# Patient Record
Sex: Female | Born: 1945
Health system: Southern US, Community
[De-identification: ages and names within clinical notes are randomized; demographics above are authoritative.]

## PROBLEM LIST (undated history)

## (undated) DIAGNOSIS — D649 Anemia, unspecified: Secondary | ICD-10-CM

## (undated) DIAGNOSIS — H409 Unspecified glaucoma: Secondary | ICD-10-CM

## (undated) DIAGNOSIS — R519 Headache, unspecified: Secondary | ICD-10-CM

## (undated) DIAGNOSIS — Z87442 Personal history of urinary calculi: Secondary | ICD-10-CM

## (undated) DIAGNOSIS — K219 Gastro-esophageal reflux disease without esophagitis: Secondary | ICD-10-CM

## (undated) DIAGNOSIS — H547 Unspecified visual loss: Secondary | ICD-10-CM

## (undated) DIAGNOSIS — J189 Pneumonia, unspecified organism: Secondary | ICD-10-CM

## (undated) DIAGNOSIS — G35 Multiple sclerosis: Secondary | ICD-10-CM

## (undated) DIAGNOSIS — I499 Cardiac arrhythmia, unspecified: Secondary | ICD-10-CM

## (undated) DIAGNOSIS — R21 Rash and other nonspecific skin eruption: Secondary | ICD-10-CM

## (undated) HISTORY — PX: CATARACT EXTRACTION: SUR2

## (undated) HISTORY — PX: ABDOMINAL HYSTERECTOMY: SHX81

## (undated) HISTORY — PX: KIDNEY STONE SURGERY: SHX686

---

## 1999-03-11 ENCOUNTER — Encounter: Admission: RE | Admit: 1999-03-11 | Discharge: 1999-04-22 | Payer: Self-pay | Admitting: Neurology

## 1999-11-19 ENCOUNTER — Encounter: Payer: Self-pay | Admitting: Obstetrics and Gynecology

## 1999-11-19 ENCOUNTER — Encounter: Admission: RE | Admit: 1999-11-19 | Discharge: 1999-11-19 | Payer: Self-pay | Admitting: Obstetrics and Gynecology

## 2002-02-27 ENCOUNTER — Encounter: Payer: Self-pay | Admitting: Obstetrics and Gynecology

## 2002-02-27 ENCOUNTER — Encounter: Admission: RE | Admit: 2002-02-27 | Discharge: 2002-02-27 | Payer: Self-pay | Admitting: Obstetrics and Gynecology

## 2003-08-20 ENCOUNTER — Encounter: Admission: RE | Admit: 2003-08-20 | Discharge: 2003-08-20 | Payer: Self-pay | Admitting: Internal Medicine

## 2008-03-12 ENCOUNTER — Encounter: Admission: RE | Admit: 2008-03-12 | Discharge: 2008-06-11 | Payer: Self-pay | Admitting: Internal Medicine

## 2009-05-15 ENCOUNTER — Encounter: Admission: RE | Admit: 2009-05-15 | Discharge: 2009-05-15 | Payer: Self-pay | Admitting: Internal Medicine

## 2009-05-16 ENCOUNTER — Encounter: Admission: RE | Admit: 2009-05-16 | Discharge: 2009-05-16 | Payer: Self-pay | Admitting: Internal Medicine

## 2011-07-03 ENCOUNTER — Emergency Department (HOSPITAL_BASED_OUTPATIENT_CLINIC_OR_DEPARTMENT_OTHER)
Admission: EM | Admit: 2011-07-03 | Discharge: 2011-07-03 | Disposition: A | Discharge status: Home / Self Care | Payer: Medicare Other | Attending: Emergency Medicine | Admitting: Emergency Medicine

## 2011-07-03 ENCOUNTER — Emergency Department (INDEPENDENT_AMBULATORY_CARE_PROVIDER_SITE_OTHER): Payer: Medicare Other

## 2011-07-03 ENCOUNTER — Other Ambulatory Visit: Payer: Self-pay

## 2011-07-03 DIAGNOSIS — G35 Multiple sclerosis: Secondary | ICD-10-CM | POA: Insufficient documentation

## 2011-07-03 DIAGNOSIS — M810 Age-related osteoporosis without current pathological fracture: Secondary | ICD-10-CM | POA: Insufficient documentation

## 2011-07-03 DIAGNOSIS — R079 Chest pain, unspecified: Secondary | ICD-10-CM

## 2011-07-03 DIAGNOSIS — I200 Unstable angina: Secondary | ICD-10-CM | POA: Insufficient documentation

## 2011-07-03 DIAGNOSIS — K219 Gastro-esophageal reflux disease without esophagitis: Secondary | ICD-10-CM | POA: Insufficient documentation

## 2011-07-03 HISTORY — DX: Multiple sclerosis: G35

## 2011-07-03 HISTORY — DX: Gastro-esophageal reflux disease without esophagitis: K21.9

## 2011-07-03 LAB — COMPREHENSIVE METABOLIC PANEL
AST: 20 U/L (ref 0–37)
BUN: 13 mg/dL (ref 6–23)
CO2: 29 mEq/L (ref 19–32)
Calcium: 9.6 mg/dL (ref 8.4–10.5)
Chloride: 105 mEq/L (ref 96–112)
Creatinine, Ser: 0.6 mg/dL (ref 0.50–1.10)
GFR calc Af Amer: >90 mL/min (ref >90)
GFR calc non Af Amer: >90 mL/min (ref >90)
Total Bilirubin: 0.2 mg/dL — ABNORMAL LOW (ref 0.3–1.2)

## 2011-07-03 LAB — CBC
HCT: 38.4 % (ref 36.0–46.0)
MCH: 27.8 pg (ref 26.0–34.0)
MCV: 84.6 fL (ref 78.0–100.0)
Platelets: 182 10*3/uL (ref 150–400)
RBC: 4.54 MIL/uL (ref 3.87–5.11)
WBC: 4.7 10*3/uL (ref 4.0–10.5)

## 2011-07-03 LAB — CARDIAC PANEL(CRET KIN+CKTOT+MB+TROPI)
CK, MB: 6.6 ng/mL (ref 0.3–4.0)
Total CK: 221 U/L — ABNORMAL HIGH (ref 7–177)

## 2011-07-03 MED ORDER — NITROGLYCERIN 2 % TD OINT
1.0000 [in_us] | TOPICAL_OINTMENT | Freq: Four times a day (QID) | TRANSDERMAL | Status: DC
  Administered 2011-07-03: 1 [in_us] via TOPICAL
  Filled 2011-07-03: qty 1

## 2011-07-03 MED ORDER — HEPARIN BOLUS VIA INFUSION
4000.0000 [IU] | Freq: Once | INTRAVENOUS | Status: AC
  Administered 2011-07-03: 4000 [IU] via INTRAVENOUS
  Filled 2011-07-03: qty 4000

## 2011-07-03 MED ORDER — HEPARIN (PORCINE) IN NACL 100-0.45 UNIT/ML-% IJ SOLN
14.0000 [IU]/kg/h | INTRAMUSCULAR | Status: DC
  Administered 2011-07-03: 14 [IU]/kg/h via INTRAVENOUS
  Filled 2011-07-03: qty 250

## 2011-07-03 NOTE — ED Notes (Signed)
[facility] here to transport pt to [facility]

## 2011-07-03 NOTE — ED Provider Notes (Signed)
History     CSN: [id number] Arrival date & time: 07/03/2011  5:41 PM   First MD Initiated Contact with Patient 07/03/11 1756      Chief Complaint  Patient presents with  . Chest Pain    (Consider location/radiation/quality/duration/timing/severity/associated sxs/prior treatment) Patient is a 65 y.o. female presenting with chest pain. The history is provided by the patient.  Chest Pain The chest pain began yesterday. Chest pain occurs intermittently. The chest pain is worsening. The pain is associated with exertion. The severity of the pain is moderate. The quality of the pain is described as dull, tightness and heavy. The pain radiates to the left arm and right jaw. Primary symptoms include shortness of breath and nausea. Pertinent negatives for primary symptoms include no fever, no fatigue, no cough, no abdominal pain, no vomiting and no dizziness.  Pertinent negatives for associated symptoms include no diaphoresis, no lower extremity edema, no numbness and no weakness. She tried nothing for the symptoms.  Pertinent negatives for past medical history include no aneurysm, no CAD and no MI.  Procedure history is positive for stress thallium. Procedure history comments: several years ago.     Past Medical History  Diagnosis Date  . Multiple sclerosis   . Osteoporosis   . GERD (gastroesophageal reflux disease)     Past Surgical History  Procedure Date  . Abdominal hysterectomy     No family history on file.  History  Substance Use Topics  . Smoking status: Never Smoker   . Smokeless tobacco: Not on file  . Alcohol Use:     OB History    Grav Para Term Preterm Abortions TAB SAB Ect Mult Living                  Review of Systems  Constitutional: Negative for fever, chills, diaphoresis and fatigue.  HENT: Negative for congestion, rhinorrhea and sneezing.   Eyes: Negative.   Respiratory: Positive for shortness of breath. Negative for cough and chest tightness.     Cardiovascular: Positive for chest pain. Negative for leg swelling.  Gastrointestinal: Positive for nausea. Negative for vomiting, abdominal pain, diarrhea and blood in stool.  Genitourinary: Negative for frequency, hematuria, flank pain and difficulty urinating.  Musculoskeletal: Negative for back pain and arthralgias.  Skin: Negative for rash.  Neurological: Negative for dizziness, speech difficulty, weakness, numbness and headaches.    Allergies  Other and Epinephrine  Home Medications   Current Outpatient Rx  Name Route Sig Dispense Refill  . VITAMIN D 1000 UNITS PO TABS Oral Take 1,000 Units by mouth daily.      . COD LIVER OIL PO Oral Take 1 tablet by mouth daily.      . OXYBUTYNIN CHLORIDE 5 MG PO TABS Oral Take 5 mg by mouth daily.      Marland Kitchen PREDNISOLONE ACETATE 1 % OP SUSP Right Eye Place 1 drop into the right eye 4 (four) times daily.      Marland Kitchen VITAMIN C 500 MG PO TABS Oral Take 500 mg by mouth daily.      Marland Kitchen VITAMIN E 400 UNITS PO CAPS Oral Take 400 Units by mouth daily.        BP 138/68  Pulse 76  Temp(Src) 98 F (36.7 C) (Oral)  Resp 20  Ht 5\' 3"  (1.6 m)  Wt 157 lb (71.215 kg)  BMI 27.81 kg/m2  SpO2 100%  Physical Exam  Constitutional: She is oriented to person, place, and time. She appears well-developed and  well-nourished.  HENT:  Head: Normocephalic and atraumatic.  Eyes: Pupils are equal, round, and reactive to light.  Neck: Normal range of motion. Neck supple.  Cardiovascular: Normal rate, regular rhythm and normal heart sounds.   Pulmonary/Chest: Effort normal and breath sounds normal. No respiratory distress. She has no wheezes. She has no rales. She exhibits no tenderness.  Abdominal: Soft. Bowel sounds are normal. There is no tenderness. There is no rebound and no guarding.  Musculoskeletal: Normal range of motion. She exhibits edema. She exhibits no tenderness.       Mild bilateral pitting edema, at baseline per pt  Lymphadenopathy:    She has no  cervical adenopathy.  Neurological: She is alert and oriented to person, place, and time.  Skin: Skin is warm and dry. No rash noted.  Psychiatric: She has a normal mood and affect.    ED Course  Procedures (including critical care time)  Results for orders placed during the hospital encounter of 07/03/11  CARDIAC PANEL(CRET KIN+CKTOT+MB+TROPI)      Component Value Range   Total CK 221 (*) 7 - 177 (U/L)   CK, MB 6.6 (*) 0.3 - 4.0 (ng/mL)   Troponin I <0.30  <0.30 (ng/mL)   Relative Index 3.0 (*) 0.0 - 2.5   CBC      Component Value Range   WBC 4.7  4.0 - 10.5 (K/uL)   RBC 4.54  3.87 - 5.11 (MIL/uL)   Hemoglobin 12.6  12.0 - 15.0 (g/dL)   HCT 11.9  14.7 - 82.9 (%)   MCV 84.6  78.0 - 100.0 (fL)   MCH 27.8  26.0 - 34.0 (pg)   MCHC 32.8  30.0 - 36.0 (g/dL)   RDW 56.2  13.0 - 86.5 (%)   Platelets 182  150 - 400 (K/uL)  COMPREHENSIVE METABOLIC PANEL      Component Value Range   Sodium 140  135 - 145 (mEq/L)   Potassium 4.1  3.5 - 5.1 (mEq/L)   Chloride 105  96 - 112 (mEq/L)   CO2 29  19 - 32 (mEq/L)   Glucose, Bld 85  70 - 99 (mg/dL)   BUN 13  6 - 23 (mg/dL)   Creatinine, Ser 7.84  0.50 - 1.10 (mg/dL)   Calcium 9.6  8.4 - 69.6 (mg/dL)   Total Protein 7.8  6.0 - 8.3 (g/dL)   Albumin 3.7  3.5 - 5.2 (g/dL)   AST 20  0 - 37 (U/L)   ALT 13  0 - 35 (U/L)   Alkaline Phosphatase 99  39 - 117 (U/L)   Total Bilirubin 0.2 (*) 0.3 - 1.2 (mg/dL)   GFR calc non Af Amer >90  >90 (mL/min)   GFR calc Af Amer >90  >90 (mL/min)   Dg Chest Portable 1 View  07/03/2011  *RADIOLOGY REPORT*  Clinical Data: Chest pain  PORTABLE CHEST - 1 VIEW  Comparison: None.  Findings: Normal mediastinum and cardiac silhouette.  Hiatal hernia present.  No effusion, infiltrate, or pneumothorax.  IMPRESSION: No acute cardiopulmonary process.  Original Report Authenticated By: Genevive Bi, M.D.      1. Unstable angina      Date: 07/03/2011  Rate: 70  Rhythm: normal sinus rhythm  QRS Axis: normal   Intervals: normal  ST/T Wave abnormalities: ST depressions inferiorly, very mild  Conduction Disutrbances:none  Narrative Interpretation:   Old EKG Reviewed: none available    MDM  Pt with chest pain, minor EKG changes, elevated CK, but normal  Tropinin.  Concerning for ACS.  Nothing to suggest PE, aortic injury.   Pt given nitrates, had asa 325mg  about one hour PTA.  Is pain free now. Spoke with cardiologist on call for [facility], will discuss admission to [facility] with hospitalist there.  Pt prefers to go to [facility].  Spoke with Dr. Altha Harm at [facility] who has accept pt for transfer  Rolan Bucco, MD 07/03/11 2015

## 2011-07-03 NOTE — ED Notes (Signed)
C/o CP intermittent since yesterday

## 2012-11-04 ENCOUNTER — Inpatient Hospital Stay (HOSPITAL_BASED_OUTPATIENT_CLINIC_OR_DEPARTMENT_OTHER)
Admission: EM | Admit: 2012-11-04 | Discharge: 2012-11-09 | DRG: 194 | Disposition: A | Discharge status: Skilled Nursing Facility | Payer: Medicare Other | Attending: Internal Medicine | Admitting: Internal Medicine

## 2012-11-04 ENCOUNTER — Emergency Department (HOSPITAL_BASED_OUTPATIENT_CLINIC_OR_DEPARTMENT_OTHER): Payer: Medicare Other

## 2012-11-04 DIAGNOSIS — G35D Multiple sclerosis, unspecified: Secondary | ICD-10-CM | POA: Diagnosis present

## 2012-11-04 DIAGNOSIS — Z8249 Family history of ischemic heart disease and other diseases of the circulatory system: Secondary | ICD-10-CM

## 2012-11-04 DIAGNOSIS — J09X1 Influenza due to identified novel influenza A virus with pneumonia: Principal | ICD-10-CM | POA: Diagnosis present

## 2012-11-04 DIAGNOSIS — J189 Pneumonia, unspecified organism: Secondary | ICD-10-CM | POA: Diagnosis present

## 2012-11-04 DIAGNOSIS — E860 Dehydration: Secondary | ICD-10-CM | POA: Diagnosis present

## 2012-11-04 DIAGNOSIS — J209 Acute bronchitis, unspecified: Secondary | ICD-10-CM | POA: Diagnosis present

## 2012-11-04 DIAGNOSIS — K449 Diaphragmatic hernia without obstruction or gangrene: Secondary | ICD-10-CM | POA: Diagnosis present

## 2012-11-04 DIAGNOSIS — J101 Influenza due to other identified influenza virus with other respiratory manifestations: Secondary | ICD-10-CM | POA: Diagnosis present

## 2012-11-04 DIAGNOSIS — Z833 Family history of diabetes mellitus: Secondary | ICD-10-CM

## 2012-11-04 DIAGNOSIS — G35 Multiple sclerosis: Secondary | ICD-10-CM | POA: Diagnosis present

## 2012-11-04 DIAGNOSIS — R0602 Shortness of breath: Secondary | ICD-10-CM | POA: Diagnosis present

## 2012-11-04 DIAGNOSIS — Z809 Family history of malignant neoplasm, unspecified: Secondary | ICD-10-CM

## 2012-11-04 DIAGNOSIS — K920 Hematemesis: Secondary | ICD-10-CM | POA: Diagnosis present

## 2012-11-04 DIAGNOSIS — K5900 Constipation, unspecified: Secondary | ICD-10-CM | POA: Diagnosis present

## 2012-11-04 DIAGNOSIS — K219 Gastro-esophageal reflux disease without esophagitis: Secondary | ICD-10-CM | POA: Diagnosis present

## 2012-11-04 DIAGNOSIS — M810 Age-related osteoporosis without current pathological fracture: Secondary | ICD-10-CM | POA: Diagnosis present

## 2012-11-04 DIAGNOSIS — R0902 Hypoxemia: Secondary | ICD-10-CM | POA: Diagnosis present

## 2012-11-04 DIAGNOSIS — K2970 Gastritis, unspecified, without bleeding: Secondary | ICD-10-CM | POA: Diagnosis present

## 2012-11-04 LAB — POCT I-STAT 3, ART BLOOD GAS (G3+)
Acid-Base Excess: 2 mmol/L (ref 0.0–2.0)
Bicarbonate: 25.5 mEq/L — ABNORMAL HIGH (ref 20.0–24.0)
O2 Saturation: 93 %
Patient temperature: 100.4
pO2, Arterial: 65 mmHg — ABNORMAL LOW (ref 80.0–100.0)

## 2012-11-04 LAB — CBC WITH DIFFERENTIAL/PLATELET
Eosinophils Absolute: 0.1 10*3/uL (ref 0.0–0.7)
Hemoglobin: 14 g/dL (ref 12.0–15.0)
Lymphocytes Relative: 9 % — ABNORMAL LOW (ref 12–46)
Lymphs Abs: 0.7 10*3/uL (ref 0.7–4.0)
MCH: 29 pg (ref 26.0–34.0)
Monocytes Relative: 7 % (ref 3–12)
Neutro Abs: 6.3 10*3/uL (ref 1.7–7.7)
Neutrophils Relative %: 83 % — ABNORMAL HIGH (ref 43–77)
Platelets: 151 10*3/uL (ref 150–400)
RBC: 4.83 MIL/uL (ref 3.87–5.11)
WBC: 7.6 10*3/uL (ref 4.0–10.5)

## 2012-11-04 LAB — COMPREHENSIVE METABOLIC PANEL
ALT: 13 U/L (ref 0–35)
Alkaline Phosphatase: 112 U/L (ref 39–117)
BUN: 14 mg/dL (ref 6–23)
Chloride: 105 mEq/L (ref 96–112)
GFR calc Af Amer: >90 mL/min (ref >90)
Glucose, Bld: 118 mg/dL — ABNORMAL HIGH (ref 70–99)
Potassium: 4.3 mEq/L (ref 3.5–5.1)
Sodium: 140 mEq/L (ref 135–145)
Total Bilirubin: 0.4 mg/dL (ref 0.3–1.2)
Total Protein: 8.2 g/dL (ref 6.0–8.3)

## 2012-11-04 LAB — URINALYSIS, ROUTINE W REFLEX MICROSCOPIC
Bilirubin Urine: NEGATIVE
Glucose, UA: NEGATIVE mg/dL
Ketones, ur: NEGATIVE mg/dL
Nitrite: NEGATIVE
Protein, ur: NEGATIVE mg/dL
pH: 6.5 (ref 5.0–8.0)

## 2012-11-04 MED ORDER — SODIUM CHLORIDE 0.9 % IV SOLN
Freq: Once | INTRAVENOUS | Status: AC
  Administered 2012-11-04: 23:00:00 via INTRAVENOUS

## 2012-11-04 MED ORDER — DEXTROSE 5 % IV SOLN
1.0000 g | Freq: Once | INTRAVENOUS | Status: AC
  Administered 2012-11-04: 1 g via INTRAVENOUS
  Filled 2012-11-04: qty 10

## 2012-11-04 MED ORDER — ALBUTEROL SULFATE (5 MG/ML) 0.5% IN NEBU
5.0000 mg | INHALATION_SOLUTION | Freq: Once | RESPIRATORY_TRACT | Status: AC
  Administered 2012-11-04: 5 mg via RESPIRATORY_TRACT
  Filled 2012-11-04: qty 1

## 2012-11-04 MED ORDER — IPRATROPIUM BROMIDE 0.02 % IN SOLN
0.5000 mg | Freq: Once | RESPIRATORY_TRACT | Status: AC
  Administered 2012-11-04: 0.5 mg via RESPIRATORY_TRACT
  Filled 2012-11-04: qty 2.5

## 2012-11-04 MED ORDER — DEXTROSE 5 % IV SOLN
500.0000 mg | Freq: Once | INTRAVENOUS | Status: AC
  Administered 2012-11-05: 500 mg via INTRAVENOUS
  Filled 2012-11-04: qty 500

## 2012-11-04 NOTE — ED Notes (Signed)
MD at bedside. 

## 2012-11-04 NOTE — ED Provider Notes (Addendum)
History     CSN: [id number]  Arrival date & time 11/04/12  1842   First MD Initiated Contact with Patient 11/04/12 1858      Chief Complaint  Patient presents with  . Fever  . Fatigue  . Shortness of Breath    (Consider location/radiation/quality/duration/timing/severity/associated sxs/prior treatment) HPI  [name] with ms and able to get up only with assistance.  Patient with cough and sob for greater 24 hours.  Patient had flu vaccine in January and pneumonia vaccine on Monday.  She follows with Dr. Dennis Bast.  [facility]. Patient states last hospitalization at [facility].  Patient with fever at home to 102 and took ibuprofen prior to ems coming.  Patient given tylenol prehospital by ems.    Past Medical History  Diagnosis Date  . Multiple sclerosis   . Osteoporosis   . GERD (gastroesophageal reflux disease)     Past Surgical History  Procedure Laterality Date  . Abdominal hysterectomy      No family history on file.  History  Substance Use Topics  . Smoking status: Never Smoker   . Smokeless tobacco: Not on file  . Alcohol Use:     OB History   Grav Para Term Preterm Abortions TAB SAB Ect Mult Living                  Review of Systems  Constitutional: Positive for fever, chills and appetite change.  HENT: Positive for congestion.   Eyes: Negative.   Respiratory: Positive for cough and shortness of breath.   Cardiovascular: Positive for leg swelling.  Gastrointestinal: Negative.   Endocrine: Negative.   Genitourinary: Negative.   Neurological: Negative.   Hematological: Negative.   Psychiatric/Behavioral: Negative.     Allergies  Other and Epinephrine  Home Medications   Current Outpatient Rx  Name  Route  Sig  Dispense  Refill  . acetaminophen (TYLENOL) 325 MG tablet   Oral   Take 650 mg by mouth every 6 (six) hours as needed for pain.         . cholecalciferol (VITAMIN D) 1000 UNITS tablet   Oral   Take 1,000 Units by mouth  daily.           . Homeopathic Products (ZICAM COLD REMEDY PO)   Oral   Take by mouth.         Marland Kitchen ibuprofen (ADVIL,MOTRIN) 200 MG tablet   Oral   Take 200 mg by mouth every 6 (six) hours as needed for pain.         . COD LIVER OIL PO   Oral   Take 1 tablet by mouth daily.           Marland Kitchen oxybutynin (DITROPAN) 5 MG tablet   Oral   Take 5 mg by mouth daily.           . prednisoLONE acetate (PRED FORTE) 1 % ophthalmic suspension   Right Eye   Place 1 drop into the right eye 4 (four) times daily.           . vitamin C (ASCORBIC ACID) 500 MG tablet   Oral   Take 500 mg by mouth daily.           . vitamin E 400 UNIT capsule   Oral   Take 400 Units by mouth daily.             BP 166/78  Pulse 106  Temp(Src) 100.4 F (38 C) (  Oral)  Resp 24  Ht 5\' 3"  (1.6 m)  Wt 165 lb (74.844 kg)  BMI 29.24 kg/m2  SpO2 94%  Physical Exam  Nursing note and vitals reviewed. Constitutional: She is oriented to person, place, and time. She appears well-developed and well-nourished.  HENT:  Head: Normocephalic and atraumatic.  Mouth/Throat: Mucous membranes are dry.  Eyes: Conjunctivae are normal. Pupils are equal, round, and reactive to light.  Neck: Normal range of motion. Neck supple.  Cardiovascular: Tachycardia present.   Pulmonary/Chest: She has decreased breath sounds.  Abdominal: Soft. Bowel sounds are normal.  Musculoskeletal: Normal range of motion.  Neurological: She is alert and oriented to person, place, and time.  Skin: Skin is warm and dry.  Psychiatric: She has a normal mood and affect.    ED Course  Procedures (including critical care time)  Labs Reviewed  CBC WITH DIFFERENTIAL - Abnormal; Notable for the following:    Neutrophils Relative 83 (*)    Lymphocytes Relative 9 (*)    All other components within normal limits  COMPREHENSIVE METABOLIC PANEL - Abnormal; Notable for the following:    Glucose, Bld 118 (*)    GFR calc non Af Amer 88 (*)    All  other components within normal limits  POCT I-STAT 3, BLOOD GAS (G3+) - Abnormal; Notable for the following:    pH, Arterial 7.473 (*)    pO2, Arterial 65.0 (*)    Bicarbonate 25.5 (*)    All other components within normal limits  URINALYSIS, ROUTINE W REFLEX MICROSCOPIC   Dg Chest Port 1 View  11/04/2012  *RADIOLOGY REPORT*  Clinical Data: Cough, chills, chest tightness, shortness of breath.  PORTABLE CHEST - 1 VIEW  Comparison: 07/03/2011  Findings: Normal heart size and pulmonary vascularity.  Linear atelectasis or infiltration in both lung bases.  This is new since the previous study.  No focal airspace consolidation.  No blunting of costophrenic angles.  No pneumothorax.  Mediastinal contours are intact.  Tortuous aorta.  IMPRESSION: Developing linear infiltration or atelectasis in both lung bases.   Original Report Authenticated By: Burman Nieves, M.D.      No diagnosis found.    Patient with ms and temp to 102 at home.  Sats 87% on arrival with adequate oxygenation after resting in bed.  Patient without recent pneumonia treatment or hospitalization and rocephin and vancomycin ordered.  Patient with po2 of 65.  Her oxygen saturations are 93% at rest.  Infiltrates are linear and patient appears stable without signs or symptoms of infiltrates.        Hilario Quarry, MD 11/04/12 2319  Discussed with Dr. Lovell Sheehan and plan admission to med surg bed for cap.   Hilario Quarry, MD 11/05/12 3086  Hilario Quarry, MD 12/04/12 [phone]

## 2012-11-04 NOTE — ED Notes (Signed)
Pt c/o fever, chills, SHOB and generalized fatigue since yesterday.  Tylenol 1 gram PO administered by EMS for temp of 100.3

## 2012-11-05 ENCOUNTER — Inpatient Hospital Stay (HOSPITAL_COMMUNITY): Payer: Medicare Other

## 2012-11-05 DIAGNOSIS — R0602 Shortness of breath: Secondary | ICD-10-CM | POA: Diagnosis present

## 2012-11-05 LAB — CBC
HCT: 35.1 % — ABNORMAL LOW (ref 36.0–46.0)
Hemoglobin: 12 g/dL (ref 12.0–15.0)
MCH: 28.7 pg (ref 26.0–34.0)
MCHC: 34.2 g/dL (ref 30.0–36.0)
MCV: 84 fL (ref 78.0–100.0)
RDW: 14.6 % (ref 11.5–15.5)

## 2012-11-05 LAB — OCCULT BLOOD GASTRIC / DUODENUM (SPECIMEN CUP): Occult Blood, Gastric: POSITIVE — AB

## 2012-11-05 LAB — INFLUENZA PANEL BY PCR (TYPE A & B): Influenza A By PCR: POSITIVE — AB

## 2012-11-05 MED ORDER — OXYCODONE HCL 5 MG PO TABS
5.0000 mg | ORAL_TABLET | ORAL | Status: DC | PRN
  Administered 2012-11-07: 5 mg via ORAL
  Filled 2012-11-05: qty 1

## 2012-11-05 MED ORDER — ACETAMINOPHEN 650 MG RE SUPP: 650.0000 mg | Freq: Four times a day (QID) | RECTAL | Status: DC | PRN

## 2012-11-05 MED ORDER — ACETAMINOPHEN 325 MG PO TABS
650.0000 mg | ORAL_TABLET | Freq: Four times a day (QID) | ORAL | Status: DC | PRN
  Administered 2012-11-05 (×2): 650 mg via ORAL
  Filled 2012-11-05: qty 1
  Filled 2012-11-05 (×3): qty 2

## 2012-11-05 MED ORDER — ENOXAPARIN SODIUM 40 MG/0.4ML ~~LOC~~ SOLN
40.0000 mg | SUBCUTANEOUS | Status: DC
  Administered 2012-11-05 – 2012-11-09 (×5): 40 mg via SUBCUTANEOUS
  Filled 2012-11-05 (×5): qty 0.4

## 2012-11-05 MED ORDER — GUAIFENESIN ER 600 MG PO TB12
1200.0000 mg | ORAL_TABLET | Freq: Two times a day (BID) | ORAL | Status: DC
  Administered 2012-11-05 – 2012-11-09 (×8): 1200 mg via ORAL
  Filled 2012-11-05 (×10): qty 2

## 2012-11-05 MED ORDER — ALBUTEROL SULFATE (5 MG/ML) 0.5% IN NEBU
2.5000 mg | INHALATION_SOLUTION | RESPIRATORY_TRACT | Status: DC | PRN
  Administered 2012-11-05: 2.5 mg via RESPIRATORY_TRACT
  Filled 2012-11-05: qty 0.5

## 2012-11-05 MED ORDER — DEXTROSE 5 % IV SOLN
500.0000 mg | INTRAVENOUS | Status: DC
  Administered 2012-11-05: 500 mg via INTRAVENOUS
  Filled 2012-11-05 (×3): qty 500

## 2012-11-05 MED ORDER — PANTOPRAZOLE SODIUM 40 MG IV SOLR
40.0000 mg | Freq: Two times a day (BID) | INTRAVENOUS | Status: DC
  Administered 2012-11-05 – 2012-11-06 (×3): 40 mg via INTRAVENOUS
  Filled 2012-11-05 (×4): qty 40

## 2012-11-05 MED ORDER — ONDANSETRON HCL 4 MG PO TABS: 4.0000 mg | ORAL_TABLET | Freq: Four times a day (QID) | ORAL | Status: DC | PRN

## 2012-11-05 MED ORDER — ZOLPIDEM TARTRATE 5 MG PO TABS
5.0000 mg | ORAL_TABLET | Freq: Every evening | ORAL | Status: DC | PRN
  Administered 2012-11-06: 5 mg via ORAL
  Filled 2012-11-05: qty 1

## 2012-11-05 MED ORDER — HYDROCOD POLST-CHLORPHEN POLST 10-8 MG/5ML PO LQCR: 5.0000 mL | Freq: Two times a day (BID) | ORAL | Status: DC | PRN

## 2012-11-05 MED ORDER — DEXTROSE 5 % IV SOLN
1.0000 g | INTRAVENOUS | Status: DC
  Administered 2012-11-05 – 2012-11-07 (×2): 1 g via INTRAVENOUS
  Filled 2012-11-05 (×5): qty 10

## 2012-11-05 MED ORDER — SODIUM CHLORIDE 0.9 % IV SOLN
INTRAVENOUS | Status: DC
  Administered 2012-11-05 – 2012-11-07 (×3): via INTRAVENOUS

## 2012-11-05 MED ORDER — ONDANSETRON HCL 4 MG/2ML IJ SOLN
4.0000 mg | Freq: Four times a day (QID) | INTRAMUSCULAR | Status: DC | PRN
  Filled 2012-11-05: qty 2

## 2012-11-05 MED ORDER — ACETAMINOPHEN 325 MG PO TABS
650.0000 mg | ORAL_TABLET | Freq: Once | ORAL | Status: AC
  Administered 2012-11-05: 650 mg via ORAL
  Filled 2012-11-05: qty 2

## 2012-11-05 MED ORDER — OSELTAMIVIR PHOSPHATE 75 MG PO CAPS
75.0000 mg | ORAL_CAPSULE | Freq: Two times a day (BID) | ORAL | Status: DC
  Administered 2012-11-05 – 2012-11-08 (×7): 75 mg via ORAL
  Filled 2012-11-05 (×8): qty 1

## 2012-11-05 MED ORDER — SODIUM CHLORIDE 0.9 % IV SOLN: INTRAVENOUS | Status: AC

## 2012-11-05 MED ORDER — HYDROMORPHONE HCL PF 1 MG/ML IJ SOLN
0.5000 mg | INTRAMUSCULAR | Status: DC | PRN
Start: 2012-11-05 — End: 2012-11-09

## 2012-11-05 MED ORDER — ALUM & MAG HYDROXIDE-SIMETH 200-200-20 MG/5ML PO SUSP: 30.0000 mL | Freq: Four times a day (QID) | ORAL | Status: DC | PRN

## 2012-11-05 MED ORDER — ALBUTEROL SULFATE (5 MG/ML) 0.5% IN NEBU
2.5000 mg | INHALATION_SOLUTION | Freq: Four times a day (QID) | RESPIRATORY_TRACT | Status: DC
  Administered 2012-11-05 (×2): 2.5 mg via RESPIRATORY_TRACT
  Filled 2012-11-05 (×2): qty 0.5

## 2012-11-05 NOTE — Progress Notes (Signed)
Report received from Joss, Charity fundraiser. Awaiting patients arrival.

## 2012-11-05 NOTE — Progress Notes (Signed)
[facility] PROGRESS NOTE  Heather Lutz NFA:[id number] DOB: December 29, 1945 DOA: 11/04/2012 PCP: Dennis Bast, MD  HPI/Subjective: Vomited while on interviewing her, husband is at bedside.    Assessment/Plan:  Acute bronchitis -Versus early pneumonia, patient came in with cough, fever and chills and shortness of breath. -Chest x-ray did not show infiltrates but showed bronchitic changes. -Started on IV antibiotics including Rocephin and azithromycin. -Supportive management with inhaled bronchodilators, mucolytics, expectorants and oxygen as needed. -Empirically started on Tamiflu, flu PCR is pending.  Nausea and vomiting -Check vomitus for gastric occult blood, check KUB. -Vomitus contains dark brown spots almost look like coffee grounds.  Hypoxemia  -Secondary to the acute bronchitis, when necessary oxygen and continuous pulse ox.  Multiple sclerosis -Stable, no changes, continue patient routine. Patient is to use walker and assist to ambulate.  Code Status: Full code Family Communication: Plan discussed with the patient and her husband at bedside. Disposition Plan: Inpatient   Consultants:  None  Procedures:  None  Antibiotics:  Levaquin    Objective: Filed Vitals:   11/05/12 0255 11/05/12 0415 11/05/12 0552 11/05/12 0837  BP: 154/72 148/74    Pulse: 123 118    Temp: 101.7 F (38.7 C) 102.8 F (39.3 C) 101.9 F (38.8 C)   TempSrc: Axillary Oral    Resp: 18 20    Height:  5\' 3"  (1.6 m)    Weight:  75 kg (165 lb 5.5 oz)    SpO2: 95% 97%  100%   No intake or output data in the 24 hours ending 11/05/12 1025 Filed Weights   11/04/12 1850 11/05/12 0415  Weight: 74.844 kg (165 lb) 75 kg (165 lb 5.5 oz)    Exam:  General: Alert and awake, oriented x3, not in any acute distress. HEENT: anicteric sclera, pupils reactive to light and accommodation, EOMI CVS: S1-S2 clear, no murmur rubs or gallops Chest: clear to auscultation bilaterally, no  wheezing, rales or rhonchi Abdomen: soft nontender, nondistended, normal bowel sounds, no organomegaly Extremities: no cyanosis, clubbing or edema noted bilaterally Neuro: Cranial nerves II-XII intact, no focal neurological deficits  Data Reviewed: Basic Metabolic Panel:  Recent Labs Lab 11/04/12 2115  NA 140  K 4.3  CL 105  CO2 23  GLUCOSE 118*  BUN 14  CREATININE 0.70  CALCIUM 9.8   Liver Function Tests:  Recent Labs Lab 11/04/12 2115  AST 26  ALT 13  ALKPHOS 112  BILITOT 0.4  PROT 8.2  ALBUMIN 3.8   No results found for this basename: LIPASE, AMYLASE,  in the last 168 hours No results found for this basename: AMMONIA,  in the last 168 hours CBC:  Recent Labs Lab 11/04/12 2115  WBC 7.6  NEUTROABS 6.3  HGB 14.0  HCT 41.3  MCV 85.5  PLT 151   Cardiac Enzymes: No results found for this basename: CKTOTAL, CKMB, CKMBINDEX, TROPONINI,  in the last 168 hours BNP (last 3 results) No results found for this basename: PROBNP,  in the last 8760 hours CBG: No results found for this basename: GLUCAP,  in the last 168 hours  No results found for this or any previous visit (from the past 240 hour(s)).   Studies: Dg Abd 1 View  11/05/2012  *RADIOLOGY REPORT*  Clinical Data: Right upper quadrant pain.  ABDOMEN - 1 VIEW  Comparison: None.  Findings: Supine view of the abdomen was obtained.  There is a nonspecific bowel gas pattern.  Few gas-filled loops of bowel in the abdomen.  No  significant small bowel dilatation. Calcifications in the pelvis are suggestive for phleboliths.  High density focus in the right upper quadrant is probably associated with bowel or stool but cannot exclude a calcification in this area.  IMPRESSION: Nonspecific bowel gas pattern.  Difficult to exclude right upper quadrant calcifications, as described.   Original Report Authenticated By: Richarda Overlie, M.D.    Dg Chest Port 1 View  11/04/2012  *RADIOLOGY REPORT*  Clinical Data: Cough, chills, chest  tightness, shortness of breath.  PORTABLE CHEST - 1 VIEW  Comparison: 07/03/2011  Findings: Normal heart size and pulmonary vascularity.  Linear atelectasis or infiltration in both lung bases.  This is new since the previous study.  No focal airspace consolidation.  No blunting of costophrenic angles.  No pneumothorax.  Mediastinal contours are intact.  Tortuous aorta.  IMPRESSION: Developing linear infiltration or atelectasis in both lung bases.   Original Report Authenticated By: Burman Nieves, M.D.     Scheduled Meds: . sodium chloride   Intravenous STAT  . albuterol  2.5 mg Nebulization Q6H  . enoxaparin (LOVENOX) injection  40 mg Subcutaneous Q24H  . guaiFENesin  1,200 mg Oral BID  . oseltamivir  75 mg Oral BID   Continuous Infusions: . sodium chloride 75 mL/hr at 11/05/12 8469    Principal Problem:   CAP (community acquired pneumonia) Active Problems:   Hypoxemia   SOB (shortness of breath)   Multiple sclerosis    Time spent: 35 minutes    [facility]  [facility] Pager [phone]. If 8PM-8AM, please contact night-coverage at [email or web address], password [facility] 11/05/2012, 10:25 AM  LOS: 1 day

## 2012-11-05 NOTE — Progress Notes (Signed)
Pt admitted to the unit. Pt is alert and oriented. Pt oriented to room, staff, and call bell. Bed in lowest position. Full assessment to Epic. Call bell with in reach. Told to call for assists. Will continue to monitor.  Roshell Brigham E  

## 2012-11-05 NOTE — H&P (Signed)
[facility] History and Physical  Heather Lutz ZOX:[id number] DOB: 1945-12-10 DOA: 11/04/2012  Referring physician: EDP at [facility] PCP: Dennis Bast, MD  Specialists:   Chief Complaint: Fever chills cough and SOB  HPI: Heather Lutz is a 67 y.o. female with a history of Multiple Sclerosis since 1986 who presented to the ED at [facility] with complaints of  Fevers, chills, cough and SOB X 1 day.  She has had difficulty coughing up her sputum.  In the ED she was found to have hypoxemia with O2 saturations of 87%, which did improve with resting.   She has had a poor appetite over the past 24 hours.   Her husband reports that she had a pneumovax and antibiotics for a UTI that was prescribed by their PCP 5 days ago.     Review of Systems: The patient denies  weight loss, vision loss, decreased hearing, hoarseness, chest pain, syncope, balance deficits, hemoptysis, abdominal pain, nausea, vomiting, diarrhea, constipation, melena, hematochezia, severe indigestion/heartburn, hematuria, incontinence,  suspicious skin lesions, transient blindness, depression, unusual weight change, abnormal bleeding, enlarged lymph nodes, angioedema, and breast masses.      Past Medical History  Diagnosis Date  . Multiple sclerosis   . Osteoporosis   . GERD (gastroesophageal reflux disease)      Past Surgical History  Procedure Laterality Date  . Abdominal hysterectomy      Medications:  HOME MEDS: Prior to Admission medications   Medication Sig Start Date End Date Taking? Authorizing Provider  acetaminophen (TYLENOL) 325 MG tablet Take 650 mg by mouth every 6 (six) hours as needed for pain.   Yes Historical Provider, MD  cholecalciferol (VITAMIN D) 1000 UNITS tablet Take 1,000 Units by mouth daily.     Yes Historical Provider, MD  Homeopathic Products [facility] COLD REMEDY PO) Take by mouth.   Yes Historical Provider, MD  ibuprofen (ADVIL,MOTRIN) 200 MG tablet Take 200 mg by mouth every 6 (six)  hours as needed for pain.   Yes Historical Provider, MD  COD LIVER OIL PO Take 1 tablet by mouth daily.      Historical Provider, MD  oxybutynin (DITROPAN) 5 MG tablet Take 5 mg by mouth daily.      Historical Provider, MD  prednisoLONE acetate (PRED FORTE) 1 % ophthalmic suspension Place 1 drop into the right eye 4 (four) times daily.      Historical Provider, MD  vitamin C (ASCORBIC ACID) 500 MG tablet Take 500 mg by mouth daily.      Historical Provider, MD  vitamin E 400 UNIT capsule Take 400 Units by mouth daily.      Historical Provider, MD    Allergies:  Allergies  Allergen Reactions  . Ace Inhibitors   . Other Itching    Walnuts, honeydew, watermelon, catanlope, bananas  . Epinephrine Anxiety    Hallucinations      Social History:  Married  reports that she has never smoked. She does not have any smokeless tobacco history on file.  She does not drink Alcohol, and does not use Illicit Drugs.       Family History:       CAD in Father and Brother X1      HTN in Mother      Diabetes in Mother and Brother X1      Cancer in Father -Prostate Cancer                   In Brother X1 with Colon Cancer  Physical Exam:  GEN:  Pleasant 67 year old Obese African American Female examined  and in no acute distress; cooperative with exam Filed Vitals:   11/04/12 2040 11/05/12 0052 11/05/12 0255 11/05/12 0415  BP:  129/67 154/72 148/74  Pulse:  115 123 118  Temp:  101.6 F (38.7 C) 101.7 F (38.7 C) 102.8 F (39.3 C)  TempSrc:  Oral Axillary Oral  Resp:  16 18 20   Height:    5\' 3"  (1.6 m)  Weight:    75 kg (165 lb 5.5 oz)  SpO2: 94% 97% 95% 97%   Blood pressure 148/74, pulse 118, temperature 102.8 F (39.3 C), temperature source Oral, resp. rate 20, height 5\' 3"  (1.6 m), weight 75 kg (165 lb 5.5 oz), SpO2 97.00%. PSYCH: She is alert and oriented x4; does not appear anxious does not appear depressed; affect is normal HEENT: Normocephalic and Atraumatic, Mucous membranes  pink; PERRLA; EOM intact; Fundi:  Benign;  No scleral icterus, Nares: Patent, Oropharynx: Clear, Edentulous (Dentures Present), Neck:  FROM, no cervical lymphadenopathy nor thyromegaly or carotid bruit; no JVD; Breasts:: Not examined CHEST WALL: No tenderness CHEST: Normal respiration, clear to auscultation bilaterally HEART: Regular rate and rhythm; no murmurs rubs or gallops BACK: No kyphosis or scoliosis; no CVA tenderness ABDOMEN: Positive Bowel Sounds, Scaphoid, Obese, soft non-tender; no masses, no organomegaly, no pannus; no intertriginous candida. Rectal Exam: Not done EXTREMITIES: No bone or joint deformity; age-appropriate arthropathy of the hands and knees; no cyanosis, clubbing or edema; no ulcerations. Genitalia: not examined PULSES: 2+ and symmetric SKIN: Normal hydration no rash or ulceration CNS: Cranial nerves 2-12 grossly intact no focal neurologic deficit     Labs on Admission:  Basic Metabolic Panel:  Recent Labs Lab 11/04/12 2115  NA 140  K 4.3  CL 105  CO2 23  GLUCOSE 118*  BUN 14  CREATININE 0.70  CALCIUM 9.8   Liver Function Tests:  Recent Labs Lab 11/04/12 2115  AST 26  ALT 13  ALKPHOS 112  BILITOT 0.4  PROT 8.2  ALBUMIN 3.8   No results found for this basename: LIPASE, AMYLASE,  in the last 168 hours No results found for this basename: AMMONIA,  in the last 168 hours CBC:  Recent Labs Lab 11/04/12 2115  WBC 7.6  NEUTROABS 6.3  HGB 14.0  HCT 41.3  MCV 85.5  PLT 151   Cardiac Enzymes: No results found for this basename: CKTOTAL, CKMB, CKMBINDEX, TROPONINI,  in the last 168 hours  BNP (last 3 results) No results found for this basename: PROBNP,  in the last 8760 hours CBG: No results found for this basename: GLUCAP,  in the last 168 hours  Radiological Exams on Admission: Dg Chest Port 1 View  11/04/2012  *RADIOLOGY REPORT*  Clinical Data: Cough, chills, chest tightness, shortness of breath.  PORTABLE CHEST - 1 VIEW   Comparison: 07/03/2011  Findings: Normal heart size and pulmonary vascularity.  Linear atelectasis or infiltration in both lung bases.  This is new since the previous study.  No focal airspace consolidation.  No blunting of costophrenic angles.  No pneumothorax.  Mediastinal contours are intact.  Tortuous aorta.  IMPRESSION: Developing linear infiltration or atelectasis in both lung bases.   Original Report Authenticated By: Burman Nieves, M.D.      Assessment/Plan Principal Problem:   CAP (community acquired pneumonia) Active Problems:   Hypoxemia   SOB (shortness of breath)   Multiple sclerosis    1.   CAP-  Admitted to Med/surg BED, placed on Droplet precautions, and Flu PCR sent.  Placed on IV Rocephin and Azithromycin to cover CAP, and Tamiflu empirically to cover FLU.  Albuterol Nebulizer Rxs ordered, and Mucinex BID, and Tussionex q 12hrs PRN.                               Also IVFS for rehydration.   2.   Hypoxemia-   Due to #1, PRN O2 ordered and continuous Pulse Oximetry.    3.   SOB- due to #1, and treated as #2.    4.   Multiple Sclerosis-  Stable.     5.   Other-  Reconcile Home Medications, and DVT prophylaxis of Lovenox ordered.       Code Status:  FULL CODE Family Communication:  Husband at Bedside Disposition Plan:  Return to Home on Discharge  Time spent: 92 Minutes   Ron Parker [facility] Pager [phone]  If 7PM-7AM, please contact night-coverage [email or web address] Password [facility] 11/05/2012, 5:39 AM

## 2012-11-05 NOTE — Consult Note (Signed)
Referring Provider: Dr. Arthor Captain Primary Care Physician:  Dennis Bast, MD Primary Gastroenterologist:  Gentry Fitz  Reason for Consultation:  Hematemesis (coffee grounds emesis)  HPI: Heather Lutz is a 67 y.o. female admitted for workup for cough, fever, chills, and shortness of breath who has since tested positive for H1N1 influenza had the acute onset of vomiting that was black (coffee grounds appearance) when it came up. Reports occasional right-sided abdominal pain. Chronic heartburn that she takes Nexium for. History of EGD with dilation in 2011 per her report but denies that a stricture was seen. Personal history of colon polyps on colonoscopy in 2011 and reports both procedures done in [facility]  Past Medical History  Diagnosis Date  . Multiple sclerosis   . Osteoporosis   . GERD (gastroesophageal reflux disease)     Past Surgical History  Procedure Laterality Date  . Abdominal hysterectomy      Prior to Admission medications   Medication Sig Start Date End Date Taking? Authorizing Provider  acetaminophen (TYLENOL) 325 MG tablet Take 650 mg by mouth every 6 (six) hours as needed for pain.   Yes Historical Provider, MD  Homeopathic Products [facility] COLD REMEDY PO) Take by mouth.   Yes Historical Provider, MD  ibuprofen (ADVIL,MOTRIN) 200 MG tablet Take 200 mg by mouth every 6 (six) hours as needed for pain.   Yes Historical Provider, MD  nitrofurantoin (MACRODANTIN) 50 MG capsule Take 50 mg by mouth 4 (four) times daily.   Yes Historical Provider, MD  Vitamin D, Ergocalciferol, (DRISDOL) 50000 UNITS CAPS Take 50,000 Units by mouth every 7 (seven) days. Patient takes this medication on Tuesday and Thursday.   Yes Historical Provider, MD  COD LIVER OIL PO Take 1 tablet by mouth daily.      Historical Provider, MD  oxybutynin (DITROPAN) 5 MG tablet Take 5 mg by mouth daily.      Historical Provider, MD    Scheduled Meds: . sodium chloride   Intravenous STAT  . albuterol   2.5 mg Nebulization Q6H  . azithromycin  500 mg Intravenous Q24H  . cefTRIAXone (ROCEPHIN)  IV  1 g Intravenous Q24H  . enoxaparin (LOVENOX) injection  40 mg Subcutaneous Q24H  . guaiFENesin  1,200 mg Oral BID  . oseltamivir  75 mg Oral BID  . pantoprazole (PROTONIX) IV  40 mg Intravenous Q12H   Continuous Infusions: . sodium chloride 75 mL/hr at 11/05/12 0621   PRN Meds:.acetaminophen, acetaminophen, albuterol, alum & mag hydroxide-simeth, chlorpheniramine-HYDROcodone, HYDROmorphone (DILAUDID) injection, ondansetron (ZOFRAN) IV, ondansetron, oxyCODONE, zolpidem  Allergies as of 11/04/2012 - Review Complete 07/03/2011  Allergen Reaction Noted  . Other Itching 07/03/2011  . Epinephrine Anxiety 07/03/2011    No family history on file.  History   Social History  . Marital Status: Married    Spouse Name: N/A    Number of Children: N/A  . Years of Education: N/A   Occupational History  . Not on file.   Social History Main Topics  . Smoking status: Never Smoker   . Smokeless tobacco: Not on file  . Alcohol Use:   . Drug Use:   . Sexually Active:    Other Topics Concern  . Not on file   Social History Narrative  . No narrative on file    Review of Systems: All negative except as stated above in HPI.  Physical Exam: Vital signs: Filed Vitals:   11/05/12 1400  BP: 125/75  Pulse: 92  Temp: 98.6 F (37 C)  Resp: 20   Last BM Date: 10/29/12 General:   lethargic,  Well-developed, well-nourished, pleasant and cooperative in NAD Lungs:  Clear throughout to auscultation.   No wheezes, crackles, or rhonchi. No acute distress. Heart:  Regular rate and rhythm; no murmurs, clicks, rubs,  or gallops. Abdomen: soft, nontender, nondistended  Rectal:  Deferred  GI:  Lab Results:  Recent Labs  11/04/12 2115  WBC 7.6  HGB 14.0  HCT 41.3  PLT 151   BMET  Recent Labs  11/04/12 2115  NA 140  K 4.3  CL 105  CO2 23  GLUCOSE 118*  BUN 14  CREATININE 0.70   CALCIUM 9.8   LFT  Recent Labs  11/04/12 2115  PROT 8.2  ALBUMIN 3.8  AST 26  ALT 13  ALKPHOS 112  BILITOT 0.4   PT/INR No results found for this basename: LABPROT, INR,  in the last 72 hours   Studies/Results: Dg Abd 1 View  11/05/2012  *RADIOLOGY REPORT*  Clinical Data: Right upper quadrant pain.  ABDOMEN - 1 VIEW  Comparison: None.  Findings: Supine view of the abdomen was obtained.  There is a nonspecific bowel gas pattern.  Few gas-filled loops of bowel in the abdomen.  No significant small bowel dilatation. Calcifications in the pelvis are suggestive for phleboliths.  High density focus in the right upper quadrant is probably associated with bowel or stool but cannot exclude a calcification in this area.  IMPRESSION: Nonspecific bowel gas pattern.  Difficult to exclude right upper quadrant calcifications, as described.   Original Report Authenticated By: Richarda Overlie, M.D.    Dg Chest Port 1 View  11/04/2012  *RADIOLOGY REPORT*  Clinical Data: Cough, chills, chest tightness, shortness of breath.  PORTABLE CHEST - 1 VIEW  Comparison: 07/03/2011  Findings: Normal heart size and pulmonary vascularity.  Linear atelectasis or infiltration in both lung bases.  This is new since the previous study.  No focal airspace consolidation.  No blunting of costophrenic angles.  No pneumothorax.  Mediastinal contours are intact.  Tortuous aorta.  IMPRESSION: Developing linear infiltration or atelectasis in both lung bases.   Original Report Authenticated By: Burman Nieves, M.D.     Impression/Plan: 67 yo with H1N1 on treatment who had one episode of coffee grounds emesis. Question esophagitis vs. Peptic ulcer disease vs. Oropharyngeal source. EGD planned. Supportive care. Continue PPI.    LOS: 1 day   Riddhi Grether C.  11/05/2012, 4:59 PM

## 2012-11-06 ENCOUNTER — Encounter (HOSPITAL_COMMUNITY): Payer: Self-pay | Admitting: *Deleted

## 2012-11-06 ENCOUNTER — Encounter (HOSPITAL_COMMUNITY)
Admission: EM | Disposition: A | Discharge status: Skilled Nursing Facility | Payer: Self-pay | Source: Home / Self Care | Attending: Internal Medicine

## 2012-11-06 DIAGNOSIS — J101 Influenza due to other identified influenza virus with other respiratory manifestations: Secondary | ICD-10-CM | POA: Diagnosis present

## 2012-11-06 HISTORY — PX: ESOPHAGOGASTRODUODENOSCOPY: SHX5428

## 2012-11-06 LAB — BASIC METABOLIC PANEL
Calcium: 8.7 mg/dL (ref 8.4–10.5)
Creatinine, Ser: 0.56 mg/dL (ref 0.50–1.10)
GFR calc non Af Amer: >90 mL/min (ref >90)
Sodium: 140 mEq/L (ref 135–145)

## 2012-11-06 LAB — CBC
MCH: 28.5 pg (ref 26.0–34.0)
MCHC: 33.6 g/dL (ref 30.0–36.0)
MCV: 84.8 fL (ref 78.0–100.0)
MCV: 84.5 fL (ref 78.0–100.0)
Platelets: 127 10*3/uL — ABNORMAL LOW (ref 150–400)
Platelets: 132 10*3/uL — ABNORMAL LOW (ref 150–400)
RBC: 4.2 MIL/uL (ref 3.87–5.11)
RDW: 14.7 % (ref 11.5–15.5)
RDW: 14.4 % (ref 11.5–15.5)
WBC: 3.4 10*3/uL — ABNORMAL LOW (ref 4.0–10.5)

## 2012-11-06 SURGERY — EGD (ESOPHAGOGASTRODUODENOSCOPY)
Anesthesia: Moderate Sedation

## 2012-11-06 MED ORDER — BUTAMBEN-TETRACAINE-BENZOCAINE 2-2-14 % EX AERO
INHALATION_SPRAY | CUTANEOUS | Status: DC | PRN
  Administered 2012-11-06: 2 via TOPICAL

## 2012-11-06 MED ORDER — FENTANYL CITRATE 0.05 MG/ML IJ SOLN
INTRAMUSCULAR | Status: AC
  Filled 2012-11-06: qty 2

## 2012-11-06 MED ORDER — FENTANYL CITRATE 0.05 MG/ML IJ SOLN
INTRAMUSCULAR | Status: DC | PRN
  Administered 2012-11-06 (×2): 25 ug via INTRAVENOUS

## 2012-11-06 MED ORDER — SODIUM CHLORIDE 0.9 % IV SOLN
INTRAVENOUS | Status: DC
  Administered 2012-11-06: 500 mL via INTRAVENOUS

## 2012-11-06 MED ORDER — DIPHENHYDRAMINE HCL 50 MG/ML IJ SOLN
INTRAMUSCULAR | Status: AC
  Filled 2012-11-06: qty 1

## 2012-11-06 MED ORDER — MIDAZOLAM HCL 10 MG/2ML IJ SOLN
INTRAMUSCULAR | Status: DC | PRN
  Administered 2012-11-06: 1 mg via INTRAVENOUS
  Administered 2012-11-06 (×2): 2 mg via INTRAVENOUS

## 2012-11-06 MED ORDER — MIDAZOLAM HCL 5 MG/ML IJ SOLN
INTRAMUSCULAR | Status: AC
  Filled 2012-11-06: qty 2

## 2012-11-06 MED ORDER — POLYETHYLENE GLYCOL 3350 17 G PO PACK
17.0000 g | PACK | Freq: Every day | ORAL | Status: DC
  Administered 2012-11-06 – 2012-11-09 (×4): 17 g via ORAL
  Filled 2012-11-06 (×4): qty 1

## 2012-11-06 MED ORDER — AZITHROMYCIN 500 MG PO TABS
500.0000 mg | ORAL_TABLET | Freq: Every day | ORAL | Status: DC
  Administered 2012-11-06 – 2012-11-09 (×4): 500 mg via ORAL
  Filled 2012-11-06 (×4): qty 1

## 2012-11-06 MED ORDER — PANTOPRAZOLE SODIUM 40 MG IV SOLR
40.0000 mg | INTRAVENOUS | Status: DC
  Filled 2012-11-06: qty 40

## 2012-11-06 NOTE — Interval H&P Note (Signed)
History and Physical Interval Note:  11/06/2012 12:11 PM  Heather Lutz  has presented today for surgery, with the diagnosis of gi bleed, coffee ground emesis  The various methods of treatment have been discussed with the patient and family. After consideration of risks, benefits and other options for treatment, the patient has consented to  Procedure(s): ESOPHAGOGASTRODUODENOSCOPY (EGD) (N/A) as a surgical intervention .  The patient's history has been reviewed, patient examined, no change in status, stable for surgery.  I have reviewed the patient's chart and labs.  Questions were answered to the patient's satisfaction.     Narada Uzzle C.

## 2012-11-06 NOTE — Progress Notes (Signed)
Patient is incontinent of bowel and bladder.  Will continue to monitor.  Macarthur Critchley, RN

## 2012-11-06 NOTE — Progress Notes (Signed)
[facility] PROGRESS NOTE  Heather Lutz [place] ZOX:[id number] DOB: 24-Jul-1946 DOA: 11/04/2012 PCP: Dennis Bast, MD  HPI/Subjective: Feels better, denies any vomiting since yesterday.    Assessment/Plan:  Influenza H1N1 A respiratory infection -Chest x-ray did not show infiltrates but showed bronchitic changes. -Patient is on Tamiflu, she is afebrile currently. -On IV antibiotics including Rocephin and azithromycin, for secondary infections. -Supportive management with inhaled bronchodilators, mucolytics, expectorants and oxygen as needed.  Nausea and vomiting -Coffee-ground emesis, positive for gastric occult blood. -Appreciate Dr. Marge Duncans help, patient for EGD today. -Currently n.p.o., she is on twice a day PPIs  Constipation -Will be started on MiraLax after her EGD.  Hypoxemia  -Secondary to the influenza infection/ acute bronchitis, when necessary oxygen and continuous pulse ox.  Multiple sclerosis -Stable, no changes, continue patient routine. Patient is to use walker and assist to ambulate.  Code Status: Full code Family Communication: Plan discussed with the patient and her husband at bedside. Disposition Plan: Inpatient   Consultants:  [facility]  Procedures:  None  Antibiotics:  Rocephin and azithromycin.    Objective: Filed Vitals:   11/05/12 2231 11/06/12 0221 11/06/12 0507 11/06/12 0844  BP:  112/71 120/76 132/78  Pulse:  87 87 84  Temp:  98.3 F (36.8 C) 98.2 F (36.8 C) 98.7 F (37.1 C)  TempSrc:  Oral Oral Oral  Resp:  17 17 18   Height:      Weight:      SpO2: 100% 97% 98% 98%   No intake or output data in the 24 hours ending 11/06/12 0921 Filed Weights   11/04/12 1850 11/05/12 0415  Weight: 74.844 kg (165 lb) 75 kg (165 lb 5.5 oz)    Exam:  General: Alert and awake, oriented x3, not in any acute distress. HEENT: anicteric sclera, pupils reactive to light and accommodation, EOMI CVS: S1-S2 clear, no murmur rubs or  gallops Chest: clear to auscultation bilaterally, no wheezing, rales or rhonchi Abdomen: soft nontender, nondistended, normal bowel sounds, no organomegaly Extremities: no cyanosis, clubbing or edema noted bilaterally Neuro: Cranial nerves II-XII intact, no focal neurological deficits  Data Reviewed: Basic Metabolic Panel:  Recent Labs Lab 11/04/12 2115 11/06/12 0740  NA 140 140  K 4.3 3.9  CL 105 107  CO2 23 26  GLUCOSE 118* 88  BUN 14 7  CREATININE 0.70 0.56  CALCIUM 9.8 8.7   Liver Function Tests:  Recent Labs Lab 11/04/12 2115  AST 26  ALT 13  ALKPHOS 112  BILITOT 0.4  PROT 8.2  ALBUMIN 3.8   No results found for this basename: LIPASE, AMYLASE,  in the last 168 hours No results found for this basename: AMMONIA,  in the last 168 hours CBC:  Recent Labs Lab 11/04/12 2115 11/05/12 1818 11/06/12 0740  WBC 7.6 4.2 3.4*  NEUTROABS 6.3  --   --   HGB 14.0 12.0 11.6*  HCT 41.3 35.1* 34.5*  MCV 85.5 84.0 84.8  PLT 151 140* 127*   Cardiac Enzymes: No results found for this basename: CKTOTAL, CKMB, CKMBINDEX, TROPONINI,  in the last 168 hours BNP (last 3 results) No results found for this basename: PROBNP,  in the last 8760 hours CBG: No results found for this basename: GLUCAP,  in the last 168 hours  No results found for this or any previous visit (from the past 240 hour(s)).   Studies: Dg Abd 1 View  11/05/2012  *RADIOLOGY REPORT*  Clinical Data: Right upper quadrant pain.  ABDOMEN - 1 VIEW  Comparison: None.  Findings: Supine view of the abdomen was obtained.  There is a nonspecific bowel gas pattern.  Few gas-filled loops of bowel in the abdomen.  No significant small bowel dilatation. Calcifications in the pelvis are suggestive for phleboliths.  High density focus in the right upper quadrant is probably associated with bowel or stool but cannot exclude a calcification in this area.  IMPRESSION: Nonspecific bowel gas pattern.  Difficult to exclude right upper  quadrant calcifications, as described.   Original Report Authenticated By: Richarda Overlie, M.D.    Dg Chest Port 1 View  11/04/2012  *RADIOLOGY REPORT*  Clinical Data: Cough, chills, chest tightness, shortness of breath.  PORTABLE CHEST - 1 VIEW  Comparison: 07/03/2011  Findings: Normal heart size and pulmonary vascularity.  Linear atelectasis or infiltration in both lung bases.  This is new since the previous study.  No focal airspace consolidation.  No blunting of costophrenic angles.  No pneumothorax.  Mediastinal contours are intact.  Tortuous aorta.  IMPRESSION: Developing linear infiltration or atelectasis in both lung bases.   Original Report Authenticated By: Burman Nieves, M.D.     Scheduled Meds: . azithromycin  500 mg Intravenous Q24H  . cefTRIAXone (ROCEPHIN)  IV  1 g Intravenous Q24H  . enoxaparin (LOVENOX) injection  40 mg Subcutaneous Q24H  . guaiFENesin  1,200 mg Oral BID  . oseltamivir  75 mg Oral BID  . pantoprazole (PROTONIX) IV  40 mg Intravenous Q12H   Continuous Infusions: . sodium chloride 75 mL/hr at 11/06/12 0626  . sodium chloride      Principal Problem:   CAP (community acquired pneumonia) Active Problems:   Hypoxemia   SOB (shortness of breath)   Multiple sclerosis    Time spent: 35 minutes    [facility]  [facility] Pager [phone]. If 8PM-8AM, please contact night-coverage at [email or web address], password [facility] 11/06/2012, 9:21 AM  LOS: 2 days

## 2012-11-06 NOTE — H&P (View-Only) (Signed)
[facility] PROGRESS NOTE  Heather Lutz MRN:[id number] DOB: 06/20/1946 DOA: 11/04/2012 PCP: CABEZA,YURI, MD  HPI/Subjective: Feels better, denies any vomiting since yesterday.    Assessment/Plan:  Influenza H1N1 A respiratory infection -Chest x-ray did not show infiltrates but showed bronchitic changes. -Patient is on Tamiflu, she is afebrile currently. -On IV antibiotics including Rocephin and azithromycin, for secondary infections. -Supportive management with inhaled bronchodilators, mucolytics, expectorants and oxygen as needed.  Nausea and vomiting -Coffee-ground emesis, positive for gastric occult blood. -Appreciate Heather Lutz's help, patient for EGD today. -Currently n.p.o., she is on twice a day PPIs  Constipation -Will be started on MiraLax after her EGD.  Hypoxemia  -Secondary to the influenza infection/ acute bronchitis, when necessary oxygen and continuous pulse ox.  Multiple sclerosis -Stable, no changes, continue patient routine. Patient is to use walker and assist to ambulate.  Code Status: Full code Family Communication: Plan discussed with the patient and her husband at bedside. Disposition Plan: Inpatient   Consultants:  Heather Lutz  Procedures:  None  Antibiotics:  Rocephin and azithromycin.    Objective: Filed Vitals:   11/05/12 2231 11/06/12 0221 11/06/12 0507 11/06/12 0844  BP:  112/71 120/76 132/78  Pulse:  87 87 84  Temp:  98.3 F (36.8 C) 98.2 F (36.8 C) 98.7 F (37.1 C)  TempSrc:  Oral Oral Oral  Resp:  17 17 18  Height:      Weight:      SpO2: 100% 97% 98% 98%   No intake or output data in the 24 hours ending 11/06/12 0921 Filed Weights   11/04/12 1850 11/05/12 0415  Weight: 74.844 kg (165 lb) 75 kg (165 lb 5.5 oz)    Exam:  General: Alert and awake, oriented x3, not in any acute distress. HEENT: anicteric sclera, pupils reactive to light and accommodation, EOMI CVS: S1-S2 clear, no murmur rubs or  gallops Chest: clear to auscultation bilaterally, no wheezing, rales or rhonchi Abdomen: soft nontender, nondistended, normal bowel sounds, no organomegaly Extremities: no cyanosis, clubbing or edema noted bilaterally Neuro: Cranial nerves II-XII intact, no focal neurological deficits  Data Reviewed: Basic Metabolic Panel:  Recent Labs Lab 11/04/12 2115 11/06/12 0740  NA 140 140  K 4.3 3.9  CL 105 107  CO2 23 26  GLUCOSE 118* 88  BUN 14 7  CREATININE 0.70 0.56  CALCIUM 9.8 8.7   Liver Function Tests:  Recent Labs Lab 11/04/12 2115  AST 26  ALT 13  ALKPHOS 112  BILITOT 0.4  PROT 8.2  ALBUMIN 3.8   No results found for this basename: LIPASE, AMYLASE,  in the last 168 hours No results found for this basename: AMMONIA,  in the last 168 hours CBC:  Recent Labs Lab 11/04/12 2115 11/05/12 1818 11/06/12 0740  WBC 7.6 4.2 3.4*  NEUTROABS 6.3  --   --   HGB 14.0 12.0 11.6*  HCT 41.3 35.1* 34.5*  MCV 85.5 84.0 84.8  PLT 151 140* 127*   Cardiac Enzymes: No results found for this basename: CKTOTAL, CKMB, CKMBINDEX, TROPONINI,  in the last 168 hours BNP (last 3 results) No results found for this basename: PROBNP,  in the last 8760 hours CBG: No results found for this basename: GLUCAP,  in the last 168 hours  No results found for this or any previous visit (from the past 240 hour(s)).   Studies: Dg Abd 1 View  11/05/2012  *RADIOLOGY REPORT*  Clinical Data: Right upper quadrant pain.  ABDOMEN - 1 VIEW    Comparison: None.  Findings: Supine view of the abdomen was obtained.  There is a nonspecific bowel gas pattern.  Few gas-filled loops of bowel in the abdomen.  No significant small bowel dilatation. Calcifications in the pelvis are suggestive for phleboliths.  High density focus in the right upper quadrant is probably associated with bowel or stool but cannot exclude a calcification in this area.  IMPRESSION: Nonspecific bowel gas pattern.  Difficult to exclude right upper  quadrant calcifications, as described.   Original Report Authenticated By: Adam Henn, M.D.    Dg Chest Port 1 View  11/04/2012  *RADIOLOGY REPORT*  Clinical Data: Cough, chills, chest tightness, shortness of breath.  PORTABLE CHEST - 1 VIEW  Comparison: 07/03/2011  Findings: Normal heart size and pulmonary vascularity.  Linear atelectasis or infiltration in both lung bases.  This is new since the previous study.  No focal airspace consolidation.  No blunting of costophrenic angles.  No pneumothorax.  Mediastinal contours are intact.  Tortuous aorta.  IMPRESSION: Developing linear infiltration or atelectasis in both lung bases.   Original Report Authenticated By: William Stevens, M.D.     Scheduled Meds: . azithromycin  500 mg Intravenous Q24H  . cefTRIAXone (ROCEPHIN)  IV  1 g Intravenous Q24H  . enoxaparin (LOVENOX) injection  40 mg Subcutaneous Q24H  . guaiFENesin  1,200 mg Oral BID  . oseltamivir  75 mg Oral BID  . pantoprazole (PROTONIX) IV  40 mg Intravenous Q12H   Continuous Infusions: . sodium chloride 75 mL/hr at 11/06/12 0626  . sodium chloride      Principal Problem:   CAP (community acquired pneumonia) Active Problems:   Hypoxemia   SOB (shortness of breath)   Multiple sclerosis    Time spent: 35 minutes    Heather Lutz A  [facility] Pager [phone]. If 8PM-8AM, please contact night-coverage at [email or web address], password [email or web address] 11/06/2012, 9:21 AM  LOS: 2 days              

## 2012-11-06 NOTE — Op Note (Signed)
[facility] [address] [place], [address]   ENDOSCOPY PROCEDURE REPORT  PATIENT: Heather Lutz, Heather Lutz  MR#: [id number] BIRTHDATE: 03/02/1946 , 66  yrs. old GENDER: Female  ENDOSCOPIST: Charlott Rakes, MD REFERRED BY:  PROCEDURE DATE:  11/06/2012 PROCEDURE:   EGD, diagnostic ASA CLASS:   Class III INDICATIONS:Hematemesis. MEDICATIONS: Fentanyl 50 mcg IV, Versed 5 mg IV, and Cetacaine spray x 2  TOPICAL ANESTHETIC:  DESCRIPTION OF PROCEDURE:   After the risks benefits and alternatives of the procedure were thoroughly explained, informed consent was obtained.  The Pentax Gastroscope [id number]  endoscope was introduced through the mouth and advanced to the second portion of the duodenum , limited by Without limitations.   The instrument was slowly withdrawn as the mucosa was fully examined.     FINDINGS: The endoscope was inserted into the oropharynx and esophagus was intubated. The esophagus was normal in appearance. The gastroesophageal junction was noted to be 30 cm from the incisors. Endoscope was advanced into the stomach, which revealed a medium-sized hiatal hernia without any visible bleeding. Scattered erythema seen in antrum and area of edema and erythema seen in pyloric channel but no ulcer noted. Remaining part of the stomach was normal in appearance. The endoscope was advanced to the duodenal bulb and second portion of duodenum which were unremarkable.  The endoscope was withdrawn back into the stomach and retroflexion showed the hiatal hernia and was otherwise normal. Small superficial short linear red spot seen on withdrawal in hiatal hernia that was not seen on insertion and likely is due to scope trauma. Clear fluid seen in the stomach and examined duodenum.  COMPLICATIONS: None  ENDOSCOPIC IMPRESSION:     Medium-sized hiatal hernia Antral and pyloric gastritis No active bleeding Suspect episode was due to hiatal  hernia or esophagus from vomiting but has since resolved  RECOMMENDATIONS: Advance diet as tolerated; Supportive care   REPEAT EXAM: N/A  _______________________________ Charlott Rakes, MD eSigned:  Charlott Rakes, MD 11/06/2012 12:39 PM    CC:  PATIENT NAME:  Heather Lutz, Heather Lutz MR#: [id number]

## 2012-11-06 NOTE — Brief Op Note (Signed)
No active bleeding; No ulcer; See note for details/recs. Advance diet. No further GI workup. Will sign off. Call if questions.

## 2012-11-07 ENCOUNTER — Encounter (HOSPITAL_COMMUNITY): Payer: Self-pay | Admitting: Gastroenterology

## 2012-11-07 LAB — CBC
MCH: 28.3 pg (ref 26.0–34.0)
MCHC: 33.2 g/dL (ref 30.0–36.0)
Platelets: 194 10*3/uL (ref 150–400)
Platelets: 150 10*3/uL (ref 150–400)
RBC: 4.52 MIL/uL (ref 3.87–5.11)
RDW: 13.9 % (ref 11.5–15.5)
WBC: 3.2 10*3/uL — ABNORMAL LOW (ref 4.0–10.5)

## 2012-11-07 LAB — BASIC METABOLIC PANEL
BUN: 7 mg/dL (ref 6–23)
Calcium: 9 mg/dL (ref 8.4–10.5)
GFR calc Af Amer: >90 mL/min (ref >90)
GFR calc non Af Amer: >90 mL/min (ref >90)
Glucose, Bld: 80 mg/dL (ref 70–99)
Potassium: 3.8 mEq/L (ref 3.5–5.1)
Sodium: 138 mEq/L (ref 135–145)

## 2012-11-07 MED ORDER — PANTOPRAZOLE SODIUM 40 MG PO TBEC
40.0000 mg | DELAYED_RELEASE_TABLET | Freq: Every day | ORAL | Status: DC
  Administered 2012-11-07 – 2012-11-09 (×3): 40 mg via ORAL
  Filled 2012-11-07 (×3): qty 1

## 2012-11-07 MED ORDER — BISACODYL 10 MG RE SUPP
10.0000 mg | Freq: Once | RECTAL | Status: AC
  Administered 2012-11-07: 10 mg via RECTAL
  Filled 2012-11-07: qty 1

## 2012-11-07 NOTE — Progress Notes (Signed)
Pt 02 SAT 95% on RA, no c/o sob.

## 2012-11-07 NOTE — Care Management Note (Addendum)
    Page 1 of 1   11/09/2012     10:38:58 AM   CARE MANAGEMENT NOTE 11/09/2012  Patient:  Heather Lutz, Heather Lutz   Account Number:  [id number]  Date Initiated:  11/07/2012  Documentation initiated by:  Letha Cape  Subjective/Objective Assessment:   dx influenza , pna  admit- lives with spouse. Pt uses w/chair and rolling walker.     Action/Plan:   pt/ot eval- cir vs snf vs hh 24 hr  per CIR screen pt is not CIR candidate.   Anticipated DC Date:  11/09/2012   Anticipated DC Plan:  SKILLED NURSING FACILITY  In-house referral  Clinical Social Worker      DC Planning Services  CM consult      Choice offered to / List presented to:             Status of service:  Completed, signed off Medicare Important Message given?   (If response is "NO", the following Medicare IM given date fields will be blank) Date Medicare IM given:   Date Additional Medicare IM given:    Discharge Disposition:  SKILLED NURSING FACILITY  Per UR Regulation:  Reviewed for med. necessity/level of care/duration of stay  If discussed at Long Length of Stay Meetings, dates discussed:    Comments:  11/09/12 10:38 Letha Cape RN, BSN [phone] patient for dc to [facility] today.  CSW following.  11/08/12 11:10 Letha Cape RN, BSN [phone] per physical therapy recs snf vs cir,  per cir patient is not a candidate for cir, recs snf vs hh with 24 hr care, patient and spouse interested in snf.  CSW  referral made, CSW following.  11/07/12 15:14 Letha Cape RN, BSN [phone] patient lives with spouse, patient uses rolling walker and w/chair at home.  Await pt/ot eval.  NCM will continue to follow for dc needs.

## 2012-11-07 NOTE — Evaluation (Signed)
Physical Therapy Evaluation Patient Details Name: Heather Lutz MRN: [id number] DOB: 1946-07-23 Today's Date: 11/07/2012 Time: 9381-0175 PT Time Calculation (min): 57 min  PT Assessment / Plan / Recommendation Clinical Impression  Patient is a 67 yo female admitted with influenza, with history of MS.  Patient requiring +2 assist for mobility/standing, and unable to take steps to ambulate today.  Patient is also incontinent, requiring total assist to clean/change.  At this point, patient will need 24 hour assist for safety, and recommend use of scooter initially until mobility improves.  Will benefit from acute PT to maximize independence and decrease burden of care for caregiver.  If patient has 24 hour assist, recommend HHPT to continue therapy.  If not, may need to consider post-acute inpatient rehab (CIR vs. SNF).    PT Assessment  Patient needs continued PT services    Follow Up Recommendations  Home health PT; Supervision/Assistance - 24 hour; (CIR vs SNF if no 24 hour assist)    Does the patient have the potential to tolerate intense rehabilitation      Barriers to Discharge Decreased caregiver support      Equipment Recommendations  None recommended by PT    Recommendations for Other Services Rehab consult   Frequency Min 4X/week    Precautions / Restrictions Precautions Precautions: Fall Restrictions Weight Bearing Restrictions: No   Pertinent Vitals/Pain       Mobility  Bed Mobility Bed Mobility: Rolling Right;Rolling Left;Right Sidelying to Sit;Sitting - Scoot to [place] of Bed;Sit to Supine;Scooting to [facility] Rolling Right: 1: +1 Total assist;With rail Rolling Left: 1: +1 Total assist;With rail Right Sidelying to Sit: 1: +1 Total assist;HOB elevated Sitting - Scoot to Edge of Bed: 2: Max assist Sit to Supine: 1: +2 Total assist;HOB flat Sit to Supine: Patient Percentage: 10% Scooting to HOB: 1: +2 Total assist Scooting to [facility]: Patient Percentage:  0% Details for Bed Mobility Assistance: Patient found in wet bedding - is incontinent (different from past).  Rolled to left and right to clean, and apply undergarments/pad.  Patient with increased extensor tone in bil. LE's.  Attempted to break tone with external rotation at hips, and flexion at hips/knees. Unable to flex more than 30 degrees in supine.  Verbal cues to move to sitting - required total assist.  Once at edge of bed, LE tone decreased, with hip and knee flexion to allow patient to sit EOB.  Patient required min assist to maintain balance.   Transfers Transfers: Sit to Stand;Stand to Sit Sit to Stand: 1: +2 Total assist;From elevated surface;With upper extremity assist;From bed Sit to Stand: Patient Percentage: 30% Stand to Sit: 1: +2 Total assist;With upper extremity assist;To bed Stand to Sit: Patient Percentage: 30% Details for Transfer Assistance: Verbal cues for hand placement and technique.  Patient unable to push up from bed.  Allowed her to place UE's on RW and elevated bed.  Required +2 to initiate rise to standing.  Once upright, extensor tone in bil. LE's returned.  Patient attempted to move feet to take a step - unable.  Patient stood x2 for 20-30 seconds each time.  Assist to control descent to bed. Ambulation/Gait Ambulation/Gait Assistance: Not tested (comment)    Exercises     PT Diagnosis: Difficulty walking;Generalized weakness  PT Problem List: Decreased strength;Decreased range of motion;Decreased activity tolerance;Decreased balance;Decreased mobility;Decreased coordination;Cardiopulmonary status limiting activity;Impaired tone PT Treatment Interventions: DME instruction;Gait training;Functional mobility training;Balance training;Patient/family education   PT Goals Acute Rehab PT Goals PT Goal Formulation:  With patient Time For Goal Achievement: 11/14/12 Potential to Achieve Goals: Good Pt will Roll Supine to Right Side: with min assist;with rail PT Goal:  Rolling Supine to Right Side - Progress: Goal set today Pt will Roll Supine to Left Side: with min assist;with rail PT Goal: Rolling Supine to Left Side - Progress: Goal set today Pt will go Sit to Stand: with min assist;with upper extremity assist PT Goal: Sit to Stand - Progress: Goal set today Pt will go Stand to Sit: with min assist;with upper extremity assist PT Goal: Stand to Sit - Progress: Goal set today Pt will Transfer Bed to Chair/Chair to Bed: with min assist PT Transfer Goal: Bed to Chair/Chair to Bed - Progress: Goal set today Pt will Ambulate: 16 - 50 feet;with min assist;with rolling walker PT Goal: Ambulate - Progress: Goal set today  Visit Information  Last PT Received On: 11/07/12 Assistance Needed: +2    Subjective Data  Subjective: "When I'm sick, I get stiff.  I have spasms."  Patient reports she is alone during day when husband is at work. Patient Stated Goal: To be able to walk.   Prior Functioning  Home Living Lives With: Spouse Available Help at Discharge: Family;Available PRN/intermittently (Husband works - patient home alone)) Type of Home: House Home Access: Ramped entrance Home Layout: One level Bathroom Shower/Tub: Walk-in shower (Patient takes sponge baths) Bathroom Toilet: Standard Bathroom Accessibility: Yes How Accessible: Accessible via walker Home Adaptive Equipment: Walker - four wheeled;Data processing manager) Prior Function Level of Independence: Independent with assistive device(s);Needs assistance Needs Assistance: Meal Prep;Light Housekeeping Meal Prep: Moderate Light Housekeeping: Maximal Able to Take Stairs?: No Driving: No Comments: Patient sleeps in lift chair. Communication Communication: No difficulties    Cognition  Cognition Overall Cognitive Status: Appears within functional limits for tasks assessed/performed Arousal/Alertness: Awake/alert Orientation Level: Oriented X4 / Intact Behavior During Session: [facility] for  tasks performed    Extremity/Trunk Assessment Right Upper Extremity Assessment RUE ROM/Strength/Tone: Deficits RUE ROM/Strength/Tone Deficits: Strength 3+/5 with poor grip strength RUE Sensation: WFL - Light Touch Left Upper Extremity Assessment LUE ROM/Strength/Tone: Deficits LUE ROM/Strength/Tone Deficits: Strength 3+/5 with poor grip strength LUE Sensation: WFL - Light Touch Right Lower Extremity Assessment RLE ROM/Strength/Tone: Deficits RLE ROM/Strength/Tone Deficits: Increased extensor tone; Strength 2/5 RLE Sensation: WFL - Light Touch (Noted edema - tender to touch) RLE Coordination: Deficits RLE Coordination Deficits: Patient unable to test heel-to-shin in sitting Left Lower Extremity Assessment LLE ROM/Strength/Tone: Deficits LLE ROM/Strength/Tone Deficits: Increased extensor tone; Strength 2/5 LLE Sensation: WFL - Light Touch (Noted edema - tender to touch) LLE Coordination: Deficits LLE Coordination Deficits: Patient unable to test heel-to-shin in sitting   Balance Balance Balance Assessed: Yes Static Sitting Balance Static Sitting - Balance Support: Bilateral upper extremity supported;Feet supported Static Sitting - Level of Assistance: 4: Min assist Static Sitting - Comment/# of Minutes: Patient sat at EOB x 8 minutes working on midline posture and balance.  Patient with posterior lean, requiring occasional min assist to regain balance.  End of Session PT - End of Session Equipment Utilized During Treatment: Gait belt Activity Tolerance: Patient limited by fatigue Patient left: in bed;with call bell/phone within reach Nurse Communication: Mobility status;Need for lift equipment  GP     Vena Austria 11/07/2012, 6:39 PM Durenda Hurt. Renaldo Fiddler, [facility] Acute Rehab Services Pager [phone]

## 2012-11-07 NOTE — Progress Notes (Signed)
[facility] PROGRESS NOTE  Heather Lutz [place] JYN:[id number] DOB: 1946-03-16 DOA: 11/04/2012 PCP: Dennis Bast, MD  HPI/Subjective: Feels better, denies any shortness of breath, no fever chills. Less cough and sputum production    Assessment/Plan:  Influenza H1N1 A respiratory infection -Chest x-ray did not show infiltrates but showed bronchitic changes. -Patient is on Tamiflu, she is afebrile currently. -On IV antibiotics including Rocephin and azithromycin, for secondary infections. -Supportive management with inhaled bronchodilators, mucolytics, expectorants and oxygen as needed.  Nausea and vomiting -Coffee-ground emesis, positive for gastric occult blood. -Appreciate Dr. Marge Duncans help, EGD done on 2/23 showed hiatal hernia and no source of bleeding. -Advance diet, change to oral Protonix.  Constipation -Will be started on MiraLax after her EGD.  Hypoxemia  -Secondary to the influenza infection/ acute bronchitis, when necessary oxygen and continuous pulse ox.  Multiple sclerosis -Stable, no changes, continue patient routine. Patient is to use walker and assist to ambulate.  Code Status: Full code Family Communication: Plan discussed with the patient and her husband at bedside. Disposition Plan: Inpatient   Consultants:  [facility]  Procedures:  None  Antibiotics:  Rocephin and azithromycin.    Objective: Filed Vitals:   11/06/12 2145 11/07/12 0226 11/07/12 0614 11/07/12 1215  BP: 138/81 145/83 133/79 129/58  Pulse: 84 94 89 88  Temp: 98.8 F (37.1 C) 99 F (37.2 C) 98.6 F (37 C) 98.6 F (37 C)  TempSrc: Oral Oral Oral Oral  Resp: 18 18 18 18   Height:      Weight:      SpO2: 100% 98% 96% 93%    Intake/Output Summary (Last 24 hours) at 11/07/12 1248 Last data filed at 11/07/12 0900  Gross per 24 hour  Intake    180 ml  Output      0 ml  Net    180 ml   Filed Weights   11/04/12 1850 11/05/12 0415  Weight: 74.844 kg (165 lb) 75 kg  (165 lb 5.5 oz)    Exam:  General: Alert and awake, oriented x3, not in any acute distress. HEENT: anicteric sclera, pupils reactive to light and accommodation, EOMI CVS: S1-S2 clear, no murmur rubs or gallops Chest: clear to auscultation bilaterally, no wheezing, rales or rhonchi Abdomen: soft nontender, nondistended, normal bowel sounds, no organomegaly Extremities: no cyanosis, clubbing or edema noted bilaterally Neuro: Cranial nerves II-XII intact, no focal neurological deficits  Data Reviewed: Basic Metabolic Panel:  Recent Labs Lab 11/04/12 2115 11/06/12 0740 11/07/12 0700  NA 140 140 138  K 4.3 3.9 3.8  CL 105 107 103  CO2 23 26 27   GLUCOSE 118* 88 80  BUN 14 7 7   CREATININE 0.70 0.56 0.54  CALCIUM 9.8 8.7 9.0   Liver Function Tests:  Recent Labs Lab 11/04/12 2115  AST 26  ALT 13  ALKPHOS 112  BILITOT 0.4  PROT 8.2  ALBUMIN 3.8   No results found for this basename: LIPASE, AMYLASE,  in the last 168 hours No results found for this basename: AMMONIA,  in the last 168 hours CBC:  Recent Labs Lab 11/04/12 2115 11/05/12 1818 11/06/12 0740 11/06/12 1844 11/07/12 0700  WBC 7.6 4.2 3.4* 3.4* 3.8*  NEUTROABS 6.3  --   --   --   --   HGB 14.0 12.0 11.6* 12.0 12.2  HCT 41.3 35.1* 34.5* 35.5* 36.8  MCV 85.5 84.0 84.8 84.5 85.4  PLT 151 140* 127* 132* 194   Cardiac Enzymes: No results found for this basename: CKTOTAL,  CKMB, CKMBINDEX, TROPONINI,  in the last 168 hours BNP (last 3 results) No results found for this basename: PROBNP,  in the last 8760 hours CBG: No results found for this basename: GLUCAP,  in the last 168 hours  No results found for this or any previous visit (from the past 240 hour(s)).   Studies: No results found.  Scheduled Meds: . azithromycin  500 mg Oral Daily  . cefTRIAXone (ROCEPHIN)  IV  1 g Intravenous Q24H  . enoxaparin (LOVENOX) injection  40 mg Subcutaneous Q24H  . guaiFENesin  1,200 mg Oral BID  . oseltamivir  75 mg  Oral BID  . pantoprazole  40 mg Oral Daily  . polyethylene glycol  17 g Oral Daily   Continuous Infusions: . sodium chloride 75 mL/hr at 11/07/12 1015    Principal Problem:   Influenza A (H1N1) Active Problems:   CAP (community acquired pneumonia)   Hypoxemia   SOB (shortness of breath)   Multiple sclerosis   Hematemesis    Time spent: 35 minutes    [facility] A  [facility] Pager [phone]. If 8PM-8AM, please contact night-coverage at [email or web address], password [email or web address] 11/07/2012, 12:48 PM  LOS: 3 days

## 2012-11-08 LAB — CBC
HCT: 37 % (ref 36.0–46.0)
HCT: 38.8 % (ref 36.0–46.0)
Hemoglobin: 12.8 g/dL (ref 12.0–15.0)
Hemoglobin: 13.3 g/dL (ref 12.0–15.0)
MCH: 28.6 pg (ref 26.0–34.0)
MCV: 83.4 fL (ref 78.0–100.0)
Platelets: 171 10*3/uL (ref 150–400)
RBC: 4.45 MIL/uL (ref 3.87–5.11)
RBC: 4.65 MIL/uL (ref 3.87–5.11)
WBC: 4.2 10*3/uL (ref 4.0–10.5)
WBC: 3.7 10*3/uL — ABNORMAL LOW (ref 4.0–10.5)

## 2012-11-08 MED ORDER — OSELTAMIVIR PHOSPHATE 75 MG PO CAPS: 75.0000 mg | ORAL_CAPSULE | Freq: Two times a day (BID) | ORAL | Status: DC

## 2012-11-08 MED ORDER — LEVOFLOXACIN 500 MG PO TABS: 500.0000 mg | ORAL_TABLET | Freq: Every day | ORAL | Status: DC

## 2012-11-08 MED ORDER — POLYETHYLENE GLYCOL 3350 17 G PO PACK: 17.0000 g | PACK | Freq: Every day | ORAL | Status: DC

## 2012-11-08 MED ORDER — OSELTAMIVIR PHOSPHATE 75 MG PO CAPS
75.0000 mg | ORAL_CAPSULE | Freq: Two times a day (BID) | ORAL | Status: AC
  Administered 2012-11-08: 75 mg via ORAL
  Filled 2012-11-08: qty 1

## 2012-11-08 MED ORDER — RANITIDINE HCL 150 MG PO TABS: 150.0000 mg | ORAL_TABLET | Freq: Two times a day (BID) | ORAL | Status: DC

## 2012-11-08 NOTE — Clinical Social Work Psychosocial (Signed)
Clinical Social Work Department BRIEF PSYCHOSOCIAL ASSESSMENT 11/08/2012  Patient:  Heather Lutz, Heather Lutz     Account Number:  [id number]     Admit date:  11/04/2012  Clinical Social Worker:  Johnsie Cancel  Date/Time:  11/08/2012 11:20 AM  Referred by:  Physician  Date Referred:  11/08/2012 Referred for  SNF Placement   Other Referral:   Interview type:  Patient Other interview type:   and patient's husband at bedside.    PSYCHOSOCIAL DATA Living Status:  HUSBAND Admitted from facility:   Level of care:   Primary support name:  Heather Lutz Primary support relationship to patient:  SPOUSE Degree of support available:   Adequate, participated in assessment with CSW.    CURRENT CONCERNS Current Concerns  Post-Acute Placement   Other Concerns:    SOCIAL WORK ASSESSMENT / PLAN CSW consulted re: SNF placement. CSW met patient and spouse at bedside. CSW provided education on the SNF process. CSW provided support surrounding SNF, and current hospitalization. Patient and spouse are agreeable to SNF. CSW will start the [facility] search.   Assessment/plan status:  Information/Referral to [facility] Other assessment/ plan:   Information/referral to community resources:   SNF List    PATIENT'S/FAMILY'S RESPONSE TO PLAN OF CARE: Patient and spouse thanked CSW for assisting in SNF process and providing support.   Lia Foyer, LCSWA [facility] Clinical Social Worker Contact #: [phone]

## 2012-11-08 NOTE — Progress Notes (Signed)
Occupational Therapy Evaluation Patient Details Name: Heather Lutz MRN: [id number] DOB: [birth year] Today's Date: 11/08/2012 Time: 4098-1191 OT Time Calculation (min): 53 min  OT Assessment / Plan / Recommendation Clinical Impression  67 yo female admitted with influzena with baseline MS now with bil LE weakness affecting all adls and basic transfers. Recommend CIR however note in chart for insurance denial. Recommend SNF for d/c planning now due to pt's need for continued services. Ot to follow acutely    OT Assessment  Patient needs continued OT Services    Follow Up Recommendations  CIR;SNF (recommend CIR but selecting SNF due to denial)    Barriers to Discharge      Equipment Recommendations  3 in 1 bedside comode    Recommendations for Other Services Rehab consult  Frequency  Min 3X/week    Precautions / Restrictions Precautions Precautions: Fall Precaution Comments: droplet isolation Restrictions Weight Bearing Restrictions: No   Pertinent Vitals/Pain     ADL  Eating/Feeding: Set up Where Assessed - Eating/Feeding: Chair Grooming: Wash/dry hands;Wash/dry face;Set up Where Assessed - Grooming: Supported sitting Upper Body Bathing: Chest;Right arm;Left arm;Abdomen;Min guard Where Assessed - Upper Body Bathing: Supported sitting Lower Body Bathing: Moderate assistance Where Assessed - Lower Body Bathing: Supported standing Upper Body Dressing: Set up Where Assessed - Upper Body Dressing: Supported sitting Lower Body Dressing: Maximal assistance Where Assessed - Lower Body Dressing: Supported sit to Pharmacist, hospital: Moderate assistance Toilet Transfer Method: Sit to stand Toilet Transfer Equipment: Raised toilet seat with arms (or 3-in-1 over toilet) Toileting - Clothing Manipulation and Hygiene: Maximal assistance Where Assessed - Toileting Clothing Manipulation and Hygiene: Sit to stand from 3-in-1 or toilet Equipment Used: Gait belt;Rolling  walker Transfers/Ambulation Related to ADLs: Pt completed 4 steps foward toward sink with max (A) to weight shift and total (A) to advance Rt LE. Pt with weight shift is able to advance Lt LE ADL Comments: Pt requesting to take bath and agreeable to sink level. pt with void of bowel and unaware. pt with positive large stool this session. pt completed sit<>stand x 4 times. Each time static standing progressing and pt using mirror to help visualize upright posture. Pt required (A) to abduct bil LE for peri hygiene in front sitting. Pt static standing with total (A) for peri care buttock. Pt applied two pads and adult diaper to prevent incontinence. Pt static standing for 1 minute    OT Diagnosis: Generalized weakness  OT Problem List: Decreased strength;Decreased activity tolerance;Impaired balance (sitting and/or standing);Decreased coordination;Decreased safety awareness;Decreased knowledge of use of DME or AE;Decreased knowledge of precautions OT Treatment Interventions: Self-care/ADL training;Therapeutic exercise;Neuromuscular education;DME and/or AE instruction;Therapeutic activities;Patient/family education;Balance training   OT Goals Acute Rehab OT Goals OT Goal Formulation: With patient Time For Goal Achievement: 11/22/12 Potential to Achieve Goals: Good ADL Goals Pt Will Perform Grooming: with supervision;Standing at sink;Unsupported ADL Goal: Grooming - Progress: Goal set today Pt Will Transfer to Toilet: with min assist;Ambulation;3-in-1 ADL Goal: Toilet Transfer - Progress: Goal set today Miscellaneous OT Goals Miscellaneous OT Goal #1: Pt will complete basic transfer to left side Supervision level as precursor to 3n1 transfer OT Goal: Miscellaneous Goal #1 - Progress: Goal set today  Visit Information  Last OT Received On: 11/08/12 Assistance Needed: +2 (for ambulation and safety)    Subjective Data  Subjective: "my husband is a chaplin here Patient Stated Goal: to get back to  walking   Prior Functioning     Home Living Lives With: Spouse (husband  is a chaplin with [facility]) Available Help at Discharge: Family;Available PRN/intermittently Type of Home: House Home Access: Ramped entrance Home Layout: One level Bathroom Shower/Tub: Health visitor: Standard Bathroom Accessibility: Yes How Accessible: Accessible via walker Home Adaptive Equipment: Dan Humphreys - four wheeled;Art gallery manager Additional Comments: Pt with previous flu that caused BIL LE weakness and Rt LE >LT LE in past  Prior Function Level of Independence: Independent with assistive device(s);Needs assistance Needs Assistance: Meal Prep;Light Housekeeping Meal Prep: Moderate Light Housekeeping: Maximal Able to Take Stairs?: No Driving: No Vocation: Retired Comments: sleeps in Transport planner Communication: No difficulties Dominant Hand: Right         Vision/Perception Vision - History Baseline Vision: No visual deficits Patient Visual Report: No change from baseline   Cognition  Cognition Overall Cognitive Status: Appears within functional limits for tasks assessed/performed Arousal/Alertness: Awake/alert Orientation Level: Appears intact for tasks assessed Behavior During Session: [facility] for tasks performed    Extremity/Trunk Assessment Right Upper Extremity Assessment RUE ROM/Strength/Tone: Deficits RUE ROM/Strength/Tone Deficits: 4 out 5 RUE Sensation: Deficits (baseling tingling) Left Upper Extremity Assessment LUE ROM/Strength/Tone: Deficits LUE ROM/Strength/Tone Deficits: 4 out 5 LUE Sensation: Deficits (baseline tingling) Trunk Assessment Trunk Assessment: Normal     Mobility Bed Mobility Bed Mobility: Not assessed Supine to Sit: 3: Mod assist;With rails Details for Bed Mobility Assistance: assisted feet/legs over to edge of bed and patient pulled up on rail (min A given at trunk to sit for safety) Transfers Sit to Stand: 3: Mod assist;With  upper extremity assist;From chair/3-in-1 (rocking motion for momentum ) Sit to Stand: Patient Percentage: 40% Stand to Sit: 3: Mod assist;With upper extremity assist;To chair/3-in-1 ((A) to control descend) Stand to Sit: Patient Percentage: 30% Details for Transfer Assistance: mod v/c for hand placement. pt required momentum and v/c for bil LE placement (widen base of support) Pt needed v/c to reach for RW and for chair with descend and ascend      Exercise Other Exercises Other Exercises: PROM to bilateral LE's for breaking up tone; hip and knee flexion with hip external/internal rotation in hooklying position (one leg at a time.) Other Exercises: seated Long arc quads x 4 each leg; hip flexion with assist for right x 4-5 reps each leg; trunk flexion with forward lean off back of chair arms crossed over chest initially with assist x 5 reps   Balance Static Sitting Balance Static Sitting - Balance Support: No upper extremity supported;Feet supported Static Sitting - Level of Assistance: 5: Stand by assistance Static Sitting - Comment/# of Minutes: sat edge of bed with right hand on bedrail and supervision without loss of balance Static Standing Balance Static Standing - Balance Support: Bilateral upper extremity supported;During functional activity Static Standing - Level of Assistance: 4: Min assist Static Standing - Comment/# of Minutes: Pt at times needed MAx (A) due to LOB. pt is unable to self correct LOB   End of Session OT - End of Session Equipment Utilized During Treatment: Gait belt Activity Tolerance: Patient tolerated treatment well Patient left: in chair;with call bell/phone within reach Nurse Communication: Mobility status;Precautions  GO     Lucile Shutters 11/08/2012, 5:22 PM Pager: [phone]

## 2012-11-08 NOTE — Clinical Social Work Placement (Addendum)
Clinical Social Work Department CLINICAL SOCIAL WORK PLACEMENT NOTE 11/08/2012  Patient:  Heather Lutz, Heather Lutz  Account Number:  [id number] Admit date:  11/04/2012  Clinical Social Worker:  Johnsie Cancel  Date/time:  11/08/2012 11:20 AM  Clinical Social Work is seeking post-discharge placement for this patient at the following level of care:   SKILLED NURSING   (*CSW will update this form in Epic as items are completed)   11/08/2012  Patient/family provided with [facility] Department of Clinical Social Work's list of facilities offering this level of care within the geographic area requested by the patient (or if unable, by the patient's family).  11/08/2012  Patient/family informed of their freedom to choose among providers that offer the needed level of care, that participate in Medicare, Medicaid or managed care program needed by the patient, have an available bed and are willing to accept the patient.  11/08/2012  Patient/family informed of [facility]' ownership interest in [facility], as well as of the fact that they are under no obligation to receive care at this facility.  PASARR submitted to EDS on 11/08/2012 PASARR number received from EDS on 11/08/2012  FL2 transmitted to all facilities in geographic area requested by pt/family on  11/08/2012 FL2 transmitted to all facilities within larger geographic area on   Patient informed that his/her managed care company has contracts with or will negotiate with  certain facilities, including the following:     Patient/family informed of bed offers received:   11/08/2012 Patient chooses bed at [facility] Physician recommends and patient chooses bed at N/A  Patient to be transferred to  [facility] on 11/08/2012   Patient to be transferred to facility by [facility]  The following physician request were entered in Epic:   Additional Comments:  Lia Foyer, LCSWA [facility] Clinical Social Worker Contact #: [phone]

## 2012-11-08 NOTE — Progress Notes (Signed)
Rehab Admissions Coordinator Note:  Patient was screened by Clois Dupes for appropriateness for an Inpatient Acute Rehab Consult. [employer] Medicare will not approve inpt rehab admission for this diagnosis for this admission. They do not admit for history of MS. At this time, we are recommending Skilled Nursing Facility.  Clois Dupes 11/08/2012, 8:41 AM  I can be reached at [phone].

## 2012-11-08 NOTE — Progress Notes (Signed)
Physical Therapy Treatment Patient Details Name: Heather Lutz MRN: [id number] DOB: 12/01/1945 Today's Date: 11/08/2012 Time: 6213-0865 PT Time Calculation (min): 24 min  PT Assessment / Plan / Recommendation Comments on Treatment Session  Patient with improved sitting balance today and able to take steps to get to chair with walker and assist to progress right LE.  Feel needs SNF level rehab (insurance denies CIR stay,) to ensure safest mobility prior to d/c  home.    Follow Up Recommendations  SNF           Equipment Recommendations  None recommended by PT       Frequency Min 4X/week   Plan   discharge plan remains appropriate   Precautions / Restrictions Precautions Precautions: Fall Precaution Comments: droplet isolation   Pertinent Vitals/Pain No current complaints    Mobility  Bed Mobility Bed Mobility: Supine to Sit Supine to Sit: 3: Mod assist;With rails Details for Bed Mobility Assistance: assisted feet/legs over to edge of bed and patient pulled up on rail (min A given at trunk to sit for safety) Transfers Sit to Stand: From bed;1: +2 Total assist Sit to Stand: Patient Percentage: 40% Stand to Sit: To chair/3-in-1;1: +2 Total assist Stand to Sit: Patient Percentage: 30% Details for Transfer Assistance: placed hands on walker (assist to stabilize) and lifting assist to stand, initial balance posterior, then once placed in standing able to balance with mod assist; to sit required lowering assist due to not all they way backed up to chair and sitting with hands still on walker Ambulation/Gait Ambulation/Gait Assistance: 1: +2 Total assist Ambulation/Gait: Patient Percentage: 50% Ambulation Distance (Feet): 2 Feet Assistive device: Rolling walker Ambulation/Gait Assistance Details: took steps to chair with walker and assist to progress right LE    Exercises Other Exercises Other Exercises: PROM to bilateral LE's for breaking up tone; hip and knee flexion  with hip external/internal rotation in hooklying position (one leg at a time.) Other Exercises: seated Long arc quads x 4 each leg; hip flexion with assist for right x 4-5 reps each leg; trunk flexion with forward lean off back of chair arms crossed over chest initially with assist x 5 reps     PT Goals Acute Rehab PT Goals Pt will go Supine/Side to Sit: with min assist PT Goal: Supine/Side to Sit - Progress: Goal set today Pt will go Sit to Stand: with min assist;with upper extremity assist PT Goal: Sit to Stand - Progress: Progressing toward goal Pt will go Stand to Sit: with min assist;with upper extremity assist PT Goal: Stand to Sit - Progress: Progressing toward goal Pt will Transfer Bed to Chair/Chair to Bed: with min assist PT Transfer Goal: Bed to Chair/Chair to Bed - Progress: Progressing toward goal Pt will Ambulate: 16 - 50 feet;with min assist;with rolling walker PT Goal: Ambulate - Progress: Progressing toward goal  Visit Information  Last PT Received On: 11/08/12    Subjective Data  Subjective: Still stiff.  They say I am going home today.   Cognition  Cognition Overall Cognitive Status: Appears within functional limits for tasks assessed/performed Arousal/Alertness: Awake/alert Orientation Level: Appears intact for tasks assessed Behavior During Session: [facility] for tasks performed    Balance  Static Sitting Balance Static Sitting - Balance Support: Right upper extremity supported;Feet unsupported Static Sitting - Level of Assistance: 5: Stand by assistance Static Sitting - Comment/# of Minutes: sat edge of bed with right hand on bedrail and supervision without loss of balance Static Standing Balance  Static Standing - Balance Support: Bilateral upper extremity supported Static Standing - Level of Assistance: 2: Max assist;3: Mod assist Static Standing - Comment/# of Minutes: initially increased assist needed due to posterior loss of balance stood approx 30 seconds   End of Session PT - End of Session Equipment Utilized During Treatment: Gait belt Activity Tolerance: Patient limited by fatigue Patient left: in chair   GP     Keylee Shrestha,CYNDI 11/08/2012, 2:54 PM [place], PT [phone] 11/08/2012

## 2012-11-08 NOTE — Discharge Summary (Addendum)
Physician Discharge Summary  Heather Lutz [place] ZOX:[id number] DOB: 28-Jul-1946 DOA: 11/04/2012  PCP: Dennis Bast, MD  Admit date: 11/04/2012 Discharge date: 11/08/2012  Time spent: 40 minutes  Recommendations for Outpatient Follow-up:  1. Followup with primary care physician in one week  2. Continue Levaquin for 5 more days. 3. She is going to be off of the droplet precautions, she has no fever for the past 3 days, her respiratory symptoms resolved.   Discharge Diagnoses:  Principal Problem:   Influenza A (H1N1) Active Problems:   CAP (community acquired pneumonia)   Hypoxemia   SOB (shortness of breath)   Multiple sclerosis   Hematemesis   Discharge Condition: Stable  Diet recommendation: Regular diet  Filed Weights   11/04/12 1850 11/05/12 0415  Weight: 74.844 kg (165 lb) 75 kg (165 lb 5.5 oz)    History of present illness:  Heather Lutz is a 67 y.o. female with a history of Multiple Sclerosis since 1986 who presented to the ED at [facility] with complaints of Fevers, chills, cough and SOB X 1 day. She has had difficulty coughing up her sputum. In the ED she was found to have hypoxemia with O2 saturations of 87%, which did improve with resting. She has had a poor appetite over the past 24 hours. Her husband reports that she had a pneumovax and antibiotics for a UTI that was prescribed by their PCP 5 days ago.   Hospital Course:   1. Influenza H1N1 A respiratory infection: Patient came in to the hospital with fever, chills and cough. She also have shortness of breath he was found to be hypoxemic in the emergency department with oxygen saturation of 87%. Patient improved with resting and oxygen supplementation. Upon presentation to the emergency department patient was started on Rocephin and azithromycin as well as empiric Tamiflu. Patient flew PCR came back positive for influenza H1N1 A, the Tamiflu continued, the antibiotics was continued down on the day of discharge was  switched to Levaquin to complete 7 days of antibiotics for bronchitis associated with flu infection. Patient is asymptomatic, she is afebrile for the past 3 days.  2. Gastritis: Patient developed nausea and vomiting the very secondary, she does have coffee-ground emesis which is positive for gastric occult blood, Dr. Bosie Clos or fecal GI was kind enough to provide a consultation, he was done EGD on 2/23rd showed hiatal hernia and antral/pyloric gastritis without source of bleeding. I added ranitidine 150 mg twice a day.   3. Chronic constipation: Patient started on MiraLax.  4. Hypoxemia: Secondary to the influenza infection/acute bronchitis, patient treated with when necessary oxygen and inhaled bronchodilators. This is resolved. Patient oxygen saturation is normal on room air.  5. Multiple sclerosis: Stable, no changes, continue patient routine. Patient uses walker and assistance to ambulate at home. She was seen by PT and recommended short-term rehabilitation.  Procedures:  EGD done by Dr. Bosie Clos on 11/06/2012 showed medium-sized hiatal hernia, antral and pyloric gastritis.  Consultations:  [facility]  Discharge Exam: Filed Vitals:   11/07/12 1600 11/07/12 2109 11/08/12 0201 11/08/12 0644  BP: 134/64 132/81 136/79 121/77  Pulse: 84 87 87 95  Temp: 98.7 F (37.1 C) 98.5 F (36.9 C) 99.2 F (37.3 C) 98 F (36.7 C)  TempSrc: Oral Oral Oral Oral  Resp: 18 16 16 18   Height:      Weight:      SpO2: 94% 93% 90% 93%   General: Alert and awake, oriented x3, not in any acute distress.  HEENT: anicteric sclera, pupils reactive to light and accommodation, EOMI CVS: S1-S2 clear, no murmur rubs or gallops Chest: clear to auscultation bilaterally, no wheezing, rales or rhonchi Abdomen: soft nontender, nondistended, normal bowel sounds, no organomegaly Extremities: no cyanosis, clubbing or edema noted bilaterally Neuro: Cranial nerves II-XII intact, no focal neurological  deficits  Discharge Instructions  Discharge Orders   Future Orders Complete By Expires     Diet - low sodium heart healthy  As directed     Increase activity slowly  As directed         Medication List    STOP taking these medications       COD LIVER OIL PO     ZICAM COLD REMEDY PO      TAKE these medications       acetaminophen 325 MG tablet  Commonly known as:  TYLENOL  Take 650 mg by mouth every 6 (six) hours as needed for pain.     ibuprofen 200 MG tablet  Commonly known as:  ADVIL,MOTRIN  Take 200 mg by mouth every 6 (six) hours as needed for pain.     levofloxacin 500 MG tablet  Commonly known as:  LEVAQUIN  Take 1 tablet (500 mg total) by mouth daily.     nitrofurantoin 50 MG capsule  Commonly known as:  MACRODANTIN  Take 50 mg by mouth 4 (four) times daily.     oxybutynin 5 MG tablet  Commonly known as:  DITROPAN  Take 5 mg by mouth daily.     polyethylene glycol packet  Commonly known as:  MIRALAX / GLYCOLAX  Take 17 g by mouth daily.     ranitidine 150 MG tablet  Commonly known as:  ZANTAC  Take 1 tablet (150 mg total) by mouth 2 (two) times daily.     Vitamin D (Ergocalciferol) 50000 UNITS Caps  Commonly known as:  DRISDOL  Take 50,000 Units by mouth every 7 (seven) days. Patient takes this medication on Tuesday and Thursday.           Follow-up Information   Follow up with CABEZA,YURI, MD In 1 week.   Contact information:   [address] [place] [address] [phone]        The results of significant diagnostics from this hospitalization (including imaging, microbiology, ancillary and laboratory) are listed below for reference.    Significant Diagnostic Studies: Dg Abd 1 View  11/05/2012  *RADIOLOGY REPORT*  Clinical Data: Right upper quadrant pain.  ABDOMEN - 1 VIEW  Comparison: None.  Findings: Supine view of the abdomen was obtained.  There is a nonspecific bowel gas pattern.  Few gas-filled loops of bowel in the abdomen.   No significant small bowel dilatation. Calcifications in the pelvis are suggestive for phleboliths.  High density focus in the right upper quadrant is probably associated with bowel or stool but cannot exclude a calcification in this area.  IMPRESSION: Nonspecific bowel gas pattern.  Difficult to exclude right upper quadrant calcifications, as described.   Original Report Authenticated By: Richarda Overlie, M.D.    Dg Chest Port 1 View  11/04/2012  *RADIOLOGY REPORT*  Clinical Data: Cough, chills, chest tightness, shortness of breath.  PORTABLE CHEST - 1 VIEW  Comparison: 07/03/2011  Findings: Normal heart size and pulmonary vascularity.  Linear atelectasis or infiltration in both lung bases.  This is new since the previous study.  No focal airspace consolidation.  No blunting of costophrenic angles.  No pneumothorax.  Mediastinal contours  are intact.  Tortuous aorta.  IMPRESSION: Developing linear infiltration or atelectasis in both lung bases.   Original Report Authenticated By: Burman Nieves, M.D.     Microbiology: No results found for this or any previous visit (from the past 240 hour(s)).   Labs: Basic Metabolic Panel:  Recent Labs Lab 11/04/12 2115 11/06/12 0740 11/07/12 0700  NA 140 140 138  K 4.3 3.9 3.8  CL 105 107 103  CO2 23 26 27   GLUCOSE 118* 88 80  BUN 14 7 7   CREATININE 0.70 0.56 0.54  CALCIUM 9.8 8.7 9.0   Liver Function Tests:  Recent Labs Lab 11/04/12 2115  AST 26  ALT 13  ALKPHOS 112  BILITOT 0.4  PROT 8.2  ALBUMIN 3.8   No results found for this basename: LIPASE, AMYLASE,  in the last 168 hours No results found for this basename: AMMONIA,  in the last 168 hours CBC:  Recent Labs Lab 11/04/12 2115  11/06/12 0740 11/06/12 1844 11/07/12 0700 11/07/12 1801 11/08/12 0828  WBC 7.6  < > 3.4* 3.4* 3.8* 3.2* 4.2  NEUTROABS 6.3  --   --   --   --   --   --   HGB 14.0  < > 11.6* 12.0 12.2 13.0 12.8  HCT 41.3  < > 34.5* 35.5* 36.8 38.0 37.0  MCV 85.5  < > 84.8  84.5 85.4 84.1 83.1  PLT 151  < > 127* 132* 194 150 171  < > = values in this interval not displayed. Cardiac Enzymes: No results found for this basename: CKTOTAL, CKMB, CKMBINDEX, TROPONINI,  in the last 168 hours BNP: BNP (last 3 results) No results found for this basename: PROBNP,  in the last 8760 hours CBG: No results found for this basename: GLUCAP,  in the last 168 hours     Signed:  Dewey Neukam A  [facility] 11/08/2012, 11:59 AM

## 2012-11-09 LAB — CBC
HCT: 36.2 % (ref 36.0–46.0)
Hemoglobin: 12.2 g/dL (ref 12.0–15.0)
MCH: 28.1 pg (ref 26.0–34.0)
MCHC: 33.7 g/dL (ref 30.0–36.0)
MCV: 83.4 fL (ref 78.0–100.0)

## 2012-11-09 NOTE — Clinical Social Work Note (Signed)
CSW was consulted to complete discharge of patient. Pt to transfer to [facility] today via [facility]. Family and facility are aware of d/c. D/C packet complete with chart copy, signed FL2, and signed hard Rx.  CSW signing off as no other CSW needs identified at this time.  Lia Foyer, LCSWA [facility] Clinical Social Worker Contact #: [phone]

## 2012-11-09 NOTE — Progress Notes (Signed)
PATIENT DETAILS Name: Heather Lutz Age: 67 y.o. Sex: female Date of Birth: 11-11-1945 Admit Date: 11/04/2012 Admitting Physician Ron Parker, MD ZOX:[id number],WUJW, MD  Subjective: No major complatins  Assessment/Plan: Influenza H1N1 A respiratory infection -stable -completed Tamiflu therapy -remains afebrile and denies any SOB  Comm Acq PNA -on Rocephin/Zithromax as inpatient -will be transitioned to Levaquin on discharge  Nausea and vomiting -resolved -EGD done on 2/23 showed hiatal hernia and no source of bleeding.  -Tolerating regular diet, change to oral Protonix.  Multiple Sclerosis -stable  Disposition: For transfer to SNF today  DVT Prophylaxis: Prophylactic Lovenox   Code Status: Full code   Procedures:  None  CONSULTS:  None  PHYSICAL EXAM: Vital signs in last 24 hours: Filed Vitals:   11/08/12 0644 11/08/12 1300 11/08/12 2046 11/09/12 0653  BP: 121/77 122/68 106/68 123/77  Pulse: 95 96 95 92  Temp: 98 F (36.7 C) 98.3 F (36.8 C) 98.3 F (36.8 C) 99 F (37.2 C)  TempSrc: Oral  Oral Oral  Resp: 18 18 18 20   Height:      Weight:      SpO2: 93% 95% 94% 92%    Weight change:  Body mass index is 29.3 kg/(m^2).   Gen Exam: Awake and alert with clear speech.   Neck: Supple, No JVD.   Chest: B/L Clear.   CVS: S1 S2 Regular, no murmurs.  Abdomen: soft, BS +, non tender, non distended.  Extremities: no edema, lower extremities warm to touch. Neurologic: Non Focal.   Skin: No Rash.   Wounds: N/A.   Intake/Output from previous day: No intake or output data in the 24 hours ending 11/09/12 1045   LAB RESULTS: CBC  Recent Labs Lab 11/04/12 2115  11/07/12 0700 11/07/12 1801 11/08/12 0828 11/08/12 1811 11/09/12 0724  WBC 7.6  < > 3.8* 3.2* 4.2 3.7* 4.3  HGB 14.0  < > 12.2 13.0 12.8 13.3 12.2  HCT 41.3  < > 36.8 38.0 37.0 38.8 36.2  PLT 151  < > 194 150 171 171 153  MCV 85.5  < > 85.4 84.1 83.1 83.4 83.4  MCH 29.0   < > 28.3 28.8 28.8 28.6 28.1  MCHC 33.9  < > 33.2 34.2 34.6 34.3 33.7  RDW 14.8  < > 14.2 13.9 13.9 14.1 14.1  LYMPHSABS 0.7  --   --   --   --   --   --   MONOABS 0.5  --   --   --   --   --   --   EOSABS 0.1  --   --   --   --   --   --   BASOSABS 0.0  --   --   --   --   --   --   < > = values in this interval not displayed.  Chemistries   Recent Labs Lab 11/04/12 2115 11/06/12 0740 11/07/12 0700  NA 140 140 138  K 4.3 3.9 3.8  CL 105 107 103  CO2 23 26 27   GLUCOSE 118* 88 80  BUN 14 7 7   CREATININE 0.70 0.56 0.54  CALCIUM 9.8 8.7 9.0    CBG: No results found for this basename: GLUCAP,  in the last 168 hours  GFR Estimated Creatinine Clearance: 66.1 ml/min (by C-G formula based on Cr of 0.54).  Coagulation profile No results found for this basename: INR, PROTIME,  in the last 168 hours  Cardiac Enzymes No  results found for this basename: CK, CKMB, TROPONINI, MYOGLOBIN,  in the last 168 hours  No components found with this basename: POCBNP,  No results found for this basename: DDIMER,  in the last 72 hours No results found for this basename: HGBA1C,  in the last 72 hours No results found for this basename: CHOL, HDL, LDLCALC, TRIG, CHOLHDL, LDLDIRECT,  in the last 72 hours No results found for this basename: TSH, T4TOTAL, FREET3, T3FREE, THYROIDAB,  in the last 72 hours No results found for this basename: VITAMINB12, FOLATE, FERRITIN, TIBC, IRON, RETICCTPCT,  in the last 72 hours No results found for this basename: LIPASE, AMYLASE,  in the last 72 hours  Urine Studies No results found for this basename: UACOL, UAPR, USPG, UPH, UTP, UGL, UKET, UBIL, UHGB, UNIT, UROB, ULEU, UEPI, UWBC, URBC, UBAC, CAST, CRYS, UCOM, BILUA,  in the last 72 hours  MICROBIOLOGY: No results found for this or any previous visit (from the past 240 hour(s)).  RADIOLOGY STUDIES/RESULTS: Dg Abd 1 View  11/05/2012  *RADIOLOGY REPORT*  Clinical Data: Right upper quadrant pain.  ABDOMEN - 1  VIEW  Comparison: None.  Findings: Supine view of the abdomen was obtained.  There is a nonspecific bowel gas pattern.  Few gas-filled loops of bowel in the abdomen.  No significant small bowel dilatation. Calcifications in the pelvis are suggestive for phleboliths.  High density focus in the right upper quadrant is probably associated with bowel or stool but cannot exclude a calcification in this area.  IMPRESSION: Nonspecific bowel gas pattern.  Difficult to exclude right upper quadrant calcifications, as described.   Original Report Authenticated By: Richarda Overlie, M.D.    Dg Chest Port 1 View  11/04/2012  *RADIOLOGY REPORT*  Clinical Data: Cough, chills, chest tightness, shortness of breath.  PORTABLE CHEST - 1 VIEW  Comparison: 07/03/2011  Findings: Normal heart size and pulmonary vascularity.  Linear atelectasis or infiltration in both lung bases.  This is new since the previous study.  No focal airspace consolidation.  No blunting of costophrenic angles.  No pneumothorax.  Mediastinal contours are intact.  Tortuous aorta.  IMPRESSION: Developing linear infiltration or atelectasis in both lung bases.   Original Report Authenticated By: Burman Nieves, M.D.     MEDICATIONS: Scheduled Meds: . azithromycin  500 mg Oral Daily  . cefTRIAXone (ROCEPHIN)  IV  1 g Intravenous Q24H  . enoxaparin (LOVENOX) injection  40 mg Subcutaneous Q24H  . guaiFENesin  1,200 mg Oral BID  . pantoprazole  40 mg Oral Daily  . polyethylene glycol  17 g Oral Daily   Continuous Infusions:  PRN Meds:.acetaminophen, acetaminophen, albuterol, alum & mag hydroxide-simeth, chlorpheniramine-HYDROcodone, HYDROmorphone (DILAUDID) injection, ondansetron (ZOFRAN) IV, ondansetron, oxyCODONE, zolpidem  Antibiotics: Anti-infectives   Start     Dose/Rate Route Frequency Ordered Stop   11/08/12 2200  oseltamivir (TAMIFLU) capsule 75 mg     75 mg Oral 2 times daily 11/08/12 1650 11/08/12 2158   11/08/12 0000  levofloxacin (LEVAQUIN)  500 MG tablet     500 mg Oral Daily 11/08/12 1158     11/08/12 0000  oseltamivir (TAMIFLU) 75 MG capsule  Status:  Discontinued     75 mg Oral 2 times daily 11/08/12 1158 11/08/12    11/06/12 1100  azithromycin (ZITHROMAX) tablet 500 mg     500 mg Oral Daily 11/06/12 0926     11/05/12 1200  cefTRIAXone (ROCEPHIN) 1 g in dextrose 5 % 50 mL IVPB     1  g 100 mL/hr over 30 Minutes Intravenous Every 24 hours 11/05/12 1032     11/05/12 1200  azithromycin (ZITHROMAX) 500 mg in dextrose 5 % 250 mL IVPB  Status:  Discontinued     500 mg 250 mL/hr over 60 Minutes Intravenous Every 24 hours 11/05/12 1032 11/06/12 0926   11/05/12 0630  oseltamivir (TAMIFLU) capsule 75 mg  Status:  Discontinued     75 mg Oral 2 times daily 11/05/12 0537 11/08/12 1650   11/04/12 2315  cefTRIAXone (ROCEPHIN) 1 g in dextrose 5 % 50 mL IVPB     1 g 100 mL/hr over 30 Minutes Intravenous  Once 11/04/12 2311 11/05/12 0008   11/04/12 2315  azithromycin (ZITHROMAX) 500 mg in dextrose 5 % 250 mL IVPB     500 mg 250 mL/hr over 60 Minutes Intravenous  Once 11/04/12 2311 11/05/12 0245       Jeoffrey Massed, MD  [facility] Pager:[phone]  If 7PM-7AM, please contact night-coverage [email or web address] Password [email or web address] 11/09/2012, 10:45 AM   LOS: 5 days

## 2012-11-09 NOTE — Progress Notes (Signed)
Pt prepared for d/c to SNF. IV d/c'd. Skin intact except as most recently charted. Vitals are stable. Report called to receiving facility. Pt to be transported by ambulance service. 

## 2013-12-09 ENCOUNTER — Emergency Department (HOSPITAL_BASED_OUTPATIENT_CLINIC_OR_DEPARTMENT_OTHER): Payer: Medicare Other

## 2013-12-09 ENCOUNTER — Encounter (HOSPITAL_BASED_OUTPATIENT_CLINIC_OR_DEPARTMENT_OTHER): Payer: Self-pay | Admitting: Emergency Medicine

## 2013-12-09 ENCOUNTER — Emergency Department (HOSPITAL_BASED_OUTPATIENT_CLINIC_OR_DEPARTMENT_OTHER)
Admission: EM | Admit: 2013-12-09 | Discharge: 2013-12-09 | Disposition: A | Discharge status: Home / Self Care | Payer: Medicare Other | Attending: Emergency Medicine | Admitting: Emergency Medicine

## 2013-12-09 DIAGNOSIS — R600 Localized edema: Secondary | ICD-10-CM

## 2013-12-09 DIAGNOSIS — Z79899 Other long term (current) drug therapy: Secondary | ICD-10-CM | POA: Insufficient documentation

## 2013-12-09 DIAGNOSIS — K219 Gastro-esophageal reflux disease without esophagitis: Secondary | ICD-10-CM | POA: Insufficient documentation

## 2013-12-09 DIAGNOSIS — Z8669 Personal history of other diseases of the nervous system and sense organs: Secondary | ICD-10-CM | POA: Insufficient documentation

## 2013-12-09 DIAGNOSIS — Z8739 Personal history of other diseases of the musculoskeletal system and connective tissue: Secondary | ICD-10-CM | POA: Insufficient documentation

## 2013-12-09 DIAGNOSIS — R609 Edema, unspecified: Secondary | ICD-10-CM | POA: Insufficient documentation

## 2013-12-09 DIAGNOSIS — R6883 Chills (without fever): Secondary | ICD-10-CM | POA: Insufficient documentation

## 2013-12-09 DIAGNOSIS — Z792 Long term (current) use of antibiotics: Secondary | ICD-10-CM | POA: Insufficient documentation

## 2013-12-09 DIAGNOSIS — Z9889 Other specified postprocedural states: Secondary | ICD-10-CM | POA: Insufficient documentation

## 2013-12-09 LAB — COMPREHENSIVE METABOLIC PANEL
ALBUMIN: 3.7 g/dL (ref 3.5–5.2)
ALK PHOS: 115 U/L (ref 39–117)
ALT: 17 U/L (ref 0–35)
AST: 26 U/L (ref 0–37)
BUN: 19 mg/dL (ref 6–23)
CHLORIDE: 104 meq/L (ref 96–112)
CO2: 27 meq/L (ref 19–32)
CREATININE: 0.7 mg/dL (ref 0.50–1.10)
Calcium: 9.8 mg/dL (ref 8.4–10.5)
GFR calc Af Amer: >90 mL/min (ref >90)
GFR, EST NON AFRICAN AMERICAN: 87 mL/min — AB (ref >90)
Glucose, Bld: 98 mg/dL (ref 70–99)
POTASSIUM: 4.1 meq/L (ref 3.7–5.3)
Sodium: 142 mEq/L (ref 137–147)
Total Protein: 8 g/dL (ref 6.0–8.3)

## 2013-12-09 LAB — CBC WITH DIFFERENTIAL/PLATELET
BASOS ABS: 0 10*3/uL (ref 0.0–0.1)
Basophils Relative: 0 % (ref 0–1)
Eosinophils Absolute: 0.1 10*3/uL (ref 0.0–0.7)
Eosinophils Relative: 3 % (ref 0–5)
HEMATOCRIT: 39.5 % (ref 36.0–46.0)
HEMOGLOBIN: 12.9 g/dL (ref 12.0–15.0)
LYMPHS PCT: 34 % (ref 12–46)
Lymphs Abs: 1.7 10*3/uL (ref 0.7–4.0)
MCH: 27.9 pg (ref 26.0–34.0)
MCHC: 32.7 g/dL (ref 30.0–36.0)
MCV: 85.5 fL (ref 78.0–100.0)
MONO ABS: 0.5 10*3/uL (ref 0.1–1.0)
Monocytes Relative: 10 % (ref 3–12)
NEUTROS ABS: 2.6 10*3/uL (ref 1.7–7.7)
NEUTROS PCT: 53 % (ref 43–77)
Platelets: 206 10*3/uL (ref 150–400)
RBC: 4.62 MIL/uL (ref 3.87–5.11)
RDW: 15.1 % (ref 11.5–15.5)
WBC: 4.9 10*3/uL (ref 4.0–10.5)

## 2013-12-09 LAB — PRO B NATRIURETIC PEPTIDE: PRO B NATRI PEPTIDE: 70.6 pg/mL (ref 0–125)

## 2013-12-09 LAB — TROPONIN I: Troponin I: <0.3 ng/mL (ref <0.30)

## 2013-12-09 MED ORDER — POTASSIUM CHLORIDE ER 10 MEQ PO TBCR: 10.0000 meq | EXTENDED_RELEASE_TABLET | Freq: Every day | ORAL | Status: DC

## 2013-12-09 MED ORDER — FUROSEMIDE 20 MG PO TABS: 20.0000 mg | ORAL_TABLET | Freq: Every day | ORAL | Status: DC

## 2013-12-09 MED ORDER — FUROSEMIDE 10 MG/ML IJ SOLN
40.0000 mg | Freq: Once | INTRAMUSCULAR | Status: AC
  Administered 2013-12-09: 40 mg via INTRAVENOUS
  Filled 2013-12-09: qty 4

## 2013-12-09 NOTE — Discharge Instructions (Signed)
Lasix and potassium as prescribed.  Followup with your primary doctor later in the week for a recheck and return to the ER if you develop difficulty breathing or chest pain.   Peripheral Edema You have swelling in your legs (peripheral edema). This swelling is due to excess accumulation of salt and water in your body. Edema may be a sign of heart, kidney or liver disease, or a side effect of a medication. It may also be due to problems in the leg veins. Elevating your legs and using special support stockings may be very helpful, if the cause of the swelling is due to poor venous circulation. Avoid long periods of standing, whatever the cause. Treatment of edema depends on identifying the cause. Chips, pretzels, pickles and other salty foods should be avoided. Restricting salt in your diet is almost always needed. Water pills (diuretics) are often used to remove the excess salt and water from your body via urine. These medicines prevent the kidney from reabsorbing sodium. This increases urine flow. Diuretic treatment may also result in lowering of potassium levels in your body. Potassium supplements may be needed if you have to use diuretics daily. Daily weights can help you keep track of your progress in clearing your edema. You should call your caregiver for follow up care as recommended. SEEK IMMEDIATE MEDICAL CARE IF:   You have increased swelling, pain, redness, or heat in your legs.  You develop shortness of breath, especially when lying down.  You develop chest or abdominal pain, weakness, or fainting.  You have a fever. Document Released: 10/08/2004 Document Revised: 11/23/2011 Document Reviewed: 09/18/2009 [facility] Patient Information 2014 [place].

## 2013-12-09 NOTE — ED Provider Notes (Signed)
CSN: [id number]     Arrival date & time 12/09/13  1905 History  This chart was scribed for No att. providers found by Terressa Koyanagi, ED Scribe. This patient was seen in room MH02/MH02 and the patient's care was started at 8:08 PM.  PCP: Yong Channel, MD  Chief Complaint  Patient presents with  . Leg Swelling   The history is provided by the patient. No language interpreter was used.   HPI Comments: Heather Lutz is a 68 y.o. female, with a history of multiple sclerosis, osteoporosis and GERD, who presents to the Emergency Department complaining of bilateral lower extremities swelling and hardening onset a few months ago and worsening last night. Pt complains of associated chills onset yesterday. Pt reports she took some lasix approximately 6 months ago and it did not alleviate her symptoms. Pt reports she had a heart cath a few years ago and a stress test, both were normal.   Past Medical History  Diagnosis Date  . Multiple sclerosis   . Osteoporosis   . GERD (gastroesophageal reflux disease)    Past Surgical History  Procedure Laterality Date  . Abdominal hysterectomy    . Esophagogastroduodenoscopy N/A 11/06/2012    Procedure: ESOPHAGOGASTRODUODENOSCOPY (EGD);  Surgeon: Lear Ng, MD;  Location: [facility] ENDOSCOPY;  Service: Endoscopy;  Laterality: N/A;   No family history on file. History  Substance Use Topics  . Smoking status: Never Smoker   . Smokeless tobacco: Not on file  . Alcohol Use: No   OB History   Grav Para Term Preterm Abortions TAB SAB Ect Mult Living                 Review of Systems  Constitutional: Positive for chills.  Cardiovascular: Positive for leg swelling (bilateral ).  All other systems reviewed and are negative.   A complete 10 system review of systems was obtained and all systems are negative except as noted in the HPI and PMH.     Allergies  Ace inhibitors; Other; and Epinephrine  Home Medications   Current Outpatient Rx  Name   Route  Sig  Dispense  Refill  . acetaminophen (TYLENOL) 325 MG tablet   Oral   Take 650 mg by mouth every 6 (six) hours as needed for pain.         Marland Kitchen ibuprofen (ADVIL,MOTRIN) 200 MG tablet   Oral   Take 200 mg by mouth every 6 (six) hours as needed for pain.         Marland Kitchen levofloxacin (LEVAQUIN) 500 MG tablet   Oral   Take 1 tablet (500 mg total) by mouth daily.   5 tablet      . nitrofurantoin (MACRODANTIN) 50 MG capsule   Oral   Take 50 mg by mouth 4 (four) times daily.         Marland Kitchen oxybutynin (DITROPAN) 5 MG tablet   Oral   Take 5 mg by mouth daily.           . polyethylene glycol (MIRALAX / GLYCOLAX) packet   Oral   Take 17 g by mouth daily.         . ranitidine (ZANTAC) 150 MG tablet   Oral   Take 1 tablet (150 mg total) by mouth 2 (two) times daily.         . Vitamin D, Ergocalciferol, (DRISDOL) 50000 UNITS CAPS   Oral   Take 50,000 Units by mouth every 7 (seven) days. Patient takes this medication on  Tuesday and Thursday.          Triage Vitals: BP 154/63  Pulse 78  Temp(Src) 97.8 F (36.6 C) (Oral)  Resp 18  Ht 5\' 3"  (1.6 m)  Wt 165 lb (74.844 kg)  BMI 29.24 kg/m2  SpO2 100% Physical Exam  Nursing note and vitals reviewed. Constitutional: She is oriented to person, place, and time. She appears well-developed and well-nourished. No distress.  HENT:  Head: Normocephalic and atraumatic.  Eyes: EOM are normal.  Neck: Neck supple. No tracheal deviation present.  Cardiovascular: Normal rate, regular rhythm and normal heart sounds.   Pulmonary/Chest: Effort normal. No respiratory distress.  Musculoskeletal: Normal range of motion. She exhibits edema.  3+ BLE edema present  Neurological: She is alert and oriented to person, place, and time.  Skin: Skin is warm and dry.  Psychiatric: She has a normal mood and affect. Her behavior is normal.    ED Course  Procedures (including critical care time) DIAGNOSTIC STUDIES: Oxygen Saturation is 100% on  room air, normal by my interpretation.    COORDINATION OF CARE:  8:13 PM-Discussed treatment plan which includes lasix, labs, EKG with pt at bedside and pt agreed to plan.   Labs Review Labs Reviewed - No data to display Imaging Review No results found.   EKG Interpretation   Date/Time:  Saturday December 09 2013 20:42:09 EDT Ventricular Rate:  74 PR Interval:  136 QRS Duration: 76 QT Interval:  370 QTC Calculation: 410 R Axis:   13 Text Interpretation:  Sinus rhythm with Fusion complexes and Premature  atrial complexes with Abberant conduction ST \\T \ T wave abnormality,  consider inferior ischemia Abnormal ECG Confirmed by DELOS  MD, Christerpher Clos  ([id number]) on 12/09/2013 10:29:41 PM      MDM   Final diagnoses:  None    Patient is a 68 year old female who presents with bilateral lower extremity swelling for the past several weeks. His is making her more unstable when she ambulates and is causing her pain. She denies chest pain but does state she felt difficulty breathing several nights ago. On exam, there is 3+ pitting edema present. Workup was initiated including laboratory studies, BNP, troponin, and chest x-ray. These were all essentially unremarkable. Her EKG shows a bundle branch block however no other acute process. She was given IV Lasix with a brisk diuresis. I feel as though she is appropriate for discharge to home with Lasix and potassium. She is to followup with her primary Dr. for a recheck later on this week and return to the ER for symptoms worsen or change.  I personally performed the services described in this documentation, which was scribed in my presence. The recorded information has been reviewed and is accurate.      Veryl Speak, MD 12/09/13 2231

## 2013-12-09 NOTE — ED Notes (Signed)
Reports legs "swollen and hard" for a few weeks- increasing pain this week

## 2014-11-17 ENCOUNTER — Encounter (HOSPITAL_BASED_OUTPATIENT_CLINIC_OR_DEPARTMENT_OTHER): Payer: Self-pay

## 2014-11-17 ENCOUNTER — Emergency Department (HOSPITAL_BASED_OUTPATIENT_CLINIC_OR_DEPARTMENT_OTHER): Payer: Medicare Other

## 2014-11-17 ENCOUNTER — Inpatient Hospital Stay (HOSPITAL_BASED_OUTPATIENT_CLINIC_OR_DEPARTMENT_OTHER)
Admission: EM | Admit: 2014-11-17 | Discharge: 2014-11-20 | DRG: 195 | Disposition: A | Discharge status: Skilled Nursing Facility | Payer: Medicare Other | Attending: Internal Medicine | Admitting: Internal Medicine

## 2014-11-17 DIAGNOSIS — IMO0001 Reserved for inherently not codable concepts without codable children: Secondary | ICD-10-CM

## 2014-11-17 DIAGNOSIS — R600 Localized edema: Secondary | ICD-10-CM | POA: Diagnosis present

## 2014-11-17 DIAGNOSIS — G35 Multiple sclerosis: Secondary | ICD-10-CM | POA: Diagnosis present

## 2014-11-17 DIAGNOSIS — R911 Solitary pulmonary nodule: Secondary | ICD-10-CM | POA: Diagnosis present

## 2014-11-17 DIAGNOSIS — R69 Illness, unspecified: Secondary | ICD-10-CM

## 2014-11-17 DIAGNOSIS — J1100 Influenza due to unidentified influenza virus with unspecified type of pneumonia: Secondary | ICD-10-CM | POA: Diagnosis present

## 2014-11-17 DIAGNOSIS — M810 Age-related osteoporosis without current pathological fracture: Secondary | ICD-10-CM | POA: Diagnosis present

## 2014-11-17 DIAGNOSIS — Z90710 Acquired absence of both cervix and uterus: Secondary | ICD-10-CM

## 2014-11-17 DIAGNOSIS — I878 Other specified disorders of veins: Secondary | ICD-10-CM

## 2014-11-17 DIAGNOSIS — J189 Pneumonia, unspecified organism: Secondary | ICD-10-CM | POA: Diagnosis not present

## 2014-11-17 DIAGNOSIS — K219 Gastro-esophageal reflux disease without esophagitis: Secondary | ICD-10-CM | POA: Diagnosis present

## 2014-11-17 DIAGNOSIS — Z888 Allergy status to other drugs, medicaments and biological substances status: Secondary | ICD-10-CM

## 2014-11-17 DIAGNOSIS — J181 Lobar pneumonia, unspecified organism: Secondary | ICD-10-CM

## 2014-11-17 DIAGNOSIS — J111 Influenza due to unidentified influenza virus with other respiratory manifestations: Secondary | ICD-10-CM | POA: Diagnosis present

## 2014-11-17 DIAGNOSIS — J45909 Unspecified asthma, uncomplicated: Secondary | ICD-10-CM | POA: Diagnosis present

## 2014-11-17 DIAGNOSIS — R079 Chest pain, unspecified: Secondary | ICD-10-CM

## 2014-11-17 DIAGNOSIS — R509 Fever, unspecified: Secondary | ICD-10-CM | POA: Diagnosis present

## 2014-11-17 LAB — CBC WITH DIFFERENTIAL/PLATELET
BASOS PCT: 0 % (ref 0–1)
Basophils Absolute: 0 10*3/uL (ref 0.0–0.1)
EOS PCT: 1 % (ref 0–5)
Eosinophils Absolute: 0.1 10*3/uL (ref 0.0–0.7)
HEMATOCRIT: 40.8 % (ref 36.0–46.0)
HEMOGLOBIN: 13.2 g/dL (ref 12.0–15.0)
LYMPHS ABS: 0.5 10*3/uL — AB (ref 0.7–4.0)
Lymphocytes Relative: 6 % — ABNORMAL LOW (ref 12–46)
MCH: 27.4 pg (ref 26.0–34.0)
MCHC: 32.4 g/dL (ref 30.0–36.0)
MCV: 84.8 fL (ref 78.0–100.0)
Monocytes Absolute: 0.8 10*3/uL (ref 0.1–1.0)
Monocytes Relative: 9 % (ref 3–12)
Neutro Abs: 7.1 10*3/uL (ref 1.7–7.7)
Neutrophils Relative %: 84 % — ABNORMAL HIGH (ref 43–77)
Platelets: 147 10*3/uL — ABNORMAL LOW (ref 150–400)
RBC: 4.81 MIL/uL (ref 3.87–5.11)
RDW: 15.7 % — AB (ref 11.5–15.5)
WBC: 8.5 10*3/uL (ref 4.0–10.5)

## 2014-11-17 LAB — BASIC METABOLIC PANEL
Anion gap: 5 (ref 5–15)
BUN: 13 mg/dL (ref 6–23)
CO2: 26 mmol/L (ref 19–32)
Calcium: 8.6 mg/dL (ref 8.4–10.5)
Chloride: 106 mmol/L (ref 96–112)
Creatinine, Ser: 0.62 mg/dL (ref 0.50–1.10)
GFR calc Af Amer: >90 mL/min (ref >90)
GFR, EST NON AFRICAN AMERICAN: 90 mL/min — AB (ref >90)
Glucose, Bld: 101 mg/dL — ABNORMAL HIGH (ref 70–99)
Potassium: 3.9 mmol/L (ref 3.5–5.1)
Sodium: 137 mmol/L (ref 135–145)

## 2014-11-17 LAB — URINALYSIS, ROUTINE W REFLEX MICROSCOPIC
BILIRUBIN URINE: NEGATIVE
Glucose, UA: NEGATIVE mg/dL
KETONES UR: 15 mg/dL — AB
Leukocytes, UA: NEGATIVE
NITRITE: NEGATIVE
Protein, ur: NEGATIVE mg/dL
Specific Gravity, Urine: 1.012 (ref 1.005–1.030)
UROBILINOGEN UA: 0.2 mg/dL (ref 0.0–1.0)
pH: 7.5 (ref 5.0–8.0)

## 2014-11-17 LAB — URINE MICROSCOPIC-ADD ON

## 2014-11-17 LAB — D-DIMER, QUANTITATIVE: D-Dimer, Quant: 1.16 ug/mL-FEU — ABNORMAL HIGH (ref 0.00–0.48)

## 2014-11-17 LAB — I-STAT CG4 LACTIC ACID, ED: LACTIC ACID, VENOUS: 0.73 mmol/L (ref 0.5–2.0)

## 2014-11-17 MED ORDER — SODIUM CHLORIDE 0.9 % IV BOLUS (SEPSIS)
500.0000 mL | Freq: Once | INTRAVENOUS | Status: AC
  Administered 2014-11-17: 500 mL via INTRAVENOUS

## 2014-11-17 MED ORDER — SODIUM CHLORIDE 0.9 % IV SOLN
INTRAVENOUS | Status: DC
  Administered 2014-11-18: 01:00:00 via INTRAVENOUS

## 2014-11-17 MED ORDER — SODIUM CHLORIDE 0.9 % IV BOLUS (SEPSIS): 500.0000 mL | Freq: Once | INTRAVENOUS | Status: DC

## 2014-11-17 MED ORDER — DEXTROSE 5 % IV SOLN
1.0000 g | Freq: Once | INTRAVENOUS | Status: AC
  Administered 2014-11-17: 1 g via INTRAVENOUS

## 2014-11-17 MED ORDER — IBUPROFEN 400 MG PO TABS
400.0000 mg | ORAL_TABLET | Freq: Once | ORAL | Status: AC
  Administered 2014-11-17: 400 mg via ORAL

## 2014-11-17 MED ORDER — CEFTRIAXONE SODIUM 1 G IJ SOLR
INTRAMUSCULAR | Status: AC
  Filled 2014-11-17: qty 10

## 2014-11-17 MED ORDER — ALBUTEROL SULFATE (2.5 MG/3ML) 0.083% IN NEBU
5.0000 mg | INHALATION_SOLUTION | Freq: Once | RESPIRATORY_TRACT | Status: AC
  Administered 2014-11-17: 5 mg via RESPIRATORY_TRACT
  Filled 2014-11-17: qty 6

## 2014-11-17 MED ORDER — IBUPROFEN 200 MG PO TABS
ORAL_TABLET | ORAL | Status: AC
  Filled 2014-11-17: qty 2

## 2014-11-17 MED ORDER — ACETAMINOPHEN 325 MG PO TABS
ORAL_TABLET | ORAL | Status: AC
  Administered 2014-11-17: 650 mg
  Filled 2014-11-17: qty 2

## 2014-11-17 MED ORDER — IOHEXOL 350 MG/ML SOLN
100.0000 mL | Freq: Once | INTRAVENOUS | Status: AC | PRN
  Administered 2014-11-17: 100 mL via INTRAVENOUS

## 2014-11-17 MED ORDER — DEXTROSE 5 % IV SOLN
500.0000 mg | Freq: Once | INTRAVENOUS | Status: AC
  Administered 2014-11-18: 500 mg via INTRAVENOUS
  Filled 2014-11-17: qty 500

## 2014-11-17 NOTE — ED Provider Notes (Addendum)
CSN: [id number]     Arrival date & time 11/17/14  1454 History  This chart was scribed for Malvin Johns, MD by Dellis Filbert, ED Scribe. The patient was seen in [place] and the patient's care was started at 4:36 PM.  Chief Complaint  Patient presents with  . Influenza   Patient is a 69 y.o. female presenting with flu symptoms. The history is provided by the patient and the spouse. No language interpreter was used.  Influenza Presenting symptoms: cough, fever, headache, shortness of breath and sore throat   Presenting symptoms: no diarrhea, no fatigue, no nausea, no rhinorrhea and no vomiting   Associated symptoms: no chills and no congestion    HPI Comments: Heather Lutz is a 69 y.o. female with a history of asthma brought in by ambulance, who presents to the Emergency Department complaining of fever, SOB, HA, facial pain, bilateral eye pain, chest tightness, non productive, sore throat cough, acute onset yesterday. Pt states she has difficulty controlling her urine. She has taken two aspirin, and alka seltzer plus for relief. She has not traveled outside the country recently but her husband returned from [facility] on Saturday. He was exhibiting similar symptoms but they have subsided. Her husband states in 2014 he went to [facility] and when he returned she developed similar symptoms and was admitted at [facility] for an extended period diagnosed with pneumonia and the flu. Pt does not have an oxygen machine at home. She denies dysuria or urinary frequency.     Past Medical History  Diagnosis Date  . Multiple sclerosis   . Osteoporosis   . GERD (gastroesophageal reflux disease)    Past Surgical History  Procedure Laterality Date  . Abdominal hysterectomy    . Esophagogastroduodenoscopy N/A 11/06/2012    Procedure: ESOPHAGOGASTRODUODENOSCOPY (EGD);  Surgeon: Lear Ng, MD;  Location: [facility] ENDOSCOPY;  Service: Endoscopy;  Laterality: N/A;   No family history on  file. History  Substance Use Topics  . Smoking status: Never Smoker   . Smokeless tobacco: Not on file  . Alcohol Use: No   OB History    No data available     Review of Systems  Constitutional: Positive for fever. Negative for chills, diaphoresis and fatigue.  HENT: Positive for sore throat. Negative for congestion, rhinorrhea and sneezing.   Eyes: Positive for pain.  Respiratory: Positive for cough, chest tightness and shortness of breath.   Cardiovascular: Negative for chest pain and leg swelling.  Gastrointestinal: Negative for nausea, vomiting, abdominal pain, diarrhea and blood in stool.  Genitourinary: Negative for dysuria, frequency, hematuria, flank pain and difficulty urinating.  Musculoskeletal: Negative for back pain and arthralgias.  Skin: Negative for rash.  Neurological: Positive for headaches. Negative for dizziness, speech difficulty, weakness and numbness.    Allergies  Ace inhibitors; Other; and Epinephrine  Home Medications   Prior to Admission medications   Medication Sig Start Date End Date Taking? Authorizing Provider  acetaminophen (TYLENOL) 325 MG tablet Take 650 mg by mouth every 6 (six) hours as needed for pain.    Historical Provider, MD  furosemide (LASIX) 20 MG tablet Take 1 tablet (20 mg total) by mouth daily. 12/09/13   Veryl Speak, MD  ibuprofen (ADVIL,MOTRIN) 200 MG tablet Take 200 mg by mouth every 6 (six) hours as needed for pain.    Historical Provider, MD  levofloxacin (LEVAQUIN) 500 MG tablet Take 1 tablet (500 mg total) by mouth daily. 11/08/12   Verlee Monte, MD  nitrofurantoin (MACRODANTIN) 50 MG capsule Take 50 mg by mouth 4 (four) times daily.    Historical Provider, MD  oxybutynin (DITROPAN) 5 MG tablet Take 5 mg by mouth daily.      Historical Provider, MD  polyethylene glycol (MIRALAX / GLYCOLAX) packet Take 17 g by mouth daily. 11/08/12   Verlee Monte, MD  potassium chloride (K-DUR) 10 MEQ tablet Take 1 tablet (10 mEq total) by mouth  daily. 12/09/13   Veryl Speak, MD  ranitidine (ZANTAC) 150 MG tablet Take 1 tablet (150 mg total) by mouth 2 (two) times daily. 11/08/12   Verlee Monte, MD  Vitamin D, Ergocalciferol, (DRISDOL) 50000 UNITS CAPS Take 50,000 Units by mouth every 7 (seven) days. Patient takes this medication on Tuesday and Thursday.    Historical Provider, MD   BP 125/57 mmHg  Pulse 115  Temp(Src) 101.8 F (38.8 C) (Oral)  Resp 20  Ht 5\' 3"  (1.6 m)  Wt 160 lb (72.576 kg)  BMI 28.35 kg/m2  SpO2 93% Physical Exam  Constitutional: She is oriented to person, place, and time. She appears well-developed and well-nourished.  HENT:  Head: Normocephalic and atraumatic.  Mouth/Throat: Oropharynx is clear and moist.  Eyes: Pupils are equal, round, and reactive to light.  Neck: Normal range of motion. Neck supple.  No meningismus  Cardiovascular: Normal rate, regular rhythm and normal heart sounds.   Pulmonary/Chest: Effort normal. No respiratory distress. She has wheezes (mild expiratory wheezes). She has no rales. She exhibits no tenderness.  Abdominal: Soft. Bowel sounds are normal. There is no tenderness. There is no rebound and no guarding.  Musculoskeletal: Normal range of motion. She exhibits edema (bilateral pitting edema).  Lymphadenopathy:    She has no cervical adenopathy.  Neurological: She is alert and oriented to person, place, and time.  Skin: Skin is warm and dry. No rash noted.  Psychiatric: She has a normal mood and affect.    ED Course  Procedures  DIAGNOSTIC STUDIES: Oxygen Saturation is 97% on room air, normal by my interpretation.    COORDINATION OF CARE: 4:47 PM Discussed treatment plan with pt at bedside and pt agreed to plan.  Results for orders placed or performed during the hospital encounter of 11/17/14  CBC with Differential  Result Value Ref Range   WBC 8.5 4.0 - 10.5 K/uL   RBC 4.81 3.87 - 5.11 MIL/uL   Hemoglobin 13.2 12.0 - 15.0 g/dL   HCT 40.8 36.0 - 46.0 %   MCV 84.8  78.0 - 100.0 fL   MCH 27.4 26.0 - 34.0 pg   MCHC 32.4 30.0 - 36.0 g/dL   RDW 15.7 (H) 11.5 - 15.5 %   Platelets 147 (L) 150 - 400 K/uL   Neutrophils Relative % 84 (H) 43 - 77 %   Neutro Abs 7.1 1.7 - 7.7 K/uL   Lymphocytes Relative 6 (L) 12 - 46 %   Lymphs Abs 0.5 (L) 0.7 - 4.0 K/uL   Monocytes Relative 9 3 - 12 %   Monocytes Absolute 0.8 0.1 - 1.0 K/uL   Eosinophils Relative 1 0 - 5 %   Eosinophils Absolute 0.1 0.0 - 0.7 K/uL   Basophils Relative 0 0 - 1 %   Basophils Absolute 0.0 0.0 - 0.1 K/uL  Urinalysis, Routine w reflex microscopic  Result Value Ref Range   Color, Urine YELLOW YELLOW   APPearance CLEAR CLEAR   Specific Gravity, Urine 1.012 1.005 - 1.030   pH 7.5 5.0 - 8.0  Glucose, UA NEGATIVE NEGATIVE mg/dL   Hgb urine dipstick SMALL (A) NEGATIVE   Bilirubin Urine NEGATIVE NEGATIVE   Ketones, ur 15 (A) NEGATIVE mg/dL   Protein, ur NEGATIVE NEGATIVE mg/dL   Urobilinogen, UA 0.2 0.0 - 1.0 mg/dL   Nitrite NEGATIVE NEGATIVE   Leukocytes, UA NEGATIVE NEGATIVE  Urine microscopic-add on  Result Value Ref Range   Squamous Epithelial / LPF RARE RARE   WBC, UA 0-2 <3 WBC/hpf   RBC / HPF 7-10 <3 RBC/hpf   Bacteria, UA FEW (A) RARE  Basic metabolic panel  Result Value Ref Range   Sodium 137 135 - 145 mmol/L   Potassium 3.9 3.5 - 5.1 mmol/L   Chloride 106 96 - 112 mmol/L   CO2 26 19 - 32 mmol/L   Glucose, Bld 101 (H) 70 - 99 mg/dL   BUN 13 6 - 23 mg/dL   Creatinine, Ser 0.62 0.50 - 1.10 mg/dL   Calcium 8.6 8.4 - 10.5 mg/dL   GFR calc non Af Amer 90 (L) >90 mL/min   GFR calc Af Amer >90 >90 mL/min   Anion gap 5 5 - 15  D-dimer, quantitative  Result Value Ref Range   D-Dimer, Quant 1.16 (H) 0.00 - 0.48 ug/mL-FEU  Troponin I  Result Value Ref Range   Troponin I 0.04 (H) <0.031 ng/mL  I-Stat CG4 Lactic Acid, ED  Result Value Ref Range   Lactic Acid, Venous 0.73 0.5 - 2.0 mmol/L   Dg Chest 2 View  11/17/2014   CLINICAL DATA:  Shortness of breath.  Cough.  EXAM: CHEST   2 VIEW  COMPARISON:  12/09/2013  FINDINGS: The heart size and mediastinal contours are within normal limits. Both lungs are clear. No evidence of pleural effusion. Small hiatal hernia noted.  IMPRESSION: No active cardiopulmonary disease.  Small hiatal hernia.   Electronically Signed   By: Earle Gell M.D.   On: 11/17/2014 17:12   Ct Angio Chest Pe W/cm &/or Wo Cm  11/17/2014   CLINICAL DATA:  Acute onset of fever, shortness of breath, headache, facial pain and bilateral eye pain. Chest tightness. Sore throat and cough. Initial encounter.  EXAM: CT ANGIOGRAPHY CHEST WITH CONTRAST  TECHNIQUE: Multidetector CT imaging of the chest was performed using the standard protocol during bolus administration of intravenous contrast. Multiplanar CT image reconstructions and MIPs were obtained to evaluate the vascular anatomy.  CONTRAST:  162mL OMNIPAQUE IOHEXOL 350 MG/ML SOLN  COMPARISON:  Chest radiograph performed earlier today at 3:59 p.m.  FINDINGS: There is no evidence of pulmonary embolus.  Left basilar airspace consolidation is compatible with pneumonia. Mild right basilar atelectasis is noted. Mild bronchiectasis is noted at the right lung base. A vague 5 mm nodule is noted within the right upper lobe (image 41 of 80). No pleural effusion or pneumothorax is seen.  A moderate hiatal hernia is noted. The mediastinum is otherwise unremarkable. No mediastinal lymphadenopathy is seen. No pericardial effusion is identified. The great vessels are grossly unremarkable in appearance. No axillary lymphadenopathy is seen. The visualized portions of the thyroid gland are unremarkable in appearance.  The visualized portions of the liver and spleen are unremarkable.  No acute osseous abnormalities are seen.  Review of the MIP images confirms the above findings.  IMPRESSION: 1. No evidence of pulmonary embolus. 2. Left lower lobe pneumonia noted, likely explaining the patient's symptoms. 3. Mild right basilar atelectasis seen. Mild  bronchiectasis at the right lung base. 4. 5 mm nodule within the  right upper lobe. If the patient is at high risk for bronchogenic carcinoma, follow-up chest CT at 6-12 months is recommended. If the patient is at low risk for bronchogenic carcinoma, follow-up chest CT at 12 months is recommended. This recommendation follows the consensus statement: Guidelines for Management of Small Pulmonary Nodules Detected on CT Scans: A Statement from the [employer] as published in Radiology 2005;237:395-400. 5. Moderate hiatal hernia noted.   Electronically Signed   By: Garald Balding M.D.   On: 11/17/2014 22:48    MDM   Final diagnoses:  Chest pain  Community acquired pneumonia   Patient presents with flulike symptoms. Her chest x-ray does not show pneumonia but she's been persistently tachycardic despite treatment of her fever and despite IV fluids. Her oxygen saturations have been borderline in the low 90s. She is now on a nasal cannula at 2 L and she is satting in the mid 90s on a nasal cannula 2 L. She has no increased work of breathing. Given her persistent tachycardia I checked a d-dimer which was elevated and proceeded with a CT angiogram of her chest given her high risk of BTE due to her relative immobility. There is no evidence of pulmonary embolus however there was left lower lobe pneumonia. She has no recent hospitalizations. She was treated with Rocephin and Zithromax for community-acquired pneumonia. She's been given nebulizer treatments in the ED and still feels intermittently tight across her chest which she feels is consistent with her asthma. I feel that she needs to be admitted for IV fluids and antibiotics given her underlying morbidity.  Her lactate and BP have been normal.  Her troponin is minimally elevated, but her EKG does not show ischemia.  I spoke with Dr. Maudie Mercury who has accepted the pt for transfer to [facility].  I personally performed the services described in this documentation, which  was scribed in my presence.  The recorded information has been reviewed and considered.      Malvin Johns, MD 11/18/14 8416  Malvin Johns, MD 11/18/14 [phone]

## 2014-11-17 NOTE — ED Notes (Signed)
Arrived by EMS with flu-like symptoms from home x 1 day. Complains of headache and fever for same. Alert and oriented

## 2014-11-17 NOTE — ED Notes (Signed)
Code sepsis discussed with Dr Tamera Punt no order at this time Lactic Acid being drawn at this time.

## 2014-11-17 NOTE — ED Notes (Signed)
Husband recently with similar symptoms.  Pt with 2 days of cough/congestion/fever.  Denies n/v/d.

## 2014-11-18 ENCOUNTER — Encounter (HOSPITAL_COMMUNITY): Payer: Self-pay

## 2014-11-18 DIAGNOSIS — R69 Illness, unspecified: Secondary | ICD-10-CM

## 2014-11-18 DIAGNOSIS — J111 Influenza due to unidentified influenza virus with other respiratory manifestations: Secondary | ICD-10-CM

## 2014-11-18 DIAGNOSIS — J189 Pneumonia, unspecified organism: Principal | ICD-10-CM

## 2014-11-18 LAB — INFLUENZA PANEL BY PCR (TYPE A & B)
H1N1FLUPCR: NOT DETECTED
Influenza A By PCR: NEGATIVE
Influenza B By PCR: NEGATIVE

## 2014-11-18 LAB — STREP PNEUMONIAE URINARY ANTIGEN: STREP PNEUMO URINARY ANTIGEN: NEGATIVE

## 2014-11-18 LAB — TROPONIN I: Troponin I: 0.04 ng/mL — ABNORMAL HIGH (ref <0.031)

## 2014-11-18 MED ORDER — HEPARIN SODIUM (PORCINE) 5000 UNIT/ML IJ SOLN
5000.0000 [IU] | Freq: Three times a day (TID) | INTRAMUSCULAR | Status: DC
  Administered 2014-11-18 – 2014-11-20 (×6): 5000 [IU] via SUBCUTANEOUS
  Filled 2014-11-18 (×6): qty 1

## 2014-11-18 MED ORDER — ASPIRIN 81 MG PO CHEW
324.0000 mg | CHEWABLE_TABLET | Freq: Once | ORAL | Status: AC
  Administered 2014-11-18: 324 mg via ORAL
  Filled 2014-11-18: qty 4

## 2014-11-18 MED ORDER — MENTHOL 3 MG MT LOZG
1.0000 | LOZENGE | OROMUCOSAL | Status: DC | PRN
  Administered 2014-11-19: 3 mg via ORAL
  Filled 2014-11-18: qty 9

## 2014-11-18 MED ORDER — BRIMONIDINE TARTRATE-TIMOLOL 0.2-0.5 % OP SOLN
1.0000 [drp] | Freq: Two times a day (BID) | OPHTHALMIC | Status: DC
  Administered 2014-11-18 – 2014-11-20 (×4): 1 [drp] via OPHTHALMIC

## 2014-11-18 MED ORDER — CEFTRIAXONE SODIUM IN DEXTROSE 20 MG/ML IV SOLN
1.0000 g | INTRAVENOUS | Status: DC
  Administered 2014-11-18 – 2014-11-19 (×2): 1 g via INTRAVENOUS
  Filled 2014-11-18 (×3): qty 50

## 2014-11-18 MED ORDER — DEXTROSE 5 % IV SOLN
500.0000 mg | INTRAVENOUS | Status: DC
  Administered 2014-11-18: 500 mg via INTRAVENOUS
  Filled 2014-11-18 (×2): qty 500

## 2014-11-18 MED ORDER — OSELTAMIVIR PHOSPHATE 75 MG PO CAPS
75.0000 mg | ORAL_CAPSULE | Freq: Two times a day (BID) | ORAL | Status: DC
  Administered 2014-11-18: 75 mg via ORAL
  Filled 2014-11-18 (×2): qty 1

## 2014-11-18 MED ORDER — IBUPROFEN 200 MG PO TABS
400.0000 mg | ORAL_TABLET | ORAL | Status: DC | PRN
  Administered 2014-11-18 (×2): 400 mg via ORAL
  Filled 2014-11-18 (×2): qty 2

## 2014-11-18 MED ORDER — LATANOPROST 0.005 % OP SOLN
1.0000 [drp] | Freq: Every day | OPHTHALMIC | Status: DC
  Administered 2014-11-18 – 2014-11-19 (×2): 1 [drp] via OPHTHALMIC

## 2014-11-18 MED ORDER — ACETAMINOPHEN 325 MG PO TABS
650.0000 mg | ORAL_TABLET | Freq: Four times a day (QID) | ORAL | Status: DC | PRN
Start: 2014-11-18 — End: 2014-11-20
  Administered 2014-11-19 – 2014-11-20 (×2): 650 mg via ORAL
  Filled 2014-11-18 (×2): qty 2

## 2014-11-18 NOTE — Progress Notes (Signed)
NURSING PROGRESS NOTE  DORINE DUFFEY [id number] Admission Data: 11/18/2014 5:11 AM Attending Provider: Jani Gravel, MD VCB:SWHQPR,FFMB, MD Code Status: Full  Heather Lutz is a 69 y.o. female patient admitted from ED:  -No acute distress noted.  -No complaints of shortness of breath.  -No complaints of chest pain.  .  Blood pressure 138/69, pulse 85, temperature 98.5 F (36.9 C), temperature source Oral, resp. rate 15, height 5\' 3"  (1.6 m), weight 72.576 kg (160 lb), SpO2 100 %.   IV Fluids:  IV in place, occlusive dsg intact without redness, IV cath hand right, condition patent and no redness none.   Allergies:  Ace inhibitors; Other; and Epinephrine  Past Medical History:   has a past medical history of Multiple sclerosis; Osteoporosis; and GERD (gastroesophageal reflux disease).  Past Surgical History:   has past surgical history that includes Abdominal hysterectomy and Esophagogastroduodenoscopy (N/A, 11/06/2012).  Social History:   reports that she has never smoked. She does not have any smokeless tobacco history on file. She reports that she does not drink alcohol or use illicit drugs.  Skin: WDL  Patient oriented to room. Information packet given to patient. Admission inpatient armband information verified with patient  to include name and date of birth and placed on patient arm. Side rails up x 2, fall assessment and education completed with patient. Patient able to verbalize understanding of risk associated with falls and verbalized understanding to call for assistance before getting out of bed. Call light within reach. Patient able to voice and demonstrate understanding of unit orientation instructions.

## 2014-11-18 NOTE — Progress Notes (Signed)
Spoke with RN Mortimer Fries at [facility] regarding blood cultures. Report was given that blood cultures had been drawn. Confirmed that cultures were drawn prior to administration of antibiotics and available for testing. New order discontinued, samples that had already been obtained will be tested. Will continue to monitor.

## 2014-11-18 NOTE — Plan of Care (Signed)
Problem: Phase III Progression Outcomes Goal: O2 sats > or equal to 93% on room air Outcome: Completed/Met Date Met:  11/18/14 Tolerating 2L O2. O2 Sats above 93% with activity and rest. Parsons, Junris L 4:29 PM      

## 2014-11-18 NOTE — Progress Notes (Signed)
[facility] PROGRESS NOTE  ROBBYE DEDE XKG:[id number] DOB: 12/06/1945 DOA: 11/17/2014 PCP: Yong Channel, MD  Assessment/Plan: 69 y/o female with PMH of Multiple Sclerosis, h/o pneumonia presented with fever,c hills, shortness of breath, associate with mild productive cough. Her husband had similar symptoms, who returned form recent trip.   1. CAP-L lung pneumonia; started on IV atx, tamiflu pend influenza test, blood cultures; cont bronchodilators as needed  2. MS, with difficulty in ambulation. No new symptoms;  3. Chronic leg edema, likely dependant edema with inability to ambulate; patient is not on diuretics at this time; -will cont supportive care, leg elevation  4. 5 mm nodule within the right upper lobe. If the patient is at igh risk for bronchogenic carcinoma, follow-up chest CT at 6-12 months is recommended    Code Status: full Family Communication: d/w patient (indicate person spoken with, relationship, and if by phone, the number) Disposition Plan: home pend clinical improvement    Consultants:  none  Procedures:  none  Antibiotics:  Ceftriaxone3/6>>>  Azithromycin 3/6<<<<   (indicate start date, and stop date if known)  HPI/Subjective: alert  Objective: Filed Vitals:   11/18/14 0451  BP: 138/69  Pulse: 85  Temp: 98.5 F (36.9 C)  Resp: 15   No intake or output data in the 24 hours ending 11/18/14 0912 Filed Weights   11/17/14 1508 11/18/14 0451  Weight: 72.576 kg (160 lb) 77.883 kg (171 lb 11.2 oz)    Exam:   General:  alert  Cardiovascular: s1,s2 rrr  Respiratory: few rales L lung   Abdomen: soft, nt,nd   Musculoskeletal: + pitting edema BL legs    Data Reviewed: Basic Metabolic Panel:  Recent Labs Lab 11/17/14 1850  NA 137  K 3.9  CL 106  CO2 26  GLUCOSE 101*  BUN 13  CREATININE 0.62  CALCIUM 8.6   Liver Function Tests: No results for input(s): AST, ALT, ALKPHOS, BILITOT, PROT, ALBUMIN in the last 168  hours. No results for input(s): LIPASE, AMYLASE in the last 168 hours. No results for input(s): AMMONIA in the last 168 hours. CBC:  Recent Labs Lab 11/17/14 1735  WBC 8.5  NEUTROABS 7.1  HGB 13.2  HCT 40.8  MCV 84.8  PLT 147*   Cardiac Enzymes:  Recent Labs Lab 11/17/14 2335  TROPONINI 0.04*   BNP (last 3 results) No results for input(s): BNP in the last 8760 hours.  ProBNP (last 3 results)  Recent Labs  12/09/13 2055  PROBNP 70.6    CBG: No results for input(s): GLUCAP in the last 168 hours.  No results found for this or any previous visit (from the past 240 hour(s)).   Studies: Dg Chest 2 View  11/17/2014   CLINICAL DATA:  Shortness of breath.  Cough.  EXAM: CHEST  2 VIEW  COMPARISON:  12/09/2013  FINDINGS: The heart size and mediastinal contours are within normal limits. Both lungs are clear. No evidence of pleural effusion. Small hiatal hernia noted.  IMPRESSION: No active cardiopulmonary disease.  Small hiatal hernia.   Electronically Signed   By: Earle Gell M.D.   On: 11/17/2014 17:12   Ct Angio Chest Pe W/cm &/or Wo Cm  11/17/2014   CLINICAL DATA:  Acute onset of fever, shortness of breath, headache, facial pain and bilateral eye pain. Chest tightness. Sore throat and cough. Initial encounter.  EXAM: CT ANGIOGRAPHY CHEST WITH CONTRAST  TECHNIQUE: Multidetector CT imaging of the chest was performed using the standard protocol during bolus administration  of intravenous contrast. Multiplanar CT image reconstructions and MIPs were obtained to evaluate the vascular anatomy.  CONTRAST:  161m OMNIPAQUE IOHEXOL 350 MG/ML SOLN  COMPARISON:  Chest radiograph performed earlier today at 3:59 p.m.  FINDINGS: There is no evidence of pulmonary embolus.  Left basilar airspace consolidation is compatible with pneumonia. Mild right basilar atelectasis is noted. Mild bronchiectasis is noted at the right lung base. A vague 5 mm nodule is noted within the right upper lobe (image 41 of  80). No pleural effusion or pneumothorax is seen.  A moderate hiatal hernia is noted. The mediastinum is otherwise unremarkable. No mediastinal lymphadenopathy is seen. No pericardial effusion is identified. The great vessels are grossly unremarkable in appearance. No axillary lymphadenopathy is seen. The visualized portions of the thyroid gland are unremarkable in appearance.  The visualized portions of the liver and spleen are unremarkable.  No acute osseous abnormalities are seen.  Review of the MIP images confirms the above findings.  IMPRESSION: 1. No evidence of pulmonary embolus. 2. Left lower lobe pneumonia noted, likely explaining the patient's symptoms. 3. Mild right basilar atelectasis seen. Mild bronchiectasis at the right lung base. 4. 5 mm nodule within the right upper lobe. If the patient is at high risk for bronchogenic carcinoma, follow-up chest CT at 6-12 months is recommended. If the patient is at low risk for bronchogenic carcinoma, follow-up chest CT at 12 months is recommended. This recommendation follows the consensus statement: Guidelines for Management of Small Pulmonary Nodules Detected on CT Scans: A Statement from the [employer] published in Radiology 2005;237:395-400. 5. Moderate hiatal hernia noted.   Electronically Signed   By: JGarald BaldingM.D.   On: 11/17/2014 22:48    Scheduled Meds: . azithromycin  500 mg Intravenous Q24H  . cefTRIAXone (ROCEPHIN)  IV  1 g Intravenous Q24H  . heparin  5,000 Units Subcutaneous 3 times per day  . oseltamivir  75 mg Oral BID  . sodium chloride  500 mL Intravenous Once   Continuous Infusions: . sodium chloride 50 mL/hr at 11/18/14 0033    Principal Problem:   Left lower lobe pneumonia Active Problems:   CAP (community acquired pneumonia)   Influenza-like illness    Time spent: >35 minutes     BKinnie Feil [facility] Pager [phone] If 7PM-7AM, please contact night-coverage at [email or web address], password  [email or web address]3/02/2015, 9:12 AM  LOS: 1 day

## 2014-11-18 NOTE — H&P (Signed)
[facility] History and Physical  Heather Lutz [id number]:[id number] DOB: 1946-07-27 DOA: 11/17/2014  Referring physician: EDP PCP: Yong Channel, MD   Chief Complaint: ILI   HPI: Heather Lutz is a 69 y.o. female who presents to ED with c/o fever, SOB, headache, diffuse mucle aches, chest tightness, cough that is non-productive, and sore throat.  2 ASA and alkaseltzer plus have provided no relief.  No recent travel but her husband did return from [facility] on Saturday.  He had similar symptoms but they have subsided.  Ironically the same trip by her husband in 2014 after he returned she wound up getting admitted with influenza A and PNA at that time.  Review of Systems: Systems reviewed.  As above, otherwise negative  Past Medical History  Diagnosis Date  . Multiple sclerosis   . Osteoporosis   . GERD (gastroesophageal reflux disease)    Past Surgical History  Procedure Laterality Date  . Abdominal hysterectomy    . Esophagogastroduodenoscopy N/A 11/06/2012    Procedure: ESOPHAGOGASTRODUODENOSCOPY (EGD);  Surgeon: Lear Ng, MD;  Location: [facility] ENDOSCOPY;  Service: Endoscopy;  Laterality: N/A;   Social History:  reports that she has never smoked. She does not have any smokeless tobacco history on file. She reports that she does not drink alcohol or use illicit drugs.  Allergies  Allergen Reactions  . Ace Inhibitors   . Other Itching    Walnuts, honeydew, watermelon, catanlope, bananas  . Epinephrine Anxiety    Hallucinations     History reviewed. No pertinent family history.   Prior to Admission medications   Medication Sig Start Date End Date Taking? Authorizing Provider  acetaminophen (TYLENOL) 325 MG tablet Take 650 mg by mouth every 6 (six) hours as needed for pain.    Historical Provider, MD  furosemide (LASIX) 20 MG tablet Take 1 tablet (20 mg total) by mouth daily. 12/09/13   Veryl Speak, MD  ibuprofen (ADVIL,MOTRIN) 200 MG tablet Take 200 mg by mouth  every 6 (six) hours as needed for pain.    Historical Provider, MD  oxybutynin (DITROPAN) 5 MG tablet Take 5 mg by mouth daily.      Historical Provider, MD  polyethylene glycol (MIRALAX / GLYCOLAX) packet Take 17 g by mouth daily. 11/08/12   Verlee Monte, MD  potassium chloride (K-DUR) 10 MEQ tablet Take 1 tablet (10 mEq total) by mouth daily. 12/09/13   Veryl Speak, MD  ranitidine (ZANTAC) 150 MG tablet Take 1 tablet (150 mg total) by mouth 2 (two) times daily. 11/08/12   Verlee Monte, MD  Vitamin D, Ergocalciferol, (DRISDOL) 50000 UNITS CAPS Take 50,000 Units by mouth every 7 (seven) days. Patient takes this medication on Tuesday and Thursday.    Historical Provider, MD   Physical Exam: Filed Vitals:   11/18/14 0451  BP: 138/69  Pulse: 85  Temp: 98.5 F (36.9 C)  Resp: 15    BP 138/69 mmHg  Pulse 85  Temp(Src) 98.5 F (36.9 C) (Oral)  Resp 15  Ht 5' 2.4" (1.585 m)  Wt 77.883 kg (171 lb 11.2 oz)  BMI 31.00 kg/m2  SpO2 100%  General Appearance:    Alert, oriented, no distress, appears stated age  Head:    Normocephalic, atraumatic  Eyes:    PERRL, EOMI, sclera non-icteric        Nose:   Nares without drainage or epistaxis. Mucosa, turbinates normal  Throat:   Moist mucous membranes. Oropharynx without erythema or exudate.  Neck:   Supple.  No carotid bruits.  No thyromegaly.  No lymphadenopathy.   Back:     No CVA tenderness, no spinal tenderness  Lungs:     Clear to auscultation bilaterally, without wheezes, rhonchi or rales  Chest wall:    No tenderness to palpitation  Heart:    Regular rate and rhythm without murmurs, gallops, rubs  Abdomen:     Soft, non-tender, nondistended, normal bowel sounds, no organomegaly  Genitalia:    deferred  Rectal:    deferred  Extremities:   No clubbing, cyanosis or edema.  Pulses:   2+ and symmetric all extremities  Skin:   Skin color, texture, turgor normal, no rashes or lesions  Lymph nodes:   Cervical, supraclavicular, and axillary  nodes normal  Neurologic:   CNII-XII intact. Normal strength, sensation and reflexes      throughout    Labs on Admission:  Basic Metabolic Panel:  Recent Labs Lab 11/17/14 1850  NA 137  K 3.9  CL 106  CO2 26  GLUCOSE 101*  BUN 13  CREATININE 0.62  CALCIUM 8.6   Liver Function Tests: No results for input(s): AST, ALT, ALKPHOS, BILITOT, PROT, ALBUMIN in the last 168 hours. No results for input(s): LIPASE, AMYLASE in the last 168 hours. No results for input(s): AMMONIA in the last 168 hours. CBC:  Recent Labs Lab 11/17/14 1735  WBC 8.5  NEUTROABS 7.1  HGB 13.2  HCT 40.8  MCV 84.8  PLT 147*   Cardiac Enzymes:  Recent Labs Lab 11/17/14 2335  TROPONINI 0.04*    BNP (last 3 results)  Recent Labs  12/09/13 2055  PROBNP 70.6   CBG: No results for input(s): GLUCAP in the last 168 hours.  Radiological Exams on Admission: Dg Chest 2 View  11/17/2014   CLINICAL DATA:  Shortness of breath.  Cough.  EXAM: CHEST  2 VIEW  COMPARISON:  12/09/2013  FINDINGS: The heart size and mediastinal contours are within normal limits. Both lungs are clear. No evidence of pleural effusion. Small hiatal hernia noted.  IMPRESSION: No active cardiopulmonary disease.  Small hiatal hernia.   Electronically Signed   By: Earle Gell M.D.   On: 11/17/2014 17:12   Ct Angio Chest Pe W/cm &/or Wo Cm  11/17/2014   CLINICAL DATA:  Acute onset of fever, shortness of breath, headache, facial pain and bilateral eye pain. Chest tightness. Sore throat and cough. Initial encounter.  EXAM: CT ANGIOGRAPHY CHEST WITH CONTRAST  TECHNIQUE: Multidetector CT imaging of the chest was performed using the standard protocol during bolus administration of intravenous contrast. Multiplanar CT image reconstructions and MIPs were obtained to evaluate the vascular anatomy.  CONTRAST:  141mL OMNIPAQUE IOHEXOL 350 MG/ML SOLN  COMPARISON:  Chest radiograph performed earlier today at 3:59 p.m.  FINDINGS: There is no evidence of  pulmonary embolus.  Left basilar airspace consolidation is compatible with pneumonia. Mild right basilar atelectasis is noted. Mild bronchiectasis is noted at the right lung base. A vague 5 mm nodule is noted within the right upper lobe (image 41 of 80). No pleural effusion or pneumothorax is seen.  A moderate hiatal hernia is noted. The mediastinum is otherwise unremarkable. No mediastinal lymphadenopathy is seen. No pericardial effusion is identified. The great vessels are grossly unremarkable in appearance. No axillary lymphadenopathy is seen. The visualized portions of the thyroid gland are unremarkable in appearance.  The visualized portions of the liver and spleen are unremarkable.  No acute osseous abnormalities are seen.  Review of the  MIP images confirms the above findings.  IMPRESSION: 1. No evidence of pulmonary embolus. 2. Left lower lobe pneumonia noted, likely explaining the patient's symptoms. 3. Mild right basilar atelectasis seen. Mild bronchiectasis at the right lung base. 4. 5 mm nodule within the right upper lobe. If the patient is at high risk for bronchogenic carcinoma, follow-up chest CT at 6-12 months is recommended. If the patient is at low risk for bronchogenic carcinoma, follow-up chest CT at 12 months is recommended. This recommendation follows the consensus statement: Guidelines for Management of Small Pulmonary Nodules Detected on CT Scans: A Statement from the [employer] as published in Radiology 2005;237:395-400. 5. Moderate hiatal hernia noted.   Electronically Signed   By: Garald Balding M.D.   On: 11/17/2014 22:48    EKG: Independently reviewed.  Assessment/Plan Principal Problem:   Left lower lobe pneumonia Active Problems:   CAP (community acquired pneumonia)   Influenza-like illness   1. LLL CAP - 1. PNA pathway 2. Cultures pending 3. Rocephin and azithromycin 4. Tylenol for fever 5. Ibuprofen for pain 2. ILI - 1. Influenza PCR pending 2. Empiric  tamiflu started for now    Code Status: Full Code  Family Communication: No family in room Disposition Plan: Admit to inpatient   Time spent: 15 min  GARDNER, JARED M. [facility] Pager [phone]  If 7AM-7PM, please contact the day team taking care of the patient [email or web address] Password [email or web address] 11/18/2014, 5:30 AM

## 2014-11-18 NOTE — ED Notes (Addendum)
Husband: (Ray) leaving at this time. Phone [phone]. Husband emphasizes "she cannot walk".

## 2014-11-19 DIAGNOSIS — G35 Multiple sclerosis: Secondary | ICD-10-CM

## 2014-11-19 LAB — LEGIONELLA ANTIGEN, URINE

## 2014-11-19 LAB — GLUCOSE, CAPILLARY: Glucose-Capillary: 95 mg/dL (ref 70–99)

## 2014-11-19 LAB — HIV ANTIBODY (ROUTINE TESTING W REFLEX): HIV Screen 4th Generation wRfx: NONREACTIVE

## 2014-11-19 MED ORDER — AZITHROMYCIN 500 MG PO TABS
500.0000 mg | ORAL_TABLET | Freq: Every day | ORAL | Status: DC
  Administered 2014-11-20: 500 mg via ORAL
  Filled 2014-11-19 (×3): qty 1

## 2014-11-19 MED ORDER — AZITHROMYCIN 600 MG PO TABS
600.0000 mg | ORAL_TABLET | Freq: Every day | ORAL | Status: DC
  Filled 2014-11-19: qty 1

## 2014-11-19 NOTE — Evaluation (Signed)
Physical Therapy Evaluation Patient Details Name: Heather Lutz MRN: [id number] DOB: 1945/11/21 Today's Date: 11/19/2014   History of Present Illness  Heather Lutz is a 69 y.o. female who presents to ED with c/o fever, SOB, headache, diffuse mucle aches, chest tightness, cough that is non-productive, and sore throat. 2 ASA and alkaseltzer plus have provided no relief. No recent travel but her husband did return from [facility] on Saturday. He had similar symptoms but they have subsided. Ironically the same trip by her husband in 2014 after he returned she wound up getting admitted with influenza A and PNA at that time..  Significant PMHx  MS (for many years.)  Clinical Impression  Pt admitted with/for the above s/s above which have in turn cause some weakness due to unmasking symptom of MS.  Pt currently limited functionally due to the problems listed. ( See problems list.)   Pt will benefit from PT to maximize function and safety in order to get ready for next venue listed below.     Follow Up Recommendations SNF    Equipment Recommendations  None recommended by PT    Recommendations for Other Services       Precautions / Restrictions Precautions Precautions: Fall      Mobility  Bed Mobility Overal bed mobility: Needs Assistance Bed Mobility: Rolling;Supine to Sit Rolling: Max assist (both directions during pericare before transfer to chair)   Supine to sit: Max assist     General bed mobility comments: total assist to bridge to EOB; significant truncal assist to come up via L UE.  Transfers Overall transfer level: Needs assistance   Transfers: Squat Pivot Transfers     Squat pivot transfers: Total assist     General transfer comment: total assist face to face transfer with +2 available for safety.  Ambulation/Gait                Stairs            Wheelchair Mobility    Modified Rankin (Stroke Patients Only)       Balance  Overall balance assessment: Needs assistance Sitting-balance support: Bilateral upper extremity supported Sitting balance-Leahy Scale: Poor Sitting balance - Comments: able to maintain sitting EOB with both UE assist, but not without UE's                                     Pertinent Vitals/Pain Pain Assessment: Faces Faces Pain Scale: Hurts little more Pain Location: some discomfort with LE ROM Pain Descriptors / Indicators: Grimacing Pain Intervention(s): Monitored during session    Home Living Family/patient expects to be discharged to:: Private residence Living Arrangements: Spouse/significant other (son lives with parents to help out) Available Help at Discharge: Family;Personal care attendant (2 days /week) Type of Home: House Home Access: Ramped entrance     Home Layout: One level Home Equipment: Transport planner;Wheelchair - power;Wheelchair - manual;Bedside commode Additional Comments: Sleeps in a lift recliner, assisted to toilet, sponge bathe lower body and general transfers.  Stays in power chair days when family working and until lunch when son comes home for lunch to assist Mom.    Prior Function Level of Independence: Needs assistance   Gait / Transfers Assistance Needed: needs transfer assist  ADL's / Homemaking Assistance Needed: needs some assist from family or PCA 2 days/wk        Hand Dominance  Extremity/Trunk Assessment   Upper Extremity Assessment: Defer to OT evaluation (general weakness bil, but able to prop in sitting)           Lower Extremity Assessment: Generalized weakness;RLE deficits/detail;LLE deficits/detail RLE Deficits / Details: stiff and weak at 2 for df/pf, hams and hip flexors to 3-/5 for gross extension LLE Deficits / Details: see strength R LE  L notably stronger than R LE, but not significantly more functional.     Communication   Communication: No difficulties  Cognition   Behavior During Therapy:  WFL for tasks assessed/performed Overall Cognitive Status: Within Functional Limits for tasks assessed                      General Comments General comments (skin integrity, edema, etc.): Bilateral LE ROM with gently stretch.    Exercises        Assessment/Plan    PT Assessment Patient needs continued PT services  PT Diagnosis Generalized weakness   PT Problem List Decreased strength;Decreased range of motion;Decreased activity tolerance;Decreased balance;Decreased mobility;Decreased coordination;Decreased knowledge of precautions  PT Treatment Interventions DME instruction;Functional mobility training;Therapeutic activities;Balance training;Patient/family education   PT Goals (Current goals can be found in the Care Plan section) Acute Rehab PT Goals Patient Stated Goal: back home able to be able to stay by myself when needed. PT Goal Formulation: With patient Time For Goal Achievement: 12/03/14 Potential to Achieve Goals: Good    Frequency Min 3X/week   Barriers to discharge        Co-evaluation               End of Session   Activity Tolerance: Patient tolerated treatment well Patient left: in chair;with call bell/phone within reach;with chair alarm set;Other (comment) (lift pad in place.) Nurse Communication: Mobility status;Need for lift equipment         Time: 3235-5732 PT Time Calculation (min) (ACUTE ONLY): 51 min   Charges:   PT Evaluation $Initial PT Evaluation Tier I: 1 Procedure PT Treatments $Therapeutic Activity: 23-37 mins   PT G Codes:        Joachim Carton, Tessie Fass 11/19/2014, 1:12 PM 11/19/2014  Donnella Sham, PT [phone]  (pager)

## 2014-11-19 NOTE — Progress Notes (Addendum)
[facility] PROGRESS NOTE  PENNELOPE BASQUE VQQ:[id number] DOB: August 22, 1946 DOA: 11/17/2014 PCP: Yong Channel, MD   HPI/Subjective: Continues to have cough with sputum. Very weak.   Assessment/Plan: 69 y/o female with PMH of Multiple Sclerosis, h/o pneumonia presented with fever,c hills, shortness of breath, associate with mild productive cough. Her husband had similar symptoms, who returned form recent trip.   1. CAP-L lung pneumonia; started on IV Rocephin and Zithromax- influenza negative- Tamiflu stopped 3/6, blood cultures neg; cont bronchodilators as needed - weaned off of O2- 92% on RA 2. MS, with difficulty in ambulation. PT eval today. Per RN, patient was too weak to get out of bed with her.  3. Chronic leg edema, likely dependant edema with inability to ambulate; patient is not on diuretics at this time; -will cont supportive care, leg elevation  4. 5 mm nodule within the right upper lobe. If the patient is at igh risk for bronchogenic carcinoma, follow-up chest CT at 6-12 months is recommended 5. Mildly elevated TNI- likely due to pneumonia- no chest pain- no further work up    Code Status: full Family Communication: d/w patient  Disposition Plan: based upon PT eval will go home vs SNF   Consultants:  none  Procedures:  none  Antibiotics:  Ceftriaxone3/6>>>  Azithromycin 3/6<<<<   Objective: Filed Vitals:   11/19/14 0400  BP: 128/62  Pulse:   Temp: 99.2 F (37.3 C)  Resp: 22    Intake/Output Summary (Last 24 hours) at 11/19/14 1307 Last data filed at 11/19/14 0947  Gross per 24 hour  Intake    240 ml  Output      0 ml  Net    240 ml   Filed Weights   11/17/14 1508 11/18/14 0451  Weight: 72.576 kg (160 lb) 77.883 kg (171 lb 11.2 oz)    Exam:   General:  AAO x 3  Cardiovascular: s1,s2 rrr  Respiratory: CTA b/l   Abdomen: soft, nt,nd   Musculoskeletal: no pitting edema BL legs    Data Reviewed: Basic Metabolic Panel:  Recent  Labs Lab 11/17/14 1850  NA 137  K 3.9  CL 106  CO2 26  GLUCOSE 101*  BUN 13  CREATININE 0.62  CALCIUM 8.6   Liver Function Tests: No results for input(s): AST, ALT, ALKPHOS, BILITOT, PROT, ALBUMIN in the last 168 hours. No results for input(s): LIPASE, AMYLASE in the last 168 hours. No results for input(s): AMMONIA in the last 168 hours. CBC:  Recent Labs Lab 11/17/14 1735  WBC 8.5  NEUTROABS 7.1  HGB 13.2  HCT 40.8  MCV 84.8  PLT 147*   Cardiac Enzymes:  Recent Labs Lab 11/17/14 2335  TROPONINI 0.04*   BNP (last 3 results) No results for input(s): BNP in the last 8760 hours.  ProBNP (last 3 results)  Recent Labs  12/09/13 2055  PROBNP 70.6    CBG: No results for input(s): GLUCAP in the last 168 hours.  Recent Results (from the past 240 hour(s))  Culture, blood (single)     Status: None (Preliminary result)   Collection Time: 11/17/14 11:35 PM  Result Value Ref Range Status   Specimen Description BLOOD RIGHT ANTECUBITAL  Final   Special Requests   Final    BOTTLES DRAWN AEROBIC AND ANAEROBIC AER 10cc ANA 10cc   Culture   Final           BLOOD CULTURE RECEIVED NO GROWTH TO DATE CULTURE WILL BE HELD FOR 5 DAYS BEFORE  ISSUING A FINAL NEGATIVE REPORT Performed at [facility]    Report Status PENDING  Incomplete     Studies: Dg Chest 2 View  11/17/2014   CLINICAL DATA:  Shortness of breath.  Cough.  EXAM: CHEST  2 VIEW  COMPARISON:  12/09/2013  FINDINGS: The heart size and mediastinal contours are within normal limits. Both lungs are clear. No evidence of pleural effusion. Small hiatal hernia noted.  IMPRESSION: No active cardiopulmonary disease.  Small hiatal hernia.   Electronically Signed   By: Earle Gell M.D.   On: 11/17/2014 17:12   Ct Angio Chest Pe W/cm &/or Wo Cm  11/17/2014   CLINICAL DATA:  Acute onset of fever, shortness of breath, headache, facial pain and bilateral eye pain. Chest tightness. Sore throat and cough. Initial encounter.   EXAM: CT ANGIOGRAPHY CHEST WITH CONTRAST  TECHNIQUE: Multidetector CT imaging of the chest was performed using the standard protocol during bolus administration of intravenous contrast. Multiplanar CT image reconstructions and MIPs were obtained to evaluate the vascular anatomy.  CONTRAST:  111m OMNIPAQUE IOHEXOL 350 MG/ML SOLN  COMPARISON:  Chest radiograph performed earlier today at 3:59 p.m.  FINDINGS: There is no evidence of pulmonary embolus.  Left basilar airspace consolidation is compatible with pneumonia. Mild right basilar atelectasis is noted. Mild bronchiectasis is noted at the right lung base. A vague 5 mm nodule is noted within the right upper lobe (image 41 of 80). No pleural effusion or pneumothorax is seen.  A moderate hiatal hernia is noted. The mediastinum is otherwise unremarkable. No mediastinal lymphadenopathy is seen. No pericardial effusion is identified. The great vessels are grossly unremarkable in appearance. No axillary lymphadenopathy is seen. The visualized portions of the thyroid gland are unremarkable in appearance.  The visualized portions of the liver and spleen are unremarkable.  No acute osseous abnormalities are seen.  Review of the MIP images confirms the above findings.  IMPRESSION: 1. No evidence of pulmonary embolus. 2. Left lower lobe pneumonia noted, likely explaining the patient's symptoms. 3. Mild right basilar atelectasis seen. Mild bronchiectasis at the right lung base. 4. 5 mm nodule within the right upper lobe. If the patient is at high risk for bronchogenic carcinoma, follow-up chest CT at 6-12 months is recommended. If the patient is at low risk for bronchogenic carcinoma, follow-up chest CT at 12 months is recommended. This recommendation follows the consensus statement: Guidelines for Management of Small Pulmonary Nodules Detected on CT Scans: A Statement from the [employer] published in Radiology 2005;237:395-400. 5. Moderate hiatal hernia noted.    Electronically Signed   By: JGarald BaldingM.D.   On: 11/17/2014 22:48    Scheduled Meds: . azithromycin  500 mg Oral QHS  . brimonidine-timolol  1 drop Both Eyes Q12H  . cefTRIAXone (ROCEPHIN)  IV  1 g Intravenous Q24H  . heparin  5,000 Units Subcutaneous 3 times per day  . latanoprost  1 drop Both Eyes QHS  . sodium chloride  500 mL Intravenous Once   Continuous Infusions:     Time spent: 35 minutes     Aashvi Rezabek, MD  [facility] Pager: [email or web address], password [email or web address]3/03/2015, 1:07 PM  LOS: 2 days

## 2014-11-19 NOTE — Clinical Social Work Psychosocial (Signed)
Clinical Social Work Department BRIEF PSYCHOSOCIAL ASSESSMENT 11/19/2014  Patient:  Heather Lutz, Heather Lutz     Account Number:  [id number]     Admit date:  11/17/2014  Clinical Social Worker:  Lovey Newcomer  Date/Time:  11/19/2014 02:58 PM  Referred by:  Physician  Date Referred:  11/19/2014 Referred for  SNF Placement   Other Referral:   NA   Interview type:  Patient Other interview type:   Patient alert and oriented at time of assessment.    PSYCHOSOCIAL DATA Living Status:  FAMILY Admitted from facility:   Level of care:   Primary support name:  Ray and Alger Primary support relationship to patient:  FAMILY Degree of support available:   Support is strong.    CURRENT CONCERNS Current Concerns  Post-Acute Placement   Other Concerns:   NA    SOCIAL WORK ASSESSMENT / PLAN CSW met with patient at bedside to complete assessment. Patient appears to be in a good mood and present with normal affect and willingness to engage with CSW. Patient reports that she lives at home with her husband Heather Lutz and son Heather Lutz. Per patient, she feels to weak to return home at discharge and states that she is in agreement with SNF placement once medically stable.    Patient reports that she has been to [facility] in the past and had a positive experience there. She was also told about [facility] and thinks this may be an option, as her brother is currently there. Patient shared a lot about her brother and his health issues over the years. She appears saddened by the decline in his health. Patient seems to have good insight into the reason for her admission/diagnosis as she is able to adequately articulate why she is here. CSW explained SNF search/placement process and answered patient's questions. CSW will follow up with bed offers once available.   Assessment/plan status:  Psychosocial Support/Ongoing Assessment of Needs Other assessment/ plan:   Complete Fl2, Fax, PASRR    Information/referral to community resources:   CSW contact information and SNF list given.    PATIENT'S/FAMILY'S RESPONSE TO PLAN OF CARE: Patient is agreeable to SNF placement at discharge. Patient appears to be optimistic about rehab and her potential to return home after short term rehab stay. CSW will continue to follow.       Liz Beach MSW, [place], [place], [phone]

## 2014-11-19 NOTE — Clinical Social Work Placement (Signed)
Clinical Social Work Department CLINICAL SOCIAL WORK PLACEMENT NOTE 11/19/2014  Patient:  Heather Lutz, Heather Lutz  Account Number:  [id number] Admit date:  11/17/2014  Clinical Social Worker:  Kemper Durie, [place]  Date/time:  11/19/2014 05:04 PM  Clinical Social Work is seeking post-discharge placement for this patient at the following level of care:   SKILLED NURSING   (*CSW will update this form in Epic as items are completed)   11/19/2014  Patient/family provided with [place] Department of Clinical Social Work's list of facilities offering this level of care within the geographic area requested by the patient (or if unable, by the patient's family).  11/19/2014  Patient/family informed of their freedom to choose among providers that offer the needed level of care, that participate in Medicare, Medicaid or managed care program needed by the patient, have an available bed and are willing to accept the patient.  11/19/2014  Patient/family informed of [facility]' ownership interest in [facility], as well as of the fact that they are under no obligation to receive care at this facility.  PASARR submitted to EDS on 11/19/2014 PASARR number received on 11/19/2014  FL2 transmitted to all facilities in geographic area requested by pt/family on  11/19/2014 FL2 transmitted to all facilities within larger geographic area on   Patient informed that his/her managed care company has contracts with or will negotiate with  certain facilities, including the following:     Patient/family informed of bed offers received:   Patient chooses bed at  Physician recommends and patient chooses bed at    Patient to be transferred to  on   Patient to be transferred to facility by  Patient and family notified of transfer on  Name of family member notified:    The following physician request were entered in Epic:   Additional Comments:   Liz Beach MSW, [place], [place],  [phone]

## 2014-11-20 DIAGNOSIS — R911 Solitary pulmonary nodule: Secondary | ICD-10-CM

## 2014-11-20 DIAGNOSIS — I878 Other specified disorders of veins: Secondary | ICD-10-CM

## 2014-11-20 DIAGNOSIS — IMO0001 Reserved for inherently not codable concepts without codable children: Secondary | ICD-10-CM

## 2014-11-20 MED ORDER — LEVOFLOXACIN 500 MG PO TABS
500.0000 mg | ORAL_TABLET | Freq: Every day | ORAL | Status: DC
  Filled 2014-11-20: qty 1

## 2014-11-20 MED ORDER — LEVOFLOXACIN 750 MG PO TABS
750.0000 mg | ORAL_TABLET | Freq: Every day | ORAL | Status: DC
  Administered 2014-11-20: 750 mg via ORAL
  Filled 2014-11-20: qty 1

## 2014-11-20 MED ORDER — BENZONATATE 100 MG PO CAPS: 100.0000 mg | ORAL_CAPSULE | Freq: Three times a day (TID) | ORAL | Status: DC | PRN

## 2014-11-20 MED ORDER — LEVOFLOXACIN 750 MG PO TABS: 750.0000 mg | ORAL_TABLET | Freq: Every day | ORAL | Status: DC

## 2014-11-20 MED ORDER — BENZONATATE 100 MG PO CAPS
100.0000 mg | ORAL_CAPSULE | Freq: Three times a day (TID) | ORAL | Status: DC | PRN
  Filled 2014-11-20: qty 1

## 2014-11-20 MED ORDER — MENTHOL 3 MG MT LOZG: 1.0000 | LOZENGE | OROMUCOSAL | Status: DC | PRN

## 2014-11-20 MED ORDER — BRIMONIDINE TARTRATE-TIMOLOL 0.2-0.5 % OP SOLN: 1.0000 [drp] | Freq: Two times a day (BID) | OPHTHALMIC | Status: DC

## 2014-11-20 NOTE — Discharge Summary (Signed)
Physician Discharge Summary  Heather Lutz XHB:[id number] DOB: 1946/01/17 DOA: 11/17/2014  PCP: Yong Channel, MD  Admit date: 11/17/2014 Discharge date: 11/20/2014  Time spent: 45 minutes  Recommendations for Outpatient Follow-up:  1. 5 mm lung nodule- needs f/u   2. Needs to see PCP in 1 wk- please make an appt  Discharge Condition: stable Diet recommendation: heart healthy  Discharge Diagnoses:  Principal Problem:   Left lower lobe pneumonia Active Problems:   CAP (community acquired pneumonia)   Multiple sclerosis Chronic pedal edema due to venous stasis  Lung Nodule  History of present illness:  Heather Lutz is a 69 y.o. female who presents to ED with c/o fever, SOB, headache, diffuse mucle aches, chest tightness, cough that is non-productive, and sore throat. 2 ASA and alkaseltzer plus have provided no relief. No recent travel but her husband did return from [facility] on Saturday. He had similar symptoms but they have subsided. Ironically the same trip by her husband in 2014 after he returned she wound up getting admitted with influenza A and PNA at that time.  Hospital Course:  1. CAP-L lung pneumonia; started on IV Rocephin and Zithromax- transition to Levaquin on d/c today to complete a 7 day course- influenza negative- Tamiflu stopped 3/6, blood cultures neg;- weaned off of O2- 96% on RA 2. MS, with difficulty in ambulation. PT eval - recommending SNF- patient and husband in agreement  3. Chronic leg edema, likely dependant edema with inability to ambulate; patient is not on diuretics at this time; -will cont supportive care, leg elevation - edema has resolved completely with this- patient advised to do this at home 4. 5 mm nodule within the right upper lobe. If the patient is at igh risk for bronchogenic carcinoma, follow-up chest CT at 6-12 months is recommended 5. Mildly elevated TNI- likely due to pneumonia- no chest pain- no further work  up  Procedures:  none  Consultations:  none  Discharge Exam: Filed Weights   11/17/14 1508 11/18/14 0451  Weight: 72.576 kg (160 lb) 77.883 kg (171 lb 11.2 oz)   Filed Vitals:   11/20/14 0516  BP: 140/71  Pulse:   Temp: 98.6 F (37 C)  Resp: 16    General: AAO x 3, no distress Cardiovascular: RRR, no murmurs  Respiratory: clear to auscultation bilaterally GI: soft, non-tender, non-distended, bowel sound positive  Discharge Instructions You were cared for by a hospitalist during your hospital stay. If you have any questions about your discharge medications or the care you received while you were in the hospital after you are discharged, you can call the unit and asked to speak with the hospitalist on call if the hospitalist that took care of you is not available. Once you are discharged, your primary care physician will handle any further medical issues. Please note that NO REFILLS for any discharge medications will be authorized once you are discharged, as it is imperative that you return to your primary care physician (or establish a relationship with a primary care physician if you do not have one) for your aftercare needs so that they can reassess your need for medications and monitor your lab values.     Medication List    STOP taking these medications - she has not been taking these meds       ibuprofen 200 MG tablet  Commonly known as:  ADVIL,MOTRIN     oxybutynin 5 MG tablet  Commonly known as:  DITROPAN  polyethylene glycol packet  Commonly known as:  MIRALAX / GLYCOLAX     Vitamin D (Ergocalciferol) 50000 UNITS Caps capsule  Commonly known as:  DRISDOL      TAKE these medications        acetaminophen 325 MG tablet  Commonly known as:  TYLENOL  Take 650 mg by mouth every 6 (six) hours as needed for pain.     benzonatate 100 MG capsule  Commonly known as:  TESSALON PERLES  Take 1 capsule (100 mg total) by mouth 3 (three) times daily as needed for  cough.     brimonidine-timolol 0.2-0.5 % ophthalmic solution  Commonly known as:  COMBIGAN  Place 1 drop into both eyes every 12 (twelve) hours.     COD LIVER OIL PO  Take 1 capsule by mouth daily.     latanoprost 0.005 % ophthalmic solution  Commonly known as:  XALATAN  Place 1 drop into both eyes at bedtime.     levofloxacin 750 MG tablet  Commonly known as:  LEVAQUIN  Take 1 tablet (750 mg total) by mouth daily.     menthol-cetylpyridinium 3 MG lozenge  Commonly known as:  CEPACOL  Take 1 lozenge (3 mg total) by mouth as needed for sore throat.     multivitamin with minerals Tabs tablet  Take 1 tablet by mouth daily.     naproxen sodium 220 MG tablet  Commonly known as:  ANAPROX  Take 220-440 mg by mouth 2 (two) times daily as needed (pain).     potassium chloride 10 MEQ tablet  Commonly known as:  K-DUR  Take 1 tablet (10 mEq total) by mouth daily.     vitamin C 1000 MG tablet  Take 1,500 mg by mouth daily.     Vitamin D3 5000 UNITS Caps  Take 5,000 Units by mouth daily.       Allergies  Allergen Reactions  . Ace Inhibitors Other (See Comments)    unknown  . Other Itching    Walnuts, honeydew, watermelon, catanlope, bananas  . Epinephrine Anxiety    Hallucinations        Follow-up Information    Follow up with CABEZA,YURI, MD.   Specialty:  Internal Medicine   Contact information:   [address] [place] [address] [phone]        The results of significant diagnostics from this hospitalization (including imaging, microbiology, ancillary and laboratory) are listed below for reference.    Significant Diagnostic Studies: Dg Chest 2 View  11/17/2014   CLINICAL DATA:  Shortness of breath.  Cough.  EXAM: CHEST  2 VIEW  COMPARISON:  12/09/2013  FINDINGS: The heart size and mediastinal contours are within normal limits. Both lungs are clear. No evidence of pleural effusion. Small hiatal hernia noted.  IMPRESSION: No active  cardiopulmonary disease.  Small hiatal hernia.   Electronically Signed   By: Earle Gell M.D.   On: 11/17/2014 17:12   Ct Angio Chest Pe W/cm &/or Wo Cm  11/17/2014   CLINICAL DATA:  Acute onset of fever, shortness of breath, headache, facial pain and bilateral eye pain. Chest tightness. Sore throat and cough. Initial encounter.  EXAM: CT ANGIOGRAPHY CHEST WITH CONTRAST  TECHNIQUE: Multidetector CT imaging of the chest was performed using the standard protocol during bolus administration of intravenous contrast. Multiplanar CT image reconstructions and MIPs were obtained to evaluate the vascular anatomy.  CONTRAST:  140m OMNIPAQUE IOHEXOL 350 MG/ML SOLN  COMPARISON:  Chest  radiograph performed earlier today at 3:59 p.m.  FINDINGS: There is no evidence of pulmonary embolus.  Left basilar airspace consolidation is compatible with pneumonia. Mild right basilar atelectasis is noted. Mild bronchiectasis is noted at the right lung base. A vague 5 mm nodule is noted within the right upper lobe (image 41 of 80). No pleural effusion or pneumothorax is seen.  A moderate hiatal hernia is noted. The mediastinum is otherwise unremarkable. No mediastinal lymphadenopathy is seen. No pericardial effusion is identified. The great vessels are grossly unremarkable in appearance. No axillary lymphadenopathy is seen. The visualized portions of the thyroid gland are unremarkable in appearance.  The visualized portions of the liver and spleen are unremarkable.  No acute osseous abnormalities are seen.  Review of the MIP images confirms the above findings.  IMPRESSION: 1. No evidence of pulmonary embolus. 2. Left lower lobe pneumonia noted, likely explaining the patient's symptoms. 3. Mild right basilar atelectasis seen. Mild bronchiectasis at the right lung base. 4. 5 mm nodule within the right upper lobe. If the patient is at high risk for bronchogenic carcinoma, follow-up chest CT at 6-12 months is recommended. If the patient is at  low risk for bronchogenic carcinoma, follow-up chest CT at 12 months is recommended. This recommendation follows the consensus statement: Guidelines for Management of Small Pulmonary Nodules Detected on CT Scans: A Statement from the [employer] as published in Radiology 2005;237:395-400. 5. Moderate hiatal hernia noted.   Electronically Signed   By: Garald Balding M.D.   On: 11/17/2014 22:48    Microbiology: Recent Results (from the past 240 hour(s))  Culture, blood (single)     Status: None (Preliminary result)   Collection Time: 11/17/14 11:35 PM  Result Value Ref Range Status   Specimen Description BLOOD RIGHT ANTECUBITAL  Final   Special Requests   Final    BOTTLES DRAWN AEROBIC AND ANAEROBIC AER 10cc ANA 10cc   Culture   Final           BLOOD CULTURE RECEIVED NO GROWTH TO DATE CULTURE WILL BE HELD FOR 5 DAYS BEFORE ISSUING A FINAL NEGATIVE REPORT Performed at [facility]    Report Status PENDING  Incomplete     Labs: Basic Metabolic Panel:  Recent Labs Lab 11/17/14 1850  NA 137  K 3.9  CL 106  CO2 26  GLUCOSE 101*  BUN 13  CREATININE 0.62  CALCIUM 8.6   Liver Function Tests: No results for input(s): AST, ALT, ALKPHOS, BILITOT, PROT, ALBUMIN in the last 168 hours. No results for input(s): LIPASE, AMYLASE in the last 168 hours. No results for input(s): AMMONIA in the last 168 hours. CBC:  Recent Labs Lab 11/17/14 1735  WBC 8.5  NEUTROABS 7.1  HGB 13.2  HCT 40.8  MCV 84.8  PLT 147*   Cardiac Enzymes:  Recent Labs Lab 11/17/14 2335  TROPONINI 0.04*   BNP: BNP (last 3 results) No results for input(s): BNP in the last 8760 hours.  ProBNP (last 3 results)  Recent Labs  12/09/13 2055  PROBNP 70.6    CBG:  Recent Labs Lab 11/19/14 2207  GLUCAP 95       Signed:  Debbe Odea, MD [facility] 11/20/2014, 9:33 AM

## 2014-11-20 NOTE — Clinical Social Work Placement (Addendum)
Clinical Social Work Department CLINICAL SOCIAL WORK PLACEMENT NOTE 11/19/2014  Patient: Heather Lutz, Heather Lutz Account Number: [id number] Admit date: 11/17/2014  Clinical Social Worker: Kemper Durie, [place] Date/time: 11/19/2014 05:04 PM  Clinical Social Work is seeking post-discharge placement for this patient at the following level of care: SKILLED NURSING (*CSW will update this form in Epic as items are completed)   11/19/2014 Patient/family provided with [facility] Department of Clinical Social Work's list of facilities offering this level of care within the geographic area requested by the patient (or if unable, by the patient's family).  11/19/2014 Patient/family informed of their freedom to choose among providers that offer the needed level of care, that participate in Medicare, Medicaid or managed care program needed by the patient, have an available bed and are willing to accept the patient.  11/19/2014 Patient/family informed of [facility]' ownership interest in [facility], as well as of the fact that they are under no obligation to receive care at this facility.  PASARR submitted to EDS on 11/19/2014 PASARR number received on 11/19/2014  FL2 transmitted to all facilities in geographic area requested by pt/family on 11/19/2014 FL2 transmitted to all facilities within larger geographic area on   Patient informed that his/her managed care company has contracts with or will negotiate with certain facilities, including the following:    Patient/family informed of bed offers received: 11/20/14 Patient chooses bed at [place] recommends and patient chooses bed at   Patient to be transferred to [facility] on 11/20/14 Patient to be transferred to facility by [facility] Patient and family notified of transfer on 11/20/14 Name of family member notified: Ray  The following physician request were entered in Epic:   Additional  Comments: Per MD patient ready for DC to [facility]. RN, patient, patient's family, and facility notified of DC. RN given number for report. DC packet on chart. [facility] transport requested for patient. CSW signing off.    UPDATE: CSW attempted to reach patient's husband to inform of time of pickup, patient states that she has his personal number and will contact him personally.   Liz Beach MSW, [place], [place], [phone]

## 2014-11-20 NOTE — Progress Notes (Signed)
ANTIBIOTIC CONSULT NOTE   Pharmacy Consult for Levaquin Indication: pneumonia  Allergies  Allergen Reactions  . Ace Inhibitors Other (See Comments)    unknown  . Other Itching    Walnuts, honeydew, watermelon, catanlope, bananas  . Epinephrine Anxiety    Hallucinations     Patient Measurements: Height: 5' 2.4" (158.5 cm) Weight: 171 lb 11.2 oz (77.883 kg) IBW/kg (Calculated) : 51.02  Vital Signs: Temp: 98.6 F (37 C) (03/08 0516) Temp Source: Oral (03/08 0516) BP: 140/71 mmHg (03/08 0516)  Labs:  Recent Labs  11/17/14 1735 11/17/14 1850  WBC 8.5  --   HGB 13.2  --   PLT 147*  --   CREATININE  --  0.62   Estimated Creatinine Clearance: 64.8 mL/min (by C-G formula based on Cr of 0.62).  Assessment:   Day # 2 Ceftriaxone and Azithromycin for CAP/LLL pneumonia. To transition to Levaquin PO.  Discussed briefly with Dr. Wynelle Cleveland. For HCAP dosing with MS and RUL nodule.  Goal of Therapy:  appropriate Levaquin dose for renal function and indication  Plan:   Levaquin 750 mg PO daily.    First dose prior to discharge to SNF today.  Arty Baumgartner, [place] Pager: [phone] 11/20/2014,10:04 AM

## 2014-11-20 NOTE — Care Management Note (Signed)
    Page 1 of 1   11/20/2014     4:22:35 PM CARE MANAGEMENT NOTE 11/20/2014  Patient:  Heather Lutz, Heather Lutz   Account Number:  [id number]  Date Initiated:  11/20/2014  Documentation initiated by:  Tomi Bamberger  Subjective/Objective Assessment:   dx flu  admit- lives with spouse,     Action/Plan:   pt eval- snf   Anticipated DC Date:  11/20/2014   Anticipated DC Plan:  SKILLED NURSING FACILITY  In-house referral  Clinical Social Worker      DC Planning Services  CM consult      Choice offered to / List presented to:             Status of service:  Completed, signed off Medicare Important Message given?  YES (If response is "NO", the following Medicare IM given date fields will be blank) Date Medicare IM given:  11/19/2014 Medicare IM given by:  Tomi Bamberger Date Additional Medicare IM given:   Additional Medicare IM given by:    Discharge Disposition:  [place]  Per UR Regulation:  Reviewed for med. necessity/level of care/duration of stay  If discussed at [place] of Stay Meetings, dates discussed:    Comments:  11/20/14 [place], BSN [phone] patient is for dc to snf, CSW following.

## 2014-11-20 NOTE — Progress Notes (Signed)
Pt prepared for d/c to SNF. IV d/c'd. Skin intact except as most recently charted. Vitals are stable. Report called to receiving facility. Pt to be transported by ambulance service. 

## 2014-11-22 ENCOUNTER — Non-Acute Institutional Stay (SKILLED_NURSING_FACILITY): Payer: Medicare Other | Admitting: Internal Medicine

## 2014-11-22 DIAGNOSIS — I878 Other specified disorders of veins: Secondary | ICD-10-CM | POA: Diagnosis not present

## 2014-11-22 DIAGNOSIS — J181 Lobar pneumonia, unspecified organism: Principal | ICD-10-CM

## 2014-11-22 DIAGNOSIS — R911 Solitary pulmonary nodule: Secondary | ICD-10-CM | POA: Diagnosis not present

## 2014-11-22 DIAGNOSIS — J189 Pneumonia, unspecified organism: Secondary | ICD-10-CM

## 2014-11-22 DIAGNOSIS — G35 Multiple sclerosis: Secondary | ICD-10-CM

## 2014-11-22 DIAGNOSIS — IMO0001 Reserved for inherently not codable concepts without codable children: Secondary | ICD-10-CM

## 2014-11-22 NOTE — Progress Notes (Signed)
MRN: [id number] Name: Heather Lutz  Sex: female Age: 69 y.o. DOB: May 29, 1946  [facility] #: [facility] Facility/Room:109 Level Of Care: SNF Provider: Inocencio Homes D Emergency Contacts: Extended Emergency Contact Information Primary Emergency Contact: Tamirra, Sienkiewicz Address: [address]          [place] [address] [place] Phone: [phone] Work Phone: [phone] Mobile Phone: [phone] Relation: None Secondary Emergency Contact: [place] Work Phone: [phone] Mobile Phone: [phone] Relation: Son  Code Status:FULL   Allergies: Ace inhibitors; Other; and Epinephrine  Chief Complaint  Patient presents with  . New Admit To SNF    HPI: Patient is 69 y.o. female who was hospitalized for PNA and is admitted to SNF for generALIZED WEAKNESS FOR ot/pt.  Past Medical History  Diagnosis Date  . Multiple sclerosis   . Osteoporosis   . GERD (gastroesophageal reflux disease)     Past Surgical History  Procedure Laterality Date  . Abdominal hysterectomy    . Esophagogastroduodenoscopy N/A 11/06/2012    Procedure: ESOPHAGOGASTRODUODENOSCOPY (EGD);  Surgeon: Lear Ng, MD;  Location: [facility] ENDOSCOPY;  Service: Endoscopy;  Laterality: N/A;      Medication List       This list is accurate as of: 11/22/14 11:59 PM.  Always use your most recent med list.               acetaminophen 325 MG tablet  Commonly known as:  TYLENOL  Take 650 mg by mouth every 6 (six) hours as needed for pain.     benzonatate 100 MG capsule  Commonly known as:  TESSALON PERLES  Take 1 capsule (100 mg total) by mouth 3 (three) times daily as needed for cough.     brimonidine-timolol 0.2-0.5 % ophthalmic solution  Commonly known as:  COMBIGAN  Place 1 drop into both eyes every 12 (twelve) hours.     COD LIVER OIL PO  Take 1 capsule by mouth daily.     latanoprost 0.005 % ophthalmic solution  Commonly known as:   XALATAN  Place 1 drop into both eyes at bedtime.     levofloxacin 750 MG tablet  Commonly known as:  LEVAQUIN  Take 1 tablet (750 mg total) by mouth daily.     menthol-cetylpyridinium 3 MG lozenge  Commonly known as:  CEPACOL  Take 1 lozenge (3 mg total) by mouth as needed for sore throat.     multivitamin with minerals Tabs tablet  Take 1 tablet by mouth daily.     naproxen sodium 220 MG tablet  Commonly known as:  ANAPROX  Take 220-440 mg by mouth 2 (two) times daily as needed (pain).     potassium chloride 10 MEQ tablet  Commonly known as:  K-DUR  Take 1 tablet (10 mEq total) by mouth daily.     vitamin C 1000 MG tablet  Take 1,500 mg by mouth daily.     Vitamin D3 5000 UNITS Caps  Take 5,000 Units by mouth daily.        No orders of the defined types were placed in this encounter.     There is no immunization history on file for this patient.  History  Substance Use Topics  . Smoking status: Never Smoker   . Smokeless tobacco: Not on file  . Alcohol Use: No    Family history is noncontributory    Review of Systems  DATA OBTAINED: from patient, nurse GENERAL:  no fevers,  fatigue, appetite changes SKIN: No itching, rash or wounds EYES: No eye pain, redness, discharge EARS: No earache, tinnitus, change in hearing NOSE: No congestion, drainage or bleeding  MOUTH/THROAT: No mouth or tooth pain, No sore throat RESPIRATORY: No cough, wheezing, SOB CARDIAC: No chest pain, palpitations, lower extremity edema  GI: No abdominal pain, No N/V/D or constipation, No heartburn or reflux  GU: No dysuria, frequency or urgency, or incontinence  MUSCULOSKELETAL: No unrelieved bone/joint pain NEUROLOGIC: pt c/o her spine was touching the mattress last night and she woke up paralized, she couldn't move, but was finally able to PSYCHIATRIC: No overt anxiety or sadness, No behavior issue.   Filed Vitals:   11/22/14 2033  BP: 130/73  Pulse: 74  Temp: 96.7 F (35.9 C)   Resp: 18    Physical Exam  GENERAL APPEARANCE: Alert, conversant,  No acute distress.  SKIN: No diaphoresis rash HEAD: Normocephalic, atraumatic  EYES: Conjunctiva/lids clear. Pupils round, reactive. EOMs intact.  EARS: External exam WNL, canals clear. Hearing grossly normal.  NOSE: No deformity or discharge.  MOUTH/THROAT: Lips w/o lesions  RESPIRATORY: Breathing is even, unlabored. Lung sounds are clear   CARDIOVASCULAR: Heart RRR no murmurs, rubs or gallops. No peripheral edema.   GASTROINTESTINAL: Abdomen is soft, non-tender, not distended w/ normal bowel sounds. GENITOURINARY: Bladder non tender, not distended  MUSCULOSKELETAL: MS NEUROLOGIC:  Cranial nerves 2-12 grossly intact. PSYCHIATRIC; odd, no behavioral issues  Patient Active Problem List   Diagnosis Date Noted  . Venous stasis 11/20/2014  . Lung nodule < 6cm on CT 11/20/2014  . Left lower lobe pneumonia 11/17/2014  . Influenza A (H1N1) 11/06/2012  . Hematemesis 11/06/2012  . CAP (community acquired pneumonia) 11/05/2012  . Hypoxemia 11/05/2012  . SOB (shortness of breath) 11/05/2012  . Multiple sclerosis 11/05/2012    CBC    Component Value Date/Time   WBC 8.5 11/17/2014 1735   RBC 4.81 11/17/2014 1735   HGB 13.2 11/17/2014 1735   HCT 40.8 11/17/2014 1735   PLT 147* 11/17/2014 1735   MCV 84.8 11/17/2014 1735   LYMPHSABS 0.5* 11/17/2014 1735   MONOABS 0.8 11/17/2014 1735   EOSABS 0.1 11/17/2014 1735   BASOSABS 0.0 11/17/2014 1735    CMP     Component Value Date/Time   NA 137 11/17/2014 1850   K 3.9 11/17/2014 1850   CL 106 11/17/2014 1850   CO2 26 11/17/2014 1850   GLUCOSE 101* 11/17/2014 1850   BUN 13 11/17/2014 1850   CREATININE 0.62 11/17/2014 1850   CALCIUM 8.6 11/17/2014 1850   PROT 8.0 12/09/2013 2055   ALBUMIN 3.7 12/09/2013 2055   AST 26 12/09/2013 2055   ALT 17 12/09/2013 2055   ALKPHOS 115 12/09/2013 2055   BILITOT <0.2* 12/09/2013 2055   GFRNONAA 90* 11/17/2014 1850   GFRAA  >90 11/17/2014 1850    Assessment and Plan  Left lower lobe pneumonia L lung pneumonia; started on IV Rocephin and Zithromax- transition to Levaquin on d/c today to complete a 7 day course- influenza negative- Tamiflu stopped 3/6, blood cultures neg;- weaned off of O2- 96% on RA   Multiple sclerosis with difficulty in ambulation. PT eval - recommending SNF- patient and husband in agreement    Venous stasis Chronic leg edema-likely dependant edema with inability to ambulate; patient is not on diuretics at this time; -will cont supportive care, leg elevation - edema has resolved completely with this- patient advised to do this at homehronic leg edema,    Lung  nodule < 6cm on CT If the patient is at igh risk for bronchogenic carcinoma, follow-up chest CT at 6-12 months is recommended; pt is a non smoker so rec f/u 12 months     Hennie Duos, MD

## 2014-11-24 ENCOUNTER — Encounter: Payer: Self-pay | Admitting: Internal Medicine

## 2014-11-24 LAB — CULTURE, BLOOD (SINGLE): CULTURE: NO GROWTH

## 2014-11-24 NOTE — Assessment & Plan Note (Signed)
L lung pneumonia; started on IV Rocephin and Zithromax- transition to Levaquin on d/c today to complete a 7 day course- influenza negative- Tamiflu stopped 3/6, blood cultures neg;- weaned off of O2- 96% on RA

## 2014-11-24 NOTE — Assessment & Plan Note (Signed)
Chronic leg edema-likely dependant edema with inability to ambulate; patient is not on diuretics at this time; -will cont supportive care, leg elevation - edema has resolved completely with this- patient advised to do this at homehronic leg edema,

## 2014-11-24 NOTE — Assessment & Plan Note (Addendum)
If the patient is at igh risk for bronchogenic carcinoma, follow-up chest CT at 6-12 months is recommended; pt is a non smoker so rec f/u 12 months

## 2014-11-24 NOTE — Assessment & Plan Note (Signed)
with difficulty in ambulation. PT eval - recommending SNF- patient and husband in agreement

## 2014-12-04 ENCOUNTER — Non-Acute Institutional Stay (SKILLED_NURSING_FACILITY): Payer: Medicare Other | Admitting: Internal Medicine

## 2014-12-04 ENCOUNTER — Encounter: Payer: Self-pay | Admitting: Internal Medicine

## 2014-12-04 DIAGNOSIS — I878 Other specified disorders of veins: Secondary | ICD-10-CM

## 2014-12-04 DIAGNOSIS — R600 Localized edema: Secondary | ICD-10-CM

## 2014-12-04 NOTE — Progress Notes (Signed)
MRN: [id number] Name: Heather Lutz  Sex: female Age: 69 y.o. DOB: 07-08-1946  [place] #: [facility] Facility/Room:109 Level Of Care: SNF Provider: Inocencio Homes D Emergency Contacts: Extended Emergency Contact Information Primary Emergency Contact: Marquia, Costello Address: [address]          [place] [address] [place] Phone: [phone] Work Phone: [phone] Mobile Phone: [phone] Relation: None Secondary Emergency Contact: [place] Work Phone: [phone] Mobile Phone: [phone] Relation: Son  Code Status: FULL  Allergies: Ace inhibitors; Other; and Epinephrine  Chief Complaint  Patient presents with  . Acute Visit    HPI: Patient is 69 y.o. female who nursing asked me to see for onset pedal edema today.  Past Medical History  Diagnosis Date  . Multiple sclerosis   . Osteoporosis   . GERD (gastroesophageal reflux disease)     Past Surgical History  Procedure Laterality Date  . Abdominal hysterectomy    . Esophagogastroduodenoscopy N/A 11/06/2012    Procedure: ESOPHAGOGASTRODUODENOSCOPY (EGD);  Surgeon: Lear Ng, MD;  Location[facility] ENDOSCOPY;  Service: Endoscopy;  Laterality: N/A;      Medication List       This list is accurate as of: 12/04/14 11:59 PM.  Always use your most recent med list.               acetaminophen 325 MG tablet  Commonly known as:  TYLENOL  Take 650 mg by mouth every 6 (six) hours as needed for pain.     benzonatate 100 MG capsule  Commonly known as:  TESSALON PERLES  Take 1 capsule (100 mg total) by mouth 3 (three) times daily as needed for cough.     brimonidine-timolol 0.2-0.5 % ophthalmic solution  Commonly known as:  COMBIGAN  Place 1 drop into both eyes every 12 (twelve) hours.     COD LIVER OIL PO  Take 1 capsule by mouth daily.     latanoprost 0.005 % ophthalmic solution  Commonly known as:  XALATAN  Place 1 drop into both eyes at  bedtime.     levofloxacin 750 MG tablet  Commonly known as:  LEVAQUIN  Take 1 tablet (750 mg total) by mouth daily.     menthol-cetylpyridinium 3 MG lozenge  Commonly known as:  CEPACOL  Take 1 lozenge (3 mg total) by mouth as needed for sore throat.     multivitamin with minerals Tabs tablet  Take 1 tablet by mouth daily.     naproxen sodium 220 MG tablet  Commonly known as:  ANAPROX  Take 220-440 mg by mouth 2 (two) times daily as needed (pain).     potassium chloride 10 MEQ tablet  Commonly known as:  K-DUR  Take 1 tablet (10 mEq total) by mouth daily.     vitamin C 1000 MG tablet  Take 1,500 mg by mouth daily.     Vitamin D3 5000 UNITS Caps  Take 5,000 Units by mouth daily.        No orders of the defined types were placed in this encounter.     There is no immunization history on file for this patient.  History  Substance Use Topics  . Smoking status: Never Smoker   . Smokeless tobacco: Not on file  . Alcohol Use: No    Review of Systems  DATA OBTAINED: from patient, nurse GENERAL:  no fevers, fatigue, appetite changes SKIN: No itching, rash HEENT: No complaint RESPIRATORY: No cough,  wheezing, SOB CARDIAC: No chest pain, palpitations,+ lower extremity edema ;pt does not like to put her legs up.When she does they get weak and she can't walk; I told her to put them up every day immediately after tx. GI: No abdominal pain, No N/V/D or constipation, No heartburn or reflux  GU: No dysuria, frequency or urgency, or incontinence  MUSCULOSKELETAL: No unrelieved bone/joint pain NEUROLOGIC: No headache, dizziness  PSYCHIATRIC: No overt anxiety or sadness  Filed Vitals:   12/04/14 1425  BP: 112/65  Pulse: 70  Temp: 97.1 F (36.2 C)  Resp: 18    Physical Exam  GENERAL APPEARANCE: Alert, conversant, No acute distress  SKIN: No diaphoresis rash, or wounds HEENT: Unremarkable RESPIRATORY: Breathing is even, unlabored. Lung sounds are clear    CARDIOVASCULAR: Heart RRR no murmurs, rubs or gallops. 2+ peripheral edema to mid shin GASTROINTESTINAL: Abdomen is soft, non-tender, not distended w/ normal bowel sounds.  GENITOURINARY: Bladder non tender, not distended  MUSCULOSKELETAL: No abnormal joints or musculature NEUROLOGIC: Cranial nerves 2-12 grossly intact PSYCHIATRIC: Mood and affect appropriate to situation, no behavioral issues  Patient Active Problem List   Diagnosis Date Noted  . Venous stasis 11/20/2014  . Lung nodule < 6cm on CT 11/20/2014  . Left lower lobe pneumonia 11/17/2014  . Influenza A (H1N1) 11/06/2012  . Hematemesis 11/06/2012  . CAP (community acquired pneumonia) 11/05/2012  . Hypoxemia 11/05/2012  . SOB (shortness of breath) 11/05/2012  . Multiple sclerosis 11/05/2012    CBC    Component Value Date/Time   WBC 8.5 11/17/2014 1735   RBC 4.81 11/17/2014 1735   HGB 13.2 11/17/2014 1735   HCT 40.8 11/17/2014 1735   PLT 147* 11/17/2014 1735   MCV 84.8 11/17/2014 1735   LYMPHSABS 0.5* 11/17/2014 1735   MONOABS 0.8 11/17/2014 1735   EOSABS 0.1 11/17/2014 1735   BASOSABS 0.0 11/17/2014 1735    CMP     Component Value Date/Time   NA 137 11/17/2014 1850   K 3.9 11/17/2014 1850   CL 106 11/17/2014 1850   CO2 26 11/17/2014 1850   GLUCOSE 101* 11/17/2014 1850   BUN 13 11/17/2014 1850   CREATININE 0.62 11/17/2014 1850   CALCIUM 8.6 11/17/2014 1850   PROT 8.0 12/09/2013 2055   ALBUMIN 3.7 12/09/2013 2055   AST 26 12/09/2013 2055   ALT 17 12/09/2013 2055   ALKPHOS 115 12/09/2013 2055   BILITOT <0.2* 12/09/2013 2055   GFRNONAA 90* 11/17/2014 1850   GFRAA >90 11/17/2014 1850    Assessment and Plan PEDAL EDEMA/ Venous stasis Pt has now developed 2+ pitting edema;she is not going to keep her legs up, when they have been up she is convinced the strength goes out of them and she is too weak to walk. I asked her to try elevation immediately after tx for several hrs. Dietary says she has not gained  weight. Start TED hose and lasix 20 mg daily on Monday, Wed, Friday     Hennie Duos, MD

## 2014-12-04 NOTE — Assessment & Plan Note (Addendum)
Pt has now developed 2+ pitting edema;she is not going to keep her legs up, when they have been up she is convinced the strength goes out of them and she is too weak to walk. I asked her to try elevation immediately after tx for several hrs. Dietary says she has not gained weight. Lungs are clear. Start TED hose and lasix 20 mg daily on Monday, Wed, Friday

## 2014-12-11 ENCOUNTER — Non-Acute Institutional Stay (SKILLED_NURSING_FACILITY): Payer: Medicare Other | Admitting: Internal Medicine

## 2014-12-11 DIAGNOSIS — IMO0001 Reserved for inherently not codable concepts without codable children: Secondary | ICD-10-CM

## 2014-12-11 DIAGNOSIS — G35D Multiple sclerosis, unspecified: Secondary | ICD-10-CM

## 2014-12-11 DIAGNOSIS — G35 Multiple sclerosis: Secondary | ICD-10-CM | POA: Diagnosis not present

## 2014-12-11 DIAGNOSIS — R911 Solitary pulmonary nodule: Secondary | ICD-10-CM

## 2014-12-11 DIAGNOSIS — J189 Pneumonia, unspecified organism: Secondary | ICD-10-CM | POA: Diagnosis not present

## 2014-12-11 DIAGNOSIS — I878 Other specified disorders of veins: Secondary | ICD-10-CM

## 2014-12-11 NOTE — Progress Notes (Signed)
MRN: [id number] Name: Heather Lutz  Sex: female Age: 69 y.o. DOB: 12/17/1945  [place] #: [facility] Facility/Room:109 Level Of Care: SNF Provider: Inocencio Homes D Emergency Contacts: Extended Emergency Contact Information Primary Emergency Contact: Ritu, Gagliardo Address: [address]          [place] [address] [place] Phone: [phone] Work Phone: [phone] Mobile Phone: [phone] Relation: None Secondary Emergency Contact: [place] Work Phone: [phone] Mobile Phone: [phone] Relation: Son  Code Status:FULL   Allergies: Ace inhibitors; Other; and Epinephrine  Chief Complaint  Patient presents with  . Discharge Note    HPI: Patient is 69 y.o. female who was admitted to SNF for generalized weakness after  PNA who is now ready to go home.  Past Medical History  Diagnosis Date  . Multiple sclerosis   . Osteoporosis   . GERD (gastroesophageal reflux disease)     Past Surgical History  Procedure Laterality Date  . Abdominal hysterectomy    . Esophagogastroduodenoscopy N/A 11/06/2012    Procedure: ESOPHAGOGASTRODUODENOSCOPY (EGD);  Surgeon: Lear Ng, MD;  Location: [facility] ENDOSCOPY;  Service: Endoscopy;  Laterality: N/A;      Medication List       This list is accurate as of: 12/11/14 11:59 PM.  Always use your most recent med list.               acetaminophen 325 MG tablet  Commonly known as:  TYLENOL  Take 650 mg by mouth every 6 (six) hours as needed for pain.     benzonatate 100 MG capsule  Commonly known as:  TESSALON PERLES  Take 1 capsule (100 mg total) by mouth 3 (three) times daily as needed for cough.     brimonidine-timolol 0.2-0.5 % ophthalmic solution  Commonly known as:  COMBIGAN  Place 1 drop into both eyes every 12 (twelve) hours.     COD LIVER OIL PO  Take 1 capsule by mouth daily.     furosemide 20 MG tablet  Commonly known as:  LASIX  Take 20 mg by  mouth 3 (three) times a week. Mon, Wed, Fri     latanoprost 0.005 % ophthalmic solution  Commonly known as:  XALATAN  Place 1 drop into both eyes at bedtime.     menthol-cetylpyridinium 3 MG lozenge  Commonly known as:  CEPACOL  Take 1 lozenge (3 mg total) by mouth as needed for sore throat.     multivitamin with minerals Tabs tablet  Take 1 tablet by mouth daily.     naproxen sodium 220 MG tablet  Commonly known as:  ANAPROX  Take 220-440 mg by mouth 2 (two) times daily as needed (pain).     potassium chloride 10 MEQ tablet  Commonly known as:  K-DUR  Take 1 tablet (10 mEq total) by mouth daily.     vitamin C 1000 MG tablet  Take 1,500 mg by mouth daily.     Vitamin D3 5000 UNITS Caps  Take 5,000 Units by mouth daily.        Meds ordered this encounter  Medications  . furosemide (LASIX) 20 MG tablet    Sig: Take 20 mg by mouth 3 (three) times a week. Mon, Wed, Fri     There is no immunization history on file for this patient.  History  Substance Use Topics  . Smoking status: Never Smoker   . Smokeless tobacco: Not on file  . Alcohol Use:  No    Filed Vitals:   12/11/14 2035  BP: 120/73  Pulse: 85  Temp: 97.5 F (36.4 C)  Resp: 18    Physical Exam  GENERAL APPEARANCE: Alert, conversant. No acute distress.  HEENT: Unremarkable. RESPIRATORY: Breathing is even, unlabored. Lung sounds are clear   CARDIOVASCULAR: Heart RRR no murmurs, rubs or gallops. No peripheral edema.  GASTROINTESTINAL: Abdomen is soft, non-tender, not distended w/ normal bowel sounds.  NEUROLOGIC: Cranial nerves 2-12 grossly intact  Patient Active Problem List   Diagnosis Date Noted  . Venous stasis 11/20/2014  . Lung nodule < 6cm on CT 11/20/2014  . Left lower lobe pneumonia 11/17/2014  . Influenza A (H1N1) 11/06/2012  . Hematemesis 11/06/2012  . CAP (community acquired pneumonia) 11/05/2012  . Hypoxemia 11/05/2012  . SOB (shortness of breath) 11/05/2012  . Multiple sclerosis  11/05/2012    CBC    Component Value Date/Time   WBC 8.5 11/17/2014 1735   RBC 4.81 11/17/2014 1735   HGB 13.2 11/17/2014 1735   HCT 40.8 11/17/2014 1735   PLT 147* 11/17/2014 1735   MCV 84.8 11/17/2014 1735   LYMPHSABS 0.5* 11/17/2014 1735   MONOABS 0.8 11/17/2014 1735   EOSABS 0.1 11/17/2014 1735   BASOSABS 0.0 11/17/2014 1735    CMP     Component Value Date/Time   NA 137 11/17/2014 1850   K 3.9 11/17/2014 1850   CL 106 11/17/2014 1850   CO2 26 11/17/2014 1850   GLUCOSE 101* 11/17/2014 1850   BUN 13 11/17/2014 1850   CREATININE 0.62 11/17/2014 1850   CALCIUM 8.6 11/17/2014 1850   PROT 8.0 12/09/2013 2055   ALBUMIN 3.7 12/09/2013 2055   AST 26 12/09/2013 2055   ALT 17 12/09/2013 2055   ALKPHOS 115 12/09/2013 2055   BILITOT <0.2* 12/09/2013 2055   GFRNONAA 90* 11/17/2014 1850   GFRAA >90 11/17/2014 1850    Assessment and Plan  Pt is stable to d/c to home with HH/OT/PT/CNA.  Hennie Duos, MD

## 2014-12-12 ENCOUNTER — Encounter: Payer: Self-pay | Admitting: Internal Medicine

## 2015-11-27 ENCOUNTER — Encounter: Payer: Self-pay | Admitting: Podiatry

## 2015-11-27 ENCOUNTER — Ambulatory Visit (HOSPITAL_BASED_OUTPATIENT_CLINIC_OR_DEPARTMENT_OTHER)
Admission: RE | Admit: 2015-11-27 | Discharge: 2015-11-27 | Disposition: A | Discharge status: Home / Self Care | Payer: Medicare Other | Source: Ambulatory Visit | Attending: Podiatry | Admitting: Podiatry

## 2015-11-27 ENCOUNTER — Ambulatory Visit (INDEPENDENT_AMBULATORY_CARE_PROVIDER_SITE_OTHER): Payer: Medicare Other | Admitting: Podiatry

## 2015-11-27 VITALS — BP 139/83 | HR 78 | Resp 18

## 2015-11-27 DIAGNOSIS — M19079 Primary osteoarthritis, unspecified ankle and foot: Secondary | ICD-10-CM

## 2015-11-27 DIAGNOSIS — M7989 Other specified soft tissue disorders: Secondary | ICD-10-CM | POA: Diagnosis not present

## 2015-11-27 DIAGNOSIS — M79672 Pain in left foot: Secondary | ICD-10-CM | POA: Diagnosis not present

## 2015-11-27 DIAGNOSIS — R609 Edema, unspecified: Secondary | ICD-10-CM

## 2015-11-27 DIAGNOSIS — M8580 Other specified disorders of bone density and structure, unspecified site: Secondary | ICD-10-CM | POA: Diagnosis not present

## 2015-11-27 DIAGNOSIS — B351 Tinea unguium: Secondary | ICD-10-CM

## 2015-11-27 DIAGNOSIS — R52 Pain, unspecified: Secondary | ICD-10-CM

## 2015-11-27 DIAGNOSIS — M79676 Pain in unspecified toe(s): Secondary | ICD-10-CM | POA: Diagnosis not present

## 2015-11-27 NOTE — Progress Notes (Signed)
   Subjective:    Patient ID: Heather Lutz, female    DOB: 1945/12/28, 70 y.o.   MRN: [id number]  HPI  70 year old female presents the also concerns of swelling to both of her legs and feet which is been ongoing for about 2 years. She states that she presented had a ultrasound performed when she did not have blood clot this is done several years ago. She is tried fluid pills the any relief. Compression socks are very difficult for her to get on. She also states that her left ankle looks "deformed". No recent injury or trauma. No redness or warmth female. Also states that both of her big toenails and fifth digit toenails are thick and discolored and they crack at times. There also tender to palpation was shoes mostly. No other complaints. Review of Systems  All other systems reviewed and are negative.      Objective:   Physical Exam General: AAO x3, NAD  Dermatological: Bilateral hallux and fifth digit toenails appear to be hypertrophic, dystrophic, brittle, discolored, elongated and there is tenderness along the nails 4. There is no swelling erythema or drainage. There is no open lesions identified at this time.  Vascular: DP/PT pulses 1+4, CRT less than 3 seconds. There is moderate edema to bilateral feet, ankles, legs. There is no varicose veins present. With calf compression.  Neruologic: Grossly intact via light touch bilateral. Vibratory intact via tuning fork bilateral. Protective threshold with Semmes Wienstein monofilament intact to all pedal sites bilateral. Patellar and Achilles deep tendon reflexes 2+ bilateral. No Babinski or clonus noted bilateral.   Musculoskeletathere is mild diffuse tenderness along the dorsal aspect of the feet bilaterally. There is no pain of the left ankle although she subjectively states that looks different. This is likely due to swelling. There is no pain or restriction ankle, subtalar range of motion.Gait: Unassisted, Nonantalgic.      Assessment  & Plan:  70 year old female with bilateral leg swelling, symptomatic onychomycosis, foot pain  -Treatment options discussed including all alternatives, risks, and complications -Etiology of symptoms were discussed -Ordered x-rays of bilateral feet left ankle  -Will order arterial and venous studies included venous reflux study.  -Discussed compression wraps instead of stockings however we will await the results of the studies.  -Nails debrided 4 without complications or bleeding.  -Follow-up after vascular studies.   Celesta Gentile, DPM

## 2015-11-29 ENCOUNTER — Telehealth: Payer: Self-pay | Admitting: *Deleted

## 2015-11-29 DIAGNOSIS — R0989 Other specified symptoms and signs involving the circulatory and respiratory systems: Secondary | ICD-10-CM

## 2015-11-29 NOTE — Telephone Encounter (Signed)
-----   Message from Trula Slade, DPM sent at 11/28/2015  7:20 AM EDT ----- Can you please order arterial studies for decreased pulses and venous reflux study for chronic swelling bilateral legs. Thanks.

## 2015-12-05 ENCOUNTER — Other Ambulatory Visit: Payer: Self-pay | Admitting: Podiatry

## 2015-12-05 DIAGNOSIS — R609 Edema, unspecified: Secondary | ICD-10-CM

## 2015-12-09 ENCOUNTER — Ambulatory Visit (HOSPITAL_COMMUNITY)
Admission: RE | Admit: 2015-12-09 | Discharge: 2015-12-09 | Disposition: A | Discharge status: Home / Self Care | Payer: Medicare Other | Source: Ambulatory Visit | Attending: Podiatry | Admitting: Podiatry

## 2015-12-09 DIAGNOSIS — I8393 Asymptomatic varicose veins of bilateral lower extremities: Secondary | ICD-10-CM | POA: Diagnosis not present

## 2015-12-09 DIAGNOSIS — R609 Edema, unspecified: Secondary | ICD-10-CM | POA: Diagnosis not present

## 2015-12-09 DIAGNOSIS — K219 Gastro-esophageal reflux disease without esophagitis: Secondary | ICD-10-CM | POA: Diagnosis not present

## 2015-12-10 NOTE — Telephone Encounter (Addendum)
-----   Message from Trula Slade, DPM sent at 12/09/2015  5:08 PM EDT ----- Please let her know that her x-rays were normal. There was some softening of the bone but normal for her age and activity level. Can see swelling. Follow-up after vascular studies. Thanks. 12/10/2015-Informed pt of Dr. Leigh Aurora review of her x-rays.

## 2015-12-11 ENCOUNTER — Other Ambulatory Visit: Payer: Self-pay | Admitting: Podiatry

## 2015-12-11 ENCOUNTER — Ambulatory Visit (HOSPITAL_COMMUNITY)
Admission: RE | Admit: 2015-12-11 | Discharge: 2015-12-11 | Disposition: A | Discharge status: Home / Self Care | Payer: Medicare Other | Source: Ambulatory Visit | Attending: Urology | Admitting: Urology

## 2015-12-11 DIAGNOSIS — G35 Multiple sclerosis: Secondary | ICD-10-CM | POA: Insufficient documentation

## 2015-12-11 DIAGNOSIS — I739 Peripheral vascular disease, unspecified: Secondary | ICD-10-CM

## 2015-12-11 DIAGNOSIS — R0989 Other specified symptoms and signs involving the circulatory and respiratory systems: Secondary | ICD-10-CM

## 2015-12-11 DIAGNOSIS — K219 Gastro-esophageal reflux disease without esophagitis: Secondary | ICD-10-CM | POA: Insufficient documentation

## 2015-12-12 NOTE — Telephone Encounter (Addendum)
-----   Message from Trula Slade, DPM sent at 12/11/2015  4:40 PM EDT ----- Can you put in a referral for vein specialist. Her venous reflux study shows- There is significant superficial venous incompetence in the GSV below the knee.  Matt 12/12/2015-Faxed referral, pt demographics and clinicals to[facility].  Informed pt of the referral, gave [facility] location and appt line.

## 2015-12-13 ENCOUNTER — Telehealth: Payer: Self-pay | Admitting: *Deleted

## 2015-12-13 DIAGNOSIS — R0989 Other specified symptoms and signs involving the circulatory and respiratory systems: Secondary | ICD-10-CM

## 2015-12-13 NOTE — Telephone Encounter (Addendum)
-----   Message from Trula Slade, DPM sent at 12/12/2015  5:57 PM EDT ----- I think I sent you a message this morning about vascular consult for venous insuffiencey. I got her arterial studies back and there is possible blockage of the right Anterior tibial artery. For both of these issues she should be referred to vascular. Thanks. 12/13/2015-referral for arterial consultations faxed to [facility].

## 2016-01-01 ENCOUNTER — Encounter: Payer: Self-pay | Admitting: Vascular Surgery

## 2016-01-07 ENCOUNTER — Encounter: Payer: Medicare Other | Admitting: Vascular Surgery

## 2016-01-16 ENCOUNTER — Encounter: Payer: Self-pay | Admitting: Vascular Surgery

## 2016-01-21 ENCOUNTER — Encounter: Payer: Medicare Other | Admitting: Vascular Surgery

## 2016-01-28 ENCOUNTER — Encounter: Payer: Self-pay | Admitting: Surgery

## 2016-02-03 ENCOUNTER — Ambulatory Visit (INDEPENDENT_AMBULATORY_CARE_PROVIDER_SITE_OTHER): Payer: Medicare Other | Admitting: Surgery

## 2016-02-03 ENCOUNTER — Encounter: Payer: Self-pay | Admitting: Surgery

## 2016-02-03 VITALS — BP 139/78 | HR 71 | Ht 62.4 in | Wt 173.0 lb

## 2016-02-03 DIAGNOSIS — I872 Venous insufficiency (chronic) (peripheral): Secondary | ICD-10-CM

## 2016-02-03 DIAGNOSIS — I890 Lymphedema, not elsewhere classified: Secondary | ICD-10-CM

## 2016-02-03 NOTE — Progress Notes (Signed)
Reason for referral: Chronic lower extremitiy edema  History of Present Illness  Heather Lutz is a 70 y.o. female who presents with chronic lower extremity edema.  She reports no history of injury, no ulcers and no history of DVT.  She was diagnosed with MS 3-4 years ago and she lives a sedentary life.  Sh does not ambulate and sits in a recliner as well as sleepsi in the recliner.  She has had lasix therapy, compression knee high stockings and leg compression therapy while in the hospital.  Her legs do appear to be less swollen in the morning than at night.   Past medical history includes: MS.  She denise hypertension, DM and hyperlipidemia.    Past Medical History  Diagnosis Date  . Multiple sclerosis ([place])   . Osteoporosis   . GERD (gastroesophageal reflux disease)     Past Surgical History  Procedure Laterality Date  . Abdominal hysterectomy    . Esophagogastroduodenoscopy N/A 11/06/2012    Procedure: ESOPHAGOGASTRODUODENOSCOPY (EGD);  Surgeon: Lear Ng, MD;  Location: [facility] ENDOSCOPY;  Service: Endoscopy;  Laterality: N/A;    Social History   Social History  . Marital Status: Married    Spouse Name: N/A  . Number of Children: N/A  . Years of Education: N/A   Occupational History  . Not on file.   Social History Main Topics  . Smoking status: Never Smoker   . Smokeless tobacco: Not on file  . Alcohol Use: No  . Drug Use: No  . Sexual Activity: Not on file   Other Topics Concern  . Not on file   Social History Narrative    History reviewed. No pertinent family history.  Current Outpatient Prescriptions on File Prior to Visit  Medication Sig Dispense Refill  . Ascorbic Acid (VITAMIN C) 1000 MG tablet Take 1,500 mg by mouth daily.    . brimonidine-timolol (COMBIGAN) 0.2-0.5 % ophthalmic solution Place 1 drop into both eyes every 12 (twelve) hours.    . Cholecalciferol (VITAMIN D3) 5000 UNITS CAPS Take 5,000 Units by mouth daily.    . COD  LIVER OIL PO Take 1 capsule by mouth daily.    . Multiple Vitamin (MULTIVITAMIN WITH MINERALS) TABS tablet Take 1 tablet by mouth daily.    . naproxen sodium (ANAPROX) 220 MG tablet Take 220-440 mg by mouth 2 (two) times daily as needed (pain).    . potassium chloride (K-DUR) 10 MEQ tablet Take 1 tablet (10 mEq total) by mouth daily. 30 tablet 0  . acetaminophen (TYLENOL) 325 MG tablet Take 650 mg by mouth every 6 (six) hours as needed for pain. Reported on 02/03/2016    . benzonatate (TESSALON PERLES) 100 MG capsule Take 1 capsule (100 mg total) by mouth 3 (three) times daily as needed for cough. (Patient not taking: Reported on 02/03/2016) 20 capsule 0  . furosemide (LASIX) 20 MG tablet Take 20 mg by mouth 3 (three) times a week. Reported on 02/03/2016    . latanoprost (XALATAN) 0.005 % ophthalmic solution Place 1 drop into both eyes at bedtime. Reported on 02/03/2016    . menthol-cetylpyridinium (CEPACOL) 3 MG lozenge Take 1 lozenge (3 mg total) by mouth as needed for sore throat. (Patient not taking: Reported on 02/03/2016) 100 tablet 12   No current facility-administered medications on file prior to visit.    Allergies as of 02/03/2016 - Review Complete 02/03/2016  Allergen Reaction Noted  . Ace inhibitors Other (See Comments)  11/05/2012  . Other Itching 07/03/2011  . Epinephrine Anxiety 07/03/2011     ROS:   General:  No weight loss, Fever, chills  HEENT: No recent headaches, no nasal bleeding, no visual changes, no sore throat  Neurologic: No dizziness, blackouts, seizures. No recent symptoms of stroke or mini- stroke. No recent episodes of slurred speech, or temporary blindness.  Cardiac: No recent episodes of chest pain/pressure, no shortness of breath at rest.  No shortness of breath with exertion.  Denies history of atrial fibrillation or irregular heartbeat  Vascular: No history of rest pain in feet.  No history of claudication.  No history of non-healing ulcer, No history of  DVT   Pulmonary: No home oxygen, no productive cough, no hemoptysis,  No asthma or wheezing  Musculoskeletal:  [ ]  Arthritis, [ ]  Low back pain,  [ ]  Joint pain  Hematologic:No history of hypercoagulable state.  No history of easy bleeding.  No history of anemia  Gastrointestinal: No hematochezia or melena,  No gastroesophageal reflux, no trouble swallowing  Urinary: [ ]  chronic Kidney disease, [ ]  on HD - [ ]  MWF or [ ]  TTHS, [ ]  Burning with urination, [ ]  Frequent urination, [ ]  Difficulty urinating;   Skin: No rashes, chronic edema LE  Psychological: No history of anxiety,  No history of depression  Physical Examination  Filed Vitals:   02/03/16 1337  BP: 139/78  Pulse: 71  Height: 5' 2.4" (1.585 m)  Weight: 173 lb (78.472 kg)  SpO2: 100%    Body mass index is 31.24 kg/(m^2).  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 3.) Referral to the Lymphedema clinic 4.) follow up in 3 months with our vein clinic to discuss further therapy and possible laser ablation.   Theda Sers EMMA [facility] PA-C [facility] Office: [phone]  The patient was seen today in clinic with Dr. Trula Slade  I agree with the  above.  I have seen and evaluated the patient.  She has a 2-3 year history of bilateral leg swelling.  She suffers from [place], and has not been very ambulatory.  She feels that the swelling keeps her from trying to walk.  It is very uncomfortable for her.  She has tried compression but has difficulty putting the stockings on and cannot take them off nobody is with her.  She had a venous reflux evaluation at [facility].  This shows reflux in bilateral great saphenous veins.  Diameter measurements on the right or in the 0.61 range and 0.68 on the left.  I have discussed several treatment strategies with the patient and her husband.  Having traversed her to keep her legs elevated when possible.  I have recommended 20-30 thigh-high compression stockings to help with the edema.  I'm  also sending her to the lymphedema clinic to see if she may be a candidate for a lymphedema pump.  I will also have her follow-up in 3 months to discuss the possibility of endovenous laser ablation of her saphenous vein.  I think her reflux study needs to be repeated.  I have shown it to Dr. Kellie Simmering.  Annamarie Major

## 2016-02-07 NOTE — Addendum Note (Signed)
Addended by: Thresa Ross C on: 02/07/2016 12:50 PM   Modules accepted: Orders

## 2016-02-27 ENCOUNTER — Telehealth: Payer: Self-pay | Admitting: Vascular Surgery

## 2016-02-27 NOTE — Telephone Encounter (Signed)
Patient has been scheduled for Lymphedema therapy at [facility] on 02/24/16 at 12:30 pm

## 2016-05-08 ENCOUNTER — Encounter: Payer: Self-pay | Admitting: Vascular Surgery

## 2016-05-12 ENCOUNTER — Ambulatory Visit: Payer: Medicare Other | Admitting: Vascular Surgery

## 2016-05-12 ENCOUNTER — Ambulatory Visit (HOSPITAL_COMMUNITY): Payer: Medicare Other

## 2016-08-21 DIAGNOSIS — R261 Paralytic gait: Secondary | ICD-10-CM | POA: Insufficient documentation

## 2016-08-21 DIAGNOSIS — M810 Age-related osteoporosis without current pathological fracture: Secondary | ICD-10-CM | POA: Insufficient documentation

## 2016-08-21 DIAGNOSIS — E559 Vitamin D deficiency, unspecified: Secondary | ICD-10-CM | POA: Insufficient documentation

## 2016-08-21 DIAGNOSIS — K219 Gastro-esophageal reflux disease without esophagitis: Secondary | ICD-10-CM | POA: Insufficient documentation

## 2016-09-14 DIAGNOSIS — H548 Legal blindness, as defined in USA: Secondary | ICD-10-CM

## 2016-09-14 HISTORY — DX: Legal blindness, as defined in USA: H54.8

## 2017-06-21 DIAGNOSIS — H2512 Age-related nuclear cataract, left eye: Secondary | ICD-10-CM | POA: Insufficient documentation

## 2017-06-21 DIAGNOSIS — H5703 Miosis: Secondary | ICD-10-CM | POA: Insufficient documentation

## 2017-06-21 DIAGNOSIS — H401123 Primary open-angle glaucoma, left eye, severe stage: Secondary | ICD-10-CM | POA: Insufficient documentation

## 2017-06-21 DIAGNOSIS — H25012 Cortical age-related cataract, left eye: Secondary | ICD-10-CM | POA: Insufficient documentation

## 2017-09-21 ENCOUNTER — Other Ambulatory Visit: Payer: Self-pay

## 2017-09-21 ENCOUNTER — Emergency Department (HOSPITAL_BASED_OUTPATIENT_CLINIC_OR_DEPARTMENT_OTHER): Payer: Medicare Other

## 2017-09-21 ENCOUNTER — Encounter (HOSPITAL_BASED_OUTPATIENT_CLINIC_OR_DEPARTMENT_OTHER): Payer: Self-pay | Admitting: *Deleted

## 2017-09-21 ENCOUNTER — Emergency Department (HOSPITAL_BASED_OUTPATIENT_CLINIC_OR_DEPARTMENT_OTHER)
Admission: EM | Admit: 2017-09-21 | Discharge: 2017-09-22 | Disposition: A | Discharge status: Home / Self Care | Payer: Medicare Other | Attending: Emergency Medicine | Admitting: Emergency Medicine

## 2017-09-21 DIAGNOSIS — R1031 Right lower quadrant pain: Secondary | ICD-10-CM | POA: Diagnosis present

## 2017-09-21 DIAGNOSIS — Z8679 Personal history of other diseases of the circulatory system: Secondary | ICD-10-CM | POA: Insufficient documentation

## 2017-09-21 DIAGNOSIS — K5900 Constipation, unspecified: Secondary | ICD-10-CM | POA: Diagnosis not present

## 2017-09-21 DIAGNOSIS — M79651 Pain in right thigh: Secondary | ICD-10-CM | POA: Insufficient documentation

## 2017-09-21 DIAGNOSIS — N390 Urinary tract infection, site not specified: Secondary | ICD-10-CM

## 2017-09-21 DIAGNOSIS — R109 Unspecified abdominal pain: Secondary | ICD-10-CM

## 2017-09-21 LAB — URINALYSIS, ROUTINE W REFLEX MICROSCOPIC
BILIRUBIN URINE: NEGATIVE
GLUCOSE, UA: NEGATIVE mg/dL
KETONES UR: NEGATIVE mg/dL
Nitrite: POSITIVE — AB
PH: 6.5 (ref 5.0–8.0)
Protein, ur: NEGATIVE mg/dL
SPECIFIC GRAVITY, URINE: 1.02 (ref 1.005–1.030)

## 2017-09-21 LAB — CBC WITH DIFFERENTIAL/PLATELET
BASOS ABS: 0 10*3/uL (ref 0.0–0.1)
BASOS PCT: 0 %
EOS ABS: 0.1 10*3/uL (ref 0.0–0.7)
Eosinophils Relative: 2 %
HEMATOCRIT: 33.2 % — AB (ref 36.0–46.0)
HEMOGLOBIN: 10.6 g/dL — AB (ref 12.0–15.0)
Lymphocytes Relative: 27 %
Lymphs Abs: 1.4 10*3/uL (ref 0.7–4.0)
MCH: 23.1 pg — ABNORMAL LOW (ref 26.0–34.0)
MCHC: 31.9 g/dL (ref 30.0–36.0)
MCV: 72.3 fL — ABNORMAL LOW (ref 78.0–100.0)
MONO ABS: 0.6 10*3/uL (ref 0.1–1.0)
MONOS PCT: 11 %
NEUTROS ABS: 3.1 10*3/uL (ref 1.7–7.7)
NEUTROS PCT: 60 %
Platelets: 260 10*3/uL (ref 150–400)
RBC: 4.59 MIL/uL (ref 3.87–5.11)
RDW: 19.5 % — AB (ref 11.5–15.5)
WBC: 5.2 10*3/uL (ref 4.0–10.5)

## 2017-09-21 LAB — LIPASE, BLOOD: LIPASE: 28 U/L (ref 11–51)

## 2017-09-21 LAB — COMPREHENSIVE METABOLIC PANEL
ALK PHOS: 122 U/L (ref 38–126)
ALT: 15 U/L (ref 14–54)
ANION GAP: 5 (ref 5–15)
AST: 24 U/L (ref 15–41)
Albumin: 3.5 g/dL (ref 3.5–5.0)
BILIRUBIN TOTAL: 0.3 mg/dL (ref 0.3–1.2)
BUN: 18 mg/dL (ref 6–20)
CALCIUM: 9 mg/dL (ref 8.9–10.3)
CO2: 27 mmol/L (ref 22–32)
Chloride: 107 mmol/L (ref 101–111)
Creatinine, Ser: 0.6 mg/dL (ref 0.44–1.00)
GFR calc Af Amer: >60 mL/min (ref >60)
Glucose, Bld: 90 mg/dL (ref 65–99)
POTASSIUM: 3.9 mmol/L (ref 3.5–5.1)
Sodium: 139 mmol/L (ref 135–145)
TOTAL PROTEIN: 7.6 g/dL (ref 6.5–8.1)

## 2017-09-21 LAB — URINALYSIS, MICROSCOPIC (REFLEX)

## 2017-09-21 MED ORDER — KETOROLAC TROMETHAMINE 15 MG/ML IJ SOLN
15.0000 mg | Freq: Once | INTRAMUSCULAR | Status: AC
  Administered 2017-09-21: 15 mg via INTRAVENOUS
  Filled 2017-09-21: qty 1

## 2017-09-21 MED ORDER — IOPAMIDOL (ISOVUE-300) INJECTION 61%
100.0000 mL | Freq: Once | INTRAVENOUS | Status: AC | PRN
  Administered 2017-09-21: 100 mL via INTRAVENOUS

## 2017-09-21 MED ORDER — CEPHALEXIN 500 MG PO CAPS: 500.0000 mg | ORAL_CAPSULE | Freq: Three times a day (TID) | ORAL | 0 refills | Status: DC

## 2017-09-21 MED ORDER — HYDROMORPHONE HCL 1 MG/ML IJ SOLN
0.5000 mg | Freq: Once | INTRAMUSCULAR | Status: AC
  Administered 2017-09-21: 0.5 mg via INTRAVENOUS
  Filled 2017-09-21: qty 1

## 2017-09-21 MED ORDER — SODIUM CHLORIDE 0.9 % IV BOLUS (SEPSIS)
500.0000 mL | Freq: Once | INTRAVENOUS | Status: AC
  Administered 2017-09-21: 500 mL via INTRAVENOUS

## 2017-09-21 MED ORDER — DEXTROSE 5 % IV SOLN
1.0000 g | Freq: Once | INTRAVENOUS | Status: AC
  Administered 2017-09-21: 1 g via INTRAVENOUS
  Filled 2017-09-21: qty 10

## 2017-09-21 NOTE — ED Notes (Signed)
Pt returned momentarily and is now going for CT.

## 2017-09-21 NOTE — ED Notes (Signed)
Patient transported to CT 

## 2017-09-21 NOTE — ED Notes (Signed)
Contacted Dr. Gloriann Loan @ [phone] @ Dr. Wilma Flavin request

## 2017-09-21 NOTE — ED Triage Notes (Signed)
Pt to room 10 in w/c, states she needs to urinate urgently prior to triage. bsc brought to bedside, pt assisted to bsc by her husband with 2 staff assist. Peri care performed, pt assisted to bed by 2 staff assist. Pt reports 2 months of pain to her right upper thigh/groin area, over last few days has traveled to her rlq/pelvic area. Denies n/v/fevers, states she is usually constipated and her last bm was Friday which is normal for her. Pt states she presented to primary care who sent her here to rule out a bowel obstruction.

## 2017-09-21 NOTE — ED Notes (Signed)
Pt having bedside US at this time.

## 2017-09-21 NOTE — Consult Note (Addendum)
H&P Physician requesting consult:  Addison Lank, MD  Chief Complaint: Right lower quadrant abdominal pain  History of Present Illness: 72 year old female who presented to an outside emergency department with acute right lower quadrant pain.  She had a CT scan performed of the abdomen and pelvis which revealed a 1 cm L EFT ureteropelvic junction calculus with associated inflammatory changes.  She denies having any left flank pain.  She has no left abdominal pain.  She has mild increased frequency of urination but otherwise no dysuria or changing and voiding patterns.  Her urinalysis was consistent with possible urinary tract infection.  She denies having a history of kidney stones.  She does have a punctate right renal calculus.  She denies any fevers, chills, nausea, vomiting.  She continues to have right lower quadrant pain.  Past Medical History:  Diagnosis Date  . GERD (gastroesophageal reflux disease)   . Multiple sclerosis ([place])   . Osteoporosis    Past Surgical History:  Procedure Laterality Date  . ABDOMINAL HYSTERECTOMY    . ESOPHAGOGASTRODUODENOSCOPY N/A 11/06/2012   Procedure: ESOPHAGOGASTRODUODENOSCOPY (EGD);  Surgeon: Lear Ng, MD;  Location: [facility] ENDOSCOPY;  Service: Endoscopy;  Laterality: N/A;    Home Medications:   (Not in a hospital admission) Allergies:  Allergies  Allergen Reactions  . Other Itching    Walnuts, honeydew, watermelon, catanlope, bananas  . Epinephrine Anxiety    Hallucinations     History reviewed. No pertinent family history. Social History:  reports that  has never smoked. she has never used smokeless tobacco. She reports that she does not drink alcohol or use drugs.  ROS: A complete review of systems was performed.  All systems are negative except for pertinent findings as noted. ROS   Physical Exam:  Vital signs in last 24 hours: Temp:  [97.6 F (36.4 C)] 97.6 F (36.4 C) (01/08 2125) Pulse Rate:  [69-78] 78 (01/08  2125) Resp:  [14-20] 14 (01/08 2125) BP: (126-165)/(70-98) 126/72 (01/08 2125) SpO2:  [91 %-100 %] 91 % (01/08 2125) Weight:  [77.1 kg (170 lb)] 77.1 kg (170 lb) (01/08 2125) General:  Alert and oriented, No acute distress HEENT: Normocephalic, atraumatic Neck: No JVD or lymphadenopathy Cardiovascular: Regular rate and rhythm Lungs: Regular rate and effort Abdomen: Soft, mild RLQ tenderness difficult to localize, nondistended, no abdominal masses Back: No CVA tenderness Extremities: No edema Neurologic: Grossly intact  Laboratory Data:  Results for orders placed or performed during the hospital encounter of 09/21/17 (from the past 24 hour(s))  Urinalysis, Routine w reflex microscopic     Status: Abnormal   Collection Time: 09/21/17  1:15 PM  Result Value Ref Range   Color, Urine YELLOW YELLOW   APPearance CLEAR CLEAR   Specific Gravity, Urine 1.020 1.005 - 1.030   pH 6.5 5.0 - 8.0   Glucose, UA NEGATIVE NEGATIVE mg/dL   Hgb urine dipstick LARGE (A) NEGATIVE   Bilirubin Urine NEGATIVE NEGATIVE   Ketones, ur NEGATIVE NEGATIVE mg/dL   Protein, ur NEGATIVE NEGATIVE mg/dL   Nitrite POSITIVE (A) NEGATIVE   Leukocytes, UA SMALL (A) NEGATIVE  Urinalysis, Microscopic (reflex)     Status: Abnormal   Collection Time: 09/21/17  1:15 PM  Result Value Ref Range   RBC / HPF 6-30 0 - 5 RBC/hpf   WBC, UA 6-30 0 - 5 WBC/hpf   Bacteria, UA MANY (A) NONE SEEN   Squamous Epithelial / LPF 0-5 (A) NONE SEEN   Budding Yeast PRESENT   Comprehensive metabolic  panel     Status: None   Collection Time: 09/21/17  1:55 PM  Result Value Ref Range   Sodium 139 135 - 145 mmol/L   Potassium 3.9 3.5 - 5.1 mmol/L   Chloride 107 101 - 111 mmol/L   CO2 27 22 - 32 mmol/L   Glucose, Bld 90 65 - 99 mg/dL   BUN 18 6 - 20 mg/dL   Creatinine, Ser 0.60 0.44 - 1.00 mg/dL   Calcium 9.0 8.9 - 10.3 mg/dL   Total Protein 7.6 6.5 - 8.1 g/dL   Albumin 3.5 3.5 - 5.0 g/dL   AST 24 15 - 41 U/L   ALT 15 14 - 54 U/L    Alkaline Phosphatase 122 38 - 126 U/L   Total Bilirubin 0.3 0.3 - 1.2 mg/dL   GFR calc non Af Amer >60 >60 mL/min   GFR calc Af Amer >60 >60 mL/min   Anion gap 5 5 - 15  Lipase, blood     Status: None   Collection Time: 09/21/17  1:55 PM  Result Value Ref Range   Lipase 28 11 - 51 U/L  CBC with Differential     Status: Abnormal   Collection Time: 09/21/17  1:55 PM  Result Value Ref Range   WBC 5.2 4.0 - 10.5 K/uL   RBC 4.59 3.87 - 5.11 MIL/uL   Hemoglobin 10.6 (L) 12.0 - 15.0 g/dL   HCT 33.2 (L) 36.0 - 46.0 %   MCV 72.3 (L) 78.0 - 100.0 fL   MCH 23.1 (L) 26.0 - 34.0 pg   MCHC 31.9 30.0 - 36.0 g/dL   RDW 19.5 (H) 11.5 - 15.5 %   Platelets 260 150 - 400 K/uL   Neutrophils Relative % 60 %   Neutro Abs 3.1 1.7 - 7.7 K/uL   Lymphocytes Relative 27 %   Lymphs Abs 1.4 0.7 - 4.0 K/uL   Monocytes Relative 11 %   Monocytes Absolute 0.6 0.1 - 1.0 K/uL   Eosinophils Relative 2 %   Eosinophils Absolute 0.1 0.0 - 0.7 K/uL   Basophils Relative 0 %   Basophils Absolute 0.0 0.0 - 0.1 K/uL   No results found for this or any previous visit (from the past 240 hour(s)). Creatinine: Recent Labs    09/21/17 1355  CREATININE 0.60    Impression/Assessment:  Left ureteropelvic junction calculus Right lower quadrant abdominal pain  Plan:  No urgent intervention needed for the left ureteropelvic junction calculus.  Recommend urine culture and give her antibiotics if the urine culture is positive.  I talked to her about elective left ureteroscopy with laser lithotripsy and ureteral stent placement and she is interested in this.  No urological cause for her right lower quadrant abdominal pain.  Marton Redwood, III 09/21/2017, 11:04 PM

## 2017-09-21 NOTE — ED Provider Notes (Addendum)
  Physical Exam  BP (!) 118/57 (BP Location: Right Arm)   Pulse 78   Temp 97.6 F (36.4 C) (Oral)   Resp 15   Ht 5\' 3"  (1.6 m)   Wt 77.1 kg (170 lb)   SpO2 100%   BMI 30.11 kg/m   Physical Exam  ED Course/Procedures     Procedures  MDM  Transfer from med center [facility] for infected obstructed ureteral stone.  Normal white count and no fever here but does have urine that looks like infection.  Pain now feels much better.  To be seen by Dr. Gloriann Loan in the ER.       Davonna Belling, MD 09/21/17 2133  Patient has been seen by Dr. Gloriann Loan.  The ureteral findings are on the left side and pain is exclusively on the right side.  He thinks this is not because of the pain.  There is no right upper quadrant tenderness.  Does have a gallstone but no wall thickening or other findings on the CT scan.  Not tender.  Biliary colic possible.  Also has had some constipation will treat for this.  Will also give muscle relaxers due to the patient's MS and muscle spasm type pain.  Will discharge home to follow-up with urology and PCP.  Given antibiotics for possible UTI.    Davonna Belling, MD 09/22/17 [phone]

## 2017-09-21 NOTE — ED Provider Notes (Signed)
Emergency Department Provider Note   I have reviewed the triage vital signs and the nursing notes.   HISTORY  Chief Complaint Abdominal Pain   HPI Heather Lutz is a 72 y.o. female with PMH of GERD, MD, and osteoporosis presents to the emergency department for evaluation of right-sided abdominal pain.  Pain is intermittent and worse with movement.  Patient has baseline constipation with last large bowel movement over the weekend with only small, hard stool since that time.  No fevers or chills.  No nausea or vomiting.  Pain occasionally travels to the lower extremities and is worse in the right thigh.  Patient describes a cramping sensation that area.  No lower back pain or numbness/weakness.   Past Medical History:  Diagnosis Date  . GERD (gastroesophageal reflux disease)   . Multiple sclerosis ([place])   . Osteoporosis     Patient Active Problem List   Diagnosis Date Noted  . Venous stasis 11/20/2014  . Lung nodule < 6cm on CT 11/20/2014  . Left lower lobe pneumonia ([place]) 11/17/2014  . Influenza A (H1N1) 11/06/2012  . Hematemesis 11/06/2012  . CAP (community acquired pneumonia) 11/05/2012  . Hypoxemia 11/05/2012  . SOB (shortness of breath) 11/05/2012  . Multiple sclerosis ([place]) 11/05/2012    Past Surgical History:  Procedure Laterality Date  . ABDOMINAL HYSTERECTOMY    . ESOPHAGOGASTRODUODENOSCOPY N/A 11/06/2012   Procedure: ESOPHAGOGASTRODUODENOSCOPY (EGD);  Surgeon: Lear Ng, MD;  Location: [facility] ENDOSCOPY;  Service: Endoscopy;  Laterality: N/A;    Current Outpatient Rx  . Order #: [id number] Class: Historical Med  . Order #: [id number] Class: Historical Med  . Order #: [id number] Class: Normal  . Order #: [id number] Class: No Print  . Order #: [id number] Class: Historical Med  . Order #: [id number] Class: Historical Med  . Order #: [id number] Class: Historical Med  . Order #: [id number] Class: Historical Med  . Order #: [id number] Class: Normal  . Order #:  [id number] Class: Historical Med  . Order #: [id number] Class: Historical Med  . Order #: [id number] Class: Print    Allergies Ace inhibitors; Other; and Epinephrine  History reviewed. No pertinent family history.  Social History Social History   Tobacco Use  . Smoking status: Never Smoker  . Smokeless tobacco: Never Used  Substance Use Topics  . Alcohol use: No    Alcohol/week: 0.0 oz  . Drug use: No    Review of Systems  Constitutional: No fever/chills Eyes: No visual changes. ENT: No sore throat. Cardiovascular: Denies chest pain. Respiratory: Denies shortness of breath. Gastrointestinal: Positive abdominal pain. Positive nausea, no vomiting.  No diarrhea. Positive constipation. Genitourinary: Negative for dysuria. Musculoskeletal: Negative for back pain. Positive cramping leg pain.  Skin: Negative for rash. Neurological: Negative for headaches, focal weakness or numbness.  10-point ROS otherwise negative.  ____________________________________________   PHYSICAL EXAM:  VITAL SIGNS: Vitals:   09/21/17 1415 09/21/17 1430  BP:  (!) 158/81  Pulse: 77 72  Resp:  20  SpO2: 100% 98%    Constitutional: Alert and oriented. Well appearing and in no acute distress. Eyes: Conjunctivae are normal.  Head: Atraumatic. Nose: No congestion/rhinnorhea. Mouth/Throat: Mucous membranes are moist.  Neck: No stridor.  Cardiovascular: Normal rate, regular rhythm. Good peripheral circulation. Grossly normal heart sounds.   Respiratory: Normal respiratory effort.  No retractions. Lungs CTAB. Gastrointestinal: Soft with focal RLQ tenderness. No rebound or guarding. No distention.  Musculoskeletal: No lower extremity tenderness nor edema. No gross deformities of extremities. No thigh/leg swelling. No erythema or warmth.  Neurologic:  Normal speech and language. Baseline LE and truncal weakness (baseline).  Skin:  Skin is warm, dry and intact. No rash  noted.  ____________________________________________   LABS (all labs ordered are listed, but only abnormal results are displayed)  Labs Reviewed  URINALYSIS, ROUTINE W REFLEX MICROSCOPIC - Abnormal; Notable for the following components:      Result Value   Hgb urine dipstick LARGE (*)    Nitrite POSITIVE (*)    Leukocytes, UA SMALL (*)    All other components within normal limits  CBC WITH DIFFERENTIAL/PLATELET - Abnormal; Notable for the following components:   Hemoglobin 10.6 (*)    HCT 33.2 (*)    MCV 72.3 (*)    MCH 23.1 (*)    RDW 19.5 (*)    All other components within normal limits  URINALYSIS, MICROSCOPIC (REFLEX) - Abnormal; Notable for the following components:   Bacteria, UA MANY (*)    Squamous Epithelial / LPF 0-5 (*)    All other components within normal limits  COMPREHENSIVE METABOLIC PANEL  LIPASE, BLOOD   ____________________________________________  RADIOLOGY  US Venous Img Lower Right (dvt Study)  Result Date: 09/21/2017 CLINICAL DATA:  Right lower extremity pain and edema. History of venous stasis. Evaluate for DVT. EXAM: RIGHT LOWER EXTREMITY VENOUS DOPPLER ULTRASOUND TECHNIQUE: Gray-scale sonography with graded compression, as well as color Doppler and duplex ultrasound were performed to evaluate the lower extremity deep venous systems from the level of the common femoral vein and including the common femoral, femoral, profunda femoral, popliteal and calf veins including the posterior tibial, peroneal and gastrocnemius veins when visible. The superficial great saphenous vein was also interrogated. Spectral Doppler was utilized to evaluate flow at rest and with distal augmentation maneuvers in the common femoral, femoral and popliteal veins. COMPARISON:  None. FINDINGS: Contralateral Common Femoral Vein: Respiratory phasicity is normal and symmetric with the symptomatic side. No evidence of thrombus. Normal compressibility. Common Femoral Vein: No evidence of  thrombus. Normal compressibility, respiratory phasicity and response to augmentation. Saphenofemoral Junction: No evidence of thrombus. Normal compressibility and flow on color Doppler imaging. Profunda Femoral Vein: No evidence of thrombus. Normal compressibility and flow on color Doppler imaging. Femoral Vein: No evidence of thrombus. Normal compressibility, respiratory phasicity and response to augmentation. Popliteal Vein: No evidence of thrombus. Normal compressibility, respiratory phasicity and response to augmentation. Calf Veins: No evidence of thrombus. Normal compressibility and flow on color Doppler imaging. Superficial Great Saphenous Vein: No evidence of thrombus. Normal compressibility. Venous Reflux:  None. Other Findings: There is a minimal amount of subcutaneous edema at the level of the right ankle. IMPRESSION: No evidence of DVT within the right lower extremity. Electronically Signed   By: Sandi Mariscal M.D.   On: 09/21/2017 14:27    ____________________________________________   PROCEDURES  Procedure(s) performed:   Procedures  None  ____________________________________________   INITIAL IMPRESSION / ASSESSMENT AND PLAN / ED COURSE  Pertinent labs & imaging results that were available during my care of the patient were reviewed by me and considered in my medical decision making (see chart for details).  Worse with movement.  She has mild tenderness in this area without rebound or guarding.  Low suspicion for DVT in the legs.  Symptoms are more consistent with spasm.  Plan for ultrasound for further evaluation.  Also considering CT scan of the abdomen and pelvis given the patient's age and complicated past medical history.   DVT US negative. Performed rectal exam with no fecal impaction. CT  pending. Care transferred to Dr. Leonette Monarch who will follow CT results and reassess.  ____________________________________________  FINAL CLINICAL IMPRESSION(S) / ED DIAGNOSES  Final  diagnoses:  RLQ abdominal pain    Note:  This document was prepared using Dragon voice recognition software and may include unintentional dictation errors.  Nanda Quinton, MD Emergency Medicine    Long, Wonda [facility], MD 09/21/17 [phone]

## 2017-09-21 NOTE — ED Notes (Signed)
Patient's husband: [phone]

## 2017-09-21 NOTE — ED Provider Notes (Signed)
I assumed care of this patient from Dr. Laverta Baltimore at 1600.  Please see their note for further details of Hx, PE.  Briefly patient is a 72 y.o. female who presented with with a history of multiple sclerosis who presents with right-sided flank and quadrant abdominal pain.  Labs were grossly reassuring.  Urinalysis revealed urinary tract infection.  Given the right flank and abdominal symptoms, is a concern for pyelonephritis.  CT scan is currently pending due to malfunction..   Current plan is to follow-up on CT scan treat accordingly..  CT revealed a 10 mm stone in the right ureter with mild hydronephrosis.  UA still with urinary tract infection.  She was given IV Rocephin.  Given Toradol and Dilaudid for pain which resulted in mild improvement.  Discussed case with Dr. Gloriann Loan from urology who requested ED to ED transfer for admission.    Fatima Blank, MD 09/23/17 [phone]

## 2017-09-21 NOTE — ED Notes (Signed)
Pt returned from CT °

## 2017-09-21 NOTE — ED Notes (Signed)
Patient transported to X-ray 

## 2017-09-22 MED ORDER — BACLOFEN 10 MG PO TABS: 10.0000 mg | ORAL_TABLET | Freq: Three times a day (TID) | ORAL | 0 refills | Status: AC | PRN

## 2017-09-22 MED ORDER — POLYETHYLENE GLYCOL 3350 17 G PO PACK: 17.0000 g | PACK | Freq: Every day | ORAL | 0 refills | Status: DC

## 2017-09-22 MED ORDER — CEPHALEXIN 500 MG PO CAPS: 500.0000 mg | ORAL_CAPSULE | Freq: Three times a day (TID) | ORAL | 0 refills | Status: AC

## 2017-09-22 NOTE — Discharge Instructions (Signed)
Watch out for signs of left flank pain and fevers.  He will need more expeditious follow-up with urology if this happens.  We will give muscle relaxers and some laxatives and hopefully this will help with the pain.

## 2018-06-25 ENCOUNTER — Encounter (HOSPITAL_BASED_OUTPATIENT_CLINIC_OR_DEPARTMENT_OTHER): Payer: Self-pay

## 2018-06-25 ENCOUNTER — Other Ambulatory Visit: Payer: Self-pay

## 2018-06-25 ENCOUNTER — Emergency Department (HOSPITAL_BASED_OUTPATIENT_CLINIC_OR_DEPARTMENT_OTHER): Payer: Medicare Other

## 2018-06-25 ENCOUNTER — Inpatient Hospital Stay (HOSPITAL_BASED_OUTPATIENT_CLINIC_OR_DEPARTMENT_OTHER)
Admission: EM | Admit: 2018-06-25 | Discharge: 2018-06-30 | DRG: 872 | Disposition: A | Discharge status: Skilled Nursing Facility | Payer: Medicare Other | Attending: Internal Medicine | Admitting: Internal Medicine

## 2018-06-25 DIAGNOSIS — Z90710 Acquired absence of both cervix and uterus: Secondary | ICD-10-CM

## 2018-06-25 DIAGNOSIS — E46 Unspecified protein-calorie malnutrition: Secondary | ICD-10-CM | POA: Diagnosis present

## 2018-06-25 DIAGNOSIS — R52 Pain, unspecified: Secondary | ICD-10-CM

## 2018-06-25 DIAGNOSIS — K219 Gastro-esophageal reflux disease without esophagitis: Secondary | ICD-10-CM | POA: Diagnosis not present

## 2018-06-25 DIAGNOSIS — Z91018 Allergy to other foods: Secondary | ICD-10-CM | POA: Diagnosis not present

## 2018-06-25 DIAGNOSIS — M810 Age-related osteoporosis without current pathological fracture: Secondary | ICD-10-CM | POA: Diagnosis present

## 2018-06-25 DIAGNOSIS — N3001 Acute cystitis with hematuria: Secondary | ICD-10-CM

## 2018-06-25 DIAGNOSIS — Z6830 Body mass index (BMI) 30.0-30.9, adult: Secondary | ICD-10-CM

## 2018-06-25 DIAGNOSIS — I447 Left bundle-branch block, unspecified: Secondary | ICD-10-CM | POA: Diagnosis not present

## 2018-06-25 DIAGNOSIS — N136 Pyonephrosis: Secondary | ICD-10-CM | POA: Diagnosis not present

## 2018-06-25 DIAGNOSIS — R109 Unspecified abdominal pain: Secondary | ICD-10-CM

## 2018-06-25 DIAGNOSIS — Z23 Encounter for immunization: Secondary | ICD-10-CM

## 2018-06-25 DIAGNOSIS — R911 Solitary pulmonary nodule: Secondary | ICD-10-CM | POA: Diagnosis not present

## 2018-06-25 DIAGNOSIS — A4181 Sepsis due to Enterococcus: Principal | ICD-10-CM | POA: Diagnosis present

## 2018-06-25 DIAGNOSIS — Z8249 Family history of ischemic heart disease and other diseases of the circulatory system: Secondary | ICD-10-CM | POA: Diagnosis not present

## 2018-06-25 DIAGNOSIS — G35 Multiple sclerosis: Secondary | ICD-10-CM | POA: Diagnosis present

## 2018-06-25 DIAGNOSIS — Z7401 Bed confinement status: Secondary | ICD-10-CM | POA: Diagnosis not present

## 2018-06-25 DIAGNOSIS — E876 Hypokalemia: Secondary | ICD-10-CM | POA: Diagnosis not present

## 2018-06-25 DIAGNOSIS — R0602 Shortness of breath: Secondary | ICD-10-CM

## 2018-06-25 DIAGNOSIS — Z888 Allergy status to other drugs, medicaments and biological substances status: Secondary | ICD-10-CM

## 2018-06-25 DIAGNOSIS — D509 Iron deficiency anemia, unspecified: Secondary | ICD-10-CM | POA: Diagnosis present

## 2018-06-25 DIAGNOSIS — K449 Diaphragmatic hernia without obstruction or gangrene: Secondary | ICD-10-CM | POA: Diagnosis not present

## 2018-06-25 DIAGNOSIS — N390 Urinary tract infection, site not specified: Secondary | ICD-10-CM | POA: Diagnosis present

## 2018-06-25 DIAGNOSIS — H548 Legal blindness, as defined in USA: Secondary | ICD-10-CM | POA: Diagnosis not present

## 2018-06-25 DIAGNOSIS — R531 Weakness: Secondary | ICD-10-CM

## 2018-06-25 DIAGNOSIS — Z993 Dependence on wheelchair: Secondary | ICD-10-CM | POA: Diagnosis not present

## 2018-06-25 DIAGNOSIS — R609 Edema, unspecified: Secondary | ICD-10-CM

## 2018-06-25 DIAGNOSIS — A419 Sepsis, unspecified organism: Secondary | ICD-10-CM | POA: Diagnosis not present

## 2018-06-25 HISTORY — DX: Unspecified visual loss: H54.7

## 2018-06-25 LAB — TROPONIN I
Troponin I: <0.03 ng/mL (ref <0.03)
Troponin I: 0.06 ng/mL (ref <0.03)

## 2018-06-25 LAB — URINALYSIS, ROUTINE W REFLEX MICROSCOPIC
BILIRUBIN URINE: NEGATIVE
Glucose, UA: NEGATIVE mg/dL
KETONES UR: 15 mg/dL — AB
NITRITE: POSITIVE — AB
PH: 8 (ref 5.0–8.0)
Protein, ur: NEGATIVE mg/dL
Specific Gravity, Urine: 1.02 (ref 1.005–1.030)

## 2018-06-25 LAB — PROTIME-INR
INR: 1.03
PROTHROMBIN TIME: 13.4 s (ref 11.4–15.2)

## 2018-06-25 LAB — COMPREHENSIVE METABOLIC PANEL
ALBUMIN: 3.7 g/dL (ref 3.5–5.0)
ALT: 14 U/L (ref 0–44)
ANION GAP: 13 (ref 5–15)
AST: 25 U/L (ref 15–41)
Alkaline Phosphatase: 111 U/L (ref 38–126)
BILIRUBIN TOTAL: 0.5 mg/dL (ref 0.3–1.2)
BUN: 21 mg/dL (ref 8–23)
CO2: 23 mmol/L (ref 22–32)
Calcium: 9.3 mg/dL (ref 8.9–10.3)
Chloride: 103 mmol/L (ref 98–111)
Creatinine, Ser: 0.95 mg/dL (ref 0.44–1.00)
GFR calc Af Amer: >60 mL/min (ref >60)
GFR calc non Af Amer: 58 mL/min — ABNORMAL LOW (ref >60)
GLUCOSE: 101 mg/dL — AB (ref 70–99)
POTASSIUM: 4 mmol/L (ref 3.5–5.1)
Sodium: 139 mmol/L (ref 135–145)
TOTAL PROTEIN: 7.5 g/dL (ref 6.5–8.1)

## 2018-06-25 LAB — I-STAT CG4 LACTIC ACID, ED: LACTIC ACID, VENOUS: 0.96 mmol/L (ref 0.5–1.9)

## 2018-06-25 LAB — CBC WITH DIFFERENTIAL/PLATELET
ABS IMMATURE GRANULOCYTES: 0.06 10*3/uL (ref 0.00–0.07)
BASOS ABS: 0 10*3/uL (ref 0.0–0.1)
Basophils Relative: 0 %
EOS ABS: 0 10*3/uL (ref 0.0–0.5)
Eosinophils Relative: 0 %
HCT: 31.8 % — ABNORMAL LOW (ref 36.0–46.0)
HEMOGLOBIN: 9.7 g/dL — AB (ref 12.0–15.0)
IMMATURE GRANULOCYTES: 0 %
LYMPHS ABS: 0.8 10*3/uL (ref 0.7–4.0)
Lymphocytes Relative: 6 %
MCH: 21.5 pg — ABNORMAL LOW (ref 26.0–34.0)
MCHC: 30.5 g/dL (ref 30.0–36.0)
MCV: 70.5 fL — ABNORMAL LOW (ref 80.0–100.0)
Monocytes Absolute: 1.1 10*3/uL — ABNORMAL HIGH (ref 0.1–1.0)
Monocytes Relative: 8 %
NEUTROS PCT: 86 %
NRBC: 0 % (ref 0.0–0.2)
Neutro Abs: 11.7 10*3/uL — ABNORMAL HIGH (ref 1.7–7.7)
Platelets: 204 10*3/uL (ref 150–400)
RBC: 4.51 MIL/uL (ref 3.87–5.11)
RDW: 20.8 % — ABNORMAL HIGH (ref 11.5–15.5)
WBC: 13.7 10*3/uL — ABNORMAL HIGH (ref 4.0–10.5)

## 2018-06-25 LAB — D-DIMER, QUANTITATIVE: D-Dimer, Quant: 0.63 ug/mL-FEU — ABNORMAL HIGH (ref 0.00–0.50)

## 2018-06-25 LAB — LIPASE, BLOOD: LIPASE: 23 U/L (ref 11–51)

## 2018-06-25 LAB — URINALYSIS, MICROSCOPIC (REFLEX)

## 2018-06-25 LAB — TSH: TSH: 1.286 u[IU]/mL (ref 0.350–4.500)

## 2018-06-25 MED ORDER — DORZOLAMIDE HCL 2 % OP SOLN
1.0000 [drp] | Freq: Three times a day (TID) | OPHTHALMIC | Status: DC
  Filled 2018-06-25: qty 10

## 2018-06-25 MED ORDER — LATANOPROST 0.005 % OP SOLN
1.0000 [drp] | Freq: Every day | OPHTHALMIC | Status: DC
  Filled 2018-06-25: qty 2.5

## 2018-06-25 MED ORDER — TIMOLOL MALEATE 0.5 % OP SOLN
1.0000 [drp] | Freq: Every day | OPHTHALMIC | Status: DC
  Filled 2018-06-25: qty 5

## 2018-06-25 MED ORDER — SODIUM CHLORIDE 0.9 % IV SOLN
1000.0000 mL | INTRAVENOUS | Status: DC
  Administered 2018-06-25: 1000 mL via INTRAVENOUS

## 2018-06-25 MED ORDER — SODIUM CHLORIDE 0.9 % IV SOLN
Freq: Once | INTRAVENOUS | Status: AC
  Administered 2018-06-25: 16:00:00 via INTRAVENOUS

## 2018-06-25 MED ORDER — ACETAMINOPHEN 325 MG PO TABS
650.0000 mg | ORAL_TABLET | Freq: Four times a day (QID) | ORAL | Status: DC | PRN
  Administered 2018-06-26 – 2018-06-28 (×4): 650 mg via ORAL
  Filled 2018-06-25 (×4): qty 2

## 2018-06-25 MED ORDER — VANCOMYCIN HCL IN DEXTROSE 750-5 MG/150ML-% IV SOLN
750.0000 mg | Freq: Two times a day (BID) | INTRAVENOUS | Status: DC
  Administered 2018-06-26: 750 mg via INTRAVENOUS
  Filled 2018-06-25 (×2): qty 150

## 2018-06-25 MED ORDER — ACETAMINOPHEN 325 MG PO TABS
ORAL_TABLET | ORAL | Status: AC
  Filled 2018-06-25: qty 1

## 2018-06-25 MED ORDER — VITAMIN D3 1.25 MG (50000 UT) PO CAPS: 1.0000 | ORAL_CAPSULE | ORAL | Status: DC

## 2018-06-25 MED ORDER — VANCOMYCIN HCL 10 G IV SOLR
1500.0000 mg | Freq: Once | INTRAVENOUS | Status: AC
  Administered 2018-06-25: 1500 mg via INTRAVENOUS
  Filled 2018-06-25: qty 1500

## 2018-06-25 MED ORDER — SODIUM CHLORIDE 0.9 % IV BOLUS (SEPSIS)
1000.0000 mL | Freq: Once | INTRAVENOUS | Status: AC
  Administered 2018-06-25: 1000 mL via INTRAVENOUS

## 2018-06-25 MED ORDER — NAPROXEN SODIUM 220 MG PO TABS: 220.0000 mg | ORAL_TABLET | Freq: Two times a day (BID) | ORAL | Status: DC | PRN

## 2018-06-25 MED ORDER — ENOXAPARIN SODIUM 40 MG/0.4ML ~~LOC~~ SOLN
40.0000 mg | SUBCUTANEOUS | Status: DC
  Administered 2018-06-25 – 2018-06-29 (×5): 40 mg via SUBCUTANEOUS
  Filled 2018-06-25 (×5): qty 0.4

## 2018-06-25 MED ORDER — SODIUM CHLORIDE 0.9 % IV SOLN
INTRAVENOUS | Status: DC
  Administered 2018-06-25: 23:00:00 via INTRAVENOUS

## 2018-06-25 MED ORDER — TIMOLOL HEMIHYDRATE 0.5 % OP SOLN: 1.0000 [drp] | Freq: Every day | OPHTHALMIC | Status: DC

## 2018-06-25 MED ORDER — SODIUM CHLORIDE 0.9 % IV SOLN
2.0000 g | Freq: Two times a day (BID) | INTRAVENOUS | Status: DC
  Administered 2018-06-25 – 2018-06-26 (×2): 2 g via INTRAVENOUS
  Filled 2018-06-25 (×3): qty 2

## 2018-06-25 MED ORDER — VITAMIN D (ERGOCALCIFEROL) 1.25 MG (50000 UNIT) PO CAPS
50000.0000 [IU] | ORAL_CAPSULE | ORAL | Status: DC
  Administered 2018-06-27: 50000 [IU] via ORAL
  Filled 2018-06-25: qty 1

## 2018-06-25 MED ORDER — POLYETHYLENE GLYCOL 3350 17 G PO PACK
17.0000 g | PACK | Freq: Every day | ORAL | Status: DC
  Administered 2018-06-26: 17 g via ORAL
  Filled 2018-06-25 (×3): qty 1

## 2018-06-25 MED ORDER — BACLOFEN 10 MG PO TABS: 10.0000 mg | ORAL_TABLET | Freq: Three times a day (TID) | ORAL | Status: DC | PRN

## 2018-06-25 MED ORDER — ACETAMINOPHEN 650 MG RE SUPP: 650.0000 mg | Freq: Four times a day (QID) | RECTAL | Status: DC | PRN

## 2018-06-25 MED ORDER — PIPERACILLIN-TAZOBACTAM 3.375 G IVPB 30 MIN
3.3750 g | Freq: Once | INTRAVENOUS | Status: AC
  Administered 2018-06-25: 3.375 g via INTRAVENOUS
  Filled 2018-06-25 (×2): qty 50

## 2018-06-25 MED ORDER — ACETAMINOPHEN 325 MG PO TABS
650.0000 mg | ORAL_TABLET | Freq: Once | ORAL | Status: AC
  Administered 2018-06-25: 650 mg via ORAL
  Filled 2018-06-25: qty 2

## 2018-06-25 MED ORDER — NAPROXEN SODIUM 275 MG PO TABS
275.0000 mg | ORAL_TABLET | Freq: Two times a day (BID) | ORAL | Status: DC | PRN
  Administered 2018-06-26: 275 mg via ORAL
  Filled 2018-06-25 (×2): qty 1

## 2018-06-25 MED ORDER — MORPHINE SULFATE (PF) 4 MG/ML IV SOLN
4.0000 mg | Freq: Once | INTRAVENOUS | Status: AC
  Administered 2018-06-25: 4 mg via INTRAVENOUS
  Filled 2018-06-25: qty 1

## 2018-06-25 MED ORDER — BRIMONIDINE TARTRATE 0.2 % OP SOLN
1.0000 [drp] | Freq: Three times a day (TID) | OPHTHALMIC | Status: DC
  Filled 2018-06-25: qty 5

## 2018-06-25 MED ORDER — MORPHINE SULFATE (PF) 2 MG/ML IV SOLN
2.0000 mg | Freq: Once | INTRAVENOUS | Status: AC
  Administered 2018-06-25: 2 mg via INTRAVENOUS
  Filled 2018-06-25: qty 1

## 2018-06-25 NOTE — ED Triage Notes (Signed)
Pt arrived via [facility]. EMS report pt has a hx of MS. Worsening muscle spasms x 3 months with an acute worsening of spasms on 10/10. Pt states increased pain to R leg today.

## 2018-06-25 NOTE — ED Provider Notes (Signed)
[place] EMERGENCY DEPARTMENT Provider Note   CSN: [id number] Arrival date & time: 06/25/18  1352     History   Chief Complaint No chief complaint on file.   HPI Heather Lutz is a 72 y.o. female.  HPI Has multiple sclerosis and is wheelchair-bound at baseline.  Over the past several weeks she has been having increasing difficulty with strength and use of her lower extremities.  She did not have good function but she reports that for a while she was able to assist her caregivers in transferring to the bathroom and use handrails.  For this reason she did not start on baclofen or other medications because she felt that she wanted to maintain as much function as possible.  Over the past several days however she has been getting episodes of severe spasmodic pain in her hips and back and legs.  This seems to be more to the right than the left but both are involved.  She reports then she is getting pain in her back, shoulders and neck.  Some of this is exacerbated by lying flat.  She reports she cannot lie down.  No nausea or vomiting.  She did have an episode of right-sided facial numbness.  This is not typical for her.  He does not describe the other extremity is being specifically weaker or number than usual.  At patient's baseline,  she typically has no significant pain. Past Medical History:  Diagnosis Date  . Blindness   . GERD (gastroesophageal reflux disease)   . Multiple sclerosis ([place])   . Osteoporosis     Patient Active Problem List   Diagnosis Date Noted  . Venous stasis 11/20/2014  . Lung nodule < 6cm on CT 11/20/2014  . Left lower lobe pneumonia ([place]) 11/17/2014  . Influenza A (H1N1) 11/06/2012  . Hematemesis 11/06/2012  . CAP (community acquired pneumonia) 11/05/2012  . Hypoxemia 11/05/2012  . SOB (shortness of breath) 11/05/2012  . Multiple sclerosis ([place]) 11/05/2012    Past Surgical History:  Procedure Laterality Date  . ABDOMINAL HYSTERECTOMY     . ESOPHAGOGASTRODUODENOSCOPY N/A 11/06/2012   Procedure: ESOPHAGOGASTRODUODENOSCOPY (EGD);  Surgeon: Lear Ng, MD;  Location[facility] END[facility]COPY;  Service: Endoscopy;  Laterality: N/A;     OB History   None      Home Medications    Prior to Admission medications   Medication Sig Start Date End Date Taking? Authorizing Provider  baclofen (LIORESAL) 10 MG tablet Take 1 tablet (10 mg total) by mouth 3 (three) times daily as needed for muscle spasms. 09/22/17   Davonna Belling, MD  brimonidine (ALPHAGAN) 0.2 % ophthalmic solution Place 1 drop into the right eye 3 (three) times daily.    [provider]  Cholecalciferol (VITAMIN D3) 50000 units CAPS Take 1 capsule by mouth once a week. On Tuesday    [provider]  dorzolamide (TRUSOPT) 2 % ophthalmic solution Place 1 drop into the right eye 3 (three) times daily.    [provider]  latanoprost (XALATAN) 0.005 % ophthalmic solution Place 1 drop into both eyes at bedtime. Reported on 02/03/2016    [provider]  Multiple Vitamin (MULTIVITAMIN WITH MINERALS) TABS tablet Take 1 tablet by mouth daily.    [provider]  naproxen sodium (ANAPROX) 220 MG tablet Take 220-440 mg by mouth 2 (two) times daily as needed (pain).    [provider]  polyethylene glycol (MIRALAX / GLYCOLAX) packet Take 17 g by mouth daily.  09/21/17   Davonna Belling, MD  timolol (BETIMOL) 0.5 % ophthalmic solution Place 1 drop into both eyes daily.    [provider]    Family History No family history on file.  Social History Social History   Tobacco Use  . Smoking status: Never Smoker  . Smokeless tobacco: Never Used  Substance Use Topics  . Alcohol use: No    Alcohol/week: 0.0 standard drinks  . Drug use: No     Allergies   Other and Epinephrine   Review of Systems Review of Systems 10 Systems reviewed and are negative for acute change except as noted in the HPI.   Physical  Exam Updated Vital Signs BP 140/68 (BP Location: Left Arm)   Pulse (!) 119   Temp 99.5 F (37.5 C) (Oral)   Resp (!) 21   Ht 5\' 3"  (1.6 m)   Wt 77.1 kg   SpO2 96%   BMI 30.11 kg/m   Physical Exam  Constitutional:  Patient is alert and interactive.  No respiratory distress at rest.  She appears generally uncomfortable.  Well nourished and well-developed.  HENT:  Head: Normocephalic and atraumatic.  Mouth/Throat: Oropharynx is clear and moist.  Eyes: Pupils are equal, round, and reactive to light. EOM are normal.  Cardiovascular:  Borderline tachycardia.  No gross rub murmur gallop.  Pulmonary/Chest: Effort normal and breath sounds normal.  Abdominal: Soft. She exhibits no distension. There is no tenderness. There is no guarding.  Musculoskeletal:  She has limited range of motion due to underlying multiple sclerosis.  Both lower extremities are fairly stiff.  Both have approximately 1+/2+ edema.  No significant focal erythema.  No wounds.  Neurological: She is alert.  Patient has neurologic deficits secondary to MS.  She generally is weak and has difficulty with assisting in movement or position change.  She can move her upper extremities to reposition blankets and assist but has limited use of lower extremities which are very stiff.  Speech and cognitive function are normal.  Skin: Skin is warm and dry.  Psychiatric: She has a normal mood and affect.     ED Treatments / Results  Labs (all labs ordered are listed, but only abnormal results are displayed) Labs Reviewed  COMPREHENSIVE METABOLIC PANEL - Abnormal; Notable for the following components:      Result Value   Glucose, Bld 101 (*)    GFR calc non Af Amer 58 (*)    All other components within normal limits  CBC WITH DIFFERENTIAL/PLATELET - Abnormal; Notable for the following components:   WBC 13.7 (*)    Hemoglobin 9.7 (*)    HCT 31.8 (*)    MCV 70.5 (*)    MCH 21.5 (*)    RDW 20.8 (*)    Neutro Abs 11.7 (*)     Monocytes Absolute 1.1 (*)    All other components within normal limits  URINALYSIS, ROUTINE W REFLEX MICROSCOPIC - Abnormal; Notable for the following components:   APPearance CLOUDY (*)    Hgb urine dipstick MODERATE (*)    Ketones, ur 15 (*)    Nitrite POSITIVE (*)    Leukocytes, UA TRACE (*)    All other components within normal limits  URINALYSIS, MICROSCOPIC (REFLEX) - Abnormal; Notable for the following components:   Bacteria, UA MANY (*)    All other components within normal limits  URINE CULTURE  CULTURE, BLOOD (ROUTINE X 2)  CULTURE, BLOOD (ROUTINE X 2)  TROPONIN I  PROTIME-INR  LIPASE, BLOOD  I-STAT CG4 LACTIC ACID, ED    EKG EKG Interpretation  Date/Time:  Saturday June 25 2018 15:50:40 EDT Ventricular Rate:  117 PR Interval:    QRS Duration: 132 QT Interval:  352 QTC Calculation: 492 R Axis:   -30 Text Interpretation:  Sinus tachycardia Ventricular premature complex Left bundle branch block Baseline wander in lead(s) II III aVF V1 V6 old LBBB. no sig change from previous Confirmed by Charlesetta Shanks [phone]) on 06/25/2018 4:02:08 PM   Radiology Ct Head Wo Contrast  Result Date: 06/25/2018 CLINICAL DATA:  Neurologic deficit, subacute. Worsening muscle spasms. EXAM: CT HEAD WITHOUT CONTRAST TECHNIQUE: Contiguous axial images were obtained from the base of the skull through the vertex without intravenous contrast. COMPARISON:  None. FINDINGS: Brain: No evidence of acute infarction, hemorrhage, hydrocephalus, extra-axial collection or mass lesion/mass effect. There is mild diffuse low-attenuation within the subcortical and periventricular white matter compatible with chronic microvascular disease. Vascular: No hyperdense vessel or unexpected calcification. Skull: Normal. Negative for fracture or focal lesion. Sinuses/Orbits: The paranasal sinuses and mastoid air cells are clear. Within the anterior left globe there is a 2 mm calcific or metallic foreign body of  uncertain etiology. A normal appearing left lens is not visualized and this may be postsurgical. Other: None IMPRESSION: 1. No acute intracranial abnormalities. 2. Chronic small vessel ischemic change. 3. Small radiopaque foreign body within the anterior left globe is identified of uncertain clinical significance. This may be postsurgical or posttraumatic in etiology. Clinical correlation is advised. Electronically Signed   By: Kerby Moors M.D.   On: 06/25/2018 17:08   US Venous Img Lower Bilateral  Result Date: 06/25/2018 CLINICAL DATA:  Bilateral lower extremity swelling with right thigh pain EXAM: BILATERAL LOWER EXTREMITY VENOUS DOPPLER ULTRASOUND TECHNIQUE: Gray-scale sonography with graded compression, as well as color Doppler and duplex ultrasound were performed to evaluate the lower extremity deep venous systems from the level of the common femoral vein and including the common femoral, femoral, profunda femoral, popliteal and calf veins including the posterior tibial, peroneal and gastrocnemius veins when visible. The superficial great saphenous vein was also interrogated. Spectral Doppler was utilized to evaluate flow at rest and with distal augmentation maneuvers in the common femoral, femoral and popliteal veins. COMPARISON:  None. FINDINGS: RIGHT LOWER EXTREMITY Common Femoral Vein: No evidence of thrombus. Normal compressibility, respiratory phasicity and response to augmentation. Saphenofemoral Junction: No evidence of thrombus. Normal compressibility and flow on color Doppler imaging. Profunda Femoral Vein: No evidence of thrombus. Normal compressibility and flow on color Doppler imaging. Femoral Vein: No evidence of thrombus. Normal compressibility, respiratory phasicity and response to augmentation. Popliteal Vein: No evidence of thrombus. Normal compressibility, respiratory phasicity and response to augmentation. Calf Veins: No evidence of thrombus. Normal compressibility and flow on color  Doppler imaging. Superficial Great Saphenous Vein: No evidence of thrombus. Normal compressibility. Venous Reflux:  None. Other Findings:  None. LEFT LOWER EXTREMITY Common Femoral Vein: No evidence of thrombus. Normal compressibility, respiratory phasicity and response to augmentation. Saphenofemoral Junction: No evidence of thrombus. Normal compressibility and flow on color Doppler imaging. Profunda Femoral Vein: No evidence of thrombus. Normal compressibility and flow on color Doppler imaging. Femoral Vein: No evidence of thrombus. Normal compressibility, respiratory phasicity and response to augmentation. Popliteal Vein: No evidence of thrombus. Normal compressibility, respiratory phasicity and response to augmentation. Calf Veins: No evidence of thrombus. Normal compressibility and flow on color Doppler imaging. Superficial Great Saphenous Vein: No evidence of thrombus. Normal compressibility. Venous  Reflux:  None. Other Findings:  None. IMPRESSION: No evidence of deep venous thrombosis in either lower extremity. Electronically Signed   By: Ilona Sorrel M.D.   On: 06/25/2018 18:07   Dg Chest Port 1 View  Result Date: 06/25/2018 CLINICAL DATA:  Generalized pain EXAM: PORTABLE CHEST 1 VIEW COMPARISON:  09/21/2017 chest radiograph. FINDINGS: Stable cardiomediastinal silhouette with normal heart size and moderate hiatal hernia. No pneumothorax. No pleural effusion. Stable minimal left basilar scarring versus atelectasis. No pulmonary edema. No acute consolidative airspace disease. IMPRESSION: 1. Minimal scarring versus atelectasis at the left lung base. Otherwise no active cardiopulmonary disease. 2. Moderate hiatal hernia. Electronically Signed   By: Ilona Sorrel M.D.   On: 06/25/2018 17:03    Procedures Procedures (including critical care time) CRITICAL CARE Performed by: Charlesetta Shanks   Total critical care time: 30 minutes  Critical care time was exclusive of separately billable procedures and  treating other patients.  Critical care was necessary to treat or prevent imminent or life-threatening deterioration.  Critical care was time spent personally by me on the following activities: development of treatment plan with patient and/or surrogate as well as nursing, discussions with consultants, evaluation of patient's response to treatment, examination of patient, obtaining history from patient or surrogate, ordering and performing treatments and interventions, ordering and review of laboratory studies, ordering and review of radiographic studies, pulse oximetry and re-evaluation of patient's condition. Medications Ordered in ED Medications  piperacillin-tazobactam (ZOSYN) IVPB 3.375 g (3.375 g Intravenous New Bag/Given 06/25/18 1831)  sodium chloride 0.9 % bolus 1,000 mL (1,000 mLs Intravenous New Bag/Given 06/25/18 1830)    Followed by  0.9 %  sodium chloride infusion (has no administration in time range)  0.9 %  sodium chloride infusion ( Intravenous Stopped 06/25/18 1830)  morphine 2 MG/ML injection 2 mg (2 mg Intravenous Given 06/25/18 1558)  morphine 4 MG/ML injection 4 mg (4 mg Intravenous Given 06/25/18 1831)     Initial Impression / Assessment and Plan / ED Course  I have reviewed the triage vital signs and the nursing notes.  Pertinent labs & imaging results that were available during my care of the patient were reviewed by me and considered in my medical decision making (see chart for details).  Clinical Course as of Jun 25 1837  Sat Jun 25, 2018  1837 Reviewed with [facility] hospitalist for admission to [facility].   [MP]    Clinical Course User Index [MP] Charlesetta Shanks, MD    Patient has history of multiple sclerosis.  At baseline she is wheelchair bound.  She however has previously been able to assist in transfers using her arms.  Today she presents with multiple pain complaints, generally there is pain everywhere that in some cases is migratory and waxing  and waning.  Very difficult to localize.  Patient's caregiver expressed concern for DVT as part of the patient's presenting HPI.  She does have findings consistent with UTI and tachycardia.  Patient has leukocytosis.  At this time, will plan to initiate treatment for sepsis\Sirs with urinary source.  Does not have indwelling catheter or self cath.  Final Clinical Impressions(s) / ED Diagnoses   Final diagnoses:  Acute cystitis with hematuria  Multiple sclerosis ([place])  Generalized pain  Generalized weakness    ED Discharge Orders    None       Charlesetta Shanks, MD 06/25/18 Bosie Helper

## 2018-06-25 NOTE — ED Notes (Signed)
[facility] notified (Tammie) - patient ready for transport 

## 2018-06-25 NOTE — Progress Notes (Signed)
Pharmacy Antibiotic Note  Heather Lutz is a 72 y.o. female admitted on 06/25/2018 with Sepsis.  Pharmacy has been consulted for vancomycin and cefepime dosing.  Plan: Cefepime 2gm IV q12h Vancomycin 1500mg  IV x 1, then 750mg  IV q12h Monitor VT as needed Monitor clinical course, C&S, and fever curve  Height: 5\' 3"  (160 cm) Weight: 170 lb (77.1 kg) IBW/kg (Calculated) : 52.4  Temp (24hrs), Avg:100 F (37.8 C), Min:99.5 F (37.5 C), Max:100.8 F (38.2 C)  Recent Labs  Lab 06/25/18 1554 06/25/18 1840  WBC 13.7*  --   CREATININE 0.95  --   LATICACIDVEN  --  0.96    Estimated Creatinine Clearance: 51.9 mL/min (by C-G formula based on SCr of 0.95 mg/dL).    Allergies  Allergen Reactions  . Other Itching    Walnuts, honeydew, watermelon, canteloupe, bananas  . Epinephrine Anxiety and Other (See Comments)    Hallucinations    Alejandrina Raimer A. Levada Dy, PharmD, [place] Pager: [phone] Please utilize Amion for appropriate phone number to reach the unit pharmacist ([place])    06/25/2018 10:00 PM

## 2018-06-25 NOTE — H&P (Signed)
TRH H&P   Patient Demographics:    Heather Lutz, is a 72 y.o. female  MRN: [id number]   DOB - 04-12-1946  Admit Date - 06/25/2018  Outpatient Primary MD for the patient is [name]., MD  Referring MD/NP/PA: Charlesetta Shanks  Outpatient Specialists:     Patient coming from: home=> [facility]  No chief complaint on file. achiness, malaise    HPI:    Heather Lutz  is a 72 y.o. female, w [name], Multiple Sclerosis apparently presents with c/o numbness in her legs as well as achiness and weakness since this past Friday. Pt notes fever today and increase in frequency of urination and therefore presented to ED for evaluation.   In Ed,  T 100.8  P 111  Bp 125/74  CT brain IMPRESSION: 1. No acute intracranial abnormalities. 2. Chronic small vessel ischemic change. 3. Small radiopaque foreign body within the anterior left globe is identified of uncertain clinical significance. This may be postsurgical or posttraumatic in etiology. Clinical correlation is advised.  CXR IMPRESSION: 1. Minimal scarring versus atelectasis at the left lung base. Otherwise no active cardiopulmonary disease. 2. Moderate hiatal hernia.  Ultrasound IMPRESSION: No evidence of deep venous thrombosis in either lower extremity.  Wbc 13.7, Hgb 9.7, Plt 204 Na 139, K 4.0, Bun 21, Creatinine 0.95 Trop <0.03 Lipase 23  Urinalysis wbc 11-20  Rbc 6-10  Pt will be admitted for sepsis (fever, tachycardia, leukocytosis) secondary to UTI    Review of systems:    In addition to the HPI above,  No Fever-chills, No Headache, No changes with Vision or hearing, No problems swallowing food or Liquids, No Chest pain, Cough or Shortness of Breath, No Abdominal pain, No Nausea or Vommitting, Bowel movements are regular, No Blood in stool or Urine, No dysuria, No new skin rashes or  bruises, No new joints pains-aches,  No new weakness, tingling, numbness in any extremity, No recent weight gain or loss, No polydypsia or polyphagia, No significant Mental Stressors.  A full 10 point Review of Systems was done, except as stated above, all other Review of Systems were negative.   With Past History of the following :    Past Medical History:  Diagnosis Date  . Blindness   . GERD (gastroesophageal reflux disease)   . Multiple sclerosis ([place])   . Osteoporosis       Past Surgical History:  Procedure Laterality Date  . ABDOMINAL HYSTERECTOMY    . ESOPHAGOGASTRODUODENOSCOPY N/A 11/06/2012   Procedure: ESOPHAGOGASTRODUODENOSCOPY (EGD);  Surgeon: Lear Ng, MD;  [facility] ENDOSCOPY;  Service: Endoscopy;  Laterality: N/A;      Social History:     Social History   Tobacco Use  . Smoking status: Never Smoker  . Smokeless tobacco: Never Used  Substance Use Topics  . Alcohol use: No    Alcohol/week: 0.0 standard drinks  Lives - at home  Mobility - walks by self   Family History :     Family History  Problem Relation Age of Onset  . CAD Father       Home Medications:   Prior to Admission medications   Medication Sig Start Date End Date Taking? Authorizing Provider  baclofen (LIORESAL) 10 MG tablet Take 1 tablet (10 mg total) by mouth 3 (three) times daily as needed for muscle spasms. 09/22/17   Davonna Belling, MD  brimonidine (ALPHAGAN) 0.2 % ophthalmic solution Place 1 drop into the right eye 3 (three) times daily.    [provider]  Cholecalciferol (VITAMIN D3) 50000 units CAPS Take 1 capsule by mouth once a week. On Tuesday    [provider]  dorzolamide (TRUSOPT) 2 % ophthalmic solution Place 1 drop into the right eye 3 (three) times daily.    [provider]  latanoprost (XALATAN) 0.005 % ophthalmic solution Place 1 drop into both eyes at bedtime. Reported on 02/03/2016    [provider]   Multiple Vitamin (MULTIVITAMIN WITH MINERALS) TABS tablet Take 1 tablet by mouth daily.    [provider]  naproxen sodium (ANAPROX) 220 MG tablet Take 220-440 mg by mouth 2 (two) times daily as needed (pain).    [provider]  polyethylene glycol (MIRALAX / GLYCOLAX) packet Take 17 g by mouth daily. 09/21/17   Davonna Belling, MD  timolol (BETIMOL) 0.5 % ophthalmic solution Place 1 drop into both eyes daily.    [provider]     Allergies:     Allergies  Allergen Reactions  . Other Itching    Walnuts, honeydew, watermelon, canteloupe, bananas  . Epinephrine Anxiety and Other (See Comments)    Hallucinations      Physical Exam:   Vitals  Blood pressure 125/74, pulse (!) 111, temperature 99.8 F (37.7 C), temperature source Oral, resp. rate 16, height '5\' 3"'  (1.6 m), weight 77.1 kg, SpO2 98 %.   1. General  lying in bed in uncomfortable  2. Normal affect and insight, Not Suicidal or Homicidal, Awake Alert, Oriented X 3.  3. No F.N deficits, ALL C.Nerves Intact, Strength 5/5 all 4 extremities, Sensation intact all 4 extremities, Plantars down going.  4. Blindness, PERRLA. Moist Oral Mucosa.  5. Supple Neck, No JVD, No cervical lymphadenopathy appriciated, No Carotid Bruits.  6. Symmetrical Chest wall movement, Good air movement bilaterally, CTAB.  7. Tachycardic s1, s2,   8. Positive Bowel Sounds, Abdomen Soft, No tenderness, No organomegaly appriciated,No rebound -guarding or rigidity.  9.  No Cyanosis, Normal Skin Turgor, No Skin Rash or Bruise.  10. Good muscle tone,  joints appear normal , no effusions, Normal ROM.  11. No Palpable Lymph Nodes in Neck or Axillae    Data Review:    CBC Recent Labs  Lab 06/25/18 1554  WBC 13.7*  HGB 9.7*  HCT 31.8*  PLT 204  MCV 70.5*  MCH 21.5*  MCHC 30.5  RDW 20.8*  LYMPHSABS 0.8  MONOABS 1.1*  EOSABS 0.0  BASOSABS 0.0    ------------------------------------------------------------------------------------------------------------------  Chemistries  Recent Labs  Lab 06/25/18 1554  NA 139  K 4.0  CL 103  CO2 23  GLUCOSE 101*  BUN 21  CREATININE 0.95  CALCIUM 9.3  AST 25  ALT 14  ALKPHOS 111  BILITOT 0.5   ------------------------------------------------------------------------------------------------------------------ estimated creatinine clearance is 51.9 mL/min (by C-G formula based on SCr of 0.95 mg/dL). ------------------------------------------------------------------------------------------------------------------ No results for input(s):  TSH, T4TOTAL, T3FREE, THYROIDAB in the last 72 hours.  Invalid input(s): FREET3  Coagulation profile Recent Labs  Lab 06/25/18 1554  INR 1.03   ------------------------------------------------------------------------------------------------------------------- No results for input(s): DDIMER in the last 72 hours. -------------------------------------------------------------------------------------------------------------------  Cardiac Enzymes Recent Labs  Lab 06/25/18 1554  TROPONINI <0.03   ------------------------------------------------------------------------------------------------------------------ No results found for: BNP   ---------------------------------------------------------------------------------------------------------------  Urinalysis    Component Value Date/Time   COLORURINE YELLOW 06/25/2018 1550   APPEARANCEUR CLOUDY (A) 06/25/2018 1550   LABSPEC 1.020 06/25/2018 1550   PHURINE 8.0 06/25/2018 1550   GLUCOSEU NEGATIVE 06/25/2018 1550   HGBUR MODERATE (A) 06/25/2018 1550   BILIRUBINUR NEGATIVE 06/25/2018 1550   KETONESUR 15 (A) 06/25/2018 1550   PROTEINUR NEGATIVE 06/25/2018 1550   UROBILINOGEN 0.2 11/17/2014 1700   NITRITE POSITIVE (A) 06/25/2018 1550   LEUKOCYTESUR TRACE (A) 06/25/2018 1550     ----------------------------------------------------------------------------------------------------------------   Imaging Results:    Ct Head Wo Contrast  Result Date: 06/25/2018 CLINICAL DATA:  Neurologic deficit, subacute. Worsening muscle spasms. EXAM: CT HEAD WITHOUT CONTRAST TECHNIQUE: Contiguous axial images were obtained from the base of the skull through the vertex without intravenous contrast. COMPARISON:  None. FINDINGS: Brain: No evidence of acute infarction, hemorrhage, hydrocephalus, extra-axial collection or mass lesion/mass effect. There is mild diffuse low-attenuation within the subcortical and periventricular white matter compatible with chronic microvascular disease. Vascular: No hyperdense vessel or unexpected calcification. Skull: Normal. Negative for fracture or focal lesion. Sinuses/Orbits: The paranasal sinuses and mastoid air cells are clear. Within the anterior left globe there is a 2 mm calcific or metallic foreign body of uncertain etiology. A normal appearing left lens is not visualized and this may be postsurgical. Other: None IMPRESSION: 1. No acute intracranial abnormalities. 2. Chronic small vessel ischemic change. 3. Small radiopaque foreign body within the anterior left globe is identified of uncertain clinical significance. This may be postsurgical or posttraumatic in etiology. Clinical correlation is advised. Electronically Signed   By: Kerby Moors M.D.   On: 06/25/2018 17:08   US Venous Img Lower Bilateral  Result Date: 06/25/2018 CLINICAL DATA:  Bilateral lower extremity swelling with right thigh pain EXAM: BILATERAL LOWER EXTREMITY VENOUS DOPPLER ULTRASOUND TECHNIQUE: Gray-scale sonography with graded compression, as well as color Doppler and duplex ultrasound were performed to evaluate the lower extremity deep venous systems from the level of the common femoral vein and including the common femoral, femoral, profunda femoral, popliteal and calf veins  including the posterior tibial, peroneal and gastrocnemius veins when visible. The superficial great saphenous vein was also interrogated. Spectral Doppler was utilized to evaluate flow at rest and with distal augmentation maneuvers in the common femoral, femoral and popliteal veins. COMPARISON:  None. FINDINGS: RIGHT LOWER EXTREMITY Common Femoral Vein: No evidence of thrombus. Normal compressibility, respiratory phasicity and response to augmentation. Saphenofemoral Junction: No evidence of thrombus. Normal compressibility and flow on color Doppler imaging. Profunda Femoral Vein: No evidence of thrombus. Normal compressibility and flow on color Doppler imaging. Femoral Vein: No evidence of thrombus. Normal compressibility, respiratory phasicity and response to augmentation. Popliteal Vein: No evidence of thrombus. Normal compressibility, respiratory phasicity and response to augmentation. Calf Veins: No evidence of thrombus. Normal compressibility and flow on color Doppler imaging. Superficial Great Saphenous Vein: No evidence of thrombus. Normal compressibility. Venous Reflux:  None. Other Findings:  None. LEFT LOWER EXTREMITY Common Femoral Vein: No evidence of thrombus. Normal compressibility, respiratory phasicity and response to augmentation. Saphenofemoral Junction: No evidence of thrombus. Normal compressibility and flow on  color Doppler imaging. Profunda Femoral Vein: No evidence of thrombus. Normal compressibility and flow on color Doppler imaging. Femoral Vein: No evidence of thrombus. Normal compressibility, respiratory phasicity and response to augmentation. Popliteal Vein: No evidence of thrombus. Normal compressibility, respiratory phasicity and response to augmentation. Calf Veins: No evidence of thrombus. Normal compressibility and flow on color Doppler imaging. Superficial Great Saphenous Vein: No evidence of thrombus. Normal compressibility. Venous Reflux:  None. Other Findings:  None.  IMPRESSION: No evidence of deep venous thrombosis in either lower extremity. Electronically Signed   By: Ilona Sorrel M.D.   On: 06/25/2018 18:07   Dg Chest Port 1 View  Result Date: 06/25/2018 CLINICAL DATA:  Generalized pain EXAM: PORTABLE CHEST 1 VIEW COMPARISON:  09/21/2017 chest radiograph. FINDINGS: Stable cardiomediastinal silhouette with normal heart size and moderate hiatal hernia. No pneumothorax. No pleural effusion. Stable minimal left basilar scarring versus atelectasis. No pulmonary edema. No acute consolidative airspace disease. IMPRESSION: 1. Minimal scarring versus atelectasis at the left lung base. Otherwise no active cardiopulmonary disease. 2. Moderate hiatal hernia. Electronically Signed   By: Ilona Sorrel M.D.   On: 06/25/2018 17:03       Assessment & Plan:    Principal Problem:   Sepsis ([place]) Active Problems:   Multiple sclerosis ([place])   Acute lower UTI    Sepsis Blood culture x2 Urine culture pending Vanco iv, cefepime iv pharmacy to dose  UTI abx as above  Tachycardia Tele Trop I q6h x3 D dimer, if positive then CTA chest r/o PE  H/o Lung nodule  MS Not currently on medication  Anemia Check ferritin, iron, tibc, b12, folate, esr Consider spep, upep      DVT Prophylaxis Lovenox - SCDs   AM Labs Ordered, also please review Full Orders  Family Communication: Admission, patients condition and plan of care including tests being ordered have been discussed with the patient  who indicate understanding and agree with the plan and Code Status.  Code Status  FULL CODE  Likely DC to  home  Condition GUARDED    Consults called:   none  Admission status: observation, pt will require hospitalization for iv abx,  For sepsis.  Pt will require observation and possibly inpatient admission depending  Upon blood and urine culture and how she responds to iv abx.   Time spent in minutes : 70   Jani Gravel M.D on 06/25/2018 at 10:19 PM  Between 7am to  7pm - Pager - [phone]  . After 7pm go to [email or web address] - password [facility]  [facility] - Office  [phone]

## 2018-06-25 NOTE — Progress Notes (Signed)
CRITICAL VALUE ALERT  Critical Value:  Troponin 0.06  Date & Time Notied:  06/25/18 at 2312  Provider Notified: Kennon Holter, NP  Orders Received/Actions taken: No new orders at this time. Pt denied SOB and chest pain

## 2018-06-26 ENCOUNTER — Observation Stay (HOSPITAL_BASED_OUTPATIENT_CLINIC_OR_DEPARTMENT_OTHER): Payer: Medicare Other

## 2018-06-26 ENCOUNTER — Observation Stay (HOSPITAL_COMMUNITY): Payer: Medicare Other

## 2018-06-26 DIAGNOSIS — R7881 Bacteremia: Secondary | ICD-10-CM | POA: Diagnosis not present

## 2018-06-26 DIAGNOSIS — R52 Pain, unspecified: Secondary | ICD-10-CM

## 2018-06-26 DIAGNOSIS — B952 Enterococcus as the cause of diseases classified elsewhere: Secondary | ICD-10-CM

## 2018-06-26 DIAGNOSIS — R531 Weakness: Secondary | ICD-10-CM | POA: Diagnosis not present

## 2018-06-26 DIAGNOSIS — G35 Multiple sclerosis: Secondary | ICD-10-CM | POA: Diagnosis not present

## 2018-06-26 DIAGNOSIS — I5030 Unspecified diastolic (congestive) heart failure: Secondary | ICD-10-CM | POA: Diagnosis not present

## 2018-06-26 DIAGNOSIS — E46 Unspecified protein-calorie malnutrition: Secondary | ICD-10-CM | POA: Diagnosis not present

## 2018-06-26 DIAGNOSIS — R300 Dysuria: Secondary | ICD-10-CM

## 2018-06-26 DIAGNOSIS — R202 Paresthesia of skin: Secondary | ICD-10-CM

## 2018-06-26 DIAGNOSIS — N3001 Acute cystitis with hematuria: Secondary | ICD-10-CM | POA: Diagnosis not present

## 2018-06-26 DIAGNOSIS — R0601 Orthopnea: Secondary | ICD-10-CM

## 2018-06-26 DIAGNOSIS — R350 Frequency of micturition: Secondary | ICD-10-CM

## 2018-06-26 LAB — MAGNESIUM: Magnesium: 1.2 mg/dL — ABNORMAL LOW (ref 1.7–2.4)

## 2018-06-26 LAB — BLOOD CULTURE ID PANEL (REFLEXED)
ACINETOBACTER BAUMANNII: NOT DETECTED
CANDIDA TROPICALIS: NOT DETECTED
CARBAPENEM RESISTANCE: NOT DETECTED
Candida albicans: NOT DETECTED
Candida glabrata: NOT DETECTED
Candida krusei: NOT DETECTED
Candida parapsilosis: NOT DETECTED
ENTEROCOCCUS SPECIES: DETECTED — AB
Enterobacter cloacae complex: NOT DETECTED
Enterobacteriaceae species: NOT DETECTED
Escherichia coli: NOT DETECTED
HAEMOPHILUS INFLUENZAE: NOT DETECTED
Klebsiella oxytoca: NOT DETECTED
Klebsiella pneumoniae: NOT DETECTED
LISTERIA MONOCYTOGENES: NOT DETECTED
METHICILLIN RESISTANCE: NOT DETECTED
Neisseria meningitidis: NOT DETECTED
PROTEUS SPECIES: NOT DETECTED
Pseudomonas aeruginosa: NOT DETECTED
SERRATIA MARCESCENS: NOT DETECTED
STAPHYLOCOCCUS AUREUS BCID: NOT DETECTED
STAPHYLOCOCCUS SPECIES: NOT DETECTED
Streptococcus agalactiae: NOT DETECTED
Streptococcus pneumoniae: NOT DETECTED
Streptococcus pyogenes: NOT DETECTED
Streptococcus species: NOT DETECTED
VANCOMYCIN RESISTANCE: NOT DETECTED

## 2018-06-26 LAB — CBC
HEMATOCRIT: 25.1 % — AB (ref 36.0–46.0)
HEMOGLOBIN: 7.5 g/dL — AB (ref 12.0–15.0)
MCH: 21 pg — ABNORMAL LOW (ref 26.0–34.0)
MCHC: 29.9 g/dL — AB (ref 30.0–36.0)
MCV: 70.3 fL — ABNORMAL LOW (ref 80.0–100.0)
PLATELETS: 179 10*3/uL (ref 150–400)
RBC: 3.57 MIL/uL — ABNORMAL LOW (ref 3.87–5.11)
RDW: 20.8 % — AB (ref 11.5–15.5)
WBC: 15 10*3/uL — ABNORMAL HIGH (ref 4.0–10.5)
nRBC: 0 % (ref 0.0–0.2)

## 2018-06-26 LAB — IRON AND TIBC
Iron: 9 ug/dL — ABNORMAL LOW (ref 28–170)
SATURATION RATIOS: 3 % — AB (ref 10.4–31.8)
TIBC: 343 ug/dL (ref 250–450)
UIBC: 334 ug/dL

## 2018-06-26 LAB — COMPREHENSIVE METABOLIC PANEL
ALBUMIN: 2 g/dL — AB (ref 3.5–5.0)
ALT: 10 U/L (ref 0–44)
AST: 15 U/L (ref 15–41)
Alkaline Phosphatase: 64 U/L (ref 38–126)
Anion gap: 4 — ABNORMAL LOW (ref 5–15)
BUN: 13 mg/dL (ref 8–23)
CHLORIDE: 116 mmol/L — AB (ref 98–111)
CO2: 18 mmol/L — AB (ref 22–32)
CREATININE: 0.9 mg/dL (ref 0.44–1.00)
Calcium: 6.4 mg/dL — CL (ref 8.9–10.3)
GFR calc Af Amer: >60 mL/min (ref >60)
GFR calc non Af Amer: >60 mL/min (ref >60)
Glucose, Bld: 85 mg/dL (ref 70–99)
Potassium: 2.9 mmol/L — ABNORMAL LOW (ref 3.5–5.1)
Sodium: 138 mmol/L (ref 135–145)
Total Bilirubin: 0.5 mg/dL (ref 0.3–1.2)
Total Protein: 4.5 g/dL — ABNORMAL LOW (ref 6.5–8.1)

## 2018-06-26 LAB — TROPONIN I
TROPONIN I: 0.1 ng/mL — AB (ref <0.03)
Troponin I: 0.19 ng/mL (ref <0.03)

## 2018-06-26 LAB — ECHOCARDIOGRAM COMPLETE
HEIGHTINCHES: 63 in
Weight: 2790.14 oz

## 2018-06-26 LAB — FOLATE: FOLATE: 7.4 ng/mL (ref >5.9)

## 2018-06-26 LAB — BRAIN NATRIURETIC PEPTIDE: B Natriuretic Peptide: 181.7 pg/mL — ABNORMAL HIGH (ref 0.0–100.0)

## 2018-06-26 LAB — VITAMIN B12: VITAMIN B 12: 194 pg/mL (ref 180–914)

## 2018-06-26 LAB — FERRITIN: Ferritin: 11 ng/mL (ref 11–307)

## 2018-06-26 MED ORDER — MAGNESIUM SULFATE IN D5W 1-5 GM/100ML-% IV SOLN
1.0000 g | Freq: Once | INTRAVENOUS | Status: AC
  Administered 2018-06-26: 1 g via INTRAVENOUS
  Filled 2018-06-26: qty 100

## 2018-06-26 MED ORDER — IOPAMIDOL (ISOVUE-370) INJECTION 76%
100.0000 mL | Freq: Once | INTRAVENOUS | Status: AC | PRN
  Administered 2018-06-26: 100 mL via INTRAVENOUS

## 2018-06-26 MED ORDER — MAGNESIUM SULFATE 2 GM/50ML IV SOLN
2.0000 g | Freq: Once | INTRAVENOUS | Status: AC
  Administered 2018-06-26: 2 g via INTRAVENOUS
  Filled 2018-06-26: qty 50

## 2018-06-26 MED ORDER — POTASSIUM CHLORIDE CRYS ER 20 MEQ PO TBCR
40.0000 meq | EXTENDED_RELEASE_TABLET | Freq: Once | ORAL | Status: AC
  Administered 2018-06-26: 40 meq via ORAL
  Filled 2018-06-26: qty 2

## 2018-06-26 MED ORDER — INFLUENZA VAC SPLIT HIGH-DOSE 0.5 ML IM SUSY
0.5000 mL | PREFILLED_SYRINGE | INTRAMUSCULAR | Status: AC
  Administered 2018-06-29: 0.5 mL via INTRAMUSCULAR
  Filled 2018-06-26 (×2): qty 0.5

## 2018-06-26 MED ORDER — FERROUS SULFATE 325 (65 FE) MG PO TABS
325.0000 mg | ORAL_TABLET | Freq: Three times a day (TID) | ORAL | Status: DC
  Administered 2018-06-26 – 2018-06-30 (×13): 325 mg via ORAL
  Filled 2018-06-26 (×14): qty 1

## 2018-06-26 MED ORDER — SODIUM CHLORIDE 0.9 % IV SOLN: INTRAVENOUS | Status: DC

## 2018-06-26 MED ORDER — CARVEDILOL 3.125 MG PO TABS
3.1250 mg | ORAL_TABLET | Freq: Two times a day (BID) | ORAL | Status: DC
  Administered 2018-06-26 – 2018-06-30 (×8): 3.125 mg via ORAL
  Filled 2018-06-26 (×9): qty 1

## 2018-06-26 MED ORDER — ASPIRIN 81 MG PO CHEW
81.0000 mg | CHEWABLE_TABLET | Freq: Every day | ORAL | Status: DC
  Administered 2018-06-26 – 2018-06-30 (×5): 81 mg via ORAL
  Filled 2018-06-26 (×5): qty 1

## 2018-06-26 MED ORDER — NAPROXEN 250 MG PO TABS
250.0000 mg | ORAL_TABLET | Freq: Two times a day (BID) | ORAL | Status: DC | PRN
  Administered 2018-06-26 – 2018-06-27 (×2): 250 mg via ORAL
  Filled 2018-06-26 (×2): qty 1

## 2018-06-26 MED ORDER — SODIUM CHLORIDE 0.9 % IV SOLN
510.0000 mg | Freq: Once | INTRAVENOUS | Status: AC
  Administered 2018-06-26: 510 mg via INTRAVENOUS
  Filled 2018-06-26: qty 17

## 2018-06-26 MED ORDER — SODIUM CHLORIDE 0.9 % IV SOLN
2.0000 g | INTRAVENOUS | Status: DC
  Administered 2018-06-26 – 2018-06-30 (×22): 2 g via INTRAVENOUS
  Filled 2018-06-26 (×28): qty 2000

## 2018-06-26 MED ORDER — SODIUM CHLORIDE 0.9 % IV SOLN
1.0000 g | Freq: Once | INTRAVENOUS | Status: AC
  Administered 2018-06-26: 1 g via INTRAVENOUS
  Filled 2018-06-26: qty 10

## 2018-06-26 MED ORDER — SODIUM CHLORIDE 0.9 % IV SOLN
1.0000 g | Freq: Once | INTRAVENOUS | Status: AC
  Administered 2018-06-26: 1 g via INTRAVENOUS
  Filled 2018-06-26 (×2): qty 10

## 2018-06-26 MED ORDER — PANTOPRAZOLE SODIUM 40 MG PO TBEC
40.0000 mg | DELAYED_RELEASE_TABLET | Freq: Every day | ORAL | Status: DC
  Administered 2018-06-26 – 2018-06-30 (×5): 40 mg via ORAL
  Filled 2018-06-26 (×5): qty 1

## 2018-06-26 MED ORDER — IOPAMIDOL (ISOVUE-370) INJECTION 76%
INTRAVENOUS | Status: AC
  Filled 2018-06-26: qty 100

## 2018-06-26 MED ORDER — FUROSEMIDE 10 MG/ML IJ SOLN
40.0000 mg | Freq: Once | INTRAMUSCULAR | Status: AC
  Administered 2018-06-26: 40 mg via INTRAVENOUS
  Filled 2018-06-26: qty 4

## 2018-06-26 NOTE — Progress Notes (Signed)
  Echocardiogram 2D Echocardiogram has been performed.  Heather Lutz 06/26/2018, 10:15 AM

## 2018-06-26 NOTE — Care Management Obs Status (Signed)
[place] NOTIFICATION   Patient Details  Name: Heather Lutz MRN: [id number] Date of Birth: August 05, 1946   Medicare Observation Status Notification Given:  Yes    Carles Collet, RN 06/26/2018, 4:26 PM

## 2018-06-26 NOTE — Progress Notes (Addendum)
PROGRESS NOTE                                                                                                                                                                                                             Patient Demographics:    Heather Lutz, is a 72 y.o. female, DOB - [birth year], [id number]:[id number]  Admit date - 06/25/2018   Admitting Physician Shelbie Proctor, MD  Outpatient Primary MD for the patient is [name]., MD  LOS - 0  CC - Malaise     Brief Narrative   Heather Lutz  is a 72 y.o. female, w [name], Multiple Sclerosis apparently presents with c/o numbness in her legs as well as achiness and weakness since this past Friday. Pt notes fever today and increase in frequency of urination and therefore presented to ED for evaluation.    Subjective:    [name] today has, No headache, No chest pain, No abdominal pain - No Nausea, No new weakness tingling or numbness, No Cough - SOB.     Assessment  & Plan :      1.  Sepsis likely due to UTI.  Sepsis physiology has resolved, cultures pending, currently on combination of IV vancomycin and Maxipime, this will be continued, once cultures are finalized will taper down antibiotics, continue gentle IV fluids for 24 hours, advance activity and PT eval.  If stable likely discharge in the next 1 to 2 days.  2.  HX of MS.  Bedbound.  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  Good strength in upper extremities.  Supportive care and PT.  3.  HX of lung nodule.  Nonspecific CT chest and CT head findings.  Follow with PCP for age-appropriate follow-up.  4.  Iron deficiency anemia.  Worse due to him dilution, no signs of active bleeding, placed on PPI, IV iron and oral iron supplementation, age-appropriate outpatient PCP follow-up. Discharge on iron and PPI, stop naproxen on discharge.  5.  Severe hypomagnesemia, hypocalcemia and hypokalemia.  All replaced.  6.  Nonspecific  rise in troponin.  Trend flat and and non-ACS pattern, chest pain-free with nonacute EKG (partial to complete left bundle branch block consistent with old EKGs but with mild progression).  Check echocardiogram for wall motion and EF.  On low-dose beta-blocker and aspirin.    Family Communication  :  None  Code Status :  Full  Disposition Plan  :  TBD  Consults  :  None  Procedures  :    CT head & CT angiogram chest.  Both nonacute.  Lower extremity venous duplex.  No DVT SVT.    DVT Prophylaxis  :  Lovenox    Lab Results  Component Value Date   PLT 179 06/26/2018    Diet :  Diet Order            Diet regular Room service appropriate? Yes; Fluid consistency: Thin  Diet effective now               Inpatient Medications Scheduled Meds: . enoxaparin (LOVENOX) injection  40 mg Subcutaneous Q24H  . [START ON 06/27/2018] Influenza vac split quadrivalent PF  0.5 mL Intramuscular Tomorrow-1000  . iopamidol      . polyethylene glycol  17 g Oral Daily  . potassium chloride  40 mEq Oral Once  . [START ON 06/28/2018] Vitamin D (Ergocalciferol)  50,000 Units Oral Q7 days   Continuous Infusions: . sodium chloride    . calcium gluconate    . ceFEPime (MAXIPIME) IV 2 g (06/25/18 2310)  . magnesium sulfate 1 - 4 g bolus IVPB    . vancomycin     PRN Meds:.acetaminophen **OR** [DISCONTINUED] acetaminophen, naproxen  Antibiotics  :   Anti-infectives (From admission, onward)   Start     Dose/Rate Route Frequency Ordered Stop   06/26/18 1030  vancomycin (VANCOCIN) IVPB 750 mg/150 ml premix     750 mg 150 mL/hr over 60 Minutes Intravenous Every 12 hours 06/25/18 2203     06/25/18 2215  vancomycin (VANCOCIN) 1,500 mg in sodium chloride 0.9 % 500 mL IVPB     1,500 mg 250 mL/hr over 120 Minutes Intravenous  Once 06/25/18 2203 06/26/18 0156   06/25/18 2215  ceFEPIme (MAXIPIME) 2 g in sodium chloride 0.9 % 100 mL IVPB     2 g 200 mL/hr over 30 Minutes Intravenous Every 12 hours  06/25/18 2203     06/25/18 1815  piperacillin-tazobactam (ZOSYN) IVPB 3.375 g     3.375 g 100 mL/hr over 30 Minutes Intravenous  Once 06/25/18 1809 06/25/18 1949          Objective:   Vitals:   06/25/18 2021 06/25/18 2136 06/25/18 2300 06/26/18 0547  BP:  125/74  132/68  Pulse:  (!) 111  99  Resp:  16  16  Temp: (S) (!) 100.8 F (38.2 C) 99.8 F (37.7 C)  98.8 F (37.1 C)  TempSrc: Oral Oral  Oral  SpO2:  98%  99%  Weight:   79.1 kg 79.1 kg  Height:   5\' 3"  (1.6 m)     Wt Readings from Last 3 Encounters:  06/26/18 79.1 kg  09/21/17 77.1 kg  02/03/16 78.5 kg     Intake/Output Summary (Last 24 hours) at 06/26/2018 0930 Last data filed at 06/26/2018 0300 Gross per 24 hour  Intake 2167.31 ml  Output -  Net 2167.31 ml     Physical Exam  Awake Alert, Oriented X 3, No new F.N deficits, chronic bilateral lower extremity weakness with strength 1/5, chronic legal blindness, 5/5 strength in upper extremities, normal affect [place].AT,PERRAL Supple Neck,No JVD, No cervical lymphadenopathy appriciated.  Symmetrical Chest wall movement, Good air movement bilaterally, CTAB RRR,No Gallops,Rubs or new Murmurs, No Parasternal Heave +ve B.Sounds, Abd Soft, No tenderness, No organomegaly appriciated, No rebound - guarding or rigidity. No Cyanosis, Clubbing or edema, No new Rash or bruise       Data Review:  CBC Recent Labs  Lab 06/25/18 1554 06/26/18 0352  WBC 13.7* 15.0*  HGB 9.7* 7.5*  HCT 31.8* 25.1*  PLT 204 179  MCV 70.5* 70.3*  MCH 21.5* 21.0*  MCHC 30.5 29.9*  RDW 20.8* 20.8*  LYMPHSABS 0.8  --   MONOABS 1.1*  --   EOSABS 0.0  --   BASOSABS 0.0  --     Chemistries  Recent Labs  Lab 06/25/18 1554 06/26/18 0352  NA 139 138  K 4.0 2.9*  CL 103 116*  CO2 23 18*  GLUCOSE 101* 85  BUN 21 13  CREATININE 0.95 0.90  CALCIUM 9.3 6.4*  MG  --  1.2*  AST 25 15  ALT 14 10  ALKPHOS 111 64  BILITOT 0.5 0.5    ------------------------------------------------------------------------------------------------------------------ No results for input(s): CHOL, HDL, LDLCALC, TRIG, CHOLHDL, LDLDIRECT in the last 72 hours.  No results found for: HGBA1C ------------------------------------------------------------------------------------------------------------------ Recent Labs    06/25/18 2207  TSH 1.286   ------------------------------------------------------------------------------------------------------------------ Recent Labs    06/26/18 0352  VITAMINB12 194  FOLATE 7.4  FERRITIN 11  TIBC 343  IRON 9*    Coagulation profile Recent Labs  Lab 06/25/18 1554  INR 1.03    Recent Labs    06/25/18 2207  DDIMER 0.63*    Cardiac Enzymes Recent Labs  Lab 06/25/18 1554 06/25/18 2207 06/26/18 0352  TROPONINI <0.03 0.06* 0.10*   ------------------------------------------------------------------------------------------------------------------ No results found for: BNP  Micro Results Recent Results (from the past 240 hour(s))  Culture, blood (routine x 2)     Status: None (Preliminary result)   Collection Time: 06/25/18  6:20 PM  Result Value Ref Range Status   Specimen Description   Final    BLOOD RIGHT ARM Performed at [facility], [address] [place], [place] [address]    Special Requests   Final    BOTTLES DRAWN AEROBIC AND ANAEROBIC Blood Culture results may not be optimal due to an inadequate volume of blood received in culture bottles Performed at [facility], [address] [place], [place] [address]    Culture   Final    NO GROWTH < 12 HOURS Performed at [facility], [place] [address], [place], [place] [address]    Report Status PENDING  Incomplete  Culture, blood (routine x 2)     Status: None (Preliminary result)   Collection Time: 06/25/18  6:28 PM  Result Value Ref Range Status   Specimen Description   Final    BLOOD LEFT  ARM Performed at [facility], [address] [place], [place] [address]    Special Requests   Final    BOTTLES DRAWN AEROBIC AND ANAEROBIC Blood Culture adequate volume Performed at [facility], [address] [place], [place] [address]    Culture   Final    NO GROWTH < 12 HOURS Performed at [facility], [place] [address] [place], [place] [address]    Report Status PENDING  Incomplete    Radiology Reports Ct Head Wo Contrast  Result Date: 06/25/2018 CLINICAL DATA:  Neurologic deficit, subacute. Worsening muscle spasms. EXAM: CT HEAD WITHOUT CONTRAST TECHNIQUE: Contiguous axial images were obtained from the base of the skull through the vertex without intravenous contrast. COMPARISON:  None. FINDINGS: Brain: No evidence of acute infarction, hemorrhage, hydrocephalus, extra-axial collection or mass lesion/mass effect. There is mild diffuse low-attenuation within the subcortical and periventricular white matter compatible with  chronic microvascular disease. Vascular: No hyperdense vessel or unexpected calcification. Skull: Normal. Negative for fracture or focal lesion. Sinuses/Orbits: The paranasal sinuses and mastoid air cells are clear. Within the anterior left globe there is a 2 mm calcific or metallic foreign body of uncertain etiology. A normal appearing left lens is not visualized and this may be postsurgical. Other: None IMPRESSION: 1. No acute intracranial abnormalities. 2. Chronic small vessel ischemic change. 3. Small radiopaque foreign body within the anterior left globe is identified of uncertain clinical significance. This may be postsurgical or posttraumatic in etiology. Clinical correlation is advised. Electronically Signed   By: Kerby Moors M.D.   On: 06/25/2018 17:08   Ct Angio Chest Pe W Or Wo Contrast  Result Date: 06/26/2018 CLINICAL DATA:  72 year old female with history of weakness in her extremities. Positive D-dimer. EXAM: CT ANGIOGRAPHY  CHEST WITH CONTRAST TECHNIQUE: Multidetector CT imaging of the chest was performed using the standard protocol during bolus administration of intravenous contrast. Multiplanar CT image reconstructions and MIPs were obtained to evaluate the vascular anatomy. CONTRAST:  160mL ISOVUE-370 IOPAMIDOL (ISOVUE-370) INJECTION 76% COMPARISON:  Chest CT 11/17/2014. FINDINGS: Cardiovascular: No filling defects in the pulmonary arterial tree to suggest underlying pulmonary embolism. Heart size is borderline enlarged. There is no significant pericardial fluid, thickening or pericardial calcification. There is aortic atherosclerosis, as well as atherosclerosis of the great vessels of the mediastinum and the coronary arteries, including calcified atherosclerotic plaque in the left anterior descending and right coronary arteries. Mediastinum/Nodes: No pathologically enlarged mediastinal or hilar lymph nodes. Large hiatal hernia. No axillary lymphadenopathy. Lungs/Pleura: 5 mm right upper lobe nodule (axial image 54 of series 7), unchanged compared to 2016, considered definitively benign. No other suspicious pulmonary nodules or masses are noted. Chronic scarring in the lower lobes of the lungs bilaterally, similar to the prior examination from 2016. No acute consolidative airspace disease. No pleural effusions. Upper Abdomen: Unremarkable. Musculoskeletal: There are no aggressive appearing lytic or blastic lesions noted in the visualized portions of the skeleton. Review of the MIP images confirms the above findings. IMPRESSION: 1. No evidence of pulmonary embolism. 2. No acute findings in the thorax to account for the patient's symptoms. 3. Large hiatal hernia. 4. Aortic atherosclerosis, in addition to 2 vessel coronary artery disease. Assessment for potential risk factor modification, dietary therapy or pharmacologic therapy may be warranted, if clinically indicated. Aortic Atherosclerosis (ICD10-I70.0). Electronically Signed   By:  Vinnie Langton M.D.   On: 06/26/2018 02:42   US Venous Img Lower Bilateral  Result Date: 06/25/2018 CLINICAL DATA:  Bilateral lower extremity swelling with right thigh pain EXAM: BILATERAL LOWER EXTREMITY VENOUS DOPPLER ULTRASOUND TECHNIQUE: Gray-scale sonography with graded compression, as well as color Doppler and duplex ultrasound were performed to evaluate the lower extremity deep venous systems from the level of the common femoral vein and including the common femoral, femoral, profunda femoral, popliteal and calf veins including the posterior tibial, peroneal and gastrocnemius veins when visible. The superficial great saphenous vein was also interrogated. Spectral Doppler was utilized to evaluate flow at rest and with distal augmentation maneuvers in the common femoral, femoral and popliteal veins. COMPARISON:  None. FINDINGS: RIGHT LOWER EXTREMITY Common Femoral Vein: No evidence of thrombus. Normal compressibility, respiratory phasicity and response to augmentation. Saphenofemoral Junction: No evidence of thrombus. Normal compressibility and flow on color Doppler imaging. Profunda Femoral Vein: No evidence of thrombus. Normal compressibility and flow on color Doppler imaging. Femoral Vein: No evidence of thrombus. Normal compressibility, respiratory phasicity  and response to augmentation. Popliteal Vein: No evidence of thrombus. Normal compressibility, respiratory phasicity and response to augmentation. Calf Veins: No evidence of thrombus. Normal compressibility and flow on color Doppler imaging. Superficial Great Saphenous Vein: No evidence of thrombus. Normal compressibility. Venous Reflux:  None. Other Findings:  None. LEFT LOWER EXTREMITY Common Femoral Vein: No evidence of thrombus. Normal compressibility, respiratory phasicity and response to augmentation. Saphenofemoral Junction: No evidence of thrombus. Normal compressibility and flow on color Doppler imaging. Profunda Femoral Vein: No  evidence of thrombus. Normal compressibility and flow on color Doppler imaging. Femoral Vein: No evidence of thrombus. Normal compressibility, respiratory phasicity and response to augmentation. Popliteal Vein: No evidence of thrombus. Normal compressibility, respiratory phasicity and response to augmentation. Calf Veins: No evidence of thrombus. Normal compressibility and flow on color Doppler imaging. Superficial Great Saphenous Vein: No evidence of thrombus. Normal compressibility. Venous Reflux:  None. Other Findings:  None. IMPRESSION: No evidence of deep venous thrombosis in either lower extremity. Electronically Signed   By: Ilona Sorrel M.D.   On: 06/25/2018 18:07   Dg Chest Port 1 View  Result Date: 06/25/2018 CLINICAL DATA:  Generalized pain EXAM: PORTABLE CHEST 1 VIEW COMPARISON:  09/21/2017 chest radiograph. FINDINGS: Stable cardiomediastinal silhouette with normal heart size and moderate hiatal hernia. No pneumothorax. No pleural effusion. Stable minimal left basilar scarring versus atelectasis. No pulmonary edema. No acute consolidative airspace disease. IMPRESSION: 1. Minimal scarring versus atelectasis at the left lung base. Otherwise no active cardiopulmonary disease. 2. Moderate hiatal hernia. Electronically Signed   By: Ilona Sorrel M.D.   On: 06/25/2018 17:03    Time Spent in minutes  30   Lala Lund M.D on 06/26/2018 at 9:30 AM  To page go to [email or web address] - password [facility]

## 2018-06-26 NOTE — Progress Notes (Signed)
Critical Calcium level 6.4, Troponin 0.10 and Potassium 2.9. [place], NP notified.

## 2018-06-26 NOTE — Progress Notes (Signed)
Pt has new IV for CT angio. CT department notified that pt is ready for test. Will continue to monitor and treat pt per MD orders.

## 2018-06-26 NOTE — Progress Notes (Signed)
PHARMACY - PHYSICIAN COMMUNICATION CRITICAL VALUE ALERT - BLOOD CULTURE IDENTIFICATION (BCID)  Heather Lutz is an 72 y.o. female who presented to [facility] on 06/25/2018 with a chief complaint of sepsis, likely urinary source.  Assessment:  BCID with Enterococcus, no resistance detected  Name of physician (or Provider) Contacted:  Dr. Candiss Norse via text  Current antibiotics:  Day # 2 Vancomycin and Cefepime  Changes to prescribed antibiotics recommended and made:    Ampicillin 2 gm IV q4hrs  Results for orders placed or performed during the hospital encounter of 06/25/18  Blood Culture ID Panel (Reflexed) (Collected: 06/25/2018  6:28 PM)  Result Value Ref Range   Enterococcus species DETECTED (A) NOT DETECTED   Vancomycin resistance NOT DETECTED NOT DETECTED   Listeria monocytogenes NOT DETECTED NOT DETECTED   Staphylococcus species NOT DETECTED NOT DETECTED   Staphylococcus aureus (BCID) NOT DETECTED NOT DETECTED   Methicillin resistance NOT DETECTED NOT DETECTED   Streptococcus species NOT DETECTED NOT DETECTED   Streptococcus agalactiae NOT DETECTED NOT DETECTED   Streptococcus pneumoniae NOT DETECTED NOT DETECTED   Streptococcus pyogenes NOT DETECTED NOT DETECTED   Acinetobacter baumannii NOT DETECTED NOT DETECTED   Enterobacteriaceae species NOT DETECTED NOT DETECTED   Enterobacter cloacae complex NOT DETECTED NOT DETECTED   Escherichia coli NOT DETECTED NOT DETECTED   Klebsiella oxytoca NOT DETECTED NOT DETECTED   Klebsiella pneumoniae NOT DETECTED NOT DETECTED   Proteus species NOT DETECTED NOT DETECTED   Serratia marcescens NOT DETECTED NOT DETECTED   Carbapenem resistance NOT DETECTED NOT DETECTED   Haemophilus influenzae NOT DETECTED NOT DETECTED   Neisseria meningitidis NOT DETECTED NOT DETECTED   Pseudomonas aeruginosa NOT DETECTED NOT DETECTED   Candida albicans NOT DETECTED NOT DETECTED   Candida glabrata NOT DETECTED NOT DETECTED   Candida krusei NOT  DETECTED NOT DETECTED   Candida parapsilosis NOT DETECTED NOT DETECTED   Candida tropicalis NOT DETECTED NOT DETECTED    Arty Baumgartner, RPh Pager: [phone]  06/26/2018  5:52 PM

## 2018-06-26 NOTE — Progress Notes (Signed)
Pharmacy consulted to dose Feraheme for iron deficiency. Ferritin 11, Tsat 3% on iron panel.   Will dose 510mg  IV feraheme x 1. Follow up need for further doses in 1 week.   Thank you,   Lanier Felty A. Levada Dy, PharmD, [place] Pager: [phone] Please utilize Amion for appropriate phone number to reach the unit pharmacist ([place])

## 2018-06-26 NOTE — Care Management Note (Signed)
Case Management Note  Patient Details  Name: Heather Lutz MRN: [id number] Date of Birth: 1946-04-04  Subjective/Objective:                    Action/Plan:  Patient will need transport to home. Patient has home CNA private pay. No other home health prior to admission. Has lift chair, WC, 3/1, RW, scooter, hover-round. Patient's spouse would like Pur wick, explained these are not for home use. Spouse may be interested in [place] steady. Requested CM to ask nurse if they can try it out before she is discharged  Expected Discharge Date:                  Expected Discharge Plan:     In-House Referral:     Discharge planning Services  CM Consult  Post Acute Care Choice:    Choice offered to:     DME Arranged:    DME Agency:     HH Arranged:    [place] Agency:     Status of Service:  In process, will continue to follow  If discussed at Long Length of Stay Meetings, dates discussed:    Additional Comments:  Carles Collet, RN 06/26/2018, 4:26 PM

## 2018-06-26 NOTE — Evaluation (Signed)
Physical Therapy Evaluation Patient Details Name: Heather Lutz MRN: [id number] DOB: [birth year] Today's Date: 06/26/2018   History of Present Illness  Pt is a 72 y.o. F with significant PMH of GERD, multiple sclerosis, who presents with increase in frequency of urination and fever.   Clinical Impression  Pt admitted with above diagnosis. Pt currently with functional limitations due to the deficits listed below (see PT Problem List). Prior to admission, patient reports she lives with her husband and has an aide 4 days per week. She largely spends her time during the day and night in a lift chair; unfortunately, she is unable to drive her power wheelchair anymore due to blindness. Currently, patient requiring two person maximal assistance for rolling to both sides to perform peri care. Seems to be close to patient's functional baseline. Pt will benefit from skilled PT to increase their independence and safety with mobility to allow discharge to the venue listed below.       Follow Up Recommendations SNF v HHPT with Supervision/Assistance - 24 hour (dependening on husband's ability to provide adequate assistance)    Equipment Recommendations  Other (comment)(Hoyer Lift)    Recommendations for Other Services       Precautions / Restrictions Precautions Precautions: Fall Restrictions Weight Bearing Restrictions: No      Mobility  Bed Mobility Overal bed mobility: Needs Assistance Bed Mobility: Rolling Rolling: Max assist;+2 for physical assistance         General bed mobility comments: Max assist + 2 for rolling towards both sides to perform peri care. Somewhat easier for patient to roll to left versus right  Transfers                 General transfer comment: deferred  Ambulation/Gait                Stairs            Wheelchair Mobility    Modified Rankin (Stroke Patients Only)       Balance                                              Pertinent Vitals/Pain Pain Assessment: No/denies pain    Home Living Family/patient expects to be discharged to:: Private residence Living Arrangements: Spouse/significant other Available Help at Discharge: Family;Personal care attendant Type of Home: House Home Access: Ramped entrance     Home Layout: One level Home Equipment: Transport planner;Wheelchair - Education officer, community - power;Bedside commode      Prior Function Level of Independence: Needs assistance   Gait / Transfers Assistance Needed: Pt spends most of her time in a lift chair. Pt aide/husband will "lift" her from lift chair to "rollator" for transfers. Cannot drive power w/c anymore secondary to legal blindness  ADL's / Homemaking Assistance Needed: requires assist for all ADL's. able to self feed when provided set up in familiar environment        Hand Dominance        Extremity/Trunk Assessment   Upper Extremity Assessment Upper Extremity Assessment: RUE deficits/detail;LUE deficits/detail RUE Deficits / Details: Grossly 4/5 LUE Deficits / Details: History of shoulder fractures. Elbow extension/flexion 4/5, shoulder flexion limited    Lower Extremity Assessment Lower Extremity Assessment: RLE deficits/detail;LLE deficits/detail RLE Deficits / Details: No active movement LLE Deficits / Details: No active movement       Communication  Communication: No difficulties  Cognition Arousal/Alertness: Awake/alert Behavior During Therapy: WFL for tasks assessed/performed Overall Cognitive Status: Within Functional Limits for tasks assessed                                        General Comments      Exercises     Assessment/Plan    PT Assessment Patient needs continued PT services  PT Problem List Decreased strength;Decreased activity tolerance;Decreased mobility       PT Treatment Interventions Functional mobility training;Therapeutic activities;Therapeutic  exercise;Balance training;Patient/family education    PT Goals (Current goals can be found in the Care Plan section)  Acute Rehab PT Goals Patient Stated Goal: patient asking to be repositioned in  bed PT Goal Formulation: With patient Time For Goal Achievement: 07/10/18 Potential to Achieve Goals: Fair    Frequency Min 2X/week   Barriers to discharge        Co-evaluation               AM-PAC PT "6 Clicks" Daily Activity  Outcome Measure Difficulty turning over in bed (including adjusting bedclothes, sheets and blankets)?: Unable Difficulty moving from lying on back to sitting on the side of the bed? : Unable Difficulty sitting down on and standing up from a chair with arms (e.g., wheelchair, bedside commode, etc,.)?: Unable Help needed moving to and from a bed to chair (including a wheelchair)?: Total Help needed walking in hospital room?: Total Help needed climbing 3-5 steps with a railing? : Total 6 Click Score: 6    End of Session   Activity Tolerance: Patient tolerated treatment well Patient left: in bed;with call bell/phone within reach;with nursing/sitter in room Nurse Communication: Mobility status PT Visit Diagnosis: Muscle weakness (generalized) (M62.81);Other abnormalities of gait and mobility (R26.89)    Time: 8563-1497 PT Time Calculation (min) (ACUTE ONLY): 19 min   Charges:   PT Evaluation $PT Eval Moderate Complexity: 1 Mod         Ellamae Sia, [place], DPT Acute Rehabilitation Services Pager [phone] Office [phone]   Willy Eddy 06/26/2018, 1:42 PM

## 2018-06-26 NOTE — Consult Note (Signed)
[facility]    Date of Admission:  06/25/2018   Total days of antibiotics: 1 vanco/zosyn --> ampicillin               Reason for Consult: enterococcal bacteremia    Referring Provider: Tivis Ringer   Assessment: Enterococcal bacteremia (non-vre) Multiple sclerosis Protein Calorie Malnutrition Hypocalcemia   Plan: 1. Continue ampicillin 2. Aim for 7 days then 7 more days of po amoxil  3. Repeat BCx 4. Not pursue TEE  Comment- Pt appears to have urine source and is being excellently managed by [facility].  Suspect her change in sensation in her LE was due to infection exacerbating her MS.  Please call if any questions.   Thank you so much for this interesting consult,  Principal Problem:   Sepsis ([place]) Active Problems:   Multiple sclerosis ([place])   Acute lower UTI   Acute cystitis with hematuria   . aspirin  81 mg Oral Daily  . carvedilol  3.125 mg Oral BID WC  . enoxaparin (LOVENOX) injection  40 mg Subcutaneous Q24H  . ferrous sulfate  325 mg Oral TID WC  . furosemide  40 mg Intravenous Once  . [START ON 06/27/2018] Influenza vac split quadrivalent PF  0.5 mL Intramuscular Tomorrow-1000  . pantoprazole  40 mg Oral Daily  . polyethylene glycol  17 g Oral Daily  . [START ON 06/28/2018] Vitamin D (Ergocalciferol)  50,000 Units Oral Q7 days    HPI: Heather Lutz is a 72 y.o. female with hx of MS, adm on 10-12 with worsening numbness, achiness, weakness in LE for 24h. She also noted fever and polyuria. In ED her temp was 100.8 and she was normotensive. Her WBC was 13.7. Her UA showed 11-20 WBC.  Her BCx is now postive for enterococcus    Review of Systems: Review of Systems  Constitutional: Positive for fever. Negative for chills.  Respiratory: Negative for cough and shortness of breath.   Cardiovascular: Positive for orthopnea.  Gastrointestinal: Negative for diarrhea.  Genitourinary: Positive for dysuria and frequency.    Neurological: Positive for tingling and sensory change.  Please see HPI. All other systems reviewed and negative.   Past Medical History:  Diagnosis Date  . Blindness   . GERD (gastroesophageal reflux disease)   . Multiple sclerosis ([place])   . Osteoporosis     Social History   Tobacco Use  . Smoking status: Never Smoker  . Smokeless tobacco: Never Used  Substance Use Topics  . Alcohol use: No    Alcohol/week: 0.0 standard drinks  . Drug use: No    Family History  Problem Relation Age of Onset  . CAD Father      Medications:  Scheduled: . aspirin  81 mg Oral Daily  . carvedilol  3.125 mg Oral BID WC  . enoxaparin (LOVENOX) injection  40 mg Subcutaneous Q24H  . ferrous sulfate  325 mg Oral TID WC  . furosemide  40 mg Intravenous Once  . [START ON 06/27/2018] Influenza vac split quadrivalent PF  0.5 mL Intramuscular Tomorrow-1000  . pantoprazole  40 mg Oral Daily  . polyethylene glycol  17 g Oral Daily  . [START ON 06/28/2018] Vitamin D (Ergocalciferol)  50,000 Units Oral Q7 days    Abtx:  Anti-infectives (From admission, onward)   Start     Dose/Rate Route Frequency Ordered Stop   06/26/18 1800  ampicillin (OMNIPEN) 2 g in sodium chloride 0.9 %  100 mL IVPB     2 g 300 mL/hr over 20 Minutes Intravenous Every 4 hours 06/26/18 1713     06/26/18 1030  vancomycin (VANCOCIN) IVPB 750 mg/150 ml premix  Status:  Discontinued     750 mg 150 mL/hr over 60 Minutes Intravenous Every 12 hours 06/25/18 2203 06/26/18 1713   06/25/18 2215  vancomycin (VANCOCIN) 1,500 mg in sodium chloride 0.9 % 500 mL IVPB     1,500 mg 250 mL/hr over 120 Minutes Intravenous  Once 06/25/18 2203 06/26/18 0156   06/25/18 2215  ceFEPIme (MAXIPIME) 2 g in sodium chloride 0.9 % 100 mL IVPB  Status:  Discontinued     2 g 200 mL/hr over 30 Minutes Intravenous Every 12 hours 06/25/18 2203 06/26/18 1712   06/25/18 1815  piperacillin-tazobactam (ZOSYN) IVPB 3.375 g     3.375 g 100 mL/hr over 30 Minutes  Intravenous  Once 06/25/18 1809 06/25/18 1949        OBJECTIVE: Blood pressure (!) 144/67, pulse 93, temperature 98.5 F (36.9 C), temperature source Oral, resp. rate 18, height '5\' 3"'  (1.6 m), weight 79.1 kg, SpO2 99 %.  Physical Exam  Constitutional: She is oriented to person, place, and time. She appears well-developed and well-nourished.  HENT:  Mouth/Throat: No oropharyngeal exudate.  Eyes: Pupils are equal, round, and reactive to light. EOM are normal.  Neck: Normal range of motion. Neck supple.  Cardiovascular: Normal rate, regular rhythm and normal heart sounds.  Pulmonary/Chest: Effort normal and breath sounds normal.  Abdominal: Soft. Bowel sounds are normal. She exhibits no distension. There is no tenderness.  Musculoskeletal: She exhibits edema.  Lymphadenopathy:    She has no cervical adenopathy.  Neurological: She is alert and oriented to person, place, and time. She exhibits abnormal muscle tone.  Psychiatric: She has a normal mood and affect.    Lab Results Results for orders placed or performed during the hospital encounter of 06/25/18 (from the past 48 hour(s))  Urinalysis, Routine w reflex microscopic     Status: Abnormal   Collection Time: 06/25/18  3:50 PM  Result Value Ref Range   Color, Urine YELLOW YELLOW   APPearance CLOUDY (A) CLEAR   Specific Gravity, Urine 1.020 1.005 - 1.030   pH 8.0 5.0 - 8.0   Glucose, UA NEGATIVE NEGATIVE mg/dL   Hgb urine dipstick MODERATE (A) NEGATIVE   Bilirubin Urine NEGATIVE NEGATIVE   Ketones, ur 15 (A) NEGATIVE mg/dL   Protein, ur NEGATIVE NEGATIVE mg/dL   Nitrite POSITIVE (A) NEGATIVE   Leukocytes, UA TRACE (A) NEGATIVE    Comment: Performed at [facility], [address] [place], [place] [address]  Urinalysis, Microscopic (reflex)     Status: Abnormal   Collection Time: 06/25/18  3:50 PM  Result Value Ref Range   RBC / HPF 6-10 0 - 5 RBC/hpf   WBC, UA 11-20 0 - 5 WBC/hpf   Bacteria, UA MANY (A) NONE  SEEN   Squamous Epithelial / LPF 0-5 0 - 5   WBC Clumps PRESENT     Comment: Performed at [facility], [address] [place], [place] [address]  Comprehensive metabolic panel     Status: Abnormal   Collection Time: 06/25/18  3:54 PM  Result Value Ref Range   Sodium 139 135 - 145 mmol/L   Potassium 4.0 3.5 - 5.1 mmol/L   Chloride 103 98 - 111 mmol/L   CO2 23 22 - 32 mmol/L  Glucose, Bld 101 (H) 70 - 99 mg/dL   BUN 21 8 - 23 mg/dL   Creatinine, Ser 0.95 0.44 - 1.00 mg/dL   Calcium 9.3 8.9 - 10.3 mg/dL   Total Protein 7.5 6.5 - 8.1 g/dL   Albumin 3.7 3.5 - 5.0 g/dL   AST 25 15 - 41 U/L   ALT 14 0 - 44 U/L   Alkaline Phosphatase 111 38 - 126 U/L   Total Bilirubin 0.5 0.3 - 1.2 mg/dL   GFR calc non Af Amer 58 (L) >60 mL/min   GFR calc Af Amer >60 >60 mL/min    Comment: (NOTE) The eGFR has been calculated using the CKD EPI equation. This calculation has not been validated in all clinical situations. eGFR's persistently <60 mL/min signify possible Chronic Kidney Disease.    Anion gap 13 5 - 15    Comment: Performed at [facility], [address] [place], [place] [address]  Troponin I     Status: None   Collection Time: 06/25/18  3:54 PM  Result Value Ref Range   Troponin I <0.03 <0.03 ng/mL    Comment: Performed at [facility], [address] [place], [place] [address]  CBC with Differential     Status: Abnormal   Collection Time: 06/25/18  3:54 PM  Result Value Ref Range   WBC 13.7 (H) 4.0 - 10.5 K/uL   RBC 4.51 3.87 - 5.11 MIL/uL   Hemoglobin 9.7 (L) 12.0 - 15.0 g/dL   HCT 31.8 (L) 36.0 - 46.0 %   MCV 70.5 (L) 80.0 - 100.0 fL   MCH 21.5 (L) 26.0 - 34.0 pg   MCHC 30.5 30.0 - 36.0 g/dL   RDW 20.8 (H) 11.5 - 15.5 %   Platelets 204 150 - 400 K/uL   nRBC 0.0 0.0 - 0.2 %   Neutrophils Relative % 86 %   Neutro Abs 11.7 (H) 1.7 - 7.7 K/uL   Lymphocytes Relative 6 %   Lymphs Abs 0.8 0.7 - 4.0 K/uL   Monocytes Relative 8 %   Monocytes  Absolute 1.1 (H) 0.1 - 1.0 K/uL   Eosinophils Relative 0 %   Eosinophils Absolute 0.0 0.0 - 0.5 K/uL   Basophils Relative 0 %   Basophils Absolute 0.0 0.0 - 0.1 K/uL   Immature Granulocytes 0 %   Abs Immature Granulocytes 0.06 0.00 - 0.07 K/uL    Comment: Performed at [facility], [address] [place], [place] [address]  Protime-INR     Status: None   Collection Time: 06/25/18  3:54 PM  Result Value Ref Range   Prothrombin Time 13.4 11.4 - 15.2 seconds   INR 1.03     Comment: Performed at [facility], [address] [place], [place] [address]  Lipase, blood     Status: None   Collection Time: 06/25/18  3:54 PM  Result Value Ref Range   Lipase 23 11 - 51 U/L    Comment: Performed at [facility], [address] [place], [place] [address]  Culture, blood (routine x 2)     Status: None (Preliminary result)   Collection Time: 06/25/18  6:20 PM  Result Value Ref Range   Specimen Description      BLOOD RIGHT ARM Performed at [facility], [address] [place],  [address]    Special Requests      BOTTLES DRAWN  AEROBIC AND ANAEROBIC Blood Culture results may not be optimal due to an inadequate volume of blood received in culture bottles Performed at [facility], [address] [place], [place] [address]    Culture  Setup Time      GRAM POSITIVE COCCI IN CHAINS IN BOTH AEROBIC AND ANAEROBIC BOTTLES CRITICAL VALUE NOTED.  VALUE IS CONSISTENT WITH PREVIOUSLY REPORTED AND CALLED VALUE. Performed at [facility], [place] [address] [place], [place] [address]    Culture GRAM POSITIVE COCCI IN CHAINS    Report Status PENDING   Culture, blood (routine x 2)     Status: None (Preliminary result)   Collection Time: 06/25/18  6:28 PM  Result Value Ref Range   Specimen Description      BLOOD LEFT ARM Performed at [facility], [address] [place], [place] [address]    Special Requests      BOTTLES  DRAWN AEROBIC AND ANAEROBIC Blood Culture adequate volume Performed at [facility], [address] [place], [place] [address]    Culture  Setup Time      GRAM POSITIVE COCCI IN CHAINS IN BOTH AEROBIC AND ANAEROBIC BOTTLES CRITICAL RESULT CALLED TO, READ BACK BY AND VERIFIED WITH: Alesia Morin [phone] MLM Performed at [facility], [place] [address] [place], [place] [address]    Culture GRAM POSITIVE COCCI IN CHAINS    Report Status PENDING   Blood Culture ID Panel (Reflexed)     Status: Abnormal   Collection Time: 06/25/18  6:28 PM  Result Value Ref Range   Enterococcus species DETECTED (A) NOT DETECTED    Comment: CRITICAL RESULT CALLED TO, READ BACK BY AND VERIFIED WITH: PHARMD T EGAN [phone] MLM    Vancomycin resistance NOT DETECTED NOT DETECTED   Listeria monocytogenes NOT DETECTED NOT DETECTED   Staphylococcus species NOT DETECTED NOT DETECTED   Staphylococcus aureus (BCID) NOT DETECTED NOT DETECTED   Methicillin resistance NOT DETECTED NOT DETECTED   Streptococcus species NOT DETECTED NOT DETECTED   Streptococcus agalactiae NOT DETECTED NOT DETECTED   Streptococcus pneumoniae NOT DETECTED NOT DETECTED   Streptococcus pyogenes NOT DETECTED NOT DETECTED   Acinetobacter baumannii NOT DETECTED NOT DETECTED   Enterobacteriaceae species NOT DETECTED NOT DETECTED   Enterobacter cloacae complex NOT DETECTED NOT DETECTED   Escherichia coli NOT DETECTED NOT DETECTED   Klebsiella oxytoca NOT DETECTED NOT DETECTED   Klebsiella pneumoniae NOT DETECTED NOT DETECTED   Proteus species NOT DETECTED NOT DETECTED   Serratia marcescens NOT DETECTED NOT DETECTED   Carbapenem resistance NOT DETECTED NOT DETECTED   Haemophilus influenzae NOT DETECTED NOT DETECTED   Neisseria meningitidis NOT DETECTED NOT DETECTED   Pseudomonas aeruginosa NOT DETECTED NOT DETECTED   Candida albicans NOT DETECTED NOT DETECTED   Candida glabrata NOT DETECTED NOT DETECTED   Candida  krusei NOT DETECTED NOT DETECTED   Candida parapsilosis NOT DETECTED NOT DETECTED   Candida tropicalis NOT DETECTED NOT DETECTED    Comment: Performed at [facility], [address], [place], [place] [address]  I-Stat CG4 Lactic Acid, ED     Status: None   Collection Time: 06/25/18  6:40 PM  Result Value Ref Range   Lactic Acid, Venous 0.96 0.5 - 1.9 mmol/L  Troponin I     Status: Abnormal   Collection Time: 06/25/18 10:07 PM  Result Value Ref Range   Troponin I 0.06 (HH) <0.03  ng/mL    Comment: CRITICAL RESULT CALLED TO, READ BACK BY AND VERIFIED WITH: MASLENJAK I,RN 06/25/18 2311 WAYK Performed at [facility] [address], [place], [place] [address]   TSH     Status: None   Collection Time: 06/25/18 10:07 PM  Result Value Ref Range   TSH 1.286 0.350 - 4.500 uIU/mL    Comment: Performed by a 3rd Generation assay with a functional sensitivity of <=0.01 uIU/mL. Performed at [facility], [place] [address], [place], [place] [address]   D-dimer, quantitative (not at [facility])     Status: Abnormal   Collection Time: 06/25/18 10:07 PM  Result Value Ref Range   D-Dimer, Quant 0.63 (H) 0.00 - 0.50 ug/mL-FEU    Comment: (NOTE) At the manufacturer cut-off of 0.50 ug/mL FEU, this assay has been documented to exclude PE with a sensitivity and negative predictive value of 97 to 99%.  At this time, this assay has not been approved by the FDA to exclude DVT/VTE. Results should be correlated with clinical presentation. Performed at [facility], [place] [address], [place], [place] [address]   Comprehensive metabolic panel     Status: Abnormal   Collection Time: 06/26/18  3:52 AM  Result Value Ref Range   Sodium 138 135 - 145 mmol/L   Potassium 2.9 (L) 3.5 - 5.1 mmol/L   Chloride 116 (H) 98 - 111 mmol/L   CO2 18 (L) 22 - 32 mmol/L   Glucose, Bld 85 70 - 99 mg/dL   BUN 13 8 - 23 mg/dL   Creatinine, Ser 0.90 0.44 - 1.00 mg/dL   Calcium 6.4 (LL) 8.9 - 10.3 mg/dL     Comment: CRITICAL RESULT CALLED TO, READ BACK BY AND VERIFIED WITH: MASLENJAK I,RN 06/26/18 0513 WAYK    Total Protein 4.5 (L) 6.5 - 8.1 g/dL   Albumin 2.0 (L) 3.5 - 5.0 g/dL   AST 15 15 - 41 U/L   ALT 10 0 - 44 U/L   Alkaline Phosphatase 64 38 - 126 U/L   Total Bilirubin 0.5 0.3 - 1.2 mg/dL   GFR calc non Af Amer >60 >60 mL/min   GFR calc Af Amer >60 >60 mL/min    Comment: (NOTE) The eGFR has been calculated using the CKD EPI equation. This calculation has not been validated in all clinical situations. eGFR's persistently <60 mL/min signify possible Chronic Kidney Disease.    Anion gap 4 (L) 5 - 15    Comment: Performed at [facility] [address], [place], [place] [address]  CBC     Status: Abnormal   Collection Time: 06/26/18  3:52 AM  Result Value Ref Range   WBC 15.0 (H) 4.0 - 10.5 K/uL   RBC 3.57 (L) 3.87 - 5.11 MIL/uL   Hemoglobin 7.5 (L) 12.0 - 15.0 g/dL    Comment: Reticulocyte Hemoglobin testing may be clinically indicated, consider ordering this additional test [id number]    HCT 25.1 (L) 36.0 - 46.0 %   MCV 70.3 (L) 80.0 - 100.0 fL   MCH 21.0 (L) 26.0 - 34.0 pg   MCHC 29.9 (L) 30.0 - 36.0 g/dL   RDW 20.8 (H) 11.5 - 15.5 %   Platelets 179 150 - 400 K/uL   nRBC 0.0 0.0 - 0.2 %    Comment: Performed at [facility], [place] [address], [place], [place] [address]  Troponin I     Status: Abnormal   Collection Time:  06/26/18  3:52 AM  Result Value Ref Range   Troponin I 0.10 (HH) <0.03 ng/mL    Comment: CRITICAL VALUE NOTED.  VALUE IS CONSISTENT WITH PREVIOUSLY REPORTED AND CALLED VALUE. Performed at [facility], [place] [address] [place], [place] [address]   Ferritin     Status: None   Collection Time: 06/26/18  3:52 AM  Result Value Ref Range   Ferritin 11 11 - 307 ng/mL    Comment: Performed at [facility], [place] [address], [place], [place] [address]  Iron and TIBC     Status: Abnormal   Collection Time: 06/26/18  3:52 AM  Result Value  Ref Range   Iron 9 (L) 28 - 170 ug/dL   TIBC 343 250 - 450 ug/dL   Saturation Ratios 3 (L) 10.4 - 31.8 %   UIBC 334 ug/dL    Comment: Performed at [facility], [place] [address], [place], [place] [address]  Vitamin B12     Status: None   Collection Time: 06/26/18  3:52 AM  Result Value Ref Range   Vitamin B-12 194 180 - 914 pg/mL    Comment: (NOTE) This assay is not validated for testing neonatal or myeloproliferative syndrome specimens for Vitamin B12 levels. Performed at [facility], [place] [address], [place], [place] [address]   Folate     Status: None   Collection Time: 06/26/18  3:52 AM  Result Value Ref Range   Folate 7.4 >5.9 ng/mL    Comment: Performed at [facility], [place] [address], [place], [place] [address]  Magnesium     Status: Abnormal   Collection Time: 06/26/18  3:52 AM  Result Value Ref Range   Magnesium 1.2 (L) 1.7 - 2.4 mg/dL    Comment: Performed at [facility] [address] [place], [place] [address]  Brain natriuretic peptide     Status: Abnormal   Collection Time: 06/26/18  3:52 AM  Result Value Ref Range   B Natriuretic Peptide 181.7 (H) 0.0 - 100.0 pg/mL    Comment: Performed at [facility] [address], [place], [place] [address]  Troponin I     Status: Abnormal   Collection Time: 06/26/18 11:19 AM  Result Value Ref Range   Troponin I 0.19 (HH) <0.03 ng/mL    Comment: CRITICAL VALUE NOTED.  VALUE IS CONSISTENT WITH PREVIOUSLY REPORTED AND CALLED VALUE. Performed at [facility], [place] [address], [place], [place] [address]       Component Value Date/Time   SDES  06/25/2018 1828    BLOOD LEFT ARM Performed at [facility], [address] [place], [place] [address]    [facility]  06/25/2018 1828    BOTTLES DRAWN AEROBIC AND ANAEROBIC Blood Culture adequate volume Performed at [facility], [address] [place], [place] [address]    CULT GRAM POSITIVE COCCI IN CHAINS 06/25/2018  1828   REPTSTATUS PENDING 06/25/2018 1828   Ct Head Wo Contrast  Result Date: 06/25/2018 CLINICAL DATA:  Neurologic deficit, subacute. Worsening muscle spasms. EXAM: CT HEAD WITHOUT CONTRAST TECHNIQUE: Contiguous axial images were obtained from the base of the skull through the vertex without intravenous contrast. COMPARISON:  None. FINDINGS: Brain: No evidence of acute infarction, hemorrhage, hydrocephalus, extra-axial collection or mass lesion/mass effect. There is mild diffuse low-attenuation within the subcortical and periventricular white matter compatible with chronic microvascular disease. Vascular: No hyperdense  vessel or unexpected calcification. Skull: Normal. Negative for fracture or focal lesion. Sinuses/Orbits: The paranasal sinuses and mastoid air cells are clear. Within the anterior left globe there is a 2 mm calcific or metallic foreign body of uncertain etiology. A normal appearing left lens is not visualized and this may be postsurgical. Other: None IMPRESSION: 1. No acute intracranial abnormalities. 2. Chronic small vessel ischemic change. 3. Small radiopaque foreign body within the anterior left globe is identified of uncertain clinical significance. This may be postsurgical or posttraumatic in etiology. Clinical correlation is advised. Electronically Signed   By: Kerby Moors M.D.   On: 06/25/2018 17:08   Ct Angio Chest Pe W Or Wo Contrast  Result Date: 06/26/2018 CLINICAL DATA:  72 year old female with history of weakness in her extremities. Positive D-dimer. EXAM: CT ANGIOGRAPHY CHEST WITH CONTRAST TECHNIQUE: Multidetector CT imaging of the chest was performed using the standard protocol during bolus administration of intravenous contrast. Multiplanar CT image reconstructions and MIPs were obtained to evaluate the vascular anatomy. CONTRAST:  173m ISOVUE-370 IOPAMIDOL (ISOVUE-370) INJECTION 76% COMPARISON:  Chest CT 11/17/2014. FINDINGS: Cardiovascular: No filling defects in the  pulmonary arterial tree to suggest underlying pulmonary embolism. Heart size is borderline enlarged. There is no significant pericardial fluid, thickening or pericardial calcification. There is aortic atherosclerosis, as well as atherosclerosis of the great vessels of the mediastinum and the coronary arteries, including calcified atherosclerotic plaque in the left anterior descending and right coronary arteries. Mediastinum/Nodes: No pathologically enlarged mediastinal or hilar lymph nodes. Large hiatal hernia. No axillary lymphadenopathy. Lungs/Pleura: 5 mm right upper lobe nodule (axial image 54 of series 7), unchanged compared to 2016, considered definitively benign. No other suspicious pulmonary nodules or masses are noted. Chronic scarring in the lower lobes of the lungs bilaterally, similar to the prior examination from 2016. No acute consolidative airspace disease. No pleural effusions. Upper Abdomen: Unremarkable. Musculoskeletal: There are no aggressive appearing lytic or blastic lesions noted in the visualized portions of the skeleton. Review of the MIP images confirms the above findings. IMPRESSION: 1. No evidence of pulmonary embolism. 2. No acute findings in the thorax to account for the patient's symptoms. 3. Large hiatal hernia. 4. Aortic atherosclerosis, in addition to 2 vessel coronary artery disease. Assessment for potential risk factor modification, dietary therapy or pharmacologic therapy may be warranted, if clinically indicated. Aortic Atherosclerosis (ICD10-I70.0). Electronically Signed   By: DVinnie LangtonM.D.   On: 06/26/2018 02:42   UKoreaVenous Img Lower Bilateral  Result Date: 06/25/2018 CLINICAL DATA:  Bilateral lower extremity swelling with right thigh pain EXAM: BILATERAL LOWER EXTREMITY VENOUS DOPPLER ULTRASOUND TECHNIQUE: Gray-scale sonography with graded compression, as well as color Doppler and duplex ultrasound were performed to evaluate the lower extremity deep venous  systems from the level of the common femoral vein and including the common femoral, femoral, profunda femoral, popliteal and calf veins including the posterior tibial, peroneal and gastrocnemius veins when visible. The superficial great saphenous vein was also interrogated. Spectral Doppler was utilized to evaluate flow at rest and with distal augmentation maneuvers in the common femoral, femoral and popliteal veins. COMPARISON:  None. FINDINGS: RIGHT LOWER EXTREMITY Common Femoral Vein: No evidence of thrombus. Normal compressibility, respiratory phasicity and response to augmentation. Saphenofemoral Junction: No evidence of thrombus. Normal compressibility and flow on color Doppler imaging. Profunda Femoral Vein: No evidence of thrombus. Normal compressibility and flow on color Doppler imaging. Femoral Vein: No evidence of thrombus. Normal compressibility, respiratory phasicity and response to augmentation. Popliteal Vein:  No evidence of thrombus. Normal compressibility, respiratory phasicity and response to augmentation. Calf Veins: No evidence of thrombus. Normal compressibility and flow on color Doppler imaging. Superficial Great Saphenous Vein: No evidence of thrombus. Normal compressibility. Venous Reflux:  None. Other Findings:  None. LEFT LOWER EXTREMITY Common Femoral Vein: No evidence of thrombus. Normal compressibility, respiratory phasicity and response to augmentation. Saphenofemoral Junction: No evidence of thrombus. Normal compressibility and flow on color Doppler imaging. Profunda Femoral Vein: No evidence of thrombus. Normal compressibility and flow on color Doppler imaging. Femoral Vein: No evidence of thrombus. Normal compressibility, respiratory phasicity and response to augmentation. Popliteal Vein: No evidence of thrombus. Normal compressibility, respiratory phasicity and response to augmentation. Calf Veins: No evidence of thrombus. Normal compressibility and flow on color Doppler imaging.  Superficial Great Saphenous Vein: No evidence of thrombus. Normal compressibility. Venous Reflux:  None. Other Findings:  None. IMPRESSION: No evidence of deep venous thrombosis in either lower extremity. Electronically Signed   By: Ilona Sorrel M.D.   On: 06/25/2018 18:07   Dg Chest Port 1 View  Result Date: 06/26/2018 CLINICAL DATA:  Shortness of breath. EXAM: PORTABLE CHEST 1 VIEW COMPARISON:  CT chest from same day.  Chest x-ray from yesterday. FINDINGS: The heart size and mediastinal contours are within normal limits. Normal pulmonary vascularity. Unchanged linear scarring at the left lung base. No focal consolidation, pleural effusion, or pneumothorax. No acute osseous abnormality. Unchanged moderate hiatal hernia. IMPRESSION: No active disease. Electronically Signed   By: Titus Dubin M.D.   On: 06/26/2018 16:54   Dg Chest Port 1 View  Result Date: 06/25/2018 CLINICAL DATA:  Generalized pain EXAM: PORTABLE CHEST 1 VIEW COMPARISON:  09/21/2017 chest radiograph. FINDINGS: Stable cardiomediastinal silhouette with normal heart size and moderate hiatal hernia. No pneumothorax. No pleural effusion. Stable minimal left basilar scarring versus atelectasis. No pulmonary edema. No acute consolidative airspace disease. IMPRESSION: 1. Minimal scarring versus atelectasis at the left lung base. Otherwise no active cardiopulmonary disease. 2. Moderate hiatal hernia. Electronically Signed   By: Ilona Sorrel M.D.   On: 06/25/2018 17:03   Recent Results (from the past 240 hour(s))  Culture, blood (routine x 2)     Status: None (Preliminary result)   Collection Time: 06/25/18  6:20 PM  Result Value Ref Range Status   Specimen Description   Final    BLOOD RIGHT ARM Performed at [facility], [address] [place], [place] [address]    Special Requests   Final    BOTTLES DRAWN AEROBIC AND ANAEROBIC Blood Culture results may not be optimal due to an inadequate volume of blood received in  culture bottles Performed at [facility], [address] [place], [place] [address]    Culture  Setup Time   Final    GRAM POSITIVE COCCI IN CHAINS IN BOTH AEROBIC AND ANAEROBIC BOTTLES CRITICAL VALUE NOTED.  VALUE IS CONSISTENT WITH PREVIOUSLY REPORTED AND CALLED VALUE. Performed at [facility], [place] [address] [place], [place] [address]    Culture GRAM POSITIVE COCCI IN CHAINS  Final   Report Status PENDING  Incomplete  Culture, blood (routine x 2)     Status: None (Preliminary result)   Collection Time: 06/25/18  6:28 PM  Result Value Ref Range Status   Specimen Description   Final    BLOOD LEFT ARM Performed at [facility], [address], [place], [place] [address]    Special Requests  Final    BOTTLES DRAWN AEROBIC AND ANAEROBIC Blood Culture adequate volume Performed at [facility], [address] [place], [place] [address]    Culture  Setup Time   Final    GRAM POSITIVE COCCI IN CHAINS IN BOTH AEROBIC AND ANAEROBIC BOTTLES CRITICAL RESULT CALLED TO, READ BACK BY AND VERIFIED WITH: Alesia Morin [phone] MLM Performed at [facility], [place] [address], [place], [place] [address]    Culture GRAM POSITIVE COCCI IN CHAINS  Final   Report Status PENDING  Incomplete  Blood Culture ID Panel (Reflexed)     Status: Abnormal   Collection Time: 06/25/18  6:28 PM  Result Value Ref Range Status   Enterococcus species DETECTED (A) NOT DETECTED Final    Comment: CRITICAL RESULT CALLED TO, READ BACK BY AND VERIFIED WITH: PHARMD T EGAN [id number] MLM    Vancomycin resistance NOT DETECTED NOT DETECTED Final   Listeria monocytogenes NOT DETECTED NOT DETECTED Final   Staphylococcus species NOT DETECTED NOT DETECTED Final   Staphylococcus aureus (BCID) NOT DETECTED NOT DETECTED Final   Methicillin resistance NOT DETECTED NOT DETECTED Final   Streptococcus species NOT DETECTED NOT DETECTED Final   Streptococcus agalactiae NOT DETECTED  NOT DETECTED Final   Streptococcus pneumoniae NOT DETECTED NOT DETECTED Final   Streptococcus pyogenes NOT DETECTED NOT DETECTED Final   Acinetobacter baumannii NOT DETECTED NOT DETECTED Final   Enterobacteriaceae species NOT DETECTED NOT DETECTED Final   Enterobacter cloacae complex NOT DETECTED NOT DETECTED Final   Escherichia coli NOT DETECTED NOT DETECTED Final   Klebsiella oxytoca NOT DETECTED NOT DETECTED Final   Klebsiella pneumoniae NOT DETECTED NOT DETECTED Final   Proteus species NOT DETECTED NOT DETECTED Final   Serratia marcescens NOT DETECTED NOT DETECTED Final   Carbapenem resistance NOT DETECTED NOT DETECTED Final   Haemophilus influenzae NOT DETECTED NOT DETECTED Final   Neisseria meningitidis NOT DETECTED NOT DETECTED Final   Pseudomonas aeruginosa NOT DETECTED NOT DETECTED Final   Candida albicans NOT DETECTED NOT DETECTED Final   Candida glabrata NOT DETECTED NOT DETECTED Final   Candida krusei NOT DETECTED NOT DETECTED Final   Candida parapsilosis NOT DETECTED NOT DETECTED Final   Candida tropicalis NOT DETECTED NOT DETECTED Final    Comment: Performed at [facility], [address], [place], [place] [address]    Microbiology: Recent Results (from the past 240 hour(s))  Culture, blood (routine x 2)     Status: None (Preliminary result)   Collection Time: 06/25/18  6:20 PM  Result Value Ref Range Status   Specimen Description   Final    BLOOD RIGHT ARM Performed at [facility], [address] [place], [place] [address]    Special Requests   Final    BOTTLES DRAWN AEROBIC AND ANAEROBIC Blood Culture results may not be optimal due to an inadequate volume of blood received in culture bottles Performed at [facility], [address] [place], [place] [address]    Culture  Setup Time   Final    GRAM POSITIVE COCCI IN CHAINS IN BOTH AEROBIC AND ANAEROBIC BOTTLES CRITICAL VALUE NOTED.  VALUE IS CONSISTENT WITH PREVIOUSLY REPORTED AND  CALLED VALUE. Performed at [facility], [place] [address], [place], [place] [address]    Culture GRAM POSITIVE COCCI IN CHAINS  Final   Report Status PENDING  Incomplete  Culture, blood (routine x 2)  Status: None (Preliminary result)   Collection Time: 06/25/18  6:28 PM  Result Value Ref Range Status   Specimen Description   Final    BLOOD LEFT ARM Performed at [facility], [address] [place], [place] [address]    Special Requests   Final    BOTTLES DRAWN AEROBIC AND ANAEROBIC Blood Culture adequate volume Performed at [facility], [address] [place], [place] [address]    Culture  Setup Time   Final    GRAM POSITIVE COCCI IN CHAINS IN BOTH AEROBIC AND ANAEROBIC BOTTLES CRITICAL RESULT CALLED TO, READ BACK BY AND VERIFIED WITH: Alesia Morin [phone] MLM Performed at [facility], [place] [address] [place], [place] [address]    Culture GRAM POSITIVE COCCI IN CHAINS  Final   Report Status PENDING  Incomplete  Blood Culture ID Panel (Reflexed)     Status: Abnormal   Collection Time: 06/25/18  6:28 PM  Result Value Ref Range Status   Enterococcus species DETECTED (A) NOT DETECTED Final    Comment: CRITICAL RESULT CALLED TO, READ BACK BY AND VERIFIED WITH: PHARMD T EGAN [phone] MLM    Vancomycin resistance NOT DETECTED NOT DETECTED Final   Listeria monocytogenes NOT DETECTED NOT DETECTED Final   Staphylococcus species NOT DETECTED NOT DETECTED Final   Staphylococcus aureus (BCID) NOT DETECTED NOT DETECTED Final   Methicillin resistance NOT DETECTED NOT DETECTED Final   Streptococcus species NOT DETECTED NOT DETECTED Final   Streptococcus agalactiae NOT DETECTED NOT DETECTED Final   Streptococcus pneumoniae NOT DETECTED NOT DETECTED Final   Streptococcus pyogenes NOT DETECTED NOT DETECTED Final   Acinetobacter baumannii NOT DETECTED NOT DETECTED Final   Enterobacteriaceae species NOT DETECTED NOT DETECTED Final   Enterobacter  cloacae complex NOT DETECTED NOT DETECTED Final   Escherichia coli NOT DETECTED NOT DETECTED Final   Klebsiella oxytoca NOT DETECTED NOT DETECTED Final   Klebsiella pneumoniae NOT DETECTED NOT DETECTED Final   Proteus species NOT DETECTED NOT DETECTED Final   Serratia marcescens NOT DETECTED NOT DETECTED Final   Carbapenem resistance NOT DETECTED NOT DETECTED Final   Haemophilus influenzae NOT DETECTED NOT DETECTED Final   Neisseria meningitidis NOT DETECTED NOT DETECTED Final   Pseudomonas aeruginosa NOT DETECTED NOT DETECTED Final   Candida albicans NOT DETECTED NOT DETECTED Final   Candida glabrata NOT DETECTED NOT DETECTED Final   Candida krusei NOT DETECTED NOT DETECTED Final   Candida parapsilosis NOT DETECTED NOT DETECTED Final   Candida tropicalis NOT DETECTED NOT DETECTED Final    Comment: Performed at [facility], [address] [place], [place] [address]    Radiographs and labs were personally reviewed by me.   Bobby Rumpf, MD [facility] [place] [facility] [phone] 06/26/2018, 5:47 PM

## 2018-06-27 DIAGNOSIS — M810 Age-related osteoporosis without current pathological fracture: Secondary | ICD-10-CM | POA: Diagnosis present

## 2018-06-27 DIAGNOSIS — E46 Unspecified protein-calorie malnutrition: Secondary | ICD-10-CM | POA: Diagnosis present

## 2018-06-27 DIAGNOSIS — K449 Diaphragmatic hernia without obstruction or gangrene: Secondary | ICD-10-CM | POA: Diagnosis present

## 2018-06-27 DIAGNOSIS — R1010 Upper abdominal pain, unspecified: Secondary | ICD-10-CM | POA: Diagnosis not present

## 2018-06-27 DIAGNOSIS — G35 Multiple sclerosis: Secondary | ICD-10-CM | POA: Diagnosis present

## 2018-06-27 DIAGNOSIS — Z23 Encounter for immunization: Secondary | ICD-10-CM | POA: Diagnosis not present

## 2018-06-27 DIAGNOSIS — R531 Weakness: Secondary | ICD-10-CM | POA: Diagnosis not present

## 2018-06-27 DIAGNOSIS — Z90710 Acquired absence of both cervix and uterus: Secondary | ICD-10-CM | POA: Diagnosis not present

## 2018-06-27 DIAGNOSIS — Z6830 Body mass index (BMI) 30.0-30.9, adult: Secondary | ICD-10-CM | POA: Diagnosis not present

## 2018-06-27 DIAGNOSIS — N3001 Acute cystitis with hematuria: Secondary | ICD-10-CM | POA: Diagnosis not present

## 2018-06-27 DIAGNOSIS — H548 Legal blindness, as defined in USA: Secondary | ICD-10-CM | POA: Diagnosis present

## 2018-06-27 DIAGNOSIS — N136 Pyonephrosis: Secondary | ICD-10-CM | POA: Diagnosis present

## 2018-06-27 DIAGNOSIS — K219 Gastro-esophageal reflux disease without esophagitis: Secondary | ICD-10-CM | POA: Diagnosis present

## 2018-06-27 DIAGNOSIS — D509 Iron deficiency anemia, unspecified: Secondary | ICD-10-CM | POA: Diagnosis present

## 2018-06-27 DIAGNOSIS — Z993 Dependence on wheelchair: Secondary | ICD-10-CM | POA: Diagnosis not present

## 2018-06-27 DIAGNOSIS — R52 Pain, unspecified: Secondary | ICD-10-CM | POA: Diagnosis not present

## 2018-06-27 DIAGNOSIS — A4181 Sepsis due to Enterococcus: Secondary | ICD-10-CM | POA: Diagnosis present

## 2018-06-27 DIAGNOSIS — E876 Hypokalemia: Secondary | ICD-10-CM | POA: Diagnosis present

## 2018-06-27 DIAGNOSIS — Z8249 Family history of ischemic heart disease and other diseases of the circulatory system: Secondary | ICD-10-CM | POA: Diagnosis not present

## 2018-06-27 DIAGNOSIS — Z7401 Bed confinement status: Secondary | ICD-10-CM | POA: Diagnosis not present

## 2018-06-27 DIAGNOSIS — Z91018 Allergy to other foods: Secondary | ICD-10-CM | POA: Diagnosis not present

## 2018-06-27 DIAGNOSIS — R911 Solitary pulmonary nodule: Secondary | ICD-10-CM | POA: Diagnosis present

## 2018-06-27 DIAGNOSIS — I447 Left bundle-branch block, unspecified: Secondary | ICD-10-CM | POA: Diagnosis present

## 2018-06-27 DIAGNOSIS — Z888 Allergy status to other drugs, medicaments and biological substances status: Secondary | ICD-10-CM | POA: Diagnosis not present

## 2018-06-27 LAB — CBC
HCT: 27.5 % — ABNORMAL LOW (ref 36.0–46.0)
Hemoglobin: 8.1 g/dL — ABNORMAL LOW (ref 12.0–15.0)
MCH: 20.5 pg — ABNORMAL LOW (ref 26.0–34.0)
MCHC: 29.5 g/dL — ABNORMAL LOW (ref 30.0–36.0)
MCV: 69.6 fL — ABNORMAL LOW (ref 80.0–100.0)
NRBC: 0 % (ref 0.0–0.2)
PLATELETS: 180 10*3/uL (ref 150–400)
RBC: 3.95 MIL/uL (ref 3.87–5.11)
RDW: 21.1 % — ABNORMAL HIGH (ref 11.5–15.5)
WBC: 10.9 10*3/uL — ABNORMAL HIGH (ref 4.0–10.5)

## 2018-06-27 LAB — LIPID PANEL
CHOLESTEROL: 115 mg/dL (ref 0–200)
HDL: 45 mg/dL (ref >40)
LDL Cholesterol: 52 mg/dL (ref 0–99)
Total CHOL/HDL Ratio: 2.6 RATIO
Triglycerides: 89 mg/dL (ref <150)
VLDL: 18 mg/dL (ref 0–40)

## 2018-06-27 LAB — COMPREHENSIVE METABOLIC PANEL
ALK PHOS: 84 U/L (ref 38–126)
ALT: 12 U/L (ref 0–44)
ANION GAP: 7 (ref 5–15)
AST: 19 U/L (ref 15–41)
Albumin: 2.6 g/dL — ABNORMAL LOW (ref 3.5–5.0)
BILIRUBIN TOTAL: 0.9 mg/dL (ref 0.3–1.2)
BUN: 13 mg/dL (ref 8–23)
CALCIUM: 8.6 mg/dL — AB (ref 8.9–10.3)
CO2: 21 mmol/L — ABNORMAL LOW (ref 22–32)
Chloride: 111 mmol/L (ref 98–111)
Creatinine, Ser: 1.14 mg/dL — ABNORMAL HIGH (ref 0.44–1.00)
GFR calc Af Amer: 54 mL/min — ABNORMAL LOW (ref >60)
GFR calc non Af Amer: 47 mL/min — ABNORMAL LOW (ref >60)
Glucose, Bld: 90 mg/dL (ref 70–99)
Potassium: 4.3 mmol/L (ref 3.5–5.1)
SODIUM: 139 mmol/L (ref 135–145)
Total Protein: 6.1 g/dL — ABNORMAL LOW (ref 6.5–8.1)

## 2018-06-27 LAB — MAGNESIUM: MAGNESIUM: 2.5 mg/dL — AB (ref 1.7–2.4)

## 2018-06-27 NOTE — Progress Notes (Signed)
PROGRESS NOTE                                                                                                                                                                                                             Patient Demographics:    Heather Lutz, is a 72 y.o. female, DOB - 29-Apr-1946, WGN:[id number]  Admit date - 06/25/2018   Admitting Physician Shelbie Proctor, MD  Outpatient Primary MD for the patient is [name]., MD  LOS - 0  CC - Malaise     Brief Narrative   Heather Lutz  is a 72 y.o. female, w [name], Multiple Sclerosis apparently presents with c/o numbness in her legs as well as achiness and weakness since this past Friday. Pt notes fever today and increase in frequency of urination and therefore presented to ED for evaluation.    Subjective:   Patient in bed, appears comfortable, denies any headache, no fever, no chest pain or pressure, no shortness of breath , no abdominal pain. No focal weakness.   Assessment  & Plan :   1.  Sepsis with Enterococcus bacteremia and UTI.  Sepsis physiology has resolved, cultures noted she is currently on IV ampicillin, repeat surveillance cultures ordered on 06/27/2018, ID on board, will follow repeat surveillance cultures thereafter if needed PICC line will be placed, likely IV antibiotics for the next 7 to 10 days minimum.  Monitor repeat culture results.  2.  HX of MS.  Bedbound.  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  Good strength in upper extremities.  Supportive care and PT.  3.  HX of lung nodule.  Nonspecific CT chest and CT head findings.  Follow with PCP for age-appropriate follow-up.  4.  Iron deficiency anemia.  Worse due to him dilution, no signs of active bleeding, placed on PPI, IV iron and oral iron supplementation, age-appropriate outpatient PCP follow-up. Discharge on iron and PPI, stop naproxen on discharge.  5.  Severe hypomagnesemia, hypocalcemia and  hypokalemia.  All replaced.  6.  Nonspecific rise in troponin.  Trend flat and and non-ACS pattern, mild troponin bump likely due to bacteremia and sepsis, chest pain-free with nonacute EKG (partial to complete left bundle branch block consistent with old EKGs but with mild progression).  Echocardiogram noted with a preserved EF of 65% and no wall motion abnormalities.  On low-dose beta-blocker and aspirin.    Family Communication  :  None  Code Status :  Full  Disposition Plan  :  TBD  Consults  :  None  Procedures  :    CT head & CT angiogram chest.  Both nonacute.  Lower extremity venous duplex.  No DVT SVT.  TTE  - LVEF 65-70%, moderate LVH, normal wall motion, grade 1 DD, indeterminate LV filling pressure, trivial MR, normal LA size, trivial TR, RVSP 28 mmHg, normal IVC.  DVT Prophylaxis  :  Lovenox    Lab Results  Component Value Date   PLT 180 06/27/2018    Diet :  Diet Order            Diet regular Room service appropriate? Yes; Fluid consistency: Thin  Diet effective now               Inpatient Medications Scheduled Meds: . aspirin  81 mg Oral Daily  . carvedilol  3.125 mg Oral BID WC  . enoxaparin (LOVENOX) injection  40 mg Subcutaneous Q24H  . ferrous sulfate  325 mg Oral TID WC  . Influenza vac split quadrivalent PF  0.5 mL Intramuscular Tomorrow-1000  . pantoprazole  40 mg Oral Daily  . polyethylene glycol  17 g Oral Daily  . [START ON 06/28/2018] Vitamin D (Ergocalciferol)  50,000 Units Oral Q7 days   Continuous Infusions: . ampicillin (OMNIPEN) IV 2 g (06/27/18 1039)   PRN Meds:.acetaminophen **OR** [DISCONTINUED] acetaminophen, naproxen  Antibiotics  :   Anti-infectives (From admission, onward)   Start     Dose/Rate Route Frequency Ordered Stop   06/26/18 1800  ampicillin (OMNIPEN) 2 g in sodium chloride 0.9 % 100 mL IVPB     2 g 300 mL/hr over 20 Minutes Intravenous Every 4 hours 06/26/18 1713     06/26/18 1030  vancomycin (VANCOCIN) IVPB  750 mg/150 ml premix  Status:  Discontinued     750 mg 150 mL/hr over 60 Minutes Intravenous Every 12 hours 06/25/18 2203 06/26/18 1713   06/25/18 2215  vancomycin (VANCOCIN) 1,500 mg in sodium chloride 0.9 % 500 mL IVPB     1,500 mg 250 mL/hr over 120 Minutes Intravenous  Once 06/25/18 2203 06/26/18 0156   06/25/18 2215  ceFEPIme (MAXIPIME) 2 g in sodium chloride 0.9 % 100 mL IVPB  Status:  Discontinued     2 g 200 mL/hr over 30 Minutes Intravenous Every 12 hours 06/25/18 2203 06/26/18 1712   06/25/18 1815  piperacillin-tazobactam (ZOSYN) IVPB 3.375 g     3.375 g 100 mL/hr over 30 Minutes Intravenous  Once 06/25/18 1809 06/25/18 1949          Objective:   Vitals:   06/26/18 2145 06/27/18 0541 06/27/18 0600 06/27/18 0930  BP: (!) 122/57 122/70  132/61  Pulse: 95 89  91  Resp: 16 12    Temp: 99 F (37.2 C) 97.9 F (36.6 C)    TempSrc: Oral Oral    SpO2: 95% 94%    Weight:   81 kg   Height:        Wt Readings from Last 3 Encounters:  06/27/18 81 kg  09/21/17 77.1 kg  02/03/16 78.5 kg     Intake/Output Summary (Last 24 hours) at 06/27/2018 1109 Last data filed at 06/27/2018 0700 Gross per 24 hour  Intake 470 ml  Output 3200 ml  Net -2730 ml     Physical Exam  Awake Alert, Oriented X 3, No new F.N deficits, chronic bilateral lower extremity weakness with strength 1/5, chronic legal blindness, 5/5 strength in upper extremities, normal affect [place].AT,PERRAL Supple Neck,No  JVD, No cervical lymphadenopathy appriciated.  Symmetrical Chest wall movement, Good air movement bilaterally, CTAB RRR,No Gallops, Rubs or new Murmurs, No Parasternal Heave +ve B.Sounds, Abd Soft, No tenderness, No organomegaly appriciated, No rebound - guarding or rigidity. No Cyanosis, Clubbing or edema, No new Rash or bruise     Data Review:    CBC Recent Labs  Lab 06/25/18 1554 06/26/18 0352 06/27/18 0418  WBC 13.7* 15.0* 10.9*  HGB 9.7* 7.5* 8.1*  HCT 31.8* 25.1* 27.5*  PLT 204  179 180  MCV 70.5* 70.3* 69.6*  MCH 21.5* 21.0* 20.5*  MCHC 30.5 29.9* 29.5*  RDW 20.8* 20.8* 21.1*  LYMPHSABS 0.8  --   --   MONOABS 1.1*  --   --   EOSABS 0.0  --   --   BASOSABS 0.0  --   --     Chemistries  Recent Labs  Lab 06/25/18 1554 06/26/18 0352 06/27/18 0418  NA 139 138 139  K 4.0 2.9* 4.3  CL 103 116* 111  CO2 23 18* 21*  GLUCOSE 101* 85 90  BUN 21 13 13   CREATININE 0.95 0.90 1.14*  CALCIUM 9.3 6.4* 8.6*  MG  --  1.2* 2.5*  AST 25 15 19   ALT 14 10 12   ALKPHOS 111 64 84  BILITOT 0.5 0.5 0.9   ------------------------------------------------------------------------------------------------------------------ Recent Labs    06/27/18 0418  CHOL 115  HDL 45  LDLCALC 52  TRIG 89  CHOLHDL 2.6    No results found for: HGBA1C ------------------------------------------------------------------------------------------------------------------ Recent Labs    06/25/18 2207  TSH 1.286   ------------------------------------------------------------------------------------------------------------------ Recent Labs    06/26/18 0352  VITAMINB12 194  FOLATE 7.4  FERRITIN 11  TIBC 343  IRON 9*    Coagulation profile Recent Labs  Lab 06/25/18 1554  INR 1.03    Recent Labs    06/25/18 2207  DDIMER 0.63*    Cardiac Enzymes Recent Labs  Lab 06/25/18 2207 06/26/18 0352 06/26/18 1119  TROPONINI 0.06* 0.10* 0.19*   ------------------------------------------------------------------------------------------------------------------    Component Value Date/Time   BNP 181.7 (H) 06/26/2018 0352    Micro Results Recent Results (from the past 240 hour(s))  Urine culture     Status: Abnormal (Preliminary result)   Collection Time: 06/25/18  3:00 PM  Result Value Ref Range Status   Specimen Description   Final    URINE, RANDOM Performed at [facility], [address] [place], [place] [address]    Special Requests   Final     NONE Performed at [facility], [address] [place], [place] [address]    Culture >=100,000 COLONIES/mL ESCHERICHIA COLI (A)  Final   Report Status PENDING  Incomplete  Culture, blood (routine x 2)     Status: None (Preliminary result)   Collection Time: 06/25/18  6:20 PM  Result Value Ref Range Status   Specimen Description   Final    BLOOD RIGHT ARM Performed at [facility], [address] [place], [place] [address]    Special Requests   Final    BOTTLES DRAWN AEROBIC AND ANAEROBIC Blood Culture results may not be optimal due to an inadequate volume of blood received in culture bottles Performed at [facility], [address] [place], [place] [address]    Culture  Setup Time   Final    GRAM POSITIVE COCCI IN CHAINS IN BOTH AEROBIC AND ANAEROBIC BOTTLES CRITICAL VALUE NOTED.  VALUE  IS CONSISTENT WITH PREVIOUSLY REPORTED AND CALLED VALUE. Performed at [facility], [place] [address], [place], [place] [address]    Culture GRAM POSITIVE COCCI IN CHAINS  Final   Report Status PENDING  Incomplete  Culture, blood (routine x 2)     Status: None (Preliminary result)   Collection Time: 06/25/18  6:28 PM  Result Value Ref Range Status   Specimen Description   Final    BLOOD LEFT ARM Performed at [facility], [address] [place], [place] [address]    Special Requests   Final    BOTTLES DRAWN AEROBIC AND ANAEROBIC Blood Culture adequate volume Performed at [facility], [address] [place], [place] [address]    Culture  Setup Time   Final    GRAM POSITIVE COCCI IN CHAINS IN BOTH AEROBIC AND ANAEROBIC BOTTLES CRITICAL RESULT CALLED TO, READ BACK BY AND VERIFIED WITH: Alesia Morin [id number] MLM Performed at [facility], [place] [address], [place], [place] [address]    Culture GRAM POSITIVE COCCI IN CHAINS  Final   Report Status PENDING  Incomplete  Blood Culture ID Panel (Reflexed)     Status: Abnormal    Collection Time: 06/25/18  6:28 PM  Result Value Ref Range Status   Enterococcus species DETECTED (A) NOT DETECTED Final    Comment: CRITICAL RESULT CALLED TO, READ BACK BY AND VERIFIED WITH: PHARMD T EGAN [id number] MLM    Vancomycin resistance NOT DETECTED NOT DETECTED Final   Listeria monocytogenes NOT DETECTED NOT DETECTED Final   Staphylococcus species NOT DETECTED NOT DETECTED Final   Staphylococcus aureus (BCID) NOT DETECTED NOT DETECTED Final   Methicillin resistance NOT DETECTED NOT DETECTED Final   Streptococcus species NOT DETECTED NOT DETECTED Final   Streptococcus agalactiae NOT DETECTED NOT DETECTED Final   Streptococcus pneumoniae NOT DETECTED NOT DETECTED Final   Streptococcus pyogenes NOT DETECTED NOT DETECTED Final   Acinetobacter baumannii NOT DETECTED NOT DETECTED Final   Enterobacteriaceae species NOT DETECTED NOT DETECTED Final   Enterobacter cloacae complex NOT DETECTED NOT DETECTED Final   Escherichia coli NOT DETECTED NOT DETECTED Final   Klebsiella oxytoca NOT DETECTED NOT DETECTED Final   Klebsiella pneumoniae NOT DETECTED NOT DETECTED Final   Proteus species NOT DETECTED NOT DETECTED Final   Serratia marcescens NOT DETECTED NOT DETECTED Final   Carbapenem resistance NOT DETECTED NOT DETECTED Final   Haemophilus influenzae NOT DETECTED NOT DETECTED Final   Neisseria meningitidis NOT DETECTED NOT DETECTED Final   Pseudomonas aeruginosa NOT DETECTED NOT DETECTED Final   Candida albicans NOT DETECTED NOT DETECTED Final   Candida glabrata NOT DETECTED NOT DETECTED Final   Candida krusei NOT DETECTED NOT DETECTED Final   Candida parapsilosis NOT DETECTED NOT DETECTED Final   Candida tropicalis NOT DETECTED NOT DETECTED Final    Comment: Performed at [facility], [address], [place], [place] [address]  Culture, blood (Routine X 2) w Reflex to ID Panel     Status: None (Preliminary result)   Collection Time: 06/27/18  4:18 AM  Result Value Ref Range  Status   Specimen Description   Final    BLOOD RIGHT HAND Performed at [facility], [place] [address] [place], [place] [address]    Special Requests   Final    BOTTLES DRAWN AEROBIC AND ANAEROBIC Blood Culture results may not be optimal due to an excessive volume of blood received  in culture bottles   Culture PENDING  Incomplete   Report Status PENDING  Incomplete    Radiology Reports Ct Head Wo Contrast  Result Date: 06/25/2018 CLINICAL DATA:  Neurologic deficit, subacute. Worsening muscle spasms. EXAM: CT HEAD WITHOUT CONTRAST TECHNIQUE: Contiguous axial images were obtained from the base of the skull through the vertex without intravenous contrast. COMPARISON:  None. FINDINGS: Brain: No evidence of acute infarction, hemorrhage, hydrocephalus, extra-axial collection or mass lesion/mass effect. There is mild diffuse low-attenuation within the subcortical and periventricular white matter compatible with chronic microvascular disease. Vascular: No hyperdense vessel or unexpected calcification. Skull: Normal. Negative for fracture or focal lesion. Sinuses/Orbits: The paranasal sinuses and mastoid air cells are clear. Within the anterior left globe there is a 2 mm calcific or metallic foreign body of uncertain etiology. A normal appearing left lens is not visualized and this may be postsurgical. Other: None IMPRESSION: 1. No acute intracranial abnormalities. 2. Chronic small vessel ischemic change. 3. Small radiopaque foreign body within the anterior left globe is identified of uncertain clinical significance. This may be postsurgical or posttraumatic in etiology. Clinical correlation is advised. Electronically Signed   By: Kerby Moors M.D.   On: 06/25/2018 17:08   Ct Angio Chest Pe W Or Wo Contrast  Result Date: 06/26/2018 CLINICAL DATA:  72 year old female with history of weakness in her extremities. Positive D-dimer. EXAM: CT ANGIOGRAPHY CHEST WITH CONTRAST TECHNIQUE: Multidetector CT  imaging of the chest was performed using the standard protocol during bolus administration of intravenous contrast. Multiplanar CT image reconstructions and MIPs were obtained to evaluate the vascular anatomy. CONTRAST:  166mL ISOVUE-370 IOPAMIDOL (ISOVUE-370) INJECTION 76% COMPARISON:  Chest CT 11/17/2014. FINDINGS: Cardiovascular: No filling defects in the pulmonary arterial tree to suggest underlying pulmonary embolism. Heart size is borderline enlarged. There is no significant pericardial fluid, thickening or pericardial calcification. There is aortic atherosclerosis, as well as atherosclerosis of the great vessels of the mediastinum and the coronary arteries, including calcified atherosclerotic plaque in the left anterior descending and right coronary arteries. Mediastinum/Nodes: No pathologically enlarged mediastinal or hilar lymph nodes. Large hiatal hernia. No axillary lymphadenopathy. Lungs/Pleura: 5 mm right upper lobe nodule (axial image 54 of series 7), unchanged compared to 2016, considered definitively benign. No other suspicious pulmonary nodules or masses are noted. Chronic scarring in the lower lobes of the lungs bilaterally, similar to the prior examination from 2016. No acute consolidative airspace disease. No pleural effusions. Upper Abdomen: Unremarkable. Musculoskeletal: There are no aggressive appearing lytic or blastic lesions noted in the visualized portions of the skeleton. Review of the MIP images confirms the above findings. IMPRESSION: 1. No evidence of pulmonary embolism. 2. No acute findings in the thorax to account for the patient's symptoms. 3. Large hiatal hernia. 4. Aortic atherosclerosis, in addition to 2 vessel coronary artery disease. Assessment for potential risk factor modification, dietary therapy or pharmacologic therapy may be warranted, if clinically indicated. Aortic Atherosclerosis (ICD10-I70.0). Electronically Signed   By: Vinnie Langton M.D.   On: 06/26/2018 02:42    US Venous Img Lower Bilateral  Result Date: 06/25/2018 CLINICAL DATA:  Bilateral lower extremity swelling with right thigh pain EXAM: BILATERAL LOWER EXTREMITY VENOUS DOPPLER ULTRASOUND TECHNIQUE: Gray-scale sonography with graded compression, as well as color Doppler and duplex ultrasound were performed to evaluate the lower extremity deep venous systems from the level of the common femoral vein and including the common femoral, femoral, profunda femoral, popliteal and calf veins including the posterior tibial, peroneal and gastrocnemius veins  when visible. The superficial great saphenous vein was also interrogated. Spectral Doppler was utilized to evaluate flow at rest and with distal augmentation maneuvers in the common femoral, femoral and popliteal veins. COMPARISON:  None. FINDINGS: RIGHT LOWER EXTREMITY Common Femoral Vein: No evidence of thrombus. Normal compressibility, respiratory phasicity and response to augmentation. Saphenofemoral Junction: No evidence of thrombus. Normal compressibility and flow on color Doppler imaging. Profunda Femoral Vein: No evidence of thrombus. Normal compressibility and flow on color Doppler imaging. Femoral Vein: No evidence of thrombus. Normal compressibility, respiratory phasicity and response to augmentation. Popliteal Vein: No evidence of thrombus. Normal compressibility, respiratory phasicity and response to augmentation. Calf Veins: No evidence of thrombus. Normal compressibility and flow on color Doppler imaging. Superficial Great Saphenous Vein: No evidence of thrombus. Normal compressibility. Venous Reflux:  None. Other Findings:  None. LEFT LOWER EXTREMITY Common Femoral Vein: No evidence of thrombus. Normal compressibility, respiratory phasicity and response to augmentation. Saphenofemoral Junction: No evidence of thrombus. Normal compressibility and flow on color Doppler imaging. Profunda Femoral Vein: No evidence of thrombus. Normal compressibility and  flow on color Doppler imaging. Femoral Vein: No evidence of thrombus. Normal compressibility, respiratory phasicity and response to augmentation. Popliteal Vein: No evidence of thrombus. Normal compressibility, respiratory phasicity and response to augmentation. Calf Veins: No evidence of thrombus. Normal compressibility and flow on color Doppler imaging. Superficial Great Saphenous Vein: No evidence of thrombus. Normal compressibility. Venous Reflux:  None. Other Findings:  None. IMPRESSION: No evidence of deep venous thrombosis in either lower extremity. Electronically Signed   By: Ilona Sorrel M.D.   On: 06/25/2018 18:07   Dg Chest Port 1 View  Result Date: 06/26/2018 CLINICAL DATA:  Shortness of breath. EXAM: PORTABLE CHEST 1 VIEW COMPARISON:  CT chest from same day.  Chest x-ray from yesterday. FINDINGS: The heart size and mediastinal contours are within normal limits. Normal pulmonary vascularity. Unchanged linear scarring at the left lung base. No focal consolidation, pleural effusion, or pneumothorax. No acute osseous abnormality. Unchanged moderate hiatal hernia. IMPRESSION: No active disease. Electronically Signed   By: Titus Dubin M.D.   On: 06/26/2018 16:54   Dg Chest Port 1 View  Result Date: 06/25/2018 CLINICAL DATA:  Generalized pain EXAM: PORTABLE CHEST 1 VIEW COMPARISON:  09/21/2017 chest radiograph. FINDINGS: Stable cardiomediastinal silhouette with normal heart size and moderate hiatal hernia. No pneumothorax. No pleural effusion. Stable minimal left basilar scarring versus atelectasis. No pulmonary edema. No acute consolidative airspace disease. IMPRESSION: 1. Minimal scarring versus atelectasis at the left lung base. Otherwise no active cardiopulmonary disease. 2. Moderate hiatal hernia. Electronically Signed   By: Ilona Sorrel M.D.   On: 06/25/2018 17:03    Time Spent in minutes  30   Lala Lund M.D on 06/27/2018 at 11:09 AM  To page go to [email or web address] - password  [facility]

## 2018-06-28 ENCOUNTER — Inpatient Hospital Stay: Payer: Self-pay

## 2018-06-28 ENCOUNTER — Inpatient Hospital Stay (HOSPITAL_COMMUNITY): Payer: Medicare Other

## 2018-06-28 LAB — COMPREHENSIVE METABOLIC PANEL
ALBUMIN: 2.4 g/dL — AB (ref 3.5–5.0)
ALT: 11 U/L (ref 0–44)
AST: 17 U/L (ref 15–41)
Alkaline Phosphatase: 80 U/L (ref 38–126)
Anion gap: 8 (ref 5–15)
BILIRUBIN TOTAL: 0.5 mg/dL (ref 0.3–1.2)
BUN: 13 mg/dL (ref 8–23)
CO2: 24 mmol/L (ref 22–32)
Calcium: 8.8 mg/dL — ABNORMAL LOW (ref 8.9–10.3)
Chloride: 109 mmol/L (ref 98–111)
Creatinine, Ser: 0.88 mg/dL (ref 0.44–1.00)
GFR calc Af Amer: >60 mL/min (ref >60)
GFR calc non Af Amer: >60 mL/min (ref >60)
GLUCOSE: 86 mg/dL (ref 70–99)
POTASSIUM: 3.7 mmol/L (ref 3.5–5.1)
Sodium: 141 mmol/L (ref 135–145)
TOTAL PROTEIN: 6.3 g/dL — AB (ref 6.5–8.1)

## 2018-06-28 LAB — CULTURE, BLOOD (ROUTINE X 2): Special Requests: ADEQUATE

## 2018-06-28 LAB — URINE CULTURE: Culture: >=100000 — AB

## 2018-06-28 MED ORDER — IOPAMIDOL (ISOVUE-370) INJECTION 76%
100.0000 mL | Freq: Once | INTRAVENOUS | Status: AC | PRN
  Administered 2018-06-28: 100 mL via INTRAVENOUS

## 2018-06-28 MED ORDER — BISACODYL 10 MG RE SUPP
10.0000 mg | Freq: Every day | RECTAL | Status: DC
  Administered 2018-06-28: 10 mg via RECTAL
  Filled 2018-06-28 (×3): qty 1

## 2018-06-28 MED ORDER — POLYETHYLENE GLYCOL 3350 17 G PO PACK
17.0000 g | PACK | Freq: Two times a day (BID) | ORAL | Status: DC
  Filled 2018-06-28 (×3): qty 1

## 2018-06-28 MED ORDER — SODIUM CHLORIDE 0.9% FLUSH: 10.0000 mL | INTRAVENOUS | Status: DC | PRN

## 2018-06-28 NOTE — Progress Notes (Signed)
PROGRESS NOTE                                                                                                                                                                                                             Patient Demographics:    Heather Lutz, is a 72 y.o. female, DOB - [birth year], FYB:[id number]  Admit date - 06/25/2018   Admitting Physician Shelbie Proctor, MD  Outpatient Primary MD for the patient is [name]., MD  LOS - 1  CC - [place]     Brief Narrative   Heather Lutz  is a 72 y.o. female, w [name], Multiple Sclerosis apparently presents with c/o numbness in her legs as well as achiness and weakness since this past Friday. Pt notes fever today and increase in frequency of urination and therefore presented to ED for evaluation.    Subjective:   Patient in bed, appears comfortable, denies any headache, no fever, no chest pain or pressure, no shortness of breath , mild RLQ abdominal pain. No focal weakness.    Assessment  & Plan :   1. Sepsis with Enterococcus bacteremia and E.Coli UTI.  Sepsis pathophysiology has resolved, she is currently on IV ampicillin with a stop date of 07/04/2018, will be switched to amoxicillin for a week on 07/05/2018 likely in SNF, will place a midline on 06/28/2018, repeat surveillance cultures are negative thus far, transthoracic echo stable.  Enterococcus source not clear will check CT scan abdomen pelvis to rule out any ongoing GI source, overall improving, if continues to be better discharge to SNF on 06/29/2018.  Seen by ID.  2.  HX of MS.  Bedbound.  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  Good strength in upper extremities.  Supportive care and PT.  3.  HX of lung nodule.  Nonspecific CT chest and CT head findings.  Follow with PCP for age-appropriate follow-up.  4.  Iron deficiency anemia.  Worse due to him dilution, no signs of active bleeding, placed on PPI, IV iron and oral  iron supplementation, age-appropriate outpatient PCP follow-up. Discharge on iron and PPI, stop naproxen on discharge.  5.  Severe hypomagnesemia, hypocalcemia and hypokalemia.  All replaced.  6.  Nonspecific rise in troponin.  Trend flat and and non-ACS pattern, mild troponin bump likely due to bacteremia and sepsis, chest pain-free with nonacute EKG (partial to complete left bundle branch block consistent with old EKGs but with mild progression).  Echocardiogram noted with a preserved EF  of 65% and no wall motion abnormalities.  On low-dose beta-blocker and aspirin.  7.  Mild right lower quadrant abdominal pain.  Acute on chronic.  Abdominal x-ray unremarkable, does have unknown source enterococcus bacteremia, will check CT scan abdomen pelvis with oral and IV contrast and monitor.    Family Communication  :  None  Code Status :  Full  Disposition Plan  :  TBD  Consults  :  None  Procedures  :    CT head & CT angiogram chest.  Both nonacute.  Lower extremity venous duplex.  No DVT SVT.  TTE  - LVEF 65-70%, moderate LVH, normal wall motion, grade 1 DD, indeterminate LV filling pressure, trivial MR, normal LA size, trivial TR, RVSP 28 mmHg, normal IVC.  DVT Prophylaxis  :  Lovenox    Lab Results  Component Value Date   PLT 180 06/27/2018    Diet :  Diet Order            Diet regular Room service appropriate? Yes; Fluid consistency: Thin  Diet effective now               Inpatient Medications Scheduled Meds: . aspirin  81 mg Oral Daily  . carvedilol  3.125 mg Oral BID WC  . enoxaparin (LOVENOX) injection  40 mg Subcutaneous Q24H  . ferrous sulfate  325 mg Oral TID WC  . Influenza vac split quadrivalent PF  0.5 mL Intramuscular Tomorrow-1000  . pantoprazole  40 mg Oral Daily  . polyethylene glycol  17 g Oral Daily  . Vitamin D (Ergocalciferol)  50,000 Units Oral Q7 days   Continuous Infusions: . ampicillin (OMNIPEN) IV 2 g (06/28/18 0957)   PRN  Meds:.acetaminophen **OR** [DISCONTINUED] acetaminophen, naproxen  Antibiotics  :   Anti-infectives (From admission, onward)   Start     Dose/Rate Route Frequency Ordered Stop   06/26/18 1800  ampicillin (OMNIPEN) 2 g in sodium chloride 0.9 % 100 mL IVPB     2 g 300 mL/hr over 20 Minutes Intravenous Every 4 hours 06/26/18 1713     06/26/18 1030  vancomycin (VANCOCIN) IVPB 750 mg/150 ml premix  Status:  Discontinued     750 mg 150 mL/hr over 60 Minutes Intravenous Every 12 hours 06/25/18 2203 06/26/18 1713   06/25/18 2215  vancomycin (VANCOCIN) 1,500 mg in sodium chloride 0.9 % 500 mL IVPB     1,500 mg 250 mL/hr over 120 Minutes Intravenous  Once 06/25/18 2203 06/26/18 0156   06/25/18 2215  ceFEPIme (MAXIPIME) 2 g in sodium chloride 0.9 % 100 mL IVPB  Status:  Discontinued     2 g 200 mL/hr over 30 Minutes Intravenous Every 12 hours 06/25/18 2203 06/26/18 1712   06/25/18 1815  piperacillin-tazobactam (ZOSYN) IVPB 3.375 g     3.375 g 100 mL/hr over 30 Minutes Intravenous  Once 06/25/18 1809 06/25/18 1949          Objective:   Vitals:   06/27/18 1425 06/27/18 1727 06/27/18 2123 06/28/18 0534  BP: 140/66 (!) 148/75 134/60 136/68  Pulse: 93 90 90 88  Resp: 14  18 18   Temp: 98.9 F (37.2 C)  98.2 F (36.8 C) 98 F (36.7 C)  TempSrc: Oral     SpO2: 97%  94% 97%  Weight:      Height:        Wt Readings from Last 3 Encounters:  06/27/18 81 kg  09/21/17 77.1 kg  02/03/16 78.5 kg  Intake/Output Summary (Last 24 hours) at 06/28/2018 1012 Last data filed at 06/27/2018 1900 Gross per 24 hour  Intake 120 ml  Output 600 ml  Net -480 ml     Physical Exam  Awake Alert, Oriented X 3, chronic bilateral lower extremity weakness with strength 1/5, chronic legal blindness, 5/5 strength in upper extremities, normal affect [place].AT,PERRAL Supple Neck,No JVD, No cervical lymphadenopathy appriciated.  Symmetrical Chest wall movement, Good air movement bilaterally, CTAB RRR,No  Gallops, Rubs or new Murmurs, No Parasternal Heave +ve B.Sounds, Abd Soft, No tenderness, No organomegaly appriciated, No rebound - guarding or rigidity. No Cyanosis, Clubbing or edema, No new Rash or bruise    Data Review:    CBC Recent Labs  Lab 06/25/18 1554 06/26/18 0352 06/27/18 0418  WBC 13.7* 15.0* 10.9*  HGB 9.7* 7.5* 8.1*  HCT 31.8* 25.1* 27.5*  PLT 204 179 180  MCV 70.5* 70.3* 69.6*  MCH 21.5* 21.0* 20.5*  MCHC 30.5 29.9* 29.5*  RDW 20.8* 20.8* 21.1*  LYMPHSABS 0.8  --   --   MONOABS 1.1*  --   --   EOSABS 0.0  --   --   BASOSABS 0.0  --   --     Chemistries  Recent Labs  Lab 06/25/18 1554 06/26/18 0352 06/27/18 0418 06/28/18 0602  NA 139 138 139 141  K 4.0 2.9* 4.3 3.7  CL 103 116* 111 109  CO2 23 18* 21* 24  GLUCOSE 101* 85 90 86  BUN 21 13 13 13   CREATININE 0.95 0.90 1.14* 0.88  CALCIUM 9.3 6.4* 8.6* 8.8*  MG  --  1.2* 2.5*  --   AST 25 15 19 17   ALT 14 10 12 11   ALKPHOS 111 64 84 80  BILITOT 0.5 0.5 0.9 0.5   ------------------------------------------------------------------------------------------------------------------ Recent Labs    06/27/18 0418  CHOL 115  HDL 45  LDLCALC 52  TRIG 89  CHOLHDL 2.6    No results found for: HGBA1C ------------------------------------------------------------------------------------------------------------------ Recent Labs    06/25/18 2207  TSH 1.286   ------------------------------------------------------------------------------------------------------------------ Recent Labs    06/26/18 0352  VITAMINB12 194  FOLATE 7.4  FERRITIN 11  TIBC 343  IRON 9*    Coagulation profile Recent Labs  Lab 06/25/18 1554  INR 1.03    Recent Labs    06/25/18 2207  DDIMER 0.63*    Cardiac Enzymes Recent Labs  Lab 06/25/18 2207 06/26/18 0352 06/26/18 1119  TROPONINI 0.06* 0.10* 0.19*    ------------------------------------------------------------------------------------------------------------------    Component Value Date/Time   BNP 181.7 (H) 06/26/2018 0352    Micro Results Recent Results (from the past 240 hour(s))  Urine culture     Status: Abnormal   Collection Time: 06/25/18  3:00 PM  Result Value Ref Range Status   Specimen Description   Final    URINE, RANDOM Performed at [facility], [address] [place], [place] [address]    Special Requests   Final    NONE Performed at [facility], [address] [place], [place] [address]    Culture >=100,000 COLONIES/mL ESCHERICHIA COLI (A)  Final   Report Status 06/28/2018 FINAL  Final   Organism ID, Bacteria ESCHERICHIA COLI (A)  Final      Susceptibility   Escherichia coli - MIC*    AMPICILLIN 8 SENSITIVE Sensitive     CEFAZOLIN <=4 SENSITIVE Sensitive     CEFTRIAXONE <=1 SENSITIVE Sensitive     CIPROFLOXACIN <=0.25 SENSITIVE Sensitive  GENTAMICIN <=1 SENSITIVE Sensitive     IMIPENEM <=0.25 SENSITIVE Sensitive     NITROFURANTOIN <=16 SENSITIVE Sensitive     TRIMETH/SULFA <=20 SENSITIVE Sensitive     AMPICILLIN/SULBACTAM 4 SENSITIVE Sensitive     PIP/TAZO <=4 SENSITIVE Sensitive     Extended ESBL NEGATIVE Sensitive     * >=100,000 COLONIES/mL ESCHERICHIA COLI  Culture, blood (routine x 2)     Status: Abnormal   Collection Time: 06/25/18  6:20 PM  Result Value Ref Range Status   Specimen Description   Final    BLOOD RIGHT ARM Performed at [facility], [address] [place], [place] [address]    Special Requests   Final    BOTTLES DRAWN AEROBIC AND ANAEROBIC Blood Culture results may not be optimal due to an inadequate volume of blood received in culture bottles Performed at [facility], [address] [place], [place] [address]    Culture  Setup Time   Final    GRAM POSITIVE COCCI IN CHAINS IN BOTH AEROBIC AND ANAEROBIC BOTTLES CRITICAL  VALUE NOTED.  VALUE IS CONSISTENT WITH PREVIOUSLY REPORTED AND CALLED VALUE.    Culture (A)  Final    ENTEROCOCCUS FAECALIS SUSCEPTIBILITIES PERFORMED ON PREVIOUS CULTURE WITHIN THE LAST 5 DAYS. Performed at [facility], [place] [address] [place], [place] [address]    Report Status 06/28/2018 FINAL  Final  Culture, blood (routine x 2)     Status: Abnormal   Collection Time: 06/25/18  6:28 PM  Result Value Ref Range Status   Specimen Description   Final    BLOOD LEFT ARM Performed at [facility], [address] [place], [place] [address]    Special Requests   Final    BOTTLES DRAWN AEROBIC AND ANAEROBIC Blood Culture adequate volume Performed at [facility], [address] [place], [place] [address]    Culture  Setup Time   Final    GRAM POSITIVE COCCI IN CHAINS IN BOTH AEROBIC AND ANAEROBIC BOTTLES CRITICAL RESULT CALLED TO, READ BACK BY AND VERIFIED WITH: Alesia Morin [id number] MLM Performed at [facility], [address] [place], [place] [address]    Culture ENTEROCOCCUS FAECALIS (A)  Final   Report Status 06/28/2018 FINAL  Final   Organism ID, Bacteria ENTEROCOCCUS FAECALIS  Final      Susceptibility   Enterococcus faecalis - MIC*    AMPICILLIN <=2 SENSITIVE Sensitive     VANCOMYCIN 1 SENSITIVE Sensitive     GENTAMICIN SYNERGY SENSITIVE Sensitive     * ENTEROCOCCUS FAECALIS  Blood Culture ID Panel (Reflexed)     Status: Abnormal   Collection Time: 06/25/18  6:28 PM  Result Value Ref Range Status   Enterococcus species DETECTED (A) NOT DETECTED Final    Comment: CRITICAL RESULT CALLED TO, READ BACK BY AND VERIFIED WITH: PHARMD T EGAN [id number] MLM    Vancomycin resistance NOT DETECTED NOT DETECTED Final   Listeria monocytogenes NOT DETECTED NOT DETECTED Final   Staphylococcus species NOT DETECTED NOT DETECTED Final   Staphylococcus aureus (BCID) NOT DETECTED NOT DETECTED Final   Methicillin resistance NOT DETECTED NOT DETECTED  Final   Streptococcus species NOT DETECTED NOT DETECTED Final   Streptococcus agalactiae NOT DETECTED NOT DETECTED Final   Streptococcus pneumoniae NOT DETECTED NOT DETECTED Final   Streptococcus pyogenes NOT DETECTED NOT DETECTED Final   Acinetobacter baumannii NOT DETECTED NOT DETECTED Final  Enterobacteriaceae species NOT DETECTED NOT DETECTED Final   Enterobacter cloacae complex NOT DETECTED NOT DETECTED Final   Escherichia coli NOT DETECTED NOT DETECTED Final   Klebsiella oxytoca NOT DETECTED NOT DETECTED Final   Klebsiella pneumoniae NOT DETECTED NOT DETECTED Final   Proteus species NOT DETECTED NOT DETECTED Final   Serratia marcescens NOT DETECTED NOT DETECTED Final   Carbapenem resistance NOT DETECTED NOT DETECTED Final   Haemophilus influenzae NOT DETECTED NOT DETECTED Final   Neisseria meningitidis NOT DETECTED NOT DETECTED Final   Pseudomonas aeruginosa NOT DETECTED NOT DETECTED Final   Candida albicans NOT DETECTED NOT DETECTED Final   Candida glabrata NOT DETECTED NOT DETECTED Final   Candida krusei NOT DETECTED NOT DETECTED Final   Candida parapsilosis NOT DETECTED NOT DETECTED Final   Candida tropicalis NOT DETECTED NOT DETECTED Final    Comment: Performed at [facility], [place] [address], [place], [place] [address]  Culture, blood (Routine X 2) w Reflex to ID Panel     Status: None (Preliminary result)   Collection Time: 06/27/18  4:18 AM  Result Value Ref Range Status   Specimen Description   Final    BLOOD RIGHT HAND Performed at [facility], [place] [address] [place], [place] [address]    Special Requests   Final    BOTTLES DRAWN AEROBIC AND ANAEROBIC Blood Culture results may not be optimal due to an excessive volume of blood received in culture bottles   Culture PENDING  Incomplete   Report Status PENDING  Incomplete    Radiology Reports Ct Head Wo Contrast  Result Date: 06/25/2018 CLINICAL DATA:  Neurologic deficit, subacute. Worsening muscle  spasms. EXAM: CT HEAD WITHOUT CONTRAST TECHNIQUE: Contiguous axial images were obtained from the base of the skull through the vertex without intravenous contrast. COMPARISON:  None. FINDINGS: Brain: No evidence of acute infarction, hemorrhage, hydrocephalus, extra-axial collection or mass lesion/mass effect. There is mild diffuse low-attenuation within the subcortical and periventricular white matter compatible with chronic microvascular disease. Vascular: No hyperdense vessel or unexpected calcification. Skull: Normal. Negative for fracture or focal lesion. Sinuses/Orbits: The paranasal sinuses and mastoid air cells are clear. Within the anterior left globe there is a 2 mm calcific or metallic foreign body of uncertain etiology. A normal appearing left lens is not visualized and this may be postsurgical. Other: None IMPRESSION: 1. No acute intracranial abnormalities. 2. Chronic small vessel ischemic change. 3. Small radiopaque foreign body within the anterior left globe is identified of uncertain clinical significance. This may be postsurgical or posttraumatic in etiology. Clinical correlation is advised. Electronically Signed   By: Kerby Moors M.D.   On: 06/25/2018 17:08   Ct Angio Chest Pe W Or Wo Contrast  Result Date: 06/26/2018 CLINICAL DATA:  72 year old female with history of weakness in her extremities. Positive D-dimer. EXAM: CT ANGIOGRAPHY CHEST WITH CONTRAST TECHNIQUE: Multidetector CT imaging of the chest was performed using the standard protocol during bolus administration of intravenous contrast. Multiplanar CT image reconstructions and MIPs were obtained to evaluate the vascular anatomy. CONTRAST:  133mL ISOVUE-370 IOPAMIDOL (ISOVUE-370) INJECTION 76% COMPARISON:  Chest CT 11/17/2014. FINDINGS: Cardiovascular: No filling defects in the pulmonary arterial tree to suggest underlying pulmonary embolism. Heart size is borderline enlarged. There is no significant pericardial fluid, thickening or  pericardial calcification. There is aortic atherosclerosis, as well as atherosclerosis of the great vessels of the mediastinum and the coronary arteries, including calcified atherosclerotic plaque in the left anterior descending and right coronary  arteries. Mediastinum/Nodes: No pathologically enlarged mediastinal or hilar lymph nodes. Large hiatal hernia. No axillary lymphadenopathy. Lungs/Pleura: 5 mm right upper lobe nodule (axial image 54 of series 7), unchanged compared to 2016, considered definitively benign. No other suspicious pulmonary nodules or masses are noted. Chronic scarring in the lower lobes of the lungs bilaterally, similar to the prior examination from 2016. No acute consolidative airspace disease. No pleural effusions. Upper Abdomen: Unremarkable. Musculoskeletal: There are no aggressive appearing lytic or blastic lesions noted in the visualized portions of the skeleton. Review of the MIP images confirms the above findings. IMPRESSION: 1. No evidence of pulmonary embolism. 2. No acute findings in the thorax to account for the patient's symptoms. 3. Large hiatal hernia. 4. Aortic atherosclerosis, in addition to 2 vessel coronary artery disease. Assessment for potential risk factor modification, dietary therapy or pharmacologic therapy may be warranted, if clinically indicated. Aortic Atherosclerosis (ICD10-I70.0). Electronically Signed   By: Vinnie Langton M.D.   On: 06/26/2018 02:42   US Venous Img Lower Bilateral  Result Date: 06/25/2018 CLINICAL DATA:  Bilateral lower extremity swelling with right thigh pain EXAM: BILATERAL LOWER EXTREMITY VENOUS DOPPLER ULTRASOUND TECHNIQUE: Gray-scale sonography with graded compression, as well as color Doppler and duplex ultrasound were performed to evaluate the lower extremity deep venous systems from the level of the common femoral vein and including the common femoral, femoral, profunda femoral, popliteal and calf veins including the posterior  tibial, peroneal and gastrocnemius veins when visible. The superficial great saphenous vein was also interrogated. Spectral Doppler was utilized to evaluate flow at rest and with distal augmentation maneuvers in the common femoral, femoral and popliteal veins. COMPARISON:  None. FINDINGS: RIGHT LOWER EXTREMITY Common Femoral Vein: No evidence of thrombus. Normal compressibility, respiratory phasicity and response to augmentation. Saphenofemoral Junction: No evidence of thrombus. Normal compressibility and flow on color Doppler imaging. Profunda Femoral Vein: No evidence of thrombus. Normal compressibility and flow on color Doppler imaging. Femoral Vein: No evidence of thrombus. Normal compressibility, respiratory phasicity and response to augmentation. Popliteal Vein: No evidence of thrombus. Normal compressibility, respiratory phasicity and response to augmentation. Calf Veins: No evidence of thrombus. Normal compressibility and flow on color Doppler imaging. Superficial Great Saphenous Vein: No evidence of thrombus. Normal compressibility. Venous Reflux:  None. Other Findings:  None. LEFT LOWER EXTREMITY Common Femoral Vein: No evidence of thrombus. Normal compressibility, respiratory phasicity and response to augmentation. Saphenofemoral Junction: No evidence of thrombus. Normal compressibility and flow on color Doppler imaging. Profunda Femoral Vein: No evidence of thrombus. Normal compressibility and flow on color Doppler imaging. Femoral Vein: No evidence of thrombus. Normal compressibility, respiratory phasicity and response to augmentation. Popliteal Vein: No evidence of thrombus. Normal compressibility, respiratory phasicity and response to augmentation. Calf Veins: No evidence of thrombus. Normal compressibility and flow on color Doppler imaging. Superficial Great Saphenous Vein: No evidence of thrombus. Normal compressibility. Venous Reflux:  None. Other Findings:  None. IMPRESSION: No evidence of deep  venous thrombosis in either lower extremity. Electronically Signed   By: Ilona Sorrel M.D.   On: 06/25/2018 18:07   Dg Chest Port 1 View  Result Date: 06/26/2018 CLINICAL DATA:  Shortness of breath. EXAM: PORTABLE CHEST 1 VIEW COMPARISON:  CT chest from same day.  Chest x-ray from yesterday. FINDINGS: The heart size and mediastinal contours are within normal limits. Normal pulmonary vascularity. Unchanged linear scarring at the left lung base. No focal consolidation, pleural effusion, or pneumothorax. No acute osseous abnormality. Unchanged moderate hiatal hernia. IMPRESSION: No  active disease. Electronically Signed   By: Titus Dubin M.D.   On: 06/26/2018 16:54   Dg Chest Port 1 View  Result Date: 06/25/2018 CLINICAL DATA:  Generalized pain EXAM: PORTABLE CHEST 1 VIEW COMPARISON:  09/21/2017 chest radiograph. FINDINGS: Stable cardiomediastinal silhouette with normal heart size and moderate hiatal hernia. No pneumothorax. No pleural effusion. Stable minimal left basilar scarring versus atelectasis. No pulmonary edema. No acute consolidative airspace disease. IMPRESSION: 1. Minimal scarring versus atelectasis at the left lung base. Otherwise no active cardiopulmonary disease. 2. Moderate hiatal hernia. Electronically Signed   By: Ilona Sorrel M.D.   On: 06/25/2018 17:03   Dg Abd Portable 1v  Result Date: 06/28/2018 CLINICAL DATA:  Abdominal pain. Hx of GERD. EXAM: PORTABLE ABDOMEN - 1 VIEW COMPARISON:  09/21/2017 FINDINGS: Stomach and small bowel are nondilated. There is gaseous distention of the colon. Rectum appears decompressed. Left pelvic phleboliths. Regional bones unremarkable. IMPRESSION: Nonobstructive bowel gas pattern mild gaseous distention of the colon. Electronically Signed   By: Lucrezia Europe M.D.   On: 06/28/2018 08:41    Time Spent in minutes  30   Lala Lund M.D on 06/28/2018 at 10:12 AM  To page go to [email or web address] - password [facility]

## 2018-06-28 NOTE — Progress Notes (Signed)
Called and spoke with [place] RN regarding midline order and POC for patient. This nurse was informed that patient was being discharged to a facility with continued ampicillin. Instructed nurse that patient needs a PICC line rather than a midline d/t the type of medication and DC plan. Instructed to notify MD. Caryl Comes RN VU. Fran Lowes, RN VAST

## 2018-06-28 NOTE — Progress Notes (Addendum)
Pharmacy Antibiotic Note  Heather Lutz is a 72 y.o. female admitted on 06/25/2018 with sepsis.  Pharmacy has been consulted for ampicillin dosing.  Plan: Continue ampicillin 2g IV q4h until 10/21.  Plan to switch to amoxicillin 500mg  PO q8h   Monitor Scr trend & adjust abx freqency as appropriate.  Monitor repeat Bcx & WBC.  Height: 5\' 3"  (160 cm) Weight: 178 lb 9.2 oz (81 kg) IBW/kg (Calculated) : 52.4  Temp (24hrs), Avg:98.4 F (36.9 C), Min:98 F (36.7 C), Max:98.9 F (37.2 C)  Recent Labs  Lab 06/25/18 1554 06/25/18 1840 06/26/18 0352 06/27/18 0418 06/28/18 0602  WBC 13.7*  --  15.0* 10.9*  --   CREATININE 0.95  --  0.90 1.14* 0.88  LATICACIDVEN  --  0.96  --   --   --     Estimated Creatinine Clearance: 57.3 mL/min (by C-G formula based on SCr of 0.88 mg/dL).    Allergies  Allergen Reactions  . Other Itching    Walnuts, honeydew, watermelon, canteloupe, bananas  . Epinephrine Anxiety and Other (See Comments)    Hallucinations     Antimicrobials this admission: Vanc  10/12>> 10/13 Zosyn x 1 >> 10/12 Cefepime 10/12>> 10/13 Ampicillin 2g 10/13 >> (10/21)  Dose adjustments this admission: N/A  Microbiology results: 10/12 blood x 2 - Enterococcus Faecalis 10/14 repeat blood culture x2 >> BCID 10/12 >> enterococcus 10/12 urine >> e coli pan S  Thank you for allowing pharmacy to be a part of this patient's care.  Ladoris Gene 06/28/2018 10:26 AM   I discussed / reviewed the pharmacy note by Dr. Nehemiah Settle (PharmD Candidate)  and I agree with the resident's findings and plans as documented. Will need to f/u  testing results for endocarditis for definitive abx plan.  Shemicka Cohrs S. Alford Highland, PharmD, [place] Clinical Staff Pharmacist [phone]

## 2018-06-28 NOTE — Clinical Social Work Note (Signed)
Clinical Social Work Assessment  Patient Details  Name: Heather Lutz MRN: [id number] Date of Birth: 03-10-1946  Date of referral:  06/28/18               Reason for consult:  Facility Placement, Discharge Planning                Permission sought to share information with:  Family Supports Permission granted to share information::  Yes, Verbal Permission Granted  Name::     Mikenzi Raysor  Agency::  [facility]  Relationship::  spouse  Contact Information:  [phone]  Housing/Transportation Living arrangements for the past 2 months:  Single Family Home Source of Information:  Spouse Patient Interpreter Needed:  None Criminal Activity/Legal Involvement Pertinent to Current Situation/Hospitalization:  No - Comment as needed Significant Relationships:  Adult Children, Spouse Lives with:  Spouse Do you feel safe going back to the place where you live?  Yes Need for family participation in patient care:  Yes (Comment)  Care giving concerns:  CSW received phone call from [place] secretary stating patients husband was in the office wanting to speak to CSW about discharge plan.   Social Worker assessment / plan:  CSW spoke to patients husband Maritssa Haughton in [place] office (patients husband works as a Clinical biochemist for [employer]). Ray stated that he will be unable to provide 24 hours care to his wife at home and believes a SNF would be a better venue for patient. Ray stated he would like patient to go to [facility] since patient has been at facility in the past and it will be close to home. CSW to reach out to facility to make them aware of family's choice. Facility stated they will offer bed to patient and start authorization.   Employment status:  Retired Nurse, adult PT Recommendations:  [place] / Referral to community resources:  [place]  Patient/Family's Response to care:  Husband appreciates CSW role  in care  Patient/Family's Understanding of and Emotional Response to Diagnosis, Current Treatment, and Prognosis:  Patient will discharge to [facility] once medically stable   Emotional Assessment Appearance:  Appears stated age Attitude/Demeanor/Rapport:  Unable to Assess Affect (typically observed):  Unable to Assess Orientation:  Oriented to Self, Oriented to Situation, Oriented to  Time, Oriented to Place Alcohol / Substance use:  Not Applicable Psych involvement (Current and /or in the community):  No (Comment)  Discharge Needs  Concerns to be addressed:  Care Coordination Readmission within the last 30 days:  No Current discharge risk:  Dependent with Mobility Barriers to Discharge:  Continued Medical Work up, Com[place], LCSW 06/28/2018, 2:09 PM

## 2018-06-28 NOTE — Progress Notes (Signed)
Peripherally Inserted Central Catheter/Midline Placement  The IV Nurse has discussed with the patient and/or persons authorized to consent for the patient, the purpose of this procedure and the potential benefits and risks involved with this procedure.  The benefits include less needle sticks, lab draws from the catheter, and the patient may be discharged home with the catheter. Risks include, but not limited to, infection, bleeding, blood clot (thrombus formation), and puncture of an artery; nerve damage and irregular heartbeat and possibility to perform a PICC exchange if needed/ordered by physician.  Alternatives to this procedure were also discussed.  Bard Power PICC patient education guide, fact sheet on infection prevention and patient information card has been provided to patient /or left at bedside.    PICC/Midline Placement Documentation        Heather Lutz 06/28/2018, 7:13 PM

## 2018-06-28 NOTE — NC FL2 (Signed)
[place] LEVEL OF CARE SCREENING TOOL     IDENTIFICATION  Patient Name: Heather Lutz Birthdate: 07-27-1946 Sex: female Admission Date (Current Location): 06/25/2018  [facility] and [place] Number:  Herbalist and Address:  [facility], [place] [address] [place],  [address]      Provider Number: [id number]  Attending Physician Name and Address:  Thurnell Lose, MD  Relative Name and Phone Number:  Sherrilynn Gudgel, [phone]    Current Level of Care: Hospital Recommended Level of Care: [place] Prior Approval Number:    Date Approved/Denied:   PASRR Number: [id number]  Discharge Plan: SNF    Current Diagnoses: Patient Active Problem List   Diagnosis Date Noted  . Acute lower UTI 06/25/2018  . Sepsis ([place]) 06/25/2018  . Acute cystitis with hematuria   . Venous stasis 11/20/2014  . Lung nodule < 6cm on CT 11/20/2014  . Left lower lobe pneumonia ([place]) 11/17/2014  . Influenza A (H1N1) 11/06/2012  . Hematemesis 11/06/2012  . CAP (community acquired pneumonia) 11/05/2012  . Hypoxemia 11/05/2012  . SOB (shortness of breath) 11/05/2012  . Multiple sclerosis ([place]) 11/05/2012    Orientation RESPIRATION BLADDER Height & Weight     Self, Time, Situation, Place  Normal Incontinent Weight: 178 lb 9.2 oz (81 kg) Height:  5\' 3"  (160 cm)  BEHAVIORAL SYMPTOMS/MOOD NEUROLOGICAL BOWEL NUTRITION STATUS      Continent Diet(regular)  AMBULATORY STATUS COMMUNICATION OF NEEDS Skin   Extensive Assist Verbally                         Personal Care Assistance Level of Assistance  Bathing, Feeding, Dressing Bathing Assistance: Maximum assistance Feeding assistance: Independent Dressing Assistance: Maximum assistance     Functional Limitations Info  Sight, Hearing, Speech Sight Info: Impaired Hearing Info: Adequate Speech Info: Adequate    SPECIAL CARE FACTORS FREQUENCY  PT (By licensed PT),  OT (By licensed OT)     PT Frequency: 5x wk OT Frequency: 5x wk            Contractures Contractures Info: Not present    Additional Factors Info  Code Status, Allergies Code Status Info: full code Allergies Info: OTHER, EPINEPHRINE           Current Medications (06/28/2018):  This is the current hospital active medication list Current Facility-Administered Medications  Medication Dose Route Frequency Provider Last Rate Last Dose  . acetaminophen (TYLENOL) tablet 650 mg  650 mg Oral Q6H PRN Jani Gravel, MD   650 mg at 06/28/18 0950  . ampicillin (OMNIPEN) 2 g in sodium chloride 0.9 % 100 mL IVPB  2 g Intravenous Q4H Skeet Simmer, RPH 300 mL/hr at 06/28/18 0957 2 g at 06/28/18 0957  . aspirin chewable tablet 81 mg  81 mg Oral Daily Thurnell Lose, MD   81 mg at 06/28/18 0955  . bisacodyl (DULCOLAX) suppository 10 mg  10 mg Rectal Daily Thurnell Lose, MD   10 mg at 06/28/18 1237  . carvedilol (COREG) tablet 3.125 mg  3.125 mg Oral BID WC Thurnell Lose, MD   3.125 mg at 06/28/18 0953  . enoxaparin (LOVENOX) injection 40 mg  40 mg Subcutaneous Q24H Jani Gravel, MD   40 mg at 06/27/18 2137  . ferrous sulfate tablet 325 mg  325 mg Oral TID WC Thurnell Lose, MD   325 mg at 06/28/18 1240  .  Influenza vac split quadrivalent PF (FLUZONE HIGH-DOSE) injection 0.5 mL  0.5 mL Intramuscular Tomorrow-1000 Jani Gravel, MD      . naproxen (NAPROSYN) tablet 250 mg  250 mg Oral BID PRN Thurnell Lose, MD   250 mg at 06/27/18 0218  . pantoprazole (PROTONIX) EC tablet 40 mg  40 mg Oral Daily Thurnell Lose, MD   40 mg at 06/28/18 0953  . polyethylene glycol (MIRALAX / GLYCOLAX) packet 17 g  17 g Oral BID Thurnell Lose, MD      . Vitamin D (Ergocalciferol) (DRISDOL) capsule 50,000 Units  50,000 Units Oral Q7 days Jani Gravel, MD   50,000 Units at 06/27/18 2352     Discharge Medications: Please see discharge summary for a list of discharge medications.  Relevant Imaging  Results:  Relevant Lab Results:   Additional Information SS# [id number]  Wende Neighbors, LCSW

## 2018-06-29 LAB — COMPREHENSIVE METABOLIC PANEL
ALK PHOS: 86 U/L (ref 38–126)
ALT: 12 U/L (ref 0–44)
AST: 19 U/L (ref 15–41)
Albumin: 2.4 g/dL — ABNORMAL LOW (ref 3.5–5.0)
Anion gap: 6 (ref 5–15)
BILIRUBIN TOTAL: 0.5 mg/dL (ref 0.3–1.2)
BUN: 10 mg/dL (ref 8–23)
CHLORIDE: 108 mmol/L (ref 98–111)
CO2: 26 mmol/L (ref 22–32)
Calcium: 8.8 mg/dL — ABNORMAL LOW (ref 8.9–10.3)
Creatinine, Ser: 0.8 mg/dL (ref 0.44–1.00)
GFR calc Af Amer: >60 mL/min (ref >60)
GFR calc non Af Amer: >60 mL/min (ref >60)
Glucose, Bld: 88 mg/dL (ref 70–99)
Potassium: 3.6 mmol/L (ref 3.5–5.1)
SODIUM: 140 mmol/L (ref 135–145)
Total Protein: 6.6 g/dL (ref 6.5–8.1)

## 2018-06-29 MED ORDER — PANTOPRAZOLE SODIUM 40 MG PO TBEC: 40.0000 mg | DELAYED_RELEASE_TABLET | Freq: Every day | ORAL | Status: DC

## 2018-06-29 MED ORDER — ASPIRIN 81 MG PO CHEW: 81.0000 mg | CHEWABLE_TABLET | Freq: Every day | ORAL | 0 refills | Status: DC

## 2018-06-29 MED ORDER — FERROUS SULFATE 325 (65 FE) MG PO TABS: 325.0000 mg | ORAL_TABLET | Freq: Two times a day (BID) | ORAL | 3 refills | Status: DC

## 2018-06-29 MED ORDER — SODIUM CHLORIDE 0.9 % IV SOLN: 2.0000 g | INTRAVENOUS | Status: DC

## 2018-06-29 MED ORDER — CARVEDILOL 3.125 MG PO TABS: 3.1250 mg | ORAL_TABLET | Freq: Two times a day (BID) | ORAL | Status: DC

## 2018-06-29 NOTE — Discharge Instructions (Signed)
Follow with Primary MD [name]., MD in 7 days   Get CBC, CMP, 2 view Chest X ray checked  by Primary MD or SNF MD in 5-7 days  Activity: As tolerated with Full fall precautions use walker/cane & assistance as needed  Disposition SNF  Diet: Heart Healthy   For Heart failure patients - Check your Weight same time everyday, if you gain over 2 pounds, or you develop in leg swelling, experience more shortness of breath or chest pain, call your Primary MD immediately. Follow Cardiac Low Salt Diet and 1.5 lit/day fluid restriction.  Special Instructions: If you have smoked or chewed Tobacco  in the last 2 yrs please stop smoking, stop any regular Alcohol  and or any Recreational drug use.  On your next visit with your primary care physician please Get Medicines reviewed and adjusted.  Please request your Prim.MD to go over all Hospital Tests and Procedure/Radiological results at the follow up, please get all Hospital records sent to your Prim MD by signing hospital release before you go home.  If you experience worsening of your admission symptoms, develop shortness of breath, life threatening emergency, suicidal or homicidal thoughts you must seek medical attention immediately by calling 911 or calling your MD immediately  if symptoms less severe.  You Must read complete instructions/literature along with all the possible adverse reactions/side effects for all the Medicines you take and that have been prescribed to you. Take any new Medicines after you have completely understood and accpet all the possible adverse reactions/side effects.

## 2018-06-29 NOTE — Progress Notes (Signed)
CSW acknowledging discharge order. Patient insurance has denied authorization, requesting peer to peer review. MD agreeable, sent information in. Peer to peer will not occur until tomorrow morning, patient will not be able to DC to SNF today. CSW to touch base with patient's husband on possibility that he will have to take her home, that SNF may not be paid for by insurance.   CSW to follow.  Laveda Abbe, [place] Clinical Social Worker [phone]

## 2018-06-29 NOTE — Discharge Summary (Addendum)
Heather Lutz:[id number] DOB: [birth year] DOA: 06/25/2018  PCP: [name]., MD  Admit date: 06/25/2018  Discharge date: 06/30/2018   Admitted From: Home   Disposition:  SNF, initially declined by insurance but approved after peer to peer interaction.   Recommendations for Outpatient Follow-up:   Follow up with PCP in 1-2 weeks  PCP Please obtain BMP/CBC, 2 view CXR in 1week,  (see Discharge instructions)   PCP Please follow up on the following pending results:    Home Health: None Equipment/Devices: None Consultations: id Discharge Condition: Fair CODE STATUS: Full Diet Recommendation: Heart Healthy   Diet Order            Diet - low sodium heart healthy        Diet regular Room service appropriate? Yes; Fluid consistency: Thin  Diet effective now              CC - FEVER  Brief history of present illness from the day of admission and additional interim summary    [place] a72 y.o.female,w Gerd, Multiple Sclerosis apparently presents with c/onumbness in her legs as well as achiness and weakness since this past Friday. Pt notes fever today and increase in frequency of urination and therefore presented to ED for evaluation.                                                                   Hospital Course   1. Sepsis with Enterococcus bacteremia and E.Coli UTI.  Sepsis pathophysiology has resolved, she is currently on IV ampicillin with a stop date of 07/08/2018, she received a PICC line in the right arm and will be discharged to SNF,, repeat surveillance cultures are negative thus far, transthoracic echo stable.  Enterococcus source likely was left-sided ureteric obstruction although that was chronic but did have hydronephrosis and could have been infected, otherwise CT  abdomen pelvis unremarkable, complete 2 weeks of ampicillin total, outpatient urology follow-up.  Seen by ID.  2.  HX of MS.  Bedbound.  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  Good strength in upper extremities.  Supportive care and PT.  3.  HX of lung nodule.  Nonspecific CT chest and CT head findings.  Follow with PCP for age-appropriate follow-up.  4.  Iron deficiency anemia.  Worse due to him dilution, no signs of active bleeding, placed on PPI, IV iron and oral iron supplementation, age-appropriate outpatient PCP follow-up. Discharge on iron and PPI, stopped naproxen on discharge.  5.  Severe hypomagnesemia, hypocalcemia and hypokalemia.  All replaced.  6.  Nonspecific rise in troponin.  Trend flat and and non-ACS pattern, mild troponin bump likely due to bacteremia and sepsis, chest pain-free with nonacute EKG (partial to complete left bundle branch block consistent with old EKGs but  with mild progression).  Echocardiogram noted with a preserved EF of 65% and no wall motion abnormalities.  On low-dose beta-blocker and aspirin.  Outpatient cardiology follow-up post discharge.  7.    Left ureteral stone with hydronephrosis.  Case discussed with urologist Dr. Tammi Klippel, also CT scan reviewed from 2018 January with similar findings, this is likely chronic, although she did develop enterococcal bacteremia with no clear source hence it can be presumed that the proximal obstruction and hydronephrosis got infected, complete 2 weeks of IV ampicillin with outpatient urology follow-up..  8.  Recent eye surgery with nonspecific findings of possible foreign body.  Requested to follow with her ophthalmologist at [facility] within 1 to 2 weeks post discharge.   Discharge diagnosis     Principal Problem:   Sepsis ([place]) Active Problems:   Multiple sclerosis (H[facility])   Acute lower UTI   Acute cystitis with hematuria    Discharge instructions    Discharge Instructions    Diet -  low sodium heart healthy   Complete by:  As directed    Discharge instructions   Complete by:  As directed    Follow with Primary MD [name]., MD in 7 days   Get CBC, CMP, 2 view Chest X ray checked  by Primary MD or SNF MD in 5-7 days  Activity: As tolerated with Full fall precautions use walker/cane & assistance as needed  Disposition SNF  Diet: Heart Healthy   For Heart failure patients - Check your Weight same time everyday, if you gain over 2 pounds, or you develop in leg swelling, experience more shortness of breath or chest pain, call your Primary MD immediately. Follow Cardiac Low Salt Diet and 1.5 lit/day fluid restriction.  Special Instructions: If you have smoked or chewed Tobacco  in the last 2 yrs please stop smoking, stop any regular Alcohol  and or any Recreational drug use.  On your next visit with your primary care physician please Get Medicines reviewed and adjusted.  Please request your Prim.MD to go over all Hospital Tests and Procedure/Radiological results at the follow up, please get all Hospital records sent to your Prim MD by signing hospital release before you go home.  If you experience worsening of your admission symptoms, develop shortness of breath, life threatening emergency, suicidal or homicidal thoughts you must seek medical attention immediately by calling 911 or calling your MD immediately  if symptoms less severe.  You Must read complete instructions/literature along with all the possible adverse reactions/side effects for all the Medicines you take and that have been prescribed to you. Take any new Medicines after you have completely understood and accpet all the possible adverse reactions/side effects.   Increase activity slowly   Complete by:  As directed       Discharge Medications   Allergies as of 06/30/2018      Reactions   Other Itching   Walnuts, honeydew, watermelon, canteloupe, bananas   Epinephrine Anxiety, Other (See  Comments)   Hallucinations       Medication List    STOP taking these medications   naproxen sodium 220 MG tablet Commonly known as:  ALEVE     TAKE these medications   ampicillin 2 g in sodium chloride 0.9 % 100 mL Inject 2 g into the vein every 4 (four) hours. Stop date 07/08/2018.   aspirin 81 MG chewable tablet Chew 1 tablet (81 mg total) by mouth daily.   baclofen 10 MG tablet Commonly known as:  LIORESAL Take 1 tablet (10 mg total) by mouth 3 (three) times daily as needed for muscle spasms.   carvedilol 3.125 MG tablet Commonly known as:  COREG Take 1 tablet (3.125 mg total) by mouth 2 (two) times daily with a meal.   ferrous sulfate 325 (65 FE) MG tablet Take 1 tablet (325 mg total) by mouth 2 (two) times daily with a meal.   pantoprazole 40 MG tablet Commonly known as:  PROTONIX Take 1 tablet (40 mg total) by mouth daily.   polyethylene glycol packet Commonly known as:  MIRALAX / GLYCOLAX Take 17 g by mouth daily.        Contact information for follow-up providers    [name]., MD. Schedule an appointment as soon as possible for a visit in 1 week(s).   Specialty:  Internal Medicine Why:  discuss your CT results Contact information: [address] [place] [address] [phone]        Dimas Alexandria, MD. Schedule an appointment as soon as possible for a visit in 1 week(s).   Specialty:  Ophthalmology Why:  CT results Contact information: [place] [address] [phone]        Alexis Frock, MD. Schedule an appointment as soon as possible for a visit in 1 week(s).   Specialty:  Urology Why:  Left ureter stone with hydronephrosis and possible sepsis Contact information: [address] [place] [address] [phone]        Jerline Pain, MD. Schedule an appointment as soon as possible for a visit in 1 week(s).   Specialty:  Cardiology Why:  chf Contact  information: [address] [place] [address] [phone]            Contact information for after-discharge care    Destination    [facility] Preferred SNF .   Service:  Skilled Nursing Contact information: [address] [place] [phone]                  Major procedures and Radiology Reports - PLEASE review detailed and final reports thoroughly  -      CT head & CT angiogram chest.  Both nonacute.  Lower extremity venous duplex.  No DVT SVT.  TTE  - LVEF 65-70%, moderate LVH, normal wall motion, grade 1 DD, indeterminate LV filling pressure, trivial MR, normal LA size, trivial TR, RVSP 28 mmHg, normal IVC.    Ct Head Wo Contrast  Result Date: 06/25/2018 CLINICAL DATA:  Neurologic deficit, subacute. Worsening muscle spasms. EXAM: CT HEAD WITHOUT CONTRAST TECHNIQUE: Contiguous axial images were obtained from the base of the skull through the vertex without intravenous contrast. COMPARISON:  None. FINDINGS: Brain: No evidence of acute infarction, hemorrhage, hydrocephalus, extra-axial collection or mass lesion/mass effect. There is mild diffuse low-attenuation within the subcortical and periventricular white matter compatible with chronic microvascular disease. Vascular: No hyperdense vessel or unexpected calcification. Skull: Normal. Negative for fracture or focal lesion. Sinuses/Orbits: The paranasal sinuses and mastoid air cells are clear. Within the anterior left globe there is a 2 mm calcific or metallic foreign body of uncertain etiology. A normal appearing left lens is not visualized and this may be postsurgical. Other: None IMPRESSION: 1. No acute intracranial abnormalities. 2. Chronic small vessel ischemic change. 3. Small radiopaque foreign body within the anterior left globe is identified of uncertain clinical significance. This may be postsurgical  or posttraumatic in etiology. Clinical  correlation is advised. Electronically Signed   By: Kerby Moors M.D.   On: 06/25/2018 17:08   Ct Angio Chest Pe W Or Wo Contrast  Result Date: 06/26/2018 CLINICAL DATA:  72 year old female with history of weakness in her extremities. Positive D-dimer. EXAM: CT ANGIOGRAPHY CHEST WITH CONTRAST TECHNIQUE: Multidetector CT imaging of the chest was performed using the standard protocol during bolus administration of intravenous contrast. Multiplanar CT image reconstructions and MIPs were obtained to evaluate the vascular anatomy. CONTRAST:  157mL ISOVUE-370 IOPAMIDOL (ISOVUE-370) INJECTION 76% COMPARISON:  Chest CT 11/17/2014. FINDINGS: Cardiovascular: No filling defects in the pulmonary arterial tree to suggest underlying pulmonary embolism. Heart size is borderline enlarged. There is no significant pericardial fluid, thickening or pericardial calcification. There is aortic atherosclerosis, as well as atherosclerosis of the great vessels of the mediastinum and the coronary arteries, including calcified atherosclerotic plaque in the left anterior descending and right coronary arteries. Mediastinum/Nodes: No pathologically enlarged mediastinal or hilar lymph nodes. Large hiatal hernia. No axillary lymphadenopathy. Lungs/Pleura: 5 mm right upper lobe nodule (axial image 54 of series 7), unchanged compared to 2016, considered definitively benign. No other suspicious pulmonary nodules or masses are noted. Chronic scarring in the lower lobes of the lungs bilaterally, similar to the prior examination from 2016. No acute consolidative airspace disease. No pleural effusions. Upper Abdomen: Unremarkable. Musculoskeletal: There are no aggressive appearing lytic or blastic lesions noted in the visualized portions of the skeleton. Review of the MIP images confirms the above findings. IMPRESSION: 1. No evidence of pulmonary embolism. 2. No acute findings in the thorax to account for the patient's symptoms. 3. Large hiatal  hernia. 4. Aortic atherosclerosis, in addition to 2 vessel coronary artery disease. Assessment for potential risk factor modification, dietary therapy or pharmacologic therapy may be warranted, if clinically indicated. Aortic Atherosclerosis (ICD10-I70.0). Electronically Signed   By: Vinnie Langton M.D.   On: 06/26/2018 02:42   Ct Abdomen Pelvis W Contrast  Result Date: 06/28/2018 CLINICAL DATA:  Abdominal pain. Diverticulitis suspected. Bacteremia and sepsis. EXAM: CT ABDOMEN AND PELVIS WITH CONTRAST TECHNIQUE: Multidetector CT imaging of the abdomen and pelvis was performed using the standard protocol following bolus administration of intravenous contrast. CONTRAST:  192mL ISOVUE-370 IOPAMIDOL (ISOVUE-370) INJECTION 76% COMPARISON:  Plain films 06/28/2018.  CT 09/21/2017. FINDINGS: Lower chest: Bibasilar atelectasis. Normal heart size with trace bilateral pleural fluid. Moderate hiatal hernia. Hepatobiliary: Inferior right hepatic lobe hypoattenuating lesion is likely a cyst. Borderline gallbladder distention, without calcified stone or surrounding inflammation. No biliary duct dilatation. Pancreas: Mild degradation secondary to patient left arm position, not raised above the head. There is likely minimal concurrent motion degradation. Normal, without mass or ductal dilatation. Spleen: Normal in size, without focal abnormality. Adrenals/Urinary Tract: Normal adrenal glands. Interpolar right renal subcentimeter low-density lesion is likely a cyst. There is an interpolar 3.5 cm left renal cyst. Left larger than right collecting system calculi. The largest left-sided stone is in the lower pole region at 6 mm. Moderate left-sided hydronephrosis with delayed contrast excretion from the left kidney. This continues to the level of a proximal left ureteric 1.4 cm stone on coronal image 96. Normal urinary bladder. Stomach/Bowel: Normal remainder of the stomach. Colonic stool burden suggests constipation. Normal  terminal ileum and appendix. Normal small bowel. Vascular/Lymphatic: Aortic atherosclerosis. No abdominopelvic adenopathy. Reproductive: Hysterectomy.  No adnexal mass. Other: Trace cul-de-sac fluid, new or increased since the prior. No free intraperitoneal air. Musculoskeletal: Disc bulge at L4-5. IMPRESSION: 1.  Moderate left-sided urinary tract obstruction secondary to a 1.4 cm proximal left ureteric stone. 2. Bilateral nephrolithiasis. 3.  Possible constipation. 4. New or increased small volume cul-de-sac fluid. 5. Hiatal hernia. 6. Mild limitations as detailed above. Electronically Signed   By: Abigail Miyamoto M.D.   On: 06/28/2018 11:58   US Venous Img Lower Bilateral  Result Date: 06/25/2018 CLINICAL DATA:  Bilateral lower extremity swelling with right thigh pain EXAM: BILATERAL LOWER EXTREMITY VENOUS DOPPLER ULTRASOUND TECHNIQUE: Gray-scale sonography with graded compression, as well as color Doppler and duplex ultrasound were performed to evaluate the lower extremity deep venous systems from the level of the common femoral vein and including the common femoral, femoral, profunda femoral, popliteal and calf veins including the posterior tibial, peroneal and gastrocnemius veins when visible. The superficial great saphenous vein was also interrogated. Spectral Doppler was utilized to evaluate flow at rest and with distal augmentation maneuvers in the common femoral, femoral and popliteal veins. COMPARISON:  None. FINDINGS: RIGHT LOWER EXTREMITY Common Femoral Vein: No evidence of thrombus. Normal compressibility, respiratory phasicity and response to augmentation. Saphenofemoral Junction: No evidence of thrombus. Normal compressibility and flow on color Doppler imaging. Profunda Femoral Vein: No evidence of thrombus. Normal compressibility and flow on color Doppler imaging. Femoral Vein: No evidence of thrombus. Normal compressibility, respiratory phasicity and response to augmentation. Popliteal Vein: No  evidence of thrombus. Normal compressibility, respiratory phasicity and response to augmentation. Calf Veins: No evidence of thrombus. Normal compressibility and flow on color Doppler imaging. Superficial Great Saphenous Vein: No evidence of thrombus. Normal compressibility. Venous Reflux:  None. Other Findings:  None. LEFT LOWER EXTREMITY Common Femoral Vein: No evidence of thrombus. Normal compressibility, respiratory phasicity and response to augmentation. Saphenofemoral Junction: No evidence of thrombus. Normal compressibility and flow on color Doppler imaging. Profunda Femoral Vein: No evidence of thrombus. Normal compressibility and flow on color Doppler imaging. Femoral Vein: No evidence of thrombus. Normal compressibility, respiratory phasicity and response to augmentation. Popliteal Vein: No evidence of thrombus. Normal compressibility, respiratory phasicity and response to augmentation. Calf Veins: No evidence of thrombus. Normal compressibility and flow on color Doppler imaging. Superficial Great Saphenous Vein: No evidence of thrombus. Normal compressibility. Venous Reflux:  None. Other Findings:  None. IMPRESSION: No evidence of deep venous thrombosis in either lower extremity. Electronically Signed   By: Ilona Sorrel M.D.   On: 06/25/2018 18:07   Dg Chest Port 1 View  Result Date: 06/26/2018 CLINICAL DATA:  Shortness of breath. EXAM: PORTABLE CHEST 1 VIEW COMPARISON:  CT chest from same day.  Chest x-ray from yesterday. FINDINGS: The heart size and mediastinal contours are within normal limits. Normal pulmonary vascularity. Unchanged linear scarring at the left lung base. No focal consolidation, pleural effusion, or pneumothorax. No acute osseous abnormality. Unchanged moderate hiatal hernia. IMPRESSION: No active disease. Electronically Signed   By: Titus Dubin M.D.   On: 06/26/2018 16:54   Dg Chest Port 1 View  Result Date: 06/25/2018 CLINICAL DATA:  Generalized pain EXAM: PORTABLE  CHEST 1 VIEW COMPARISON:  09/21/2017 chest radiograph. FINDINGS: Stable cardiomediastinal silhouette with normal heart size and moderate hiatal hernia. No pneumothorax. No pleural effusion. Stable minimal left basilar scarring versus atelectasis. No pulmonary edema. No acute consolidative airspace disease. IMPRESSION: 1. Minimal scarring versus atelectasis at the left lung base. Otherwise no active cardiopulmonary disease. 2. Moderate hiatal hernia. Electronically Signed   By: Ilona Sorrel M.D.   On: 06/25/2018 17:03   Dg Abd Portable 1v  Result Date:  06/28/2018 CLINICAL DATA:  Abdominal pain. Hx of GERD. EXAM: PORTABLE ABDOMEN - 1 VIEW COMPARISON:  09/21/2017 FINDINGS: Stomach and small bowel are nondilated. There is gaseous distention of the colon. Rectum appears decompressed. Left pelvic phleboliths. Regional bones unremarkable. IMPRESSION: Nonobstructive bowel gas pattern mild gaseous distention of the colon. Electronically Signed   By: Lucrezia Europe M.D.   On: 06/28/2018 08:41   [place] Ekg Site Rite  Result Date: 06/28/2018 If Site Rite image not attached, placement could not be confirmed due to current cardiac rhythm.   Micro Results     Recent Results (from the past 240 hour(s))  Urine culture     Status: Abnormal   Collection Time: 06/25/18  3:00 PM  Result Value Ref Range Status   Specimen Description   Final    URINE, RANDOM Performed at [facility], [address] [place], [place] [address]    Special Requests   Final    NONE Performed at [facility], [address] [place], [place] [address]    Culture >=100,000 COLONIES/mL ESCHERICHIA COLI (A)  Final   Report Status 06/28/2018 FINAL  Final   Organism ID, Bacteria ESCHERICHIA COLI (A)  Final      Susceptibility   Escherichia coli - MIC*    AMPICILLIN 8 SENSITIVE Sensitive     CEFAZOLIN <=4 SENSITIVE Sensitive     CEFTRIAXONE <=1 SENSITIVE Sensitive     CIPROFLOXACIN <=0.25 SENSITIVE Sensitive      GENTAMICIN <=1 SENSITIVE Sensitive     IMIPENEM <=0.25 SENSITIVE Sensitive     NITROFURANTOIN <=16 SENSITIVE Sensitive     TRIMETH/SULFA <=20 SENSITIVE Sensitive     AMPICILLIN/SULBACTAM 4 SENSITIVE Sensitive     PIP/TAZO <=4 SENSITIVE Sensitive     Extended ESBL NEGATIVE Sensitive     * >=100,000 COLONIES/mL ESCHERICHIA COLI  Culture, blood (routine x 2)     Status: Abnormal   Collection Time: 06/25/18  6:20 PM  Result Value Ref Range Status   Specimen Description   Final    BLOOD RIGHT ARM Performed at [facility], [address] [place], [place] [address]    Special Requests   Final    BOTTLES DRAWN AEROBIC AND ANAEROBIC Blood Culture results may not be optimal due to an inadequate volume of blood received in culture bottles Performed at [facility], [address] [place], [place] [address]    Culture  Setup Time   Final    GRAM POSITIVE COCCI IN CHAINS IN BOTH AEROBIC AND ANAEROBIC BOTTLES CRITICAL VALUE NOTED.  VALUE IS CONSISTENT WITH PREVIOUSLY REPORTED AND CALLED VALUE.    Culture (A)  Final    ENTEROCOCCUS FAECALIS SUSCEPTIBILITIES PERFORMED ON PREVIOUS CULTURE WITHIN THE LAST 5 DAYS. Performed at [facility], [place] [address], [place], [place] [address]    Report Status 06/28/2018 FINAL  Final  Culture, blood (routine x 2)     Status: Abnormal   Collection Time: 06/25/18  6:28 PM  Result Value Ref Range Status   Specimen Description   Final    BLOOD LEFT ARM Performed at [facility], [address] [place], [place] [address]    Special Requests   Final    BOTTLES DRAWN AEROBIC AND ANAEROBIC Blood Culture adequate volume Performed at [facility], [address] [place], [place] [address]    Culture  Setup Time   Final  GRAM POSITIVE COCCI IN CHAINS IN BOTH AEROBIC AND ANAEROBIC BOTTLES CRITICAL RESULT CALLED TO, READ BACK BY AND VERIFIED WITH: Alesia Morin [id number] MLM Performed at [facility], [place] [address], [place], [place] [address]    Culture ENTEROCOCCUS FAECALIS (A)  Final   Report Status 06/28/2018 FINAL  Final   Organism ID, Bacteria ENTEROCOCCUS FAECALIS  Final      Susceptibility   Enterococcus faecalis - MIC*    AMPICILLIN <=2 SENSITIVE Sensitive     VANCOMYCIN 1 SENSITIVE Sensitive     GENTAMICIN SYNERGY SENSITIVE Sensitive     * ENTEROCOCCUS FAECALIS  Blood Culture ID Panel (Reflexed)     Status: Abnormal   Collection Time: 06/25/18  6:28 PM  Result Value Ref Range Status   Enterococcus species DETECTED (A) NOT DETECTED Final    Comment: CRITICAL RESULT CALLED TO, READ BACK BY AND VERIFIED WITH: PHARMD T EGAN [id number] MLM    Vancomycin resistance NOT DETECTED NOT DETECTED Final   Listeria monocytogenes NOT DETECTED NOT DETECTED Final   Staphylococcus species NOT DETECTED NOT DETECTED Final   Staphylococcus aureus (BCID) NOT DETECTED NOT DETECTED Final   Methicillin resistance NOT DETECTED NOT DETECTED Final   Streptococcus species NOT DETECTED NOT DETECTED Final   Streptococcus agalactiae NOT DETECTED NOT DETECTED Final   Streptococcus pneumoniae NOT DETECTED NOT DETECTED Final   Streptococcus pyogenes NOT DETECTED NOT DETECTED Final   Acinetobacter baumannii NOT DETECTED NOT DETECTED Final   Enterobacteriaceae species NOT DETECTED NOT DETECTED Final   Enterobacter cloacae complex NOT DETECTED NOT DETECTED Final   Escherichia coli NOT DETECTED NOT DETECTED Final   Klebsiella oxytoca NOT DETECTED NOT DETECTED Final   Klebsiella pneumoniae NOT DETECTED NOT DETECTED Final   Proteus species NOT DETECTED NOT DETECTED Final   Serratia marcescens NOT DETECTED NOT DETECTED Final   Carbapenem resistance NOT DETECTED NOT DETECTED Final   Haemophilus influenzae NOT DETECTED NOT DETECTED Final   Neisseria meningitidis NOT DETECTED NOT DETECTED Final   Pseudomonas aeruginosa NOT DETECTED NOT DETECTED Final   Candida albicans NOT DETECTED NOT DETECTED  Final   Candida glabrata NOT DETECTED NOT DETECTED Final   Candida krusei NOT DETECTED NOT DETECTED Final   Candida parapsilosis NOT DETECTED NOT DETECTED Final   Candida tropicalis NOT DETECTED NOT DETECTED Final    Comment: Performed at [facility], [address] [place], [place] [address]  Culture, blood (Routine X 2) w Reflex to ID Panel     Status: None (Preliminary result)   Collection Time: 06/27/18  4:18 AM  Result Value Ref Range Status   Specimen Description BLOOD RIGHT HAND  Final   Special Requests   Final    BOTTLES DRAWN AEROBIC AND ANAEROBIC Blood Culture results may not be optimal due to an excessive volume of blood received in culture bottles   Culture   Final    NO GROWTH 3 DAYS Performed at [facility], [place] [address], [place], [place] [address]    Report Status PENDING  Incomplete    Today   Subjective    Heather Lutz today has no headache,no chest abdominal pain,no new weakness tingling or numbness, feels much better wants to go home today.    Objective   Blood pressure (!) 149/77, pulse 79, temperature 98.2 F (36.8 C), temperature source Oral, resp. rate 20, height 5\' 3"  (1.6 m), weight 81 kg, SpO2 92 %.   Intake/Output Summary (Last 24  hours) at 06/30/2018 0821 Last data filed at 06/29/2018 1600 Gross per 24 hour  Intake 210.85 ml  Output 1000 ml  Net -789.15 ml    Exam  Awake Alert, Oriented X 3, chronic bilateral lower extremity weakness with strength 1/5, chronic legal blindness, 5/5 strength in upper extremities, normal affect [place].AT,PERRAL Supple Neck,No JVD, No cervical lymphadenopathy appriciated.  Symmetrical Chest wall movement, Good air movement bilaterally, CTAB RRR,No Gallops, Rubs or new Murmurs, No Parasternal Heave +ve B.Sounds, Abd Soft, No tenderness, No organomegaly appriciated, No rebound - guarding or rigidity. No Cyanosis, Clubbing or edema, No new Rash or bruise    Data Review   CBC w Diff:  Lab  Results  Component Value Date   WBC 10.9 (H) 06/27/2018   HGB 8.1 (L) 06/27/2018   HCT 27.5 (L) 06/27/2018   PLT 180 06/27/2018   LYMPHOPCT 6 06/25/2018   MONOPCT 8 06/25/2018   EOSPCT 0 06/25/2018   BASOPCT 0 06/25/2018    CMP:  Lab Results  Component Value Date   NA 140 06/30/2018   K 3.6 06/30/2018   CL 106 06/30/2018   CO2 24 06/30/2018   BUN 9 06/30/2018   CREATININE 0.92 06/30/2018   PROT 6.5 06/30/2018   ALBUMIN 2.4 (L) 06/30/2018   BILITOT 0.6 06/30/2018   ALKPHOS 87 06/30/2018   AST 24 06/30/2018   ALT 15 06/30/2018  .   Total Time in preparing paper work, data evaluation and todays exam - 73 minutes  Lala Lund M.D on 06/30/2018 at [facility]  [phone]

## 2018-06-29 NOTE — Progress Notes (Signed)
Physical Therapy Treatment Patient Details Name: Heather Lutz MRN: [id number] DOB: 1946-09-05 Today's Date: 06/29/2018    History of Present Illness Pt is a 72 y.o. F with significant PMH of GERD, multiple sclerosis, who presents with increase in frequency of urination and fever.     PT Comments    Pt making slow progress. Continue to recommend pt d/c to SNF. Pt would continue to benefit from skilled physical therapy services at this time while admitted and after d/c to address the below listed limitations in order to improve overall safety and independence with functional mobility.    Follow Up Recommendations  SNF     Equipment Recommendations  None recommended by PT    Recommendations for Other Services       Precautions / Restrictions Precautions Precautions: Fall Precaution Comments: visual impairment Restrictions Weight Bearing Restrictions: No    Mobility  Bed Mobility Overal bed mobility: Needs Assistance Bed Mobility: Supine to Sit;Sit to Supine     Supine to sit: Max assist;+2 for physical assistance Sit to supine: Max assist;+2 for physical assistance   General bed mobility comments: increased time and effort, able to use UEs on bed rails to assist, assist required for bilateral LE movement off of and onto bed and for trunk elevation  Transfers                 General transfer comment: deferred  Ambulation/Gait                 Stairs             Wheelchair Mobility    Modified Rankin (Stroke Patients Only)       Balance Overall balance assessment: Needs assistance Sitting-balance support: Feet supported Sitting balance-Leahy Scale: Poor Sitting balance - Comments: pt progressing from needing max A to close min guard with UE support; pt able to sit EOB ~15 minutes Postural control: Posterior lean;Right lateral lean                                  Cognition Arousal/Alertness: Awake/alert Behavior  During Therapy: WFL for tasks assessed/performed Overall Cognitive Status: Within Functional Limits for tasks assessed                                        Exercises Other Exercises Other Exercises: lateral weight shifting and A-P weight shifting in sitting    General Comments        Pertinent Vitals/Pain Pain Assessment: Faces Faces Pain Scale: No hurt    Home Living                      Prior Function            PT Goals (current goals can now be found in the care plan section) Acute Rehab PT Goals PT Goal Formulation: With patient Time For Goal Achievement: 07/10/18 Potential to Achieve Goals: Fair Progress towards PT goals: Progressing toward goals    Frequency    Min 2X/week      PT Plan Current plan remains appropriate    Co-evaluation              AM-PAC PT "6 Clicks" Daily Activity  Outcome Measure  Difficulty turning over in bed (including adjusting bedclothes, sheets and blankets)?: Unable Difficulty  moving from lying on back to sitting on the side of the bed? : Unable Difficulty sitting down on and standing up from a chair with arms (e.g., wheelchair, bedside commode, etc,.)?: Unable Help needed moving to and from a bed to chair (including a wheelchair)?: Total Help needed walking in hospital room?: Total Help needed climbing 3-5 steps with a railing? : Total 6 Click Score: 6    End of Session   Activity Tolerance: Patient tolerated treatment well Patient left: in bed;with call bell/phone within reach;with bed alarm set Nurse Communication: Mobility status PT Visit Diagnosis: Muscle weakness (generalized) (M62.81);Other abnormalities of gait and mobility (R26.89)     Time: 5102-5852 PT Time Calculation (min) (ACUTE ONLY): 38 min  Charges:  $Therapeutic Activity: 38-52 mins                     Sherie Don, [place], DPT  Acute Rehabilitation Services Pager [phone] Office [place] 06/29/2018, 4:08 PM

## 2018-06-29 NOTE — Care Management Note (Signed)
Case Management Note  Patient Details  Name: Heather Lutz MRN: [id number] Date of Birth: 08-12-1946  Subjective/Objective:                  Sepsis with Enterococcus bacteremia andE.ColiUTI. Hx of [name], Multiple Sclerosis. From home with husband, Ray.   [place] [phone]     Action/Plan: Transition to SNF/ [facility]. CSW managing disposition to SNF.  Expected Discharge Date:  06/29/18               Expected Discharge Plan:  Skilled Nursing Facility  In-House Referral:  Clinical Social Work  Discharge planning Services  CM Consult  Post Acute Care Choice:  NA Choice offered to:  NA  DME Arranged:    DME Agency:  NA  HH Arranged:  NA HH Agency:  NA  Status of Service:  Completed, signed off  If discussed at [employer], dates discussed:    Additional Comments:  Sharin Mons, RN 06/29/2018, 12:29 PM

## 2018-06-30 DIAGNOSIS — R1010 Upper abdominal pain, unspecified: Secondary | ICD-10-CM

## 2018-06-30 DIAGNOSIS — R109 Unspecified abdominal pain: Secondary | ICD-10-CM

## 2018-06-30 LAB — COMPREHENSIVE METABOLIC PANEL
ALBUMIN: 2.4 g/dL — AB (ref 3.5–5.0)
ALT: 15 U/L (ref 0–44)
ANION GAP: 10 (ref 5–15)
AST: 24 U/L (ref 15–41)
Alkaline Phosphatase: 87 U/L (ref 38–126)
BUN: 9 mg/dL (ref 8–23)
CO2: 24 mmol/L (ref 22–32)
Calcium: 8.9 mg/dL (ref 8.9–10.3)
Chloride: 106 mmol/L (ref 98–111)
Creatinine, Ser: 0.92 mg/dL (ref 0.44–1.00)
GFR calc non Af Amer: >60 mL/min (ref >60)
GLUCOSE: 88 mg/dL (ref 70–99)
Potassium: 3.6 mmol/L (ref 3.5–5.1)
SODIUM: 140 mmol/L (ref 135–145)
Total Bilirubin: 0.6 mg/dL (ref 0.3–1.2)
Total Protein: 6.5 g/dL (ref 6.5–8.1)

## 2018-06-30 LAB — GLUCOSE, CAPILLARY: Glucose-Capillary: 80 mg/dL (ref 70–99)

## 2018-06-30 MED ORDER — BACLOFEN 10 MG PO TABS
10.0000 mg | ORAL_TABLET | Freq: Once | ORAL | Status: AC
  Administered 2018-06-30: 10 mg via ORAL
  Filled 2018-06-30: qty 1

## 2018-06-30 MED ORDER — HEPARIN SOD (PORK) LOCK FLUSH 100 UNIT/ML IV SOLN
500.0000 [IU] | Freq: Once | INTRAVENOUS | Status: AC
  Administered 2018-06-30: 250 [IU] via INTRAVENOUS

## 2018-06-30 NOTE — Care Management Important Message (Signed)
Important Message  Patient Details  Name: Heather Lutz MRN: [id number] Date of Birth: 01/02/1946   Medicare Important Message Given:  Yes    Orbie Pyo 06/30/2018, 3:49 PM

## 2018-06-30 NOTE — Progress Notes (Addendum)
Pt will DC to: [facility] date:06/30/18 Family notified: Ray, spouse Transport by: Public affairs consultant, patient, and facility notified of DC. Discharge Summary sent to facility. RN given number for [phone], room 511. DC packet on chart. Ambulance transport requested for patient. CSW informed RN to contact patient's son once [facility] Harold has arrived.  CSW signing off. Thurmond Butts, [place] Social Worker [phone]

## 2018-06-30 NOTE — Progress Notes (Signed)
Pt prepared for d/c to SNF. Picc line in place; heparin locked & capped via IV team. Skin intact except as charted in most recent assessments. Vitals are stable. Report called to receiving facility. Pt to be transported by ambulance service.

## 2018-07-01 ENCOUNTER — Encounter: Payer: Self-pay | Admitting: Internal Medicine

## 2018-07-01 ENCOUNTER — Non-Acute Institutional Stay (SKILLED_NURSING_FACILITY): Payer: Medicare Other | Admitting: Internal Medicine

## 2018-07-01 DIAGNOSIS — D508 Other iron deficiency anemias: Secondary | ICD-10-CM

## 2018-07-01 DIAGNOSIS — G35 Multiple sclerosis: Secondary | ICD-10-CM

## 2018-07-01 DIAGNOSIS — B9620 Unspecified Escherichia coli [E. coli] as the cause of diseases classified elsewhere: Secondary | ICD-10-CM

## 2018-07-01 DIAGNOSIS — N390 Urinary tract infection, site not specified: Secondary | ICD-10-CM

## 2018-07-01 DIAGNOSIS — E876 Hypokalemia: Secondary | ICD-10-CM

## 2018-07-01 DIAGNOSIS — B952 Enterococcus as the cause of diseases classified elsewhere: Secondary | ICD-10-CM

## 2018-07-01 DIAGNOSIS — R7881 Bacteremia: Secondary | ICD-10-CM | POA: Diagnosis not present

## 2018-07-01 DIAGNOSIS — Z9889 Other specified postprocedural states: Secondary | ICD-10-CM

## 2018-07-01 DIAGNOSIS — I25118 Atherosclerotic heart disease of native coronary artery with other forms of angina pectoris: Secondary | ICD-10-CM

## 2018-07-01 NOTE — Progress Notes (Signed)
:   Location:  [facility] Room Number: [phone] Place of Service:  SNF (31)  Heather Lutz. Heather Coil, MD  Patient Care Team: Heather Glee., MD as PCP - General (Internal Medicine) Trula Slade, DPM as Consulting Physician (Podiatry)  Extended Emergency Contact Information Primary Emergency Contact: Heather Lutz Address: [address]          [place] [address] Heather Lutz of [place] Phone: [phone] Work Phone: [phone] Mobile Phone: [phone] Relation: None Secondary Emergency Contact: Heather Lutz Work Phone: [phone] Mobile Phone: [phone] Relation: Son     Allergies: Other and Epinephrine  Chief Complaint  Patient presents with  . New Admit To SNF    Admit to [facility]    HPI: Patient is 72 y.o. female with GERD, MS, presented to the emergency department, complaint of numbness in her legs as well as achiness and weakness since the prior Friday.  Patient noted fever on day of admission and increase in frequency of urination.  In the ED patient was noted to have a fever, white count of 13.7 and tachycardia.  Patient was admitted to [facility] from 10/12-17 for work-up of sepsis secondary to UTI.  Patient was treated for enterococcus bacteremia and an E. coli UTI.  Patient was treated with IV ampicillin and will continue with a stop date of 07/08/2018.  It was felt that the enterococcal source was likely left-sided ureteral ureteral obstruction thought to be chronic with chronic hydronephrosis.  Patient also had severe hypomagnesemia, hypocalcemia and hypokalemia which were all replaced.  Patient had a nonspecific rise in troponin with a flat trend and a non-ACS pattern, likely due to bacteremia and sepsis.  Patient's EKG was nonacute with complete left bundle branch block consistent with old EKGs but with mild progression, echo with preserved EF of 65%.  Patient is bedbound at  baseline secondary to [place].  Patient is admitted to skilled nursing facility for OT/PT.  While at skilled nursing facility patient will be followed for coronary artery disease treated with aspirin and Coreg, iron deficiency anemia treated with replacement, and GERD treated with Protonix.  Past Medical History:  Diagnosis Date  . Blindness   . GERD (gastroesophageal reflux disease)   . Multiple sclerosis ([place])   . Osteoporosis     Past Surgical History:  Procedure Laterality Date  . ABDOMINAL HYSTERECTOMY    . ESOPHAGOGASTRODUODENOSCOPY N/A 11/06/2012   Procedure: ESOPHAGOGASTRODUODENOSCOPY (EGD);  Surgeon: Lear Ng, MD;  Location: [facility] ENDOSCOPY;  Service: Endoscopy;  Laterality: N/A;    Allergies as of 07/01/2018      Reactions   Other Itching   Walnuts, honeydew, watermelon, canteloupe, bananas   Epinephrine Anxiety, Other (See Comments)   Hallucinations       Medication List        Accurate as of 07/01/18 11:16 AM. Always use your most recent med list.          ampicillin 2 g in sodium chloride 0.9 % 100 mL Inject 2 g into the vein every 4 (four) hours. Stop date 07/08/2018.   aspirin 81 MG chewable tablet Chew 1 tablet (81 mg total) by mouth daily.   baclofen 10 MG tablet Commonly known as:  LIORESAL Take 1 tablet (10 mg total) by mouth 3 (three) times daily as needed for muscle spasms.   carvedilol 3.125 MG tablet Commonly known as:  COREG Take 1 tablet (3.125 mg total) by  mouth 2 (two) times daily with a meal.   ferrous sulfate 325 (65 FE) MG tablet Take 1 tablet (325 mg total) by mouth 2 (two) times daily with a meal.   pantoprazole 40 MG tablet Commonly known as:  PROTONIX Take 1 tablet (40 mg total) by mouth daily.   polyethylene glycol packet Commonly known as:  MIRALAX / GLYCOLAX Take 17 g by mouth daily.       No orders of the defined types were placed in this encounter.   Immunization History  Administered Date(s) Administered  .  Influenza, High Dose Seasonal PF 06/29/2018    Social History   Tobacco Use  . Smoking status: Never Smoker  . Smokeless tobacco: Never Used  Substance Use Topics  . Alcohol use: No    Alcohol/week: 0.0 standard drinks    Family history is   Family History  Problem Relation Age of Onset  . CAD Father       Review of Systems  DATA OBTAINED: from patient, nurse GENERAL:  no fevers, fatigue, appetite changes SKIN: No itching, or rash EYES: No eye pain, redness, discharge EARS: No earache, tinnitus, change in hearing NOSE: No congestion, drainage or bleeding  MOUTH/THROAT: No mouth or tooth pain, No sore throat RESPIRATORY: No cough, wheezing, SOB CARDIAC: No chest pain, palpitations, lower extremity edema  GI: No abdominal pain, No N/V/D or constipation, No heartburn or reflux  GU: No dysuria, frequency or urgency, or incontinence  MUSCULOSKELETAL: No unrelieved bone/joint pain NEUROLOGIC: No headache, dizziness or focal weakness PSYCHIATRIC: No c/o anxiety or sadness   Vitals:   07/01/18 1106  BP: (!) 151/92  Pulse: 98  Resp: 18  Temp: 98.7 F (37.1 C)    SpO2 Readings from Last 1 Encounters:  12/09/13 97%   Body mass index is 31.53 kg/m.     Physical Exam  GENERAL APPEARANCE: Alert, conversant,  No acute distress.  SKIN: No diaphoresis rash HEAD: Normocephalic, atraumatic  EYES: Conjunctiva/lids clear.  Patient is blind EOMs intact.  EARS: External exam WNL, canals clear. Hearing grossly normal.  NOSE: No deformity or discharge.  MOUTH/THROAT: Lips w/o lesions  RESPIRATORY: Breathing is even, unlabored. Lung sounds are clear   CARDIOVASCULAR: Heart RRR no murmurs, rubs or gallops. No peripheral edema.   GASTROINTESTINAL: Abdomen is soft, non-tender, not distended w/ normal bowel sounds. GENITOURINARY: Bladder non tender, not distended  MUSCULOSKELETAL: No abnormal joints or musculature NEUROLOGIC:  Cranial nerves 2-12 grossly intact. Moves all  extremities  PSYCHIATRIC: Mood and affect appropriate to situation, no behavioral issues  Patient Active Problem List   Diagnosis Date Noted  . Abdominal pain   . Acute lower UTI 06/25/2018  . Sepsis ([place]) 06/25/2018  . Acute cystitis with hematuria   . Venous stasis 11/20/2014  . Lung nodule < 6cm on CT 11/20/2014  . Left lower lobe pneumonia ([place]) 11/17/2014  . Influenza A (H1N1) 11/06/2012  . Hematemesis 11/06/2012  . CAP (community acquired pneumonia) 11/05/2012  . Hypoxemia 11/05/2012  . SOB (shortness of breath) 11/05/2012  . Multiple sclerosis ([place]) 11/05/2012      Labs reviewed: Basic Metabolic Panel:    Component Value Date/Time   NA 140 06/30/2018 0354   K 3.6 06/30/2018 0354   CL 106 06/30/2018 0354   CO2 24 06/30/2018 0354   GLUCOSE 88 06/30/2018 0354   BUN 9 06/30/2018 0354   CREATININE 0.92 06/30/2018 0354   CALCIUM 8.9 06/30/2018 0354   PROT 6.5 06/30/2018 0354  ALBUMIN 2.4 (L) 06/30/2018 0354   AST 24 06/30/2018 0354   ALT 15 06/30/2018 0354   ALKPHOS 87 06/30/2018 0354   BILITOT 0.6 06/30/2018 0354   GFRNONAA >60 06/30/2018 0354   GFRAA >60 06/30/2018 0354    Recent Labs    06/26/18 0352 06/27/18 0418 06/28/18 0602 06/29/18 0415 06/30/18 0354  NA 138 139 141 140 140  K 2.9* 4.3 3.7 3.6 3.6  CL 116* 111 109 108 106  CO2 18* 21* 24 26 24   GLUCOSE 85 90 86 88 88  BUN 13 13 13 10 9   CREATININE 0.90 1.14* 0.88 0.80 0.92  CALCIUM 6.4* 8.6* 8.8* 8.8* 8.9  MG 1.2* 2.5*  --   --   --    Liver Function Tests: Recent Labs    06/28/18 0602 06/29/18 0415 06/30/18 0354  AST 17 19 24   ALT 11 12 15   ALKPHOS 80 86 87  BILITOT 0.5 0.5 0.6  PROT 6.3* 6.6 6.5  ALBUMIN 2.4* 2.4* 2.4*   Recent Labs    09/21/17 1355 06/25/18 1554  LIPASE 28 23   No results for input(s): AMMONIA in the last 8760 hours. CBC: Recent Labs    09/21/17 1355 06/25/18 1554 06/26/18 0352 06/27/18 0418  WBC 5.2 13.7* 15.0* 10.9*  NEUTROABS 3.1 11.7*  --   --     HGB 10.6* 9.7* 7.5* 8.1*  HCT 33.2* 31.8* 25.1* 27.5*  MCV 72.3* 70.5* 70.3* 69.6*  PLT 260 204 179 180   Lipid Recent Labs    06/27/18 0418  CHOL 115  HDL 45  LDLCALC 52  TRIG 89    Cardiac Enzymes: Recent Labs    06/25/18 2207 06/26/18 0352 06/26/18 1119  TROPONINI 0.06* 0.10* 0.19*   BNP: Recent Labs    06/26/18 0352  BNP 181.7*   No results found for: MICROALBUR No results found for: HGBA1C Lab Results  Component Value Date   TSH 1.286 06/25/2018   Lab Results  Component Value Date   IHKVQQVZ56 387 06/26/2018   Lab Results  Component Value Date   FOLATE 7.4 06/26/2018   Lab Results  Component Value Date   IRON 9 (L) 06/26/2018   TIBC 343 06/26/2018   FERRITIN 11 06/26/2018    Imaging and Procedures obtained prior to SNF admission: Ct Head Wo Contrast  Result Date: 06/25/2018 CLINICAL DATA:  Neurologic deficit, subacute. Worsening muscle spasms. EXAM: CT HEAD WITHOUT CONTRAST TECHNIQUE: Contiguous axial images were obtained from the base of the skull through the vertex without intravenous contrast. COMPARISON:  None. FINDINGS: Brain: No evidence of acute infarction, hemorrhage, hydrocephalus, extra-axial collection or mass lesion/mass effect. There is mild diffuse low-attenuation within the subcortical and periventricular white matter compatible with chronic microvascular disease. Vascular: No hyperdense vessel or unexpected calcification. Skull: Normal. Negative for fracture or focal lesion. Sinuses/Orbits: The paranasal sinuses and mastoid air cells are clear. Within the anterior left globe there is a 2 mm calcific or metallic foreign body of uncertain etiology. A normal appearing left lens is not visualized and this may be postsurgical. Other: None IMPRESSION: 1. No acute intracranial abnormalities. 2. Chronic small vessel ischemic change. 3. Small radiopaque foreign body within the anterior left globe is identified of uncertain clinical significance.  This may be postsurgical or posttraumatic in etiology. Clinical correlation is advised. Electronically Signed   By: Kerby Moors M.D.   On: 06/25/2018 17:08   Ct Angio Chest Pe W Or Wo Contrast  Result Date: 06/26/2018 CLINICAL  DATA:  72 year old female with history of weakness in her extremities. Positive D-dimer. EXAM: CT ANGIOGRAPHY CHEST WITH CONTRAST TECHNIQUE: Multidetector CT imaging of the chest was performed using the standard protocol during bolus administration of intravenous contrast. Multiplanar CT image reconstructions and MIPs were obtained to evaluate the vascular anatomy. CONTRAST:  160mL ISOVUE-370 IOPAMIDOL (ISOVUE-370) INJECTION 76% COMPARISON:  Chest CT 11/17/2014. FINDINGS: Cardiovascular: No filling defects in the pulmonary arterial tree to suggest underlying pulmonary embolism. Heart size is borderline enlarged. There is no significant pericardial fluid, thickening or pericardial calcification. There is aortic atherosclerosis, as well as atherosclerosis of the great vessels of the mediastinum and the coronary arteries, including calcified atherosclerotic plaque in the left anterior descending and right coronary arteries. Mediastinum/Nodes: No pathologically enlarged mediastinal or hilar lymph nodes. Large hiatal hernia. No axillary lymphadenopathy. Lungs/Pleura: 5 mm right upper lobe nodule (axial image 54 of series 7), unchanged compared to 2016, considered definitively benign. No other suspicious pulmonary nodules or masses are noted. Chronic scarring in the lower lobes of the lungs bilaterally, similar to the prior examination from 2016. No acute consolidative airspace disease. No pleural effusions. Upper Abdomen: Unremarkable. Musculoskeletal: There are no aggressive appearing lytic or blastic lesions noted in the visualized portions of the skeleton. Review of the MIP images confirms the above findings. IMPRESSION: 1. No evidence of pulmonary embolism. 2. No acute findings in the  thorax to account for the patient's symptoms. 3. Large hiatal hernia. 4. Aortic atherosclerosis, in addition to 2 vessel coronary artery disease. Assessment for potential risk factor modification, dietary therapy or pharmacologic therapy may be warranted, if clinically indicated. Aortic Atherosclerosis (ICD10-I70.0). Electronically Signed   By: Vinnie Langton M.D.   On: 06/26/2018 02:42   US Venous Img Lower Bilateral  Result Date: 06/25/2018 CLINICAL DATA:  Bilateral lower extremity swelling with right thigh pain EXAM: BILATERAL LOWER EXTREMITY VENOUS DOPPLER ULTRASOUND TECHNIQUE: Gray-scale sonography with graded compression, as well as color Doppler and duplex ultrasound were performed to evaluate the lower extremity deep venous systems from the level of the common femoral vein and including the common femoral, femoral, profunda femoral, popliteal and calf veins including the posterior tibial, peroneal and gastrocnemius veins when visible. The superficial great saphenous vein was also interrogated. Spectral Doppler was utilized to evaluate flow at rest and with distal augmentation maneuvers in the common femoral, femoral and popliteal veins. COMPARISON:  None. FINDINGS: RIGHT LOWER EXTREMITY Common Femoral Vein: No evidence of thrombus. Normal compressibility, respiratory phasicity and response to augmentation. Saphenofemoral Junction: No evidence of thrombus. Normal compressibility and flow on color Doppler imaging. Profunda Femoral Vein: No evidence of thrombus. Normal compressibility and flow on color Doppler imaging. Femoral Vein: No evidence of thrombus. Normal compressibility, respiratory phasicity and response to augmentation. Popliteal Vein: No evidence of thrombus. Normal compressibility, respiratory phasicity and response to augmentation. Calf Veins: No evidence of thrombus. Normal compressibility and flow on color Doppler imaging. Superficial Great Saphenous Vein: No evidence of thrombus. Normal  compressibility. Venous Reflux:  None. Other Findings:  None. LEFT LOWER EXTREMITY Common Femoral Vein: No evidence of thrombus. Normal compressibility, respiratory phasicity and response to augmentation. Saphenofemoral Junction: No evidence of thrombus. Normal compressibility and flow on color Doppler imaging. Profunda Femoral Vein: No evidence of thrombus. Normal compressibility and flow on color Doppler imaging. Femoral Vein: No evidence of thrombus. Normal compressibility, respiratory phasicity and response to augmentation. Popliteal Vein: No evidence of thrombus. Normal compressibility, respiratory phasicity and response to augmentation. Calf Veins: No evidence of  thrombus. Normal compressibility and flow on color Doppler imaging. Superficial Great Saphenous Vein: No evidence of thrombus. Normal compressibility. Venous Reflux:  None. Other Findings:  None. IMPRESSION: No evidence of deep venous thrombosis in either lower extremity. Electronically Signed   By: Ilona Sorrel M.D.   On: 06/25/2018 18:07   Dg Chest Port 1 View  Result Date: 06/26/2018 CLINICAL DATA:  Shortness of breath. EXAM: PORTABLE CHEST 1 VIEW COMPARISON:  CT chest from same day.  Chest x-ray from yesterday. FINDINGS: The heart size and mediastinal contours are within normal limits. Normal pulmonary vascularity. Unchanged linear scarring at the left lung base. No focal consolidation, pleural effusion, or pneumothorax. No acute osseous abnormality. Unchanged moderate hiatal hernia. IMPRESSION: No active disease. Electronically Signed   By: Titus Dubin M.D.   On: 06/26/2018 16:54   Dg Chest Port 1 View  Result Date: 06/25/2018 CLINICAL DATA:  Generalized pain EXAM: PORTABLE CHEST 1 VIEW COMPARISON:  09/21/2017 chest radiograph. FINDINGS: Stable cardiomediastinal silhouette with normal heart size and moderate hiatal hernia. No pneumothorax. No pleural effusion. Stable minimal left basilar scarring versus atelectasis. No pulmonary  edema. No acute consolidative airspace disease. IMPRESSION: 1. Minimal scarring versus atelectasis at the left lung base. Otherwise no active cardiopulmonary disease. 2. Moderate hiatal hernia. Electronically Signed   By: Ilona Sorrel M.D.   On: 06/25/2018 17:03     Not all labs, radiology exams or other studies done during hospitalization come through on my EPIC note; however they are reviewed by me.    Assessment and Plan  Enterococcus sepsis/E. coli UTI/left ureteral stone with hydronephrosis, chronic- patient treated with IV ampicillin with a stop date of 07/08/2018 via PICC line in her right arm.  Transthoracic echo stable, enterococcus source likely left sided ureteral obstruction, which though chronic did have hydronephrosis and could be infected, input with urology and to be seen by urology as outpatient SNF -admitted for OT/PT; continue IV ampicillin 2 g every 4 hours.  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, patient on no MS specific medications SNF -continue supportive care; continue baclofen 10 mg 3 times daily  Iron deficiency anemia-worse with hemodilution, patient was given IV iron, Naprosyn was stopped SNF -continue ferrous sulfate 325 twice daily; will follow-up CBC  Hypomagnesemia/hypokalemia/hypocalcemia-repleted SNF -follow-up magnesium and calcium and potassium levels  Coronary artery disease SNF -no complaints of chest pain or equivalent continue beta-blocker Coreg 3.125 mg twice daily and ASA 81 mg daily; patient to follow-up with cardiology as outpatient  Recent eye surgery-with nonspecific findings of possible foreign body; requested to follow with her ophthalmologist to ask in [place] in 1 to 2 weeks after discharge SNF -follow-up with [facility] as above    Time spent greater than 45 minutes;> 50% of time with patient was spent reviewing records, labs, tests and  studies, counseling and developing plan of care  Webb Silversmith D. Heather Coil, MD

## 2018-07-02 ENCOUNTER — Encounter: Payer: Self-pay | Admitting: Internal Medicine

## 2018-07-02 DIAGNOSIS — R7881 Bacteremia: Principal | ICD-10-CM | POA: Insufficient documentation

## 2018-07-02 DIAGNOSIS — I2510 Atherosclerotic heart disease of native coronary artery without angina pectoris: Secondary | ICD-10-CM | POA: Insufficient documentation

## 2018-07-02 DIAGNOSIS — B9620 Unspecified Escherichia coli [E. coli] as the cause of diseases classified elsewhere: Secondary | ICD-10-CM | POA: Insufficient documentation

## 2018-07-02 DIAGNOSIS — N390 Urinary tract infection, site not specified: Secondary | ICD-10-CM

## 2018-07-02 DIAGNOSIS — Z9889 Other specified postprocedural states: Secondary | ICD-10-CM | POA: Insufficient documentation

## 2018-07-02 DIAGNOSIS — B952 Enterococcus as the cause of diseases classified elsewhere: Secondary | ICD-10-CM | POA: Insufficient documentation

## 2018-07-02 DIAGNOSIS — D509 Iron deficiency anemia, unspecified: Secondary | ICD-10-CM | POA: Insufficient documentation

## 2018-07-02 DIAGNOSIS — E876 Hypokalemia: Secondary | ICD-10-CM | POA: Insufficient documentation

## 2018-07-02 LAB — CULTURE, BLOOD (ROUTINE X 2): Culture: NO GROWTH

## 2018-07-06 LAB — BASIC METABOLIC PANEL
BUN: 12 (ref 4–21)
Creatinine: 0.8 (ref 0.5–1.1)
GLUCOSE: 80
POTASSIUM: 4.4 (ref 3.4–5.3)
SODIUM: 140 (ref 137–147)

## 2018-07-06 LAB — CBC AND DIFFERENTIAL
HCT: 30 — AB (ref 36–46)
HEMOGLOBIN: 9.4 — AB (ref 12.0–16.0)
Platelets: 285 (ref 150–399)
WBC: 7.2

## 2018-07-11 ENCOUNTER — Ambulatory Visit: Payer: Medicare Other | Admitting: Cardiology

## 2018-07-11 ENCOUNTER — Other Ambulatory Visit: Payer: Self-pay | Admitting: Urology

## 2018-07-13 DIAGNOSIS — R778 Other specified abnormalities of plasma proteins: Secondary | ICD-10-CM | POA: Insufficient documentation

## 2018-07-13 DIAGNOSIS — I447 Left bundle-branch block, unspecified: Secondary | ICD-10-CM | POA: Insufficient documentation

## 2018-07-13 DIAGNOSIS — I517 Cardiomegaly: Secondary | ICD-10-CM | POA: Insufficient documentation

## 2018-07-13 DIAGNOSIS — I2510 Atherosclerotic heart disease of native coronary artery without angina pectoris: Secondary | ICD-10-CM | POA: Insufficient documentation

## 2018-07-13 DIAGNOSIS — R7989 Other specified abnormal findings of blood chemistry: Principal | ICD-10-CM

## 2018-07-13 NOTE — Progress Notes (Signed)
Cardiology Office Note:    Date:  07/14/2018   ID:  Heather Lutz [birth year], MRN [id number]  PCP:  Karleen Hampshire., MD  Cardiologist:  Shirlee More, MD   Referring MD: [name]., MD  ASSESSMENT:    1. Elevated troponin   2. LBBB (left bundle branch block)   3. LVH (left ventricular hypertrophy)   4. Coronary artery calcification seen on CAT scan    PLAN:    In order of problems listed above:  1. Troponin elevation was not ischemic in nature related to bacteremia and acute medical illness. 2. Stable pattern today's EKG 3. BP is at target no evidence of left ventricular dysfunction and does not require any specific drug therapy 4. Not uncommon on CT scan in her age group no indication of cardiac ischemia  Next appointment 6 months   Medication Adjustments/Labs and Tests Ordered: Current medicines are reviewed at length with the patient today.  Concerns regarding medicines are outlined above.  No orders of the defined types were placed in this encounter.  No orders of the defined types were placed in this encounter.    Chief Complaint  Patient presents with  . Follow-up    elevated troponin    History of Present Illness:    Heather Lutz is a 72 y.o. female who is being seen today for the evaluation of elevated troponin in setting of medical illness with enterococcal bacteremia and E. coli E. coli urinary tract infection treated with IV antibiotics at the request of [name]., MD.    Ref Range & Units 2wk ago (06/26/18) 2wk ago (06/26/18) 2wk ago (06/25/18)  Troponin I <0.03 ng/mL 0.19High Panic   0.10High Panic  CM 0.06High Panic  CM    Echocardiogram performed 06/26/2018 showed normal left ventricular function Er 65 to 70% moderate concentric LVH and no significant valvular disease.  Her EKG previously showed left bundle branch block with the same  pattern during admission but she had marked sinus tachycardia.  CT of the chest showed  no evidence of pulmonary embolism but there was calcification of the coronary vessels left anterior descending and right coronary artery.  She has a history of a remote heart catheterization 20 years ago and normal coronary arteriography.  During this recent hospitalization she had no chest pain her troponin was low level no evidence of myocardial infarction and her echocardiogram showed no segmental left ventricular dysfunction.  She is in rehab is finished IV antibiotics her intentions to return home we discussed the option of doing further testing conventional stress testing nuclear medicine cardiac CTA heart catheterization at this time I do not think that it is indicated and I will see her back in the office in 6 months and assess where she is at life in terms of her chronic illness of multiple sclerosis.  No edema shortness of breath chest pain palpitation or syncope she did have mild coronary artery calcification which is not uncommon  on vascular screening.  Past Medical History:  Diagnosis Date  . Blindness   . GERD (gastroesophageal reflux disease)   . Multiple sclerosis ([place])   . Osteoporosis     Past Surgical History:  Procedure Laterality Date  . ABDOMINAL HYSTERECTOMY    . ESOPHAGOGASTRODUODENOSCOPY N/A 11/06/2012   Procedure: ESOPHAGOGASTRODUODENOSCOPY (EGD);  Surgeon: Lear Ng, MD;  Location: [facility] ENDOSCOPY;  Service: Endoscopy;  Laterality: N/A;    Current Medications: Current Meds  Medication Sig  . aspirin 81  MG chewable tablet Chew 1 tablet (81 mg total) by mouth daily.  . baclofen (LIORESAL) 10 MG tablet Take 1 tablet (10 mg total) by mouth 3 (three) times daily as needed for muscle spasms.  . carvedilol (COREG) 3.125 MG tablet Take 1 tablet (3.125 mg total) by mouth 2 (two) times daily with a meal.  . ferrous sulfate 325 (65 FE) MG tablet Take 1 tablet (325 mg total) by mouth 2 (two) times daily with a meal.  . pantoprazole (PROTONIX) 40 MG tablet Take 1 tablet  (40 mg total) by mouth daily.  . polyethylene glycol (MIRALAX / GLYCOLAX) packet Take 17 g by mouth daily.     Allergies:   Other and Epinephrine   Social History   Socioeconomic History  . Marital status: Married    Spouse name: Not on file  . Number of children: Not on file  . Years of education: Not on file  . Highest education level: Not on file  Occupational History  . Not on file  Social Needs  . Financial resource strain: Not on file  . Food insecurity:    Worry: Not on file    Inability: Not on file  . Transportation needs:    Medical: Not on file    Non-medical: Not on file  Tobacco Use  . Smoking status: Never Smoker  . Smokeless tobacco: Never Used  Substance and Sexual Activity  . Alcohol use: No    Alcohol/week: 0.0 standard drinks  . Drug use: No  . Sexual activity: Not on file  Lifestyle  . Physical activity:    Days per week: Not on file    Minutes per session: Not on file  . Stress: Not on file  Relationships  . Social connections:    Talks on phone: Not on file    Gets together: Not on file    Attends religious service: Not on file    Active member of club or organization: Not on file    Attends meetings of clubs or organizations: Not on file    Relationship status: Not on file  Other Topics Concern  . Not on file  Social History Narrative  . Not on file     Family History: The patient's family history includes CAD in her father. There is no history of Heart attack.  ROS:   ROS Please see the history of present illness.     All other systems reviewed and are negative.  EKGs/Labs/Other Studies Reviewed:    The following studies were reviewed today:   EKG:  EKG is  ordered today.  The ekg ordered today demonstrates SRTh LBBB  Recent Labs: 06/25/2018: TSH 1.286 06/26/2018: B Natriuretic Peptide 181.7 06/27/2018: Magnesium 2.5 06/30/2018: ALT 15 07/06/2018: BUN 12; Creatinine 0.8; Hemoglobin 9.4; Platelets 285; Potassium 4.4; Sodium  140  Recent Lipid Panel    Component Value Date/Time   CHOL 115 06/27/2018 0418   TRIG 89 06/27/2018 0418   HDL 45 06/27/2018 0418   CHOLHDL 2.6 06/27/2018 0418   VLDL 18 06/27/2018 0418   LDLCALC 52 06/27/2018 0418    Physical Exam:    VS:  BP 130/70 (BP Location: Left Arm, Patient Position: Sitting, Cuff Size: Large)   Pulse 80   Ht 5\' 3"  (1.6 m)   Wt 170 lb (77.1 kg)   SpO2 97%   BMI 30.11 kg/m     Wt Readings from Last 3 Encounters:  07/14/18 170 lb (77.1 kg)  07/01/18 178 lb (  80.7 kg)  06/27/18 178 lb 9.2 oz (81 kg)     GEN: looks chronically ill  Well nourished, well developed in no acute distress HEENT: Normal NECK: No JVD; No carotid bruits LYMPHATICS: No lymphadenopathy CARDIAC: Paradoxical S2 RRR, no murmurs, rubs, gallops RESPIRATORY:  Clear to auscultation without rales, wheezing or rhonchi  ABDOMEN: Soft, non-tender, non-distended MUSCULOSKELETAL:  No edema; No deformity  SKIN: Warm and dry NEUROLOGIC:  Alert and oriented x 3 PSYCHIATRIC:  Normal affect     Signed, Shirlee More, MD  07/14/2018 3:43 PM    [facility]

## 2018-07-14 ENCOUNTER — Ambulatory Visit (INDEPENDENT_AMBULATORY_CARE_PROVIDER_SITE_OTHER): Payer: Medicare Other | Admitting: Cardiology

## 2018-07-14 ENCOUNTER — Encounter: Payer: Self-pay | Admitting: Cardiology

## 2018-07-14 DIAGNOSIS — I2510 Atherosclerotic heart disease of native coronary artery without angina pectoris: Secondary | ICD-10-CM | POA: Diagnosis not present

## 2018-07-14 DIAGNOSIS — I517 Cardiomegaly: Secondary | ICD-10-CM

## 2018-07-14 DIAGNOSIS — R778 Other specified abnormalities of plasma proteins: Secondary | ICD-10-CM

## 2018-07-14 DIAGNOSIS — I447 Left bundle-branch block, unspecified: Secondary | ICD-10-CM | POA: Diagnosis not present

## 2018-07-14 DIAGNOSIS — R7989 Other specified abnormal findings of blood chemistry: Secondary | ICD-10-CM

## 2018-07-14 NOTE — Patient Instructions (Addendum)
Medication Instructions:  Your physician has recommended you make the following change in your medication:   STOP carvedilol (coreg)   If you need a refill on your cardiac medications before your next appointment, please call your pharmacy.   Lab work: None  If you have labs (blood work) drawn today and your tests are completely normal, you will receive your results only by: Marland Kitchen MyChart Message (if you have MyChart) OR . A paper copy in the mail If you have any lab test that is abnormal or we need to change your treatment, we will call you to review the results.  Testing/Procedures: You had an EKG today.   Follow-Up: At [facility], you and your health needs are our priority.  As part of our continuing mission to provide you with exceptional heart care, we have created designated Provider Care Teams.  These Care Teams include your primary Cardiologist (physician) and Advanced Practice Providers (APPs -  Physician Assistants and Nurse Practitioners) who all work together to provide you with the care you need, when you need it.  Your physician wants you to follow-up in: 6 months. You will receive a reminder letter in the mail two months in advance. If you don't receive a letter, please call our office to schedule the follow-up appointment.

## 2018-07-18 ENCOUNTER — Non-Acute Institutional Stay (SKILLED_NURSING_FACILITY): Payer: Medicare Other | Admitting: Internal Medicine

## 2018-07-18 ENCOUNTER — Encounter: Payer: Self-pay | Admitting: Internal Medicine

## 2018-07-18 DIAGNOSIS — N390 Urinary tract infection, site not specified: Secondary | ICD-10-CM | POA: Diagnosis not present

## 2018-07-18 DIAGNOSIS — B9620 Unspecified Escherichia coli [E. coli] as the cause of diseases classified elsewhere: Secondary | ICD-10-CM

## 2018-07-18 DIAGNOSIS — E876 Hypokalemia: Secondary | ICD-10-CM

## 2018-07-18 DIAGNOSIS — R7881 Bacteremia: Secondary | ICD-10-CM

## 2018-07-18 DIAGNOSIS — I25118 Atherosclerotic heart disease of native coronary artery with other forms of angina pectoris: Secondary | ICD-10-CM

## 2018-07-18 DIAGNOSIS — B952 Enterococcus as the cause of diseases classified elsewhere: Secondary | ICD-10-CM

## 2018-07-18 DIAGNOSIS — G35 Multiple sclerosis: Secondary | ICD-10-CM

## 2018-07-18 DIAGNOSIS — D508 Other iron deficiency anemias: Secondary | ICD-10-CM

## 2018-07-18 DIAGNOSIS — Z9889 Other specified postprocedural states: Secondary | ICD-10-CM

## 2018-07-18 NOTE — Progress Notes (Signed)
Location:  [facility] Room Number: 110P Place of Service:  SNF (31)  Heather Lutz. Heather Coil, MD  Patient Care Team: Karleen Hampshire., MD as PCP - General (Internal Medicine) Trula Slade, DPM as Consulting Physician (Podiatry)  Extended Emergency Contact Information Primary Emergency Contact: Kyran, [place] Address: [address]          [place] [address] [name] of [place] Phone: [phone] Work Phone: [phone] Mobile Phone: [phone] Relation: None Secondary Emergency Contact: [place] Work Phone: [phone] Mobile Phone: [phone] Relation: Son  Allergies  Allergen Reactions  . Other Itching    Walnuts, honeydew, watermelon, canteloupe, bananas  . Epinephrine Anxiety and Other (See Comments)    Hallucinations     Chief Complaint  Patient presents with  . Discharge Note    Discharge from [facility]    HPI:  72 y.o. female with GERD, MS, who presented to the emergency department with complaint of numbness in her legs as well as achiness and weakness since the prior Friday.  Patient noted fever on the day of admission and increase in frequency of urination.  In the ED patient was noted to have a fever, white count of 13.7, and tachycardia.  Patient was admitted to [facility] from 10/12-17 for work-up of sepsis secondary to UTI.  Patient was treated for enterococcus bacteremia and an E. coli UTI.  Patient was treated IV ampicillin and will continue with a stop date of 07/08/2018.  It was felt that the enterococcal source was likely left-sided ureteral obstruction thought to be chronic with some chronic hydronephrosis.  Patient also had a severe hypomagnesemia hypocalcemia and hypokalemia for all replaced.  Patient had a nonspecific rise in troponin with a flat trend and a non-a CS pattern, likely due to bacteremia and sepsis patient's EKG was nonacute with complete left bundle  branch block consistent with old EKGs but with mild progression; echo with preserved EF of 65% patient is bedbound at baseline secondary to [place].  Patient was admitted to skilled nursing facility for OT/PT and is now ready to be discharged home.    Past Medical History:  Diagnosis Date  . Blindness   . GERD (gastroesophageal reflux disease)   . Multiple sclerosis ([place])   . Osteoporosis     Past Surgical History:  Procedure Laterality Date  . ABDOMINAL HYSTERECTOMY    . ESOPHAGOGASTRODUODENOSCOPY N/A 11/06/2012   Procedure: ESOPHAGOGASTRODUODENOSCOPY (EGD);  Surgeon: Lear Ng, MD;  Location: [facility] ENDOSCOPY;  Service: Endoscopy;  Laterality: N/A;     reports that she has never smoked. She has never used smokeless tobacco. She reports that she does not drink alcohol or use drugs. Social History   Socioeconomic History  . Marital status: Married    Spouse name: Not on file  . Number of children: Not on file  . Years of education: Not on file  . Highest education level: Not on file  Occupational History  . Not on file  Social Needs  . Financial resource strain: Not on file  . Food insecurity:    Worry: Not on file    Inability: Not on file  . Transportation needs:    Medical: Not on file    Non-medical: Not on file  Tobacco Use  . Smoking status: Never Smoker  . Smokeless tobacco: Never Used  Substance and Sexual Activity  . Alcohol use: No    Alcohol/week: 0.0 standard  drinks  . Drug use: No  . Sexual activity: Not on file  Lifestyle  . Physical activity:    Days per week: Not on file    Minutes per session: Not on file  . Stress: Not on file  Relationships  . Social connections:    Talks on phone: Not on file    Gets together: Not on file    Attends religious service: Not on file    Active member of club or organization: Not on file    Attends meetings of clubs or organizations: Not on file    Relationship status: Not on file  . Intimate partner violence:     Fear of current or ex partner: Not on file    Emotionally abused: Not on file    Physically abused: Not on file    Forced sexual activity: Not on file  Other Topics Concern  . Not on file  Social History Narrative  . Not on file    Pertinent  Health Maintenance Due  Topic Date Due  . MAMMOGRAM  05/09/1995  . COLONOSCOPY  05/09/1995  . PNA vac Low Risk Adult (1 of 2 - PCV13) 05/08/2010  . INFLUENZA VACCINE  Completed  . DEXA SCAN  Completed    Medications: Allergies as of 07/18/2018      Reactions   Other Itching   Walnuts, honeydew, watermelon, canteloupe, bananas   Epinephrine Anxiety, Other (See Comments)   Hallucinations       Medication List        Accurate as of 07/18/18  7:41 PM. Always use your most recent med list.          aspirin 81 MG chewable tablet Chew 1 tablet (81 mg total) by mouth daily.   baclofen 10 MG tablet Commonly known as:  LIORESAL Take 1 tablet (10 mg total) by mouth 3 (three) times daily as needed for muscle spasms.   carvedilol 3.125 MG tablet Commonly known as:  COREG Take 3.125 mg by mouth 2 (two) times daily with a meal.   ferrous sulfate 325 (65 FE) MG tablet Take 1 tablet (325 mg total) by mouth 2 (two) times daily with a meal.   pantoprazole 40 MG tablet Commonly known as:  PROTONIX Take 1 tablet (40 mg total) by mouth daily.   polyethylene glycol packet Commonly known as:  MIRALAX / GLYCOLAX Take 17 g by mouth daily.        Vitals:   07/18/18 1344  BP: 135/76  Pulse: 77  Resp: 19  Temp: 98.1 F (36.7 C)  Weight: 170 lb (77.1 kg)  Height: 5\' 3"  (1.6 m)   Body mass index is 30.11 kg/m.  Physical Exam  GENERAL APPEARANCE: Alert, conversant. No acute distress.  HEENT: Unremarkable. RESPIRATORY: Breathing is even, unlabored. Lung sounds are clear   CARDIOVASCULAR: Heart RRR no murmurs, rubs or gallops. No peripheral edema.  GASTROINTESTINAL: Abdomen is soft, non-tender, not distended w/ normal bowel  sounds.  NEUROLOGIC: Cranial nerves 2-12 grossly intact. Moves all extremities   Labs reviewed: Basic Metabolic Panel: Recent Labs    06/26/18 0352 06/27/18 0418 06/28/18 0602 06/29/18 0415 06/30/18 0354 07/06/18  NA 138 139 141 140 140 140  K 2.9* 4.3 3.7 3.6 3.6 4.4  CL 116* 111 109 108 106  --   CO2 18* 21* 24 26 24   --   GLUCOSE 85 90 86 88 88  --   BUN 13 13 13 10 9 12   CREATININE  0.90 1.14* 0.88 0.80 0.92 0.8  CALCIUM 6.4* 8.6* 8.8* 8.8* 8.9  --   MG 1.2* 2.5*  --   --   --   --    No results found for: [facility] Liver Function Tests: Recent Labs    06/28/18 0602 06/29/18 0415 06/30/18 0354  AST 17 19 24   ALT 11 12 15   ALKPHOS 80 86 87  BILITOT 0.5 0.5 0.6  PROT 6.3* 6.6 6.5  ALBUMIN 2.4* 2.4* 2.4*   Recent Labs    09/21/17 1355 06/25/18 1554  LIPASE 28 23   No results for input(s): AMMONIA in the last 8760 hours. CBC: Recent Labs    09/21/17 1355 06/25/18 1554 06/26/18 0352 06/27/18 0418 07/06/18  WBC 5.2 13.7* 15.0* 10.9* 7.2  NEUTROABS 3.1 11.7*  --   --   --   HGB 10.6* 9.7* 7.5* 8.1* 9.4*  HCT 33.2* 31.8* 25.1* 27.5* 30*  MCV 72.3* 70.5* 70.3* 69.6*  --   PLT 260 204 179 180 285   Lipid Recent Labs    06/27/18 0418  CHOL 115  HDL 45  LDLCALC 52  TRIG 89   Cardiac Enzymes: Recent Labs    06/25/18 2207 06/26/18 0352 06/26/18 1119  TROPONINI 0.06* 0.10* 0.19*   BNP: Recent Labs    06/26/18 0352  BNP 181.7*   CBG: Recent Labs    06/30/18 0409  GLUCAP 80    Procedures and Imaging Studies During Stay: Ct Head Wo Contrast  Result Date: 06/25/2018 CLINICAL DATA:  Neurologic deficit, subacute. Worsening muscle spasms. EXAM: CT HEAD WITHOUT CONTRAST TECHNIQUE: Contiguous axial images were obtained from the base of the skull through the vertex without intravenous contrast. COMPARISON:  None. FINDINGS: Brain: No evidence of acute infarction, hemorrhage, hydrocephalus, extra-axial collection or mass lesion/mass effect. There  is mild diffuse low-attenuation within the subcortical and periventricular white matter compatible with chronic microvascular disease. Vascular: No hyperdense vessel or unexpected calcification. Skull: Normal. Negative for fracture or focal lesion. Sinuses/Orbits: The paranasal sinuses and mastoid air cells are clear. Within the anterior left globe there is a 2 mm calcific or metallic foreign body of uncertain etiology. A normal appearing left lens is not visualized and this may be postsurgical. Other: None IMPRESSION: 1. No acute intracranial abnormalities. 2. Chronic small vessel ischemic change. 3. Small radiopaque foreign body within the anterior left globe is identified of uncertain clinical significance. This may be postsurgical or posttraumatic in etiology. Clinical correlation is advised. Electronically Signed   By: Kerby Moors M.D.   On: 06/25/2018 17:08   Ct Angio Chest Pe W Or Wo Contrast  Result Date: 06/26/2018 CLINICAL DATA:  72 year old female with history of weakness in her extremities. Positive D-dimer. EXAM: CT ANGIOGRAPHY CHEST WITH CONTRAST TECHNIQUE: Multidetector CT imaging of the chest was performed using the standard protocol during bolus administration of intravenous contrast. Multiplanar CT image reconstructions and MIPs were obtained to evaluate the vascular anatomy. CONTRAST:  146mL ISOVUE-370 IOPAMIDOL (ISOVUE-370) INJECTION 76% COMPARISON:  Chest CT 11/17/2014. FINDINGS: Cardiovascular: No filling defects in the pulmonary arterial tree to suggest underlying pulmonary embolism. Heart size is borderline enlarged. There is no significant pericardial fluid, thickening or pericardial calcification. There is aortic atherosclerosis, as well as atherosclerosis of the great vessels of the mediastinum and the coronary arteries, including calcified atherosclerotic plaque in the left anterior descending and right coronary arteries. Mediastinum/Nodes: No pathologically enlarged mediastinal  or hilar lymph nodes. Large hiatal hernia. No axillary lymphadenopathy. Lungs/Pleura: 5 mm  right upper lobe nodule (axial image 54 of series 7), unchanged compared to 2016, considered definitively benign. No other suspicious pulmonary nodules or masses are noted. Chronic scarring in the lower lobes of the lungs bilaterally, similar to the prior examination from 2016. No acute consolidative airspace disease. No pleural effusions. Upper Abdomen: Unremarkable. Musculoskeletal: There are no aggressive appearing lytic or blastic lesions noted in the visualized portions of the skeleton. Review of the MIP images confirms the above findings. IMPRESSION: 1. No evidence of pulmonary embolism. 2. No acute findings in the thorax to account for the patient's symptoms. 3. Large hiatal hernia. 4. Aortic atherosclerosis, in addition to 2 vessel coronary artery disease. Assessment for potential risk factor modification, dietary therapy or pharmacologic therapy may be warranted, if clinically indicated. Aortic Atherosclerosis (ICD10-I70.0). Electronically Signed   By: Vinnie Langton M.D.   On: 06/26/2018 02:42   Ct Abdomen Pelvis W Contrast  Result Date: 06/28/2018 CLINICAL DATA:  Abdominal pain. Diverticulitis suspected. Bacteremia and sepsis. EXAM: CT ABDOMEN AND PELVIS WITH CONTRAST TECHNIQUE: Multidetector CT imaging of the abdomen and pelvis was performed using the standard protocol following bolus administration of intravenous contrast. CONTRAST:  161mL ISOVUE-370 IOPAMIDOL (ISOVUE-370) INJECTION 76% COMPARISON:  Plain films 06/28/2018.  CT 09/21/2017. FINDINGS: Lower chest: Bibasilar atelectasis. Normal heart size with trace bilateral pleural fluid. Moderate hiatal hernia. Hepatobiliary: Inferior right hepatic lobe hypoattenuating lesion is likely a cyst. Borderline gallbladder distention, without calcified stone or surrounding inflammation. No biliary duct dilatation. Pancreas: Mild degradation secondary to patient  left arm position, not raised above the head. There is likely minimal concurrent motion degradation. Normal, without mass or ductal dilatation. Spleen: Normal in size, without focal abnormality. Adrenals/Urinary Tract: Normal adrenal glands. Interpolar right renal subcentimeter low-density lesion is likely a cyst. There is an interpolar 3.5 cm left renal cyst. Left larger than right collecting system calculi. The largest left-sided stone is in the lower pole region at 6 mm. Moderate left-sided hydronephrosis with delayed contrast excretion from the left kidney. This continues to the level of a proximal left ureteric 1.4 cm stone on coronal image 96. Normal urinary bladder. Stomach/Bowel: Normal remainder of the stomach. Colonic stool burden suggests constipation. Normal terminal ileum and appendix. Normal small bowel. Vascular/Lymphatic: Aortic atherosclerosis. No abdominopelvic adenopathy. Reproductive: Hysterectomy.  No adnexal mass. Other: Trace cul-de-sac fluid, new or increased since the prior. No free intraperitoneal air. Musculoskeletal: Disc bulge at L4-5. IMPRESSION: 1. Moderate left-sided urinary tract obstruction secondary to a 1.4 cm proximal left ureteric stone. 2. Bilateral nephrolithiasis. 3.  Possible constipation. 4. New or increased small volume cul-de-sac fluid. 5. Hiatal hernia. 6. Mild limitations as detailed above. Electronically Signed   By: Abigail Miyamoto M.D.   On: 06/28/2018 11:58   US Venous Img Lower Bilateral  Result Date: 06/25/2018 CLINICAL DATA:  Bilateral lower extremity swelling with right thigh pain EXAM: BILATERAL LOWER EXTREMITY VENOUS DOPPLER ULTRASOUND TECHNIQUE: Gray-scale sonography with graded compression, as well as color Doppler and duplex ultrasound were performed to evaluate the lower extremity deep venous systems from the level of the common femoral vein and including the common femoral, femoral, profunda femoral, popliteal and calf veins including the posterior  tibial, peroneal and gastrocnemius veins when visible. The superficial great saphenous vein was also interrogated. Spectral Doppler was utilized to evaluate flow at rest and with distal augmentation maneuvers in the common femoral, femoral and popliteal veins. COMPARISON:  None. FINDINGS: RIGHT LOWER EXTREMITY Common Femoral Vein: No evidence of thrombus. Normal compressibility, respiratory  phasicity and response to augmentation. Saphenofemoral Junction: No evidence of thrombus. Normal compressibility and flow on color Doppler imaging. Profunda Femoral Vein: No evidence of thrombus. Normal compressibility and flow on color Doppler imaging. Femoral Vein: No evidence of thrombus. Normal compressibility, respiratory phasicity and response to augmentation. Popliteal Vein: No evidence of thrombus. Normal compressibility, respiratory phasicity and response to augmentation. Calf Veins: No evidence of thrombus. Normal compressibility and flow on color Doppler imaging. Superficial Great Saphenous Vein: No evidence of thrombus. Normal compressibility. Venous Reflux:  None. Other Findings:  None. LEFT LOWER EXTREMITY Common Femoral Vein: No evidence of thrombus. Normal compressibility, respiratory phasicity and response to augmentation. Saphenofemoral Junction: No evidence of thrombus. Normal compressibility and flow on color Doppler imaging. Profunda Femoral Vein: No evidence of thrombus. Normal compressibility and flow on color Doppler imaging. Femoral Vein: No evidence of thrombus. Normal compressibility, respiratory phasicity and response to augmentation. Popliteal Vein: No evidence of thrombus. Normal compressibility, respiratory phasicity and response to augmentation. Calf Veins: No evidence of thrombus. Normal compressibility and flow on color Doppler imaging. Superficial Great Saphenous Vein: No evidence of thrombus. Normal compressibility. Venous Reflux:  None. Other Findings:  None. IMPRESSION: No evidence of deep  venous thrombosis in either lower extremity. Electronically Signed   By: Ilona Sorrel M.D.   On: 06/25/2018 18:07   Dg Chest Port 1 View  Result Date: 06/26/2018 CLINICAL DATA:  Shortness of breath. EXAM: PORTABLE CHEST 1 VIEW COMPARISON:  CT chest from same day.  Chest x-ray from yesterday. FINDINGS: The heart size and mediastinal contours are within normal limits. Normal pulmonary vascularity. Unchanged linear scarring at the left lung base. No focal consolidation, pleural effusion, or pneumothorax. No acute osseous abnormality. Unchanged moderate hiatal hernia. IMPRESSION: No active disease. Electronically Signed   By: Titus Dubin M.D.   On: 06/26/2018 16:54   Dg Chest Port 1 View  Result Date: 06/25/2018 CLINICAL DATA:  Generalized pain EXAM: PORTABLE CHEST 1 VIEW COMPARISON:  09/21/2017 chest radiograph. FINDINGS: Stable cardiomediastinal silhouette with normal heart size and moderate hiatal hernia. No pneumothorax. No pleural effusion. Stable minimal left basilar scarring versus atelectasis. No pulmonary edema. No acute consolidative airspace disease. IMPRESSION: 1. Minimal scarring versus atelectasis at the left lung base. Otherwise no active cardiopulmonary disease. 2. Moderate hiatal hernia. Electronically Signed   By: Ilona Sorrel M.D.   On: 06/25/2018 17:03   Dg Abd Portable 1v  Result Date: 06/28/2018 CLINICAL DATA:  Abdominal pain. Hx of GERD. EXAM: PORTABLE ABDOMEN - 1 VIEW COMPARISON:  09/21/2017 FINDINGS: Stomach and small bowel are nondilated. There is gaseous distention of the colon. Rectum appears decompressed. Left pelvic phleboliths. Regional bones unremarkable. IMPRESSION: Nonobstructive bowel gas pattern mild gaseous distention of the colon. Electronically Signed   By: Lucrezia Europe M.D.   On: 06/28/2018 08:41   [place] Ekg Site Rite  Result Date: 06/28/2018 If Site Rite image not attached, placement could not be confirmed due to current cardiac rhythm.   Assessment/Plan:     Bacteremia due to Enterococcus  E. coli UTI  Multiple sclerosis (HCC)  Iron deficiency anemia secondary to inadequate dietary iron intake  Hypomagnesemia  Hypocalcemia  Hypokalemia  Coronary artery disease of native artery of native heart with stable angina pectoris ([place])  H/O eye surgery   Patient is being discharged with the following home health services: OT/PT/nursing  Patient is being discharged with the following durable medical equipment: None  Patient has been advised to f/u with their PCP in 1-2  weeks to bring them up to date on their rehab stay.  Social services at facility was responsible for arranging this appointment.  Pt was provided with a 30 day supply of prescriptions for medications and refills must be obtained from their PCP.  For controlled substances, a more limited supply may be provided adequate until PCP appointment only.  Medications have been reconciled.  I am spent greater than 35 minutes;> 50% of time with patient was spent reviewing records, labs, tests and studies, counseling and developing plan of care  Heather Lutz. Heather Coil, MD

## 2018-07-22 ENCOUNTER — Other Ambulatory Visit: Payer: Self-pay

## 2018-07-22 ENCOUNTER — Encounter (HOSPITAL_COMMUNITY): Payer: Self-pay | Admitting: *Deleted

## 2018-07-29 NOTE — Progress Notes (Signed)
Three attempts were made by fax to obtain labs done on 07/25/2018 from office of Dr Rozetta Nunnery.  Faxes located in chart.

## 2018-07-31 MED ORDER — GENTAMICIN SULFATE 40 MG/ML IJ SOLN
5.0000 mg/kg | INTRAVENOUS | Status: AC
  Administered 2018-08-01: 310 mg via INTRAVENOUS
  Filled 2018-07-31: qty 7.75

## 2018-08-01 ENCOUNTER — Ambulatory Visit (HOSPITAL_COMMUNITY)
Admission: RE | Admit: 2018-08-01 | Discharge: 2018-08-01 | Disposition: A | Discharge status: Home / Self Care | Payer: Medicare Other | Source: Ambulatory Visit | Attending: Urology | Admitting: Urology

## 2018-08-01 ENCOUNTER — Ambulatory Visit (HOSPITAL_COMMUNITY): Payer: Medicare Other | Admitting: Registered Nurse

## 2018-08-01 ENCOUNTER — Encounter (HOSPITAL_COMMUNITY)
Admission: RE | Disposition: A | Discharge status: Home / Self Care | Payer: Self-pay | Source: Ambulatory Visit | Attending: Urology

## 2018-08-01 ENCOUNTER — Encounter (HOSPITAL_COMMUNITY): Payer: Self-pay

## 2018-08-01 ENCOUNTER — Ambulatory Visit (HOSPITAL_COMMUNITY): Payer: Medicare Other

## 2018-08-01 DIAGNOSIS — Z7401 Bed confinement status: Secondary | ICD-10-CM | POA: Diagnosis not present

## 2018-08-01 DIAGNOSIS — G35 Multiple sclerosis: Secondary | ICD-10-CM | POA: Insufficient documentation

## 2018-08-01 DIAGNOSIS — N136 Pyonephrosis: Secondary | ICD-10-CM | POA: Insufficient documentation

## 2018-08-01 DIAGNOSIS — Z79899 Other long term (current) drug therapy: Secondary | ICD-10-CM | POA: Diagnosis not present

## 2018-08-01 HISTORY — PX: CYSTOSCOPY WITH RETROGRADE PYELOGRAM, URETEROSCOPY AND STENT PLACEMENT: SHX5789

## 2018-08-01 LAB — BASIC METABOLIC PANEL
Anion gap: 10 (ref 5–15)
BUN: 23 mg/dL (ref 8–23)
CO2: 25 mmol/L (ref 22–32)
Calcium: 9.4 mg/dL (ref 8.9–10.3)
Chloride: 105 mmol/L (ref 98–111)
Creatinine, Ser: 0.94 mg/dL (ref 0.44–1.00)
GFR calc Af Amer: >60 mL/min (ref >60)
GFR, EST NON AFRICAN AMERICAN: 59 mL/min — AB (ref >60)
GLUCOSE: 90 mg/dL (ref 70–99)
POTASSIUM: 3.9 mmol/L (ref 3.5–5.1)
Sodium: 140 mmol/L (ref 135–145)

## 2018-08-01 LAB — CBC
HEMATOCRIT: 39.6 % (ref 36.0–46.0)
HEMOGLOBIN: 12 g/dL (ref 12.0–15.0)
MCH: 24.2 pg — AB (ref 26.0–34.0)
MCHC: 30.3 g/dL (ref 30.0–36.0)
MCV: 80 fL (ref 80.0–100.0)
Platelets: 271 10*3/uL (ref 150–400)
RBC: 4.95 MIL/uL (ref 3.87–5.11)
RDW: 25.9 % — ABNORMAL HIGH (ref 11.5–15.5)
WBC: 6.1 10*3/uL (ref 4.0–10.5)
nRBC: 0 % (ref 0.0–0.2)

## 2018-08-01 SURGERY — CYSTOURETEROSCOPY, WITH RETROGRADE PYELOGRAM AND STENT INSERTION
Anesthesia: General | Laterality: Left

## 2018-08-01 MED ORDER — SODIUM CHLORIDE 0.9 % IR SOLN
Status: DC | PRN
  Administered 2018-08-01: 3000 mL

## 2018-08-01 MED ORDER — DEXAMETHASONE SODIUM PHOSPHATE 10 MG/ML IJ SOLN
INTRAMUSCULAR | Status: AC
  Filled 2018-08-01: qty 1

## 2018-08-01 MED ORDER — IOHEXOL 300 MG/ML  SOLN
INTRAMUSCULAR | Status: DC | PRN
  Administered 2018-08-01: 10 mL

## 2018-08-01 MED ORDER — LIDOCAINE 2% (20 MG/ML) 5 ML SYRINGE
INTRAMUSCULAR | Status: DC | PRN
  Administered 2018-08-01: 60 mg via INTRAVENOUS

## 2018-08-01 MED ORDER — CIPROFLOXACIN IN D5W 400 MG/200ML IV SOLN
INTRAVENOUS | Status: AC
  Filled 2018-08-01: qty 200

## 2018-08-01 MED ORDER — DEXAMETHASONE SODIUM PHOSPHATE 10 MG/ML IJ SOLN
INTRAMUSCULAR | Status: DC | PRN
  Administered 2018-08-01: 4 mg via INTRAVENOUS

## 2018-08-01 MED ORDER — FENTANYL CITRATE (PF) 100 MCG/2ML IJ SOLN
INTRAMUSCULAR | Status: DC | PRN
  Administered 2018-08-01: 50 ug via INTRAVENOUS

## 2018-08-01 MED ORDER — LACTATED RINGERS IV SOLN
INTRAVENOUS | Status: DC
  Administered 2018-08-01: 11:00:00 via INTRAVENOUS

## 2018-08-01 MED ORDER — HYDROCODONE-ACETAMINOPHEN 5-325 MG PO TABS: 1.0000 | ORAL_TABLET | ORAL | 0 refills | Status: AC | PRN

## 2018-08-01 MED ORDER — FENTANYL CITRATE (PF) 100 MCG/2ML IJ SOLN
INTRAMUSCULAR | Status: AC
  Filled 2018-08-01: qty 2

## 2018-08-01 MED ORDER — DIPHENHYDRAMINE HCL 50 MG/ML IJ SOLN
INTRAMUSCULAR | Status: DC | PRN
  Administered 2018-08-01: 6.25 mg via INTRAVENOUS

## 2018-08-01 MED ORDER — PROMETHAZINE HCL 25 MG/ML IJ SOLN: 6.2500 mg | INTRAMUSCULAR | Status: DC | PRN

## 2018-08-01 MED ORDER — ONDANSETRON HCL 4 MG/2ML IJ SOLN
INTRAMUSCULAR | Status: AC
  Filled 2018-08-01: qty 2

## 2018-08-01 MED ORDER — PROPOFOL 10 MG/ML IV BOLUS
INTRAVENOUS | Status: AC
  Filled 2018-08-01: qty 20

## 2018-08-01 MED ORDER — FENTANYL CITRATE (PF) 100 MCG/2ML IJ SOLN: 25.0000 ug | INTRAMUSCULAR | Status: DC | PRN

## 2018-08-01 MED ORDER — ONDANSETRON HCL 4 MG/2ML IJ SOLN
INTRAMUSCULAR | Status: DC | PRN
  Administered 2018-08-01: 4 mg via INTRAVENOUS

## 2018-08-01 MED ORDER — PROPOFOL 10 MG/ML IV BOLUS
INTRAVENOUS | Status: DC | PRN
  Administered 2018-08-01: 150 mg via INTRAVENOUS

## 2018-08-01 MED ORDER — CIPROFLOXACIN HCL 500 MG PO TABS: 500.0000 mg | ORAL_TABLET | Freq: Two times a day (BID) | ORAL | 0 refills | Status: DC

## 2018-08-01 MED ORDER — LIDOCAINE 2% (20 MG/ML) 5 ML SYRINGE
INTRAMUSCULAR | Status: AC
  Filled 2018-08-01: qty 5

## 2018-08-01 MED ORDER — CIPROFLOXACIN IN D5W 400 MG/200ML IV SOLN
INTRAVENOUS | Status: DC | PRN
  Administered 2018-08-01: 400 mg via INTRAVENOUS

## 2018-08-01 MED ORDER — DIPHENHYDRAMINE HCL 50 MG/ML IJ SOLN
INTRAMUSCULAR | Status: AC
  Filled 2018-08-01: qty 1

## 2018-08-01 SURGICAL SUPPLY — 29 items
BAG URINE DRAINAGE (UROLOGICAL SUPPLIES) IMPLANT
BAG URO CATCHER STRL LF (MISCELLANEOUS) ×2 IMPLANT
BASKET LASER NITINOL 1.9FR (BASKET) IMPLANT
BASKET ZERO TIP NITINOL 2.4FR (BASKET) ×2 IMPLANT
CATH INTERMIT  6FR 70CM (CATHETERS) ×2 IMPLANT
CATH URET 5FR 28IN CONE TIP (BALLOONS)
CATH URET 5FR 70CM CONE TIP (BALLOONS) IMPLANT
CLOTH BEACON ORANGE TIMEOUT ST (SAFETY) IMPLANT
COVER WAND RF STERILE (DRAPES) IMPLANT
ELECT REM PT RETURN 15FT ADLT (MISCELLANEOUS) IMPLANT
EXTRACTOR STONE 1.7FRX115CM (UROLOGICAL SUPPLIES) IMPLANT
FIBER LASER FLEXIVA 365 (UROLOGICAL SUPPLIES) IMPLANT
FIBER LASER TRAC TIP (UROLOGICAL SUPPLIES) ×2 IMPLANT
GLOVE BIO SURGEON STRL SZ7.5 (GLOVE) ×2 IMPLANT
GOWN STRL REUS W/TWL LRG LVL3 (GOWN DISPOSABLE) ×2 IMPLANT
GOWN STRL REUS W/TWL XL LVL3 (GOWN DISPOSABLE) ×2 IMPLANT
GUIDEWIRE ANG ZIPWIRE 038X150 (WIRE) IMPLANT
GUIDEWIRE STR DUAL SENSOR (WIRE) ×4 IMPLANT
INFUSOR MANOMETER BAG 3000ML (MISCELLANEOUS) IMPLANT
LOOP CUT BIPOLAR 24F LRG (ELECTROSURGICAL) IMPLANT
MANIFOLD NEPTUNE II (INSTRUMENTS) ×2 IMPLANT
PACK CYSTO (CUSTOM PROCEDURE TRAY) ×2 IMPLANT
SET ASPIRATION TUBING (TUBING) IMPLANT
SHEATH URETERAL 12FRX28CM (UROLOGICAL SUPPLIES) IMPLANT
SHEATH URETERAL 12FRX35CM (MISCELLANEOUS) ×2 IMPLANT
STENT URET 6FRX24 CONTOUR (STENTS) ×2 IMPLANT
SYRINGE IRR TOOMEY STRL 70CC (SYRINGE) IMPLANT
TUBING CONNECTING 10 (TUBING) ×2 IMPLANT
TUBING UROLOGY SET (TUBING) ×2 IMPLANT

## 2018-08-01 NOTE — Discharge Instructions (Signed)

## 2018-08-01 NOTE — Anesthesia Preprocedure Evaluation (Addendum)
Anesthesia Evaluation  Patient identified by MRN, date of birth, ID band Patient awake    Reviewed: Allergy & Precautions, NPO status , Patient's Chart, lab work & pertinent test results  History of Anesthesia Complications Negative for: history of anesthetic complications  Airway Mallampati: II  TM Distance: >3 FB Neck ROM: Full    Dental  (+) Upper Dentures, Implants, Dental Advisory Given   Pulmonary neg pulmonary ROS,    Pulmonary exam normal        Cardiovascular negative cardio ROS Normal cardiovascular exam  Impressions:  - LVEF 65-70%, moderate LVH, normal wall motion, grade 1 DD,   indeterminate LV filling pressure, trivial MR, normal LA size,   trivial TR, RVSP 28 mmHg, normal IVC.   Neuro/Psych MS negative neurological ROS  negative psych ROS   GI/Hepatic Neg liver ROS, GERD  Medicated,  Endo/Other  negative endocrine ROS  Renal/GU negative Renal ROS  negative genitourinary   Musculoskeletal negative musculoskeletal ROS (+)   Abdominal   Peds negative pediatric ROS (+)  Hematology negative hematology ROS (+)   Anesthesia Other Findings   Reproductive/Obstetrics negative OB ROS                            Anesthesia Physical Anesthesia Plan  ASA: III  Anesthesia Plan: General   Post-op Pain Management:    Induction: Intravenous  PONV Risk Score and Plan: 3 and Ondansetron, Dexamethasone and Diphenhydramine  Airway Management Planned: LMA  Additional Equipment:   Intra-op Plan:   Post-operative Plan: Extubation in OR  Informed Consent: I have reviewed the patients History and Physical, chart, labs and discussed the procedure including the risks, benefits and alternatives for the proposed anesthesia with the patient or authorized representative who has indicated his/her understanding and acceptance.   Dental advisory given  Plan Discussed with: CRNA and  Anesthesiologist  Anesthesia Plan Comments:         Anesthesia Quick Evaluation

## 2018-08-01 NOTE — H&P (Signed)
CC: I have kidney stones.  HPI: Heather Lutz is a 72 year-old female established patient who is here for renal calculi.  02/19: The patient was seen in the emergency department for right sided lower abdominal pain. CT scan performed showed a 1 cm ureteropelvic junction calculus with moderate wall thickening in the renal pelvis with mild left hydronephrosis. There was also a 7 mm calculus in the left lower pole. There were no ureteral calculi. There is a tiny renal calculus on the right. She continues to have this vague sharp right lower quadrant abdominal pain for which no cause has been identified.   07/07/18: Patient was admitted from 10/12 through 10/19 for urosepsis with enterococcus bacteremia and Escherichia coli UTI now discharged to rehabilitation facility currently receiving daily infusion via PICC line with ampicillin. This is scheduled to complete tomorrow. Presenting complaint to the emergency department was increased numbness in her legs with achiness and increased weakness as well as fever and increased urinary frequency. CT imaging did indicate an obstructing centimeter size stone in the proximal left ureter previously identified in February of this year in the renal pelvis. Ureteroscopy was recommended at that time as well. Patient has other medical comorbidities including multiple sclerosis which has made her bedbound. Her husband and son are usually her primary caregivers at home. Today she presents with her husband voiding at baseline, denying any turning or painful urination, gross hematuria. She has not had flank or back pain. No recent fever, tolerating oral intake without issue. Patient's most recent creatinine 0.9, during hospital stay it never rose above 1.14. During hospital admission, she did have noted hypo-magnesium, hypocalcemia and hypokalemia which were all replaced.   The problem is on the left side. She is not currently having flank pain, back pain, groin pain, nausea,  vomiting, fever or chills. She has not caught a stone in her urine strainer since her symptoms began.   She has never had surgical treatment for calculi in the past.     CC/HPI: 06/28/18:  CT ABDOMEN AND PELVIS WITH CONTRAST     TECHNIQUE:  Multidetector CT imaging of the abdomen and pelvis was performed  using the standard protocol following bolus administration of  intravenous contrast.     CONTRAST: 170mL ISOVUE-370 IOPAMIDOL (ISOVUE-370) INJECTION 76%     COMPARISON: Plain films 06/28/2018. CT 09/21/2017.     FINDINGS:  Lower chest: Bibasilar atelectasis. Normal heart size with trace  bilateral pleural fluid. Moderate hiatal hernia.     Hepatobiliary: Inferior right hepatic lobe hypoattenuating lesion is  likely a cyst. Borderline gallbladder distention, without calcified  stone or surrounding inflammation. No biliary duct dilatation.     Pancreas: Mild degradation secondary to patient left arm position,  not raised above the head. There is likely minimal concurrent motion  degradation. Normal, without mass or ductal dilatation.     Spleen: Normal in size, without focal abnormality.     Adrenals/Urinary Tract: Normal adrenal glands. Interpolar right  renal subcentimeter low-density lesion is likely a cyst. There is an  interpolar 3.5 cm left renal cyst. Left larger than right collecting  system calculi. The largest left-sided stone is in the lower pole  region at 6 mm.     Moderate left-sided hydronephrosis with delayed contrast excretion  from the left kidney. This continues to the level of a proximal left  ureteric 1.4 cm stone on coronal image 96. Normal urinary bladder.     Stomach/Bowel: Normal remainder of the stomach. Colonic stool burden  suggests constipation. Normal terminal ileum and appendix. Normal  small bowel.     Vascular/Lymphatic: Aortic atherosclerosis. No abdominopelvic  adenopathy.     Reproductive: Hysterectomy. No adnexal mass.     Other:  Trace cul-de-sac fluid, new or increased since the prior. No  free intraperitoneal air.     Musculoskeletal: Disc bulge at L4-5.     IMPRESSION:  1. Moderate left-sided urinary tract obstruction secondary to a 1.4  cm proximal left ureteric stone.  2. Bilateral nephrolithiasis.  3. Possible constipation.  4. New or increased small volume cul-de-sac fluid.  5. Hiatal hernia.  6. Mild limitations as detailed above.     ALLERGIES: Epinephrine    MEDICATIONS: Prilosec  Ampicillin Sodium  Aspir 81  Baclofen 10 mg tablet  Carvedilol 3.125 mg tablet  Ferrous Sulfate  Miralax  Pantoprazole Sodium 40 mg tablet, delayed release     GU PSH: Hysterectomy Unilat SO - 2013      PSH Notes: Hysterectomy   NON-GU PSH: Cataract surgery, Left    GU PMH: Renal calculus - 10/27/2017 Overactive bladder, Overactive bladder - 2014      PMH Notes:  1898-09-14 00:00:00 - Note: Normal Routine History And Physical Senior Citizen (65-80)   NON-GU PMH: Asthma, Asthma - 2014 Multiple sclerosis, Multiple Sclerosis - 2014 Personal history of other diseases of the nervous system and sense organs, History of glaucoma - 2014 Personal history of other specified conditions, History of heartburn - 2014 GERD Glaucoma    FAMILY HISTORY: 1 son - Other Colon Cancer - Brother Diabetes - Mother Family Health Status - Father alive at age 59 - 86 In Family Family Health Status - Mother's Age - Runs In Family Family Health Status Number - Runs In Family Heart Disease - Father Hypertension - Brother Prostate Cancer - Runs In Family   SOCIAL HISTORY: Marital Status: Married Preferred Language: English; Race: Black or African American Current Smoking Status: Patient has never smoked.   Tobacco Use Assessment Completed: Used Tobacco in last 30 days? Does not drink caffeine.     Notes: Marital History - Currently Married, Caffeine Use, Never A Smoker, Alcohol Use, Retired From Work   REVIEW OF  SYSTEMS:    GU Review Female:   Patient reports frequent urination. Patient denies hard to postpone urination, burning /pain with urination, get up at night to urinate, leakage of urine, stream starts and stops, trouble starting your stream, have to strain to urinate, and being pregnant.  Gastrointestinal (Upper):   Patient denies nausea, vomiting, and indigestion/ heartburn.  Gastrointestinal (Lower):   Patient reports constipation. Patient denies diarrhea.  Constitutional:   Patient denies fever, night sweats, weight loss, and fatigue.  Skin:   Patient denies skin rash/ lesion and itching.  Eyes:   Patient denies blurred vision and double vision.  Ears/ Nose/ Throat:   Patient denies sore throat and sinus problems.  Hematologic/Lymphatic:   Patient denies swollen glands and easy bruising.  Cardiovascular:   Patient reports leg swelling. Patient denies chest pains.  Respiratory:   Patient denies cough and shortness of breath.  Endocrine:   Patient denies excessive thirst.  Musculoskeletal:   Patient reports back pain. Patient denies joint pain.  Neurological:   Patient denies headaches and dizziness.  Psychologic:   Patient denies depression and anxiety.   VITAL SIGNS:      07/07/2018 10:22 AM  Weight 152 lb / 68.95 kg  Height 63 in / 160.02 cm  BP 164/83 mmHg  Pulse 80 /min  Temperature 98.2 F / 36.7 C  BMI 26.9 kg/m   MULTI-SYSTEM PHYSICAL EXAMINATION:    Constitutional: Well-nourished. No physical deformities. Normally developed. Good grooming.  Neck: Neck symmetrical, not swollen. Normal tracheal position.  Respiratory: No labored breathing, no use of accessory muscles.   Cardiovascular: Normal temperature, normal extremity pulses, no swelling, no varicosities. PICC line in RUE.  Skin: No paleness, no jaundice  Neurologic / Psychiatric: Oriented to time, oriented to place, oriented to person. No depression, no anxiety, no agitation.  Gastrointestinal: No mass, no tenderness, no  rigidity, non obese abdomen. No CVA tenderness  Eyes: Normal conjunctivae. Normal eyelids.  Musculoskeletal: hAs MS, and is in a wheelchair. Legs are somewhat contracted.      PAST DATA REVIEWED:  Source Of History:  Patient, Family/Caregiver  Records Review:   Previous Hospital Records, Previous Patient Records  Urine Test Review:   Urine Culture  X-Ray Review: C.T. Abdomen/Pelvis: Reviewed Films. Reviewed Report.     PROCEDURES: None   ASSESSMENT:      ICD-10 Details  1 GU:   Renal and ureteral calculus - N20.2 Left   PLAN:            Medications Stop Meds: Eye Drops  Discontinue: 07/07/2018  - Reason: The medication cycle was completed.            Schedule Return Visit/Planned Activity: 1-2 Weeks - Schedule Surgery          Document Letter(s):  Created for Patient: Clinical Summary         Notes:   Prior to her visit today, I reviewed her case with her urologist. I discussed with patient and husband need for definitive intervention to prevent recurrence of the infection and other associated sequelae of obstructive uropathy including pain/discomfort, bothersome lower urinary tract symptoms, worsening renal function. For ureteroscopy I described the risks which include heart attack, stroke, pulmonary embolus, death, bleeding, infection, damage to contiguous structures, positioning injury, ureteral stricture, ureteral avulsion, ureteral injury, need for ureteral stent, inability to perform ureteroscopy, need for an interval procedure, inability to clear stone burden, stent discomfort and pain. All questions answered to the best of my ability with understanding expressed by the patient and her husband. They have elected to proceed with Dr. Purvis Sheffield recommended procedure. Once scheduled, we'll get an order for her facility to perform pre-operative urine culture and sensitivity.    Signed by Jiles Crocker on 07/07/18 at 1:47 PM (EDT

## 2018-08-01 NOTE — Op Note (Signed)
Operative Note  Preoperative diagnosis:  1.  Left ureteral calculus  Postoperative diagnosis: 1.  Left ureteral calculus  Procedure(s): 1.  Cystoscopy with left retrograde pyelogram, left ureteroscopy with laser lithotripsy and ureteral stent placement  Surgeon: Link Snuffer, MD  Assistants: None  Anesthesia: General  Complications: None immediate  EBL: Minimal  Specimens: 1.  None  Drains/Catheters: 1.  6 x 24 double-J ureteral stent  Intraoperative findings: 1.  Normal urethra and bladder 2.  Left retrograde pyelogram revealed evidence of filling defect in the mid ureter consistent with stone.  There was upstream hydronephrosis.  There was a large at least 1 cm calculus in the mid ureter that was fragmented to less than 1 mm fragments and basket extracted.  Indication: 72 year old female with a left ureteral calculus and upstream hydronephrosis elected to undergo the above operation.  Description of procedure:  The patient was identified and consent was obtained.  The patient was taken to the operating room and placed in the supine position.  The patient was placed under general anesthesia.  Perioperative antibiotics were administered.  The patient was placed in dorsal lithotomy.  Patient was prepped and draped in a standard sterile fashion and a timeout was performed.  A 21 French rigid cystoscope was advanced into the urethra and into the bladder.  An open-ended ureteral catheter cannulated the distalmost left ureter and a retrograde pyelogram was performed with findings noted above.  I wire was advanced through the catheter up to the kidney under fluoroscopic guidance.  A semirigid ureteroscope was advanced alongside the wire up to the stone of interest which was fragmented to tiny fragments.  Larger fragments were basket extracted atraumatically.  I then advanced the scope all the way into the kidney and there was no evidence of mass lesion.  There were no other ureteral  calculi.  I advanced a second wire through the scope and withdrew the scope.  I tried the past both the inner and outer portion of the sheath over the wire under continuous fluoroscopic guidance but this met resistance.  I therefore did not inspect the kidney itself but there was only a small stone on CT scan in the lower portion of the kidney so I felt it was unnecessary to be overly aggressive in trying to access the kidney.  The ureter itself was cleared.  There was some inflammatory change around the area of stone impaction.  Otherwise no evidence of significant trauma.  I remove one the wires and backloaded the other wire onto a rigid cystoscope with a bedside into the bladder.  A 6 x 24 double-J ureteral stent was placed in a standard fashion followed by removal of the wire.  Fluoroscopy confirmed proximal placement and direct visualization confirmed a good coil within the bladder.  I drained the bladder withdrew the scope.  The patient tolerated the procedure well and was stable postoperatively.  Plan: Return in 1 to 2 weeks for ureteral stent removal.

## 2018-08-01 NOTE — Transfer of Care (Signed)
Immediate Anesthesia Transfer of Care Note  Patient: Shawnna Pancake [facility]  Procedure(s) Performed: CYSTOSCOPY WITH RETROGRADE PYELOGRAM, URETEROSCOPY, HOLMIUM LASER AND STENT PLACEMENT (Left )  Patient Location: PACU  Anesthesia Type:General  Level of Consciousness: awake, alert , oriented and patient cooperative  Airway & Oxygen Therapy: Patient Spontanous Breathing and Patient connected to face mask oxygen  Post-op Assessment: Report given to RN, Post -op Vital signs reviewed and stable and Patient moving all extremities  Post vital signs: Reviewed and stable  Last Vitals:  Vitals Value Taken Time  BP 130/62 08/01/2018  1:23 PM  Temp    Pulse 77 08/01/2018  1:26 PM  Resp 12 08/01/2018  1:26 PM  SpO2 100 % 08/01/2018  1:26 PM  Vitals shown include unvalidated device data.  Last Pain:  Vitals:   08/01/18 1052  TempSrc:   PainSc: 0-No pain         Complications: No apparent anesthesia complications

## 2018-08-01 NOTE — Anesthesia Postprocedure Evaluation (Signed)
Anesthesia Post Note  Patient: Heather Lutz  Procedure(s) Performed: CYSTOSCOPY WITH RETROGRADE PYELOGRAM, URETEROSCOPY, HOLMIUM LASER AND STENT PLACEMENT (Left )     Patient location during evaluation: PACU Anesthesia Type: General Level of consciousness: sedated Pain management: pain level controlled Vital Signs Assessment: post-procedure vital signs reviewed and stable Respiratory status: spontaneous breathing and respiratory function stable Cardiovascular status: stable Postop Assessment: no apparent nausea or vomiting Anesthetic complications: no    Last Vitals:  Vitals:   08/01/18 1400 08/01/18 1405  BP: (!) 146/78 (!) 146/71  Pulse: 70 67  Resp: 17 16  Temp:  (!) 36.4 C  SpO2: 100% 97%    Last Pain:  Vitals:   08/01/18 1405  TempSrc:   PainSc: 0-No pain                 [place] Hijazi DANIEL

## 2018-08-01 NOTE — Anesthesia Procedure Notes (Signed)
Procedure Name: LMA Insertion Date/Time: 08/01/2018 12:20 PM Performed by: Victoriano Lain, CRNA Pre-anesthesia Checklist: Patient identified, Emergency Drugs available, Suction available, Patient being monitored and Timeout performed Patient Re-evaluated:Patient Re-evaluated prior to induction Oxygen Delivery Method: Circle system utilized Preoxygenation: Pre-oxygenation with 100% oxygen Induction Type: IV induction LMA: LMA with gastric port inserted LMA Size: 4.0 Number of attempts: 1 Placement Confirmation: positive ETCO2 and breath sounds checked- equal and bilateral Tube secured with: Tape Dental Injury: Teeth and Oropharynx as per pre-operative assessment

## 2018-08-02 ENCOUNTER — Encounter (HOSPITAL_COMMUNITY): Payer: Self-pay | Admitting: Urology

## 2019-05-03 IMAGING — CT CT ABD-PELV W/ CM
2 of 5 series · 16 of 46 positions shown, 18 images · IV contrast (Omni 300)
Comparison: Plain films 06/28/2018.  CT 09/21/2017.

CLINICAL DATA: Abdominal pain. Diverticulitis suspected. Bacteremia
and sepsis.

EXAM:
CT ABDOMEN AND PELVIS WITH CONTRAST
TECHNIQUE: Multidetector CT imaging of the abdomen and pelvis was performed
using the standard protocol following bolus administration of
intravenous contrast.
CONTRAST:  100mL UMENZK-CKP IOPAMIDOL (UMENZK-CKP) INJECTION 76%

[Series 3: a/p w/ 5mm · axial · 0.98mm/px · z∈[+816,+1201]mm · 13 of 89 slices shown, 15 images]
[im 6/89  soft-tissue]
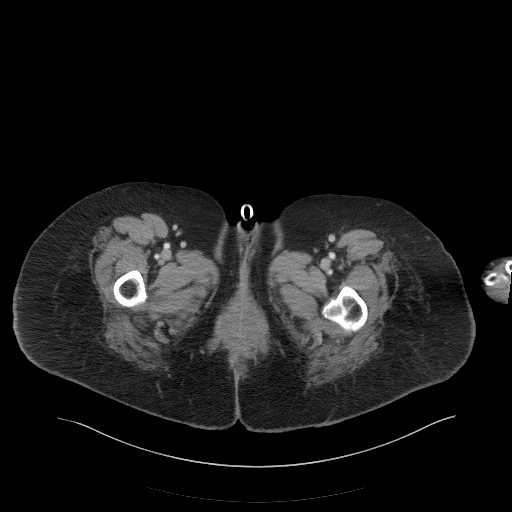
[im 6/89  bone]
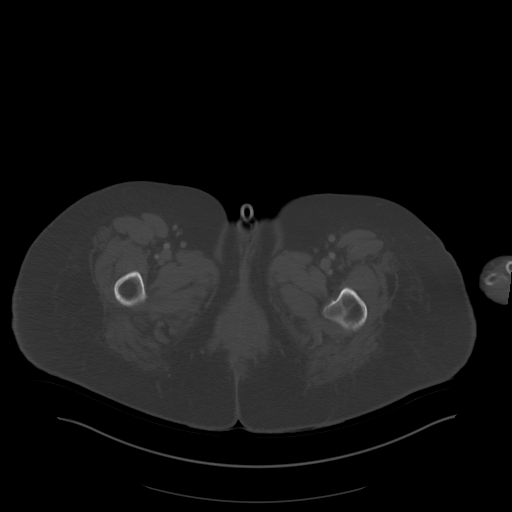
[im 11/89  soft-tissue]
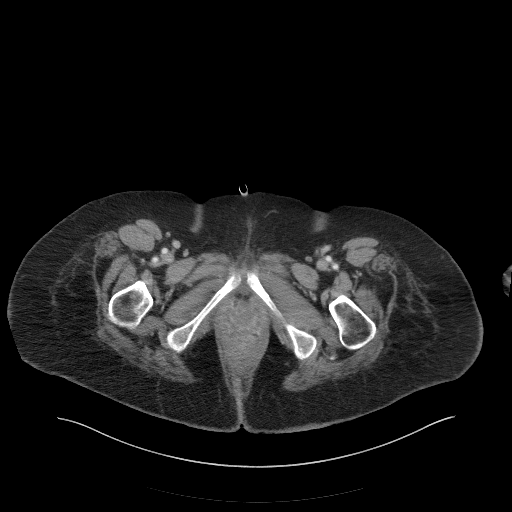
[im 21/89  soft-tissue]
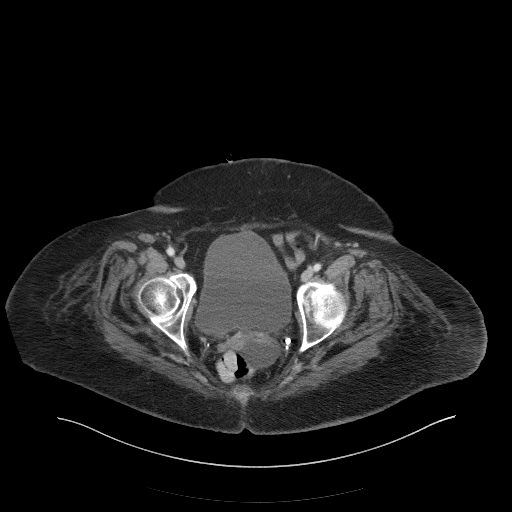
[im 26/89  soft-tissue]
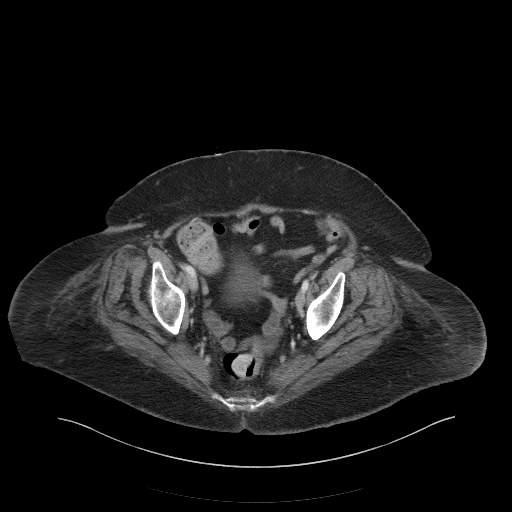
[im 32/89  soft-tissue]
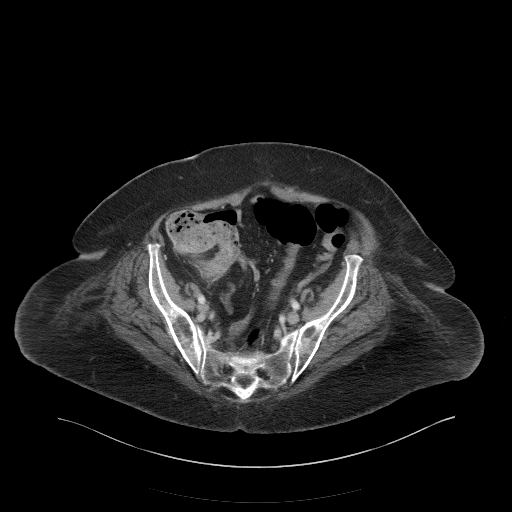
[im 37/89  soft-tissue]
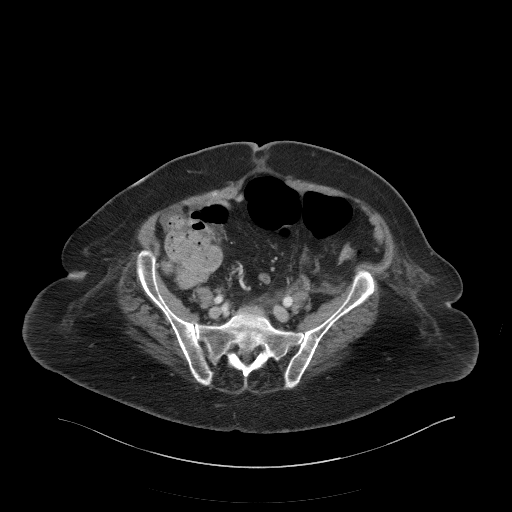
[im 47/89  soft-tissue]
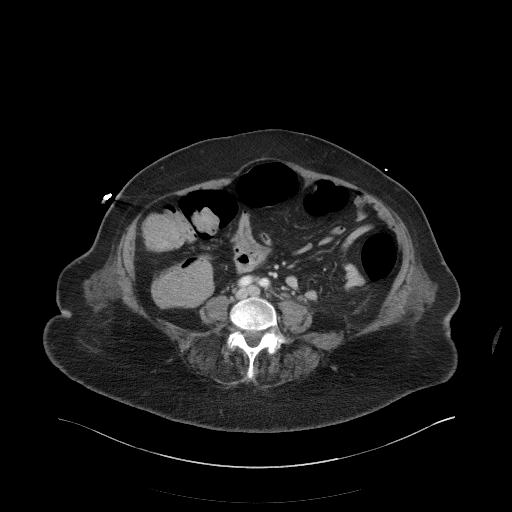
[im 52/89  soft-tissue]
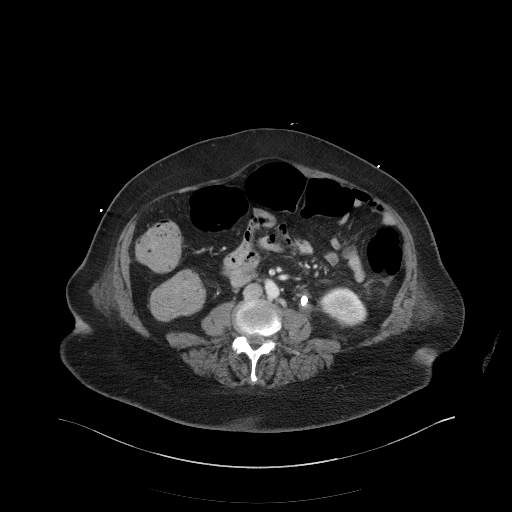
[im 57/89  soft-tissue]
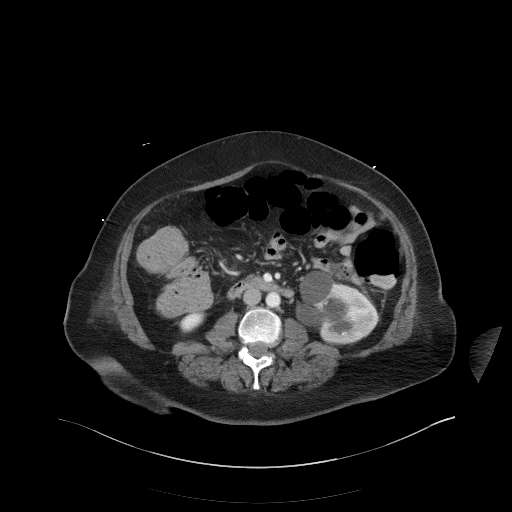
[im 57/89  bone]
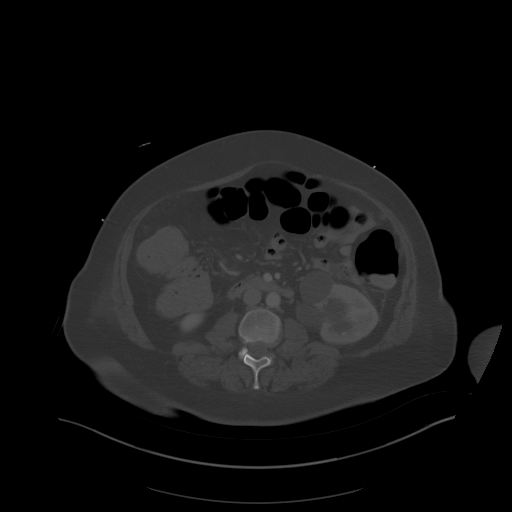
[im 63/89  soft-tissue]
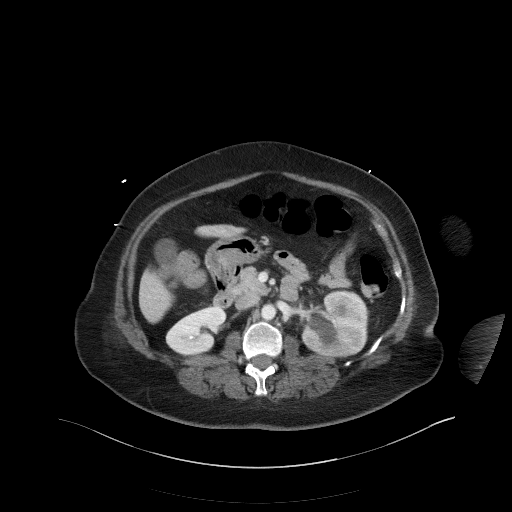
[im 68/89  soft-tissue]
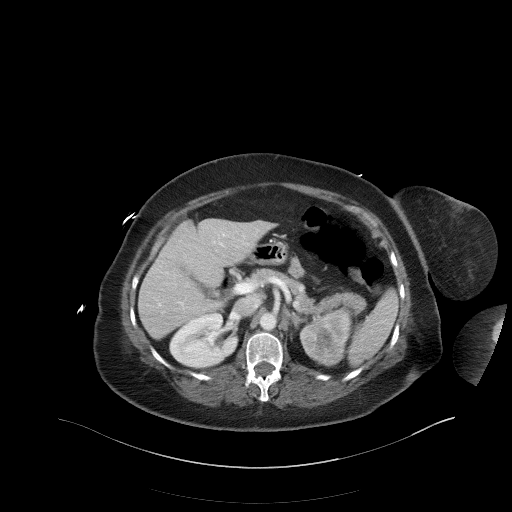
[im 78/89  soft-tissue]
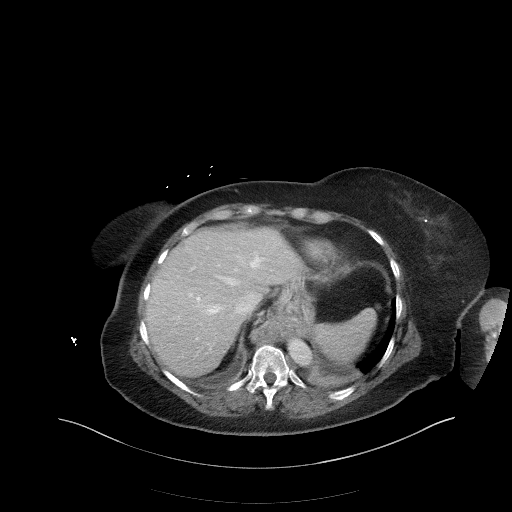
[im 83/89  soft-tissue]
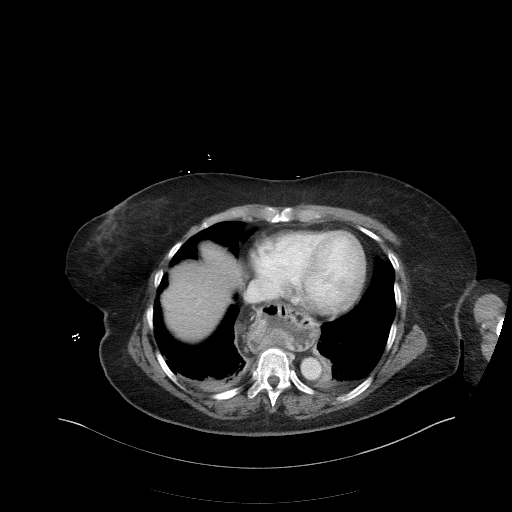

[Series 6: a/p w/ cor · coronal · 0.95mm/px · 3 of 158 slices shown]
[im 53/158  soft-tissue]
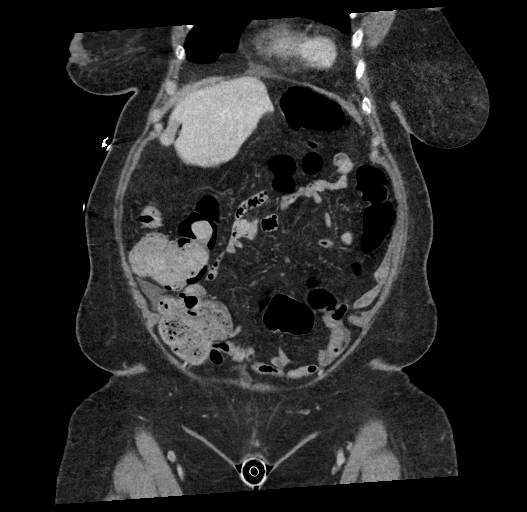
[im 70/158  soft-tissue]
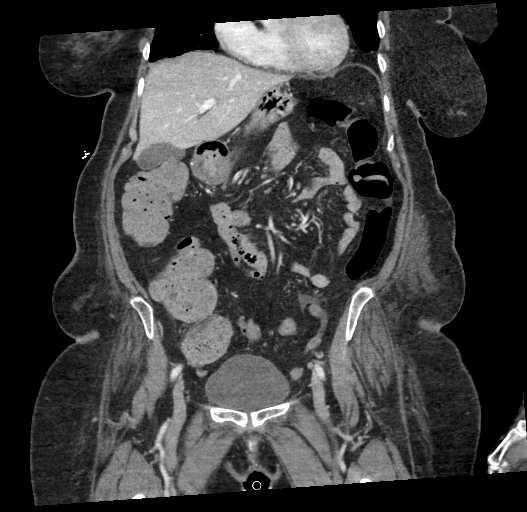
[im 88/158  soft-tissue]
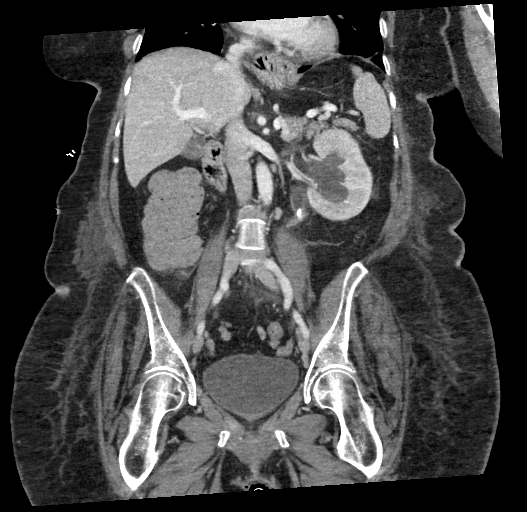

[16 of 46 positions shown; findings below may reference images not displayed]

FINDINGS: Lower chest: Bibasilar atelectasis. Normal heart size with trace
bilateral pleural fluid. Moderate hiatal hernia.

Hepatobiliary: Inferior right hepatic lobe hypoattenuating lesion is
likely a cyst. Borderline gallbladder distention, without calcified
stone or surrounding inflammation. No biliary duct dilatation.

Pancreas: Mild degradation secondary to patient left arm position,
not raised above the head. There is likely minimal concurrent motion
degradation. Normal, without mass or ductal dilatation.

Spleen: Normal in size, without focal abnormality.

Adrenals/Urinary Tract: Normal adrenal glands. Interpolar right
renal subcentimeter low-density lesion is likely a cyst. There is an
interpolar 3.5 cm left renal cyst. Left larger than right collecting
system calculi. The largest left-sided stone is in the lower pole
region at 6 mm.

Moderate left-sided hydronephrosis with delayed contrast excretion
from the left kidney. This continues to the level of a proximal left
ureteric 1.4 cm stone on coronal image 96. Normal urinary bladder.

Stomach/Bowel: Normal remainder of the stomach. Colonic stool burden
suggests constipation. Normal terminal ileum and appendix. Normal
small bowel.

Vascular/Lymphatic: Aortic atherosclerosis. No abdominopelvic
adenopathy.

Reproductive: Hysterectomy.  No adnexal mass.

Other: Trace cul-de-sac fluid, new or increased since the prior. No
free intraperitoneal air.

Musculoskeletal: Disc bulge at L4-5.
IMPRESSION: 1. Moderate left-sided urinary tract obstruction secondary to a
cm proximal left ureteric stone.
2. Bilateral nephrolithiasis.
3.  Possible constipation.
4. New or increased small volume cul-de-sac fluid.
5. Hiatal hernia.
6. Mild limitations as detailed above.

## 2019-08-01 ENCOUNTER — Other Ambulatory Visit: Payer: Self-pay

## 2019-08-01 ENCOUNTER — Encounter: Payer: Self-pay | Admitting: Physical Therapy

## 2019-08-01 ENCOUNTER — Ambulatory Visit: Payer: Medicare Other | Attending: Internal Medicine | Admitting: Physical Therapy

## 2019-08-01 DIAGNOSIS — R29898 Other symptoms and signs involving the musculoskeletal system: Secondary | ICD-10-CM | POA: Insufficient documentation

## 2019-08-01 DIAGNOSIS — R296 Repeated falls: Secondary | ICD-10-CM | POA: Insufficient documentation

## 2019-08-01 DIAGNOSIS — M6281 Muscle weakness (generalized): Secondary | ICD-10-CM | POA: Diagnosis present

## 2019-08-01 NOTE — Therapy (Signed)
[facility] [address]  [place], [place], [address] Phone: [phone]   Fax:  [phone]  Physical Therapy Evaluation  Patient Details  Name: Heather Lutz MRN: [id number] Date of Birth: 29-Apr-1946 Referring Provider (PT): Adline Mango, MD   Encounter Date: 08/01/2019  PT End of Session - 08/01/19 1607    Visit Number  1    Number of Visits  17    Date for PT Re-Evaluation  09/29/19    PT Start Time  1400    PT Stop Time  1450    PT Time Calculation (min)  50 min    Equipment Utilized During Treatment  Gait belt    Activity Tolerance  Patient tolerated treatment well;Patient limited by fatigue    Behavior During Therapy  [facility] for tasks assessed/performed       Past Medical History:  Diagnosis Date  . Blindness   . GERD (gastroesophageal reflux disease)   . Multiple sclerosis ([place])   . Osteoporosis     Past Surgical History:  Procedure Laterality Date  . ABDOMINAL HYSTERECTOMY    . CYSTOSCOPY WITH RETROGRADE PYELOGRAM, URETEROSCOPY AND STENT PLACEMENT Left 08/01/2018   Procedure: CYSTOSCOPY WITH RETROGRADE PYELOGRAM, URETEROSCOPY, HOLMIUM LASER AND STENT PLACEMENT;  Surgeon: Lucas Mallow, MD;  Location: [facility];  Service: Urology;  Laterality: Left;  . ESOPHAGOGASTRODUODENOSCOPY N/A 11/06/2012   Procedure: ESOPHAGOGASTRODUODENOSCOPY (EGD);  Surgeon: Lear Ng, MD;  Location: [facility] ENDOSCOPY;  Service: Endoscopy;  Laterality: N/A;    There were no vitals filed for this visit.   Subjective Assessment - 08/01/19 1551    Subjective  Pt arriving to therapy with her caregiver, Jasmine (CNA 11-3:00). Pt arriving to clinic in a wheelchair. Pt reporting history of falls at least 2 in the last 6 months. Pt also reporting she wishes to walk again, but hasn't walked since 2017. Pt reporting no pain at rest, but reports low back pain and L shoulder pain at times..    Patient is accompained by:  --    caregiver   Pertinent History  MS, blind, Osteoporosis, GERD, venous stasis, h/o eye surgery, h/o sepsis with hospitalization, UTI's, Left BBB, Left Ventiricular Hypertrophy    Limitations  Standing;Walking;Lifting;Other (comment);House hold activities    How long can you sit comfortably?  hours    How long can you stand comfortably?  unable to independently    How long can you walk comfortably?  unable    Patient Stated Goals  "I want to walk again"    Currently in Pain?  No/denies         [facility] PT Assessment - 08/01/19 0001      Assessment   Medical Diagnosis  weakness, h/o falls    Referring Provider (PT)  Adline Mango, MD    Onset Date/Surgical Date  --   progressing over the last several years   Hand Dominance  Right    Prior Therapy  yes      Precautions   Precautions  None      Restrictions   Weight Bearing Restrictions  No      Balance Screen   Has the patient fallen in the past 6 months  Yes    How many times?  2    Has the patient had a decrease in activity level because of a fear of falling?   Yes    Is the patient reluctant to leave their home because of  a fear of falling?   Yes      [place] residence    Living Arrangements  Other (Comment)      Prior Function   Level of Independence  Needs assistance with ADLs;Needs assistance with homemaking;Needs assistance with transfers    Level of Independence - Bath  Maximal    Toileting  Maximal    Dressing  Maximal    Grooming  Maximal      Cognition   Overall Cognitive Status  Within Functional Limits for tasks assessed      Observation/Other Assessments   Focus on Therapeutic Outcomes (FOTO)   deferred      Posture/Postural Control   Posture/Postural Control  Postural limitations    Postural Limitations  Rounded Shoulders;Forward head;Increased lumbar lordosis;Posterior pelvic tilt;Flexed trunk    Posture Comments  tight hip flexors      ROM / Strength   AROM / PROM  / Strength  Strength      Strength   Overall Strength  Deficits    Strength Assessment Site  Shoulder;Hip;Knee    Right/Left Shoulder  Right;Left    Right Shoulder Flexion  4-/5    Right Shoulder Extension  4-/5    Right Shoulder ABduction  4-/5    Right Shoulder External Rotation  4-/5    Left Shoulder Flexion  3+/5    Left Shoulder Extension  3+/5    Left Shoulder ABduction  3+/5    Left Shoulder External Rotation  3+/5    Right/Left Hip  Right;Left    Right Hip Flexion  2+/5    Right Hip ABduction  2+/5    Right Hip ADduction  2+/5    Left Hip Flexion  2+/5    Left Hip ABduction  2+/5    Left Hip ADduction  2+/5    Right/Left Knee  Right;Left    Right Knee Flexion  2/5    Right Knee Extension  4-/5    Left Knee Flexion  2/5    Left Knee Extension  4-/5      Transfers   Transfers  Sit to Stand    Sit to Stand  2: Max assist    Sit to Stand Details  Tactile cues for sequencing;Tactile cues for weight shifting;Tactile cues for posture;Tactile cues for placement      Ambulation/Gait   Gait Comments  Unable to amb at present      Administrator, arts  Other (comment)    Wheelchair Parts Management  Needs assistance    Distance  Pt requires assistance for w/c mobility                Objective measurements completed on examination: See above findings.              PT Education - 08/01/19 1606    Education Details  PT POC, realistic goals and expecatations, HEP    Person(s) Educated  Patient;Caregiver(s)    Methods  Explanation;Demonstration;Other (comment)    Comprehension  Verbalized understanding;Returned demonstration       PT Short Term Goals - 08/01/19 1608      PT SHORT TERM GOAL #1   Title  Pt will be modified independent in her HEP    Time  4    Period  Weeks    Status  New    Target Date  08/30/19  PT SHORT TERM GOAL #2   Title  Pt will be able to tolerate 15 minutes of  exercise without rest break.    Time  6    Period  Weeks    Status  New    Target Date  09/13/19        PT Long Term Goals - 08/01/19 1609      PT LONG TERM GOAL #1   Title  Pt will be able to perform sit to stand with Contact Guard assistance with rolling walker.    Baseline  Mod-max assistance on 08/01/2019    Time  12    Period  Weeks    Status  New    Target Date  10/24/19      PT LONG TERM GOAL #2   Title  Pt will be able to perform sit to stand transfer with minimal assistance using LRAD.    Baseline  max assistance on 08/01/2019    Time  12    Period  Weeks    Status  New      PT LONG TERM GOAL #3   Title  Pt will be able to perform supine to sit modified independently.    Baseline  requires varying assistance from caregivers    Time  12    Period  Weeks    Status  New    Target Date  10/24/19             Plan - 08/01/19 1449    Clinical Impression Statement  Pt is a 73 year old arriving to therpay for physical therapy evaluation with history of MS, weakness, and h/o falls. Pt reporting 2 falls over the last 6 monhts. Pt arriving in a wheelchair with caregiver present during evaluation. Pt presenting with limited moiblity and reporting she wants to walk again after not walking for 3 years. Pt was educated in the purpose and plan for Physical Therapy and how we would set realistic goals and start small with working on more functinal and safe transfers with her caregiver and family. Pt initially requred maximum assistance with sit to stand transfer, but after edu on posture and foot placement, pt was able to stnad with minimal assistance using a gait belt. Pt's caregiver Delana Meyer was trained in proper transfer techniques from wheelchair). Pt presenting with significant weakness in bilateral LE's with some tone present with bilateral knee flexion/hamstrings. Pt with limited flexibility in bilateral hamstring and hip flexors. Pt was issued a HEP for UE strengthening. Pt  could benefit from skilled PT to progress pt's LE strength, functional moiblity, transfer skills and pt/caregiver educaiton. We will address pt's impairments with the below interventions.    Personal Factors and Comorbidities  Comorbidity 3+;Transportation;Age    Comorbidities  MS, blind, Osteoporosis, GERD, venous stasis, h/o eye surgery, h/o sepsis with hospitalization, UTI's, Left BBB, Left Ventiricular Hypertrophy    Examination-Activity Limitations  Bathing;Bed Mobility;Dressing;Sit;Transfers;Toileting;Stand    Examination-Participation Restrictions  Other;Community Activity    Stability/Clinical Decision Making  Evolving/Moderate complexity    Clinical Decision Making  Moderate    Rehab Potential  Fair    PT Frequency  2x / week    PT Duration  8 weeks    PT Treatment/Interventions  ADLs/Self Care Home Management;Therapeutic activities;Gait training;Functional mobility training;Neuromuscular re-education;Balance training;Therapeutic exercise;Patient/family education;Wheelchair mobility training;Manual techniques;Passive range of motion;Energy conservation;Cryotherapy;Moist Heat    PT Next Visit Plan  transfers (sit to stand, w/c to mat), LE strengthening, UE strengthening    PT  Home Exercise Plan  Access Code: [email or web address] (rows with red therdand, trunk flexion/forward reaching with activation of quads for standing)    Consulted and Agree with Plan of Care  Patient;Family member/caregiver    Family Member Consulted  [place] (CNA)       Patient will benefit from skilled therapeutic intervention in order to improve the following deficits and impairments:  Impaired flexibility, Decreased balance, Decreased activity tolerance, Decreased range of motion, Decreased strength, Postural dysfunction, Decreased mobility, Pain, Improper body mechanics  Visit Diagnosis: Repeated falls  Muscle weakness (generalized)  Impaired flexibility of lower extremity     Problem List Patient Active Problem  List   Diagnosis Date Noted  . Elevated troponin 07/13/2018  . LBBB (left bundle branch block) 07/13/2018  . LVH (left ventricular hypertrophy) 07/13/2018  . Coronary artery calcification seen on CAT scan 07/13/2018  . Bacteremia due to Enterococcus 07/02/2018  . E. coli UTI 07/02/2018  . Iron deficiency anemia 07/02/2018  . Hypomagnesemia 07/02/2018  . Hypocalcemia 07/02/2018  . Hypokalemia 07/02/2018  . Coronary artery disease 07/02/2018  . H/O eye surgery 07/02/2018  . Abdominal pain   . Acute lower UTI 06/25/2018  . Sepsis ([place]) 06/25/2018  . Acute cystitis with hematuria   . Venous stasis 11/20/2014  . Lung nodule < 6cm on CT 11/20/2014  . Left lower lobe pneumonia 11/17/2014  . Influenza A (H1N1) 11/06/2012  . Hematemesis 11/06/2012  . CAP (community acquired pneumonia) 11/05/2012  . Hypoxemia 11/05/2012  . SOB (shortness of breath) 11/05/2012  . Multiple sclerosis ([place]) 11/05/2012    Oretha Caprice, PT 08/01/2019, 6:37 PM  [facility] [address]  [place], [place], [address] Phone: [phone]   Fax:  [phone]  Name: Heather Lutz MRN: [id number] Date of Birth: [birth year]

## 2019-08-08 ENCOUNTER — Ambulatory Visit: Payer: Medicare Other | Admitting: Physical Therapy

## 2019-08-14 ENCOUNTER — Ambulatory Visit: Payer: Medicare Other | Admitting: Physical Therapy

## 2019-09-04 DIAGNOSIS — M5416 Radiculopathy, lumbar region: Secondary | ICD-10-CM | POA: Insufficient documentation

## 2019-09-05 ENCOUNTER — Ambulatory Visit: Payer: Medicare Other | Admitting: Physical Therapy

## 2019-09-07 ENCOUNTER — Ambulatory Visit: Payer: Medicare Other | Admitting: Physical Therapy

## 2019-09-12 ENCOUNTER — Other Ambulatory Visit: Payer: Self-pay

## 2019-09-12 ENCOUNTER — Encounter: Payer: Self-pay | Admitting: Physical Therapy

## 2019-09-12 ENCOUNTER — Ambulatory Visit: Payer: Medicare Other | Attending: Internal Medicine | Admitting: Physical Therapy

## 2019-09-12 VITALS — BP 121/74 | HR 76

## 2019-09-12 DIAGNOSIS — R29898 Other symptoms and signs involving the musculoskeletal system: Secondary | ICD-10-CM | POA: Insufficient documentation

## 2019-09-12 DIAGNOSIS — M6281 Muscle weakness (generalized): Secondary | ICD-10-CM | POA: Insufficient documentation

## 2019-09-12 DIAGNOSIS — R296 Repeated falls: Secondary | ICD-10-CM | POA: Diagnosis present

## 2019-09-12 NOTE — Addendum Note (Signed)
Addended by: Janene Harvey D on: 09/12/2019 12:27 PM   Modules accepted: Orders

## 2019-09-12 NOTE — Therapy (Signed)
[facility] [address]  [place], [place], [address] Phone: [phone]   Fax:  [phone]  Physical Therapy Treatment  Patient Details  Name: Heather Lutz MRN: [id number] Date of Birth: 1945-11-01 Referring Provider (PT): Adline Mango, MD   Encounter Date: 09/12/2019  PT End of Session - 09/12/19 1155    Visit Number  2    Number of Visits  18    Date for PT Re-Evaluation  11/07/19    Authorization Type  [employer] Medicare    PT Start Time  1101    PT Stop Time  1150    PT Time Calculation (min)  49 min    Equipment Utilized During Treatment  Gait belt    Activity Tolerance  Patient tolerated treatment well    Behavior During Therapy  [facility] for tasks assessed/performed       Past Medical History:  Diagnosis Date  . Blindness   . GERD (gastroesophageal reflux disease)   . Multiple sclerosis ([place])   . Osteoporosis     Past Surgical History:  Procedure Laterality Date  . ABDOMINAL HYSTERECTOMY    . CYSTOSCOPY WITH RETROGRADE PYELOGRAM, URETEROSCOPY AND STENT PLACEMENT Left 08/01/2018   Procedure: CYSTOSCOPY WITH RETROGRADE PYELOGRAM, URETEROSCOPY, HOLMIUM LASER AND STENT PLACEMENT;  Surgeon: Lucas Mallow, MD;  Location: [facility];  Service: Urology;  Laterality: Left;  . ESOPHAGOGASTRODUODENOSCOPY N/A 11/06/2012   Procedure: ESOPHAGOGASTRODUODENOSCOPY (EGD);  Surgeon: Lear Ng, MD;  Location: [facility] ENDOSCOPY;  Service: Endoscopy;  Laterality: N/A;    Vitals:   09/12/19 1105  BP: 121/74  Pulse: 76  SpO2: 94%    Subjective Assessment - 09/12/19 1101    Subjective  Present with Caregiver, Jasmine. Had a reaction to some shots she received which caused SOB and total body weakness. MD warned her about these side effects but she has not followed up.  Almost back to normal now. Had been working on HEP up until receiving her shot.    Pertinent History  MS, blind, Osteoporosis, GERD, venous stasis,  h/o eye surgery, h/o sepsis with hospitalization, UTI's, Left BBB, Left Ventiricular Hypertrophy    Patient Stated Goals  "I want to walk again"    Currently in Pain?  No/denies         [facility] PT Assessment - 09/12/19 0001      Assessment   Medical Diagnosis  weakness, h/o falls    Referring Provider (PT)  Adline Mango, MD    Onset Date/Surgical Date  --   several years                  [facility] Adult PT Treatment/Exercise - 09/12/19 0001      Transfers   Transfers  Sit to Stand    Sit to Stand  3: Mod assist    Comments  mod A x2 and instruction on set up and use of B UEs to push up from arm rests      Exercises   Exercises  Knee/Hip;Shoulder      Knee/Hip Exercises: Seated   Long Arc Quad  Strengthening;Right;Left;1 set;5 reps    Long Arc Quad Weight  1 lbs.    Long Arc Quad Limitations  limited ROM B LEs    Ball Squeeze  5x5"   limited activation of R LE     Shoulder Exercises: Seated   Row  Strengthening;Both;10 reps;Theraband    Theraband Level (Shoulder Row)  Level 1 (  Yellow);Level 2 (Red)    Row Limitations  10x yellow, 10x red; cues to maintain elbows down    External Rotation  Strengthening;Both;10 reps;Theraband    Theraband Level (Shoulder External Rotation)  Level 1 (Yellow)    External Rotation Limitations  2x10   cues to improve control and speed   Other Seated Exercises  ab set 2 sets 5x5" with beach ball in lap             PT Education - 09/12/19 1154    Education Details  advised patient to f/u with MD about side effects experienced after hip injection; update to HEP and administered red TB    Person(s) Educated  Patient;Caregiver(s)   Jasmine   Methods  Explanation;Demonstration;Tactile cues;Handout;Verbal cues    Comprehension  Verbalized understanding;Returned demonstration       PT Short Term Goals - 09/12/19 1109      PT SHORT TERM GOAL #1   Title  Pt will be modified independent in her HEP    Time  4    Period  Weeks     Status  On-going   reporting compliance until recent exaccerbation of weakness   Target Date  10/10/19      PT SHORT TERM GOAL #2   Title  Pt will be able to tolerate 15 minutes of exercise without rest break.    Time  4    Period  Weeks    Status  Partially Met   reporting 10 min before requiring rest break   Target Date  10/10/19        PT Long Term Goals - 09/12/19 1111      PT LONG TERM GOAL #1   Title  Pt will be able to perform sit to stand with Contact Guard assistance with rolling walker.    Baseline  Mod-max assistance on 08/01/2019    Time  8    Period  Weeks    Status  On-going   requiring mod A x 2 without RW   Target Date  11/07/19      PT LONG TERM GOAL #2   Title  Pt will be able to perform sit to stand transfer with minimal assistance using LRAD.    Baseline  max assistance on 08/01/2019    Time  8    Period  Weeks    Status  On-going   requiring mod A x 2 without RW   Target Date  11/07/19      PT LONG TERM GOAL #3   Title  Pt will be able to perform supine to sit modified independently.    Baseline  requires varying assistance from caregivers    Time  8    Period  Weeks    Status  Deferred   NT   Target Date  11/07/19      PT LONG TERM GOAL #4   Title  Patient to demonstrate and recall importance of maintaining more upright sitting posture in W/C and report ability to maintain 10 min of upright sitting.    Time  8    Period  Weeks    Status  New    Target Date  11/07/19            Plan - 09/12/19 1207    Clinical Impression Statement  Patient returning to PT after a 1.5 month long hiatus. Patient noting that she recently received an injection in her hip, causing her to feel weakness all over  her body as well as experience SOB a day after the injection. Advised patient to f/u with MD about these adverse side effects. Patient reported understanding. Patient feels that she has been unable to perform her HEP since the shot d/t this weakness,  but was performing it previously. At this time, she is able to tolerate 10 min of exercise before requiring a break d/t fatigue. Patient today requiring mod A x 2 for STS transfer, with patient also requiring max A for scooting and set up. Patient demonstrating impaired sitting balance without back support d/t core weakness. Thus, worked on core and LE strengthening ther-ex this session with patient requiring cues for correction of form and muscle control. Updated HEP with exercises that were well-tolerated. Patient and caregiver reported understanding. Patient without complaints at end of session. Patient would benefit from additional skilled PT services 2x/week for 8 weeks to address remaining goals.    Comorbidities  MS, blind, Osteoporosis, GERD, venous stasis, h/o eye surgery, h/o sepsis with hospitalization, UTI's, Left BBB, Left Ventiricular Hypertrophy    Stability/Clinical Decision Making  Evolving/Moderate complexity    Rehab Potential  Fair    PT Frequency  2x / week    PT Duration  8 weeks    PT Treatment/Interventions  ADLs/Self Care Home Management;Therapeutic activities;Gait training;Functional mobility training;Neuromuscular re-education;Balance training;Therapeutic exercise;Patient/family education;Wheelchair mobility training;Manual techniques;Passive range of motion;Energy conservation;Cryotherapy;Moist Heat    PT Next Visit Plan  transfers (sit to stand, w/c to mat), LE strengthening, UE strengthening    PT Home Exercise Plan  Access Code: [email or web address] (rows with red therdand, trunk flexion/forward reaching with activation of quads for standing)    Consulted and Agree with Plan of Care  Patient;Family member/caregiver    Family Member Consulted  [place] (CNA)       Patient will benefit from skilled therapeutic intervention in order to improve the following deficits and impairments:  Impaired flexibility, Decreased balance, Decreased activity tolerance, Decreased range of motion,  Decreased strength, Postural dysfunction, Decreased mobility, Pain, Improper body mechanics  Visit Diagnosis: Repeated falls  Muscle weakness (generalized)  Impaired flexibility of lower extremity     Problem List Patient Active Problem List   Diagnosis Date Noted  . Elevated troponin 07/13/2018  . LBBB (left bundle branch block) 07/13/2018  . LVH (left ventricular hypertrophy) 07/13/2018  . Coronary artery calcification seen on CAT scan 07/13/2018  . Bacteremia due to Enterococcus 07/02/2018  . E. coli UTI 07/02/2018  . Iron deficiency anemia 07/02/2018  . Hypomagnesemia 07/02/2018  . Hypocalcemia 07/02/2018  . Hypokalemia 07/02/2018  . Coronary artery disease 07/02/2018  . H/O eye surgery 07/02/2018  . Abdominal pain   . Acute lower UTI 06/25/2018  . Sepsis ([place]) 06/25/2018  . Acute cystitis with hematuria   . Venous stasis 11/20/2014  . Lung nodule < 6cm on CT 11/20/2014  . Left lower lobe pneumonia 11/17/2014  . Influenza A (H1N1) 11/06/2012  . Hematemesis 11/06/2012  . CAP (community acquired pneumonia) 11/05/2012  . Hypoxemia 11/05/2012  . SOB (shortness of breath) 11/05/2012  . Multiple sclerosis ([place]) 11/05/2012     Janene Harvey, PT, DPT 09/12/19 12:09 PM   [facility] [address]  [place], [place], [address] Phone: [phone]   Fax:  [phone]  Name: SUHAYLA CHISOM MRN: [id number] Date of Birth: October 03, 1945

## 2019-09-19 ENCOUNTER — Ambulatory Visit: Payer: Medicare Other | Attending: Internal Medicine | Admitting: Physical Therapy

## 2019-09-19 ENCOUNTER — Encounter: Payer: Self-pay | Admitting: Physical Therapy

## 2019-09-19 ENCOUNTER — Other Ambulatory Visit: Payer: Self-pay

## 2019-09-19 VITALS — BP 125/78 | HR 83

## 2019-09-19 DIAGNOSIS — M6281 Muscle weakness (generalized): Secondary | ICD-10-CM | POA: Diagnosis present

## 2019-09-19 DIAGNOSIS — R296 Repeated falls: Secondary | ICD-10-CM | POA: Diagnosis not present

## 2019-09-19 DIAGNOSIS — R29898 Other symptoms and signs involving the musculoskeletal system: Secondary | ICD-10-CM | POA: Diagnosis present

## 2019-09-19 NOTE — Therapy (Signed)
[facility] [address]  [place], [place], [address] Phone: [phone]   Fax:  [phone]  Physical Therapy Treatment  Patient Details  Name: Heather Lutz MRN: [id number] Date of Birth: [birth year] Referring Provider (PT): Adline Mango, MD   Encounter Date: 09/19/2019  PT End of Session - 09/19/19 1205    Visit Number  3    Number of Visits  18    Date for PT Re-Evaluation  11/07/19    Authorization Type  [employer] Medicare    PT Start Time  1117   pt late   PT Stop Time  1155    PT Time Calculation (min)  38 min    Equipment Utilized During Treatment  Gait belt    Activity Tolerance  Patient tolerated treatment well    Behavior During Therapy  [facility] for tasks assessed/performed       Past Medical History:  Diagnosis Date  . Blindness   . GERD (gastroesophageal reflux disease)   . Multiple sclerosis ([place])   . Osteoporosis     Past Surgical History:  Procedure Laterality Date  . ABDOMINAL HYSTERECTOMY    . CYSTOSCOPY WITH RETROGRADE PYELOGRAM, URETEROSCOPY AND STENT PLACEMENT Left 08/01/2018   Procedure: CYSTOSCOPY WITH RETROGRADE PYELOGRAM, URETEROSCOPY, HOLMIUM LASER AND STENT PLACEMENT;  Surgeon: Lucas Mallow, MD;  Location: [facility];  Service: Urology;  Laterality: Left;  . ESOPHAGOGASTRODUODENOSCOPY N/A 11/06/2012   Procedure: ESOPHAGOGASTRODUODENOSCOPY (EGD);  Surgeon: Lear Ng, MD;  Location: [facility] ENDOSCOPY;  Service: Endoscopy;  Laterality: N/A;    Vitals:   09/19/19 1124 09/19/19 1142  BP: 125/78   Pulse: 80 83  SpO2: 96% 98%    Subjective Assessment - 09/19/19 1117    Subjective  Been feeling good since last session. Asking what she can do for spasticity in her LEs.    Pertinent History  MS, blind, Osteoporosis, GERD, venous stasis, h/o eye surgery, h/o sepsis with hospitalization, UTI's, Left BBB, Left Ventiricular Hypertrophy    Patient Stated Goals  "I want to walk again"    Currently in Pain?  No/denies                       [facility] Adult PT Treatment/Exercise - 09/19/19 0001      Transfers   Transfers  Sit to Stand    Sit to Stand  3: Mod assist;2: Max assist    Number of Reps  --   2 reps   Transfer Cueing  cues for foot positioning, leaning chest forward, hand positioning and reaching for walker with one hand    Comments  1st rep mod A with RW; 2nd rep max A with RW      Self-Care   Self-Care  Other Self-Care Comments    Other Self-Care Comments   edu and demonstration on use of lumbar roll for improved sitting posture      Therapeutic Activites    Therapeutic Activities  Other Therapeutic Activities    Other Therapeutic Activities  practice scooting forward in W/C- manual and verbal cues for use of UEs and leaning to one side before moving 1 hip forward at a time   required CGA/min A throughout     Neuro Re-ed    Neuro Re-ed Details   anterior trunk lean in W/C to forward target x5 with cues to roll hips forward in order to encourage anterior pelvic tilt and upright chest  Knee/Hip Exercises: Seated   Long Arc Quad  Strengthening;Right;Left;1 set;5 reps    Long [employer] Limitations  --   difficulty bending knee back    Ball Squeeze  5x10"    Other Seated Knee/Hip Exercises  glute set 5x5"    Marching  Strengthening;Both;1 set;10 reps    Marching Limitations  feet on 6 inch step; mod-max A required to lift each LE off step             PT Education - 09/19/19 1158    Education Details  edu on use of lumbar roll for improved sitting posture in w/c    Person(s) Educated  Patient;Caregiver(s)   Jasmine   Methods  Explanation;Demonstration;Verbal cues;Tactile cues    Comprehension  Verbalized understanding       PT Short Term Goals - 09/19/19 1211      PT SHORT TERM GOAL #1   Title  Pt will be modified independent in her HEP    Time  4    Period  Weeks    Status  Achieved   reporting compliance until recent  exaccerbation of weakness   Target Date  10/10/19      PT SHORT TERM GOAL #2   Title  Pt will be able to tolerate 15 minutes of exercise without rest break.    Time  4    Period  Weeks    Status  Partially Met   reporting 10 min before requiring rest break   Target Date  10/10/19        PT Long Term Goals - 09/12/19 1111      PT LONG TERM GOAL #1   Title  Pt will be able to perform sit to stand with Contact Guard assistance with rolling walker.    Baseline  Mod-max assistance on 08/01/2019    Time  8    Period  Weeks    Status  On-going   requiring mod A x 2 without RW   Target Date  11/07/19      PT LONG TERM GOAL #2   Title  Pt will be able to perform sit to stand transfer with minimal assistance using LRAD.    Baseline  max assistance on 08/01/2019    Time  8    Period  Weeks    Status  On-going   requiring mod A x 2 without RW   Target Date  11/07/19      PT LONG TERM GOAL #3   Title  Pt will be able to perform supine to sit modified independently.    Baseline  requires varying assistance from caregivers    Time  8    Period  Weeks    Status  Deferred   NT   Target Date  11/07/19      PT LONG TERM GOAL #4   Title  Patient to demonstrate and recall importance of maintaining more upright sitting posture in W/C and report ability to maintain 10 min of upright sitting.    Time  8    Period  Weeks    Status  New    Target Date  11/07/19            Plan - 09/19/19 1205    Clinical Impression Statement  Patient without new complaints at beginning of session. Presenting with caregiver, Jasmine. Patient bringing in note of her goals for therapy. Patient participated in transfer training today with focus on scooting, anterior trunk lean,  and set up for STS transfer. Patient able to demonstrate scooting with CGA/min A and anterior trunk lean with CGA-mod A, with less assistance required towards the end of session. Demonstrated STS transfer x2 with use of RW and mod  A on first trial, max A on second trial, likely d/t fatigue. Reviewed previously administered HEP with patient demonstrating good carryover and effort. Ended session without complaints. Patient doing well with therapy. Plan to continue transfer training to increase independent and functional activity tolerance.    Comorbidities  MS, blind, Osteoporosis, GERD, venous stasis, h/o eye surgery, h/o sepsis with hospitalization, UTI's, Left BBB, Left Ventiricular Hypertrophy    Stability/Clinical Decision Making  Evolving/Moderate complexity    Rehab Potential  Fair    PT Frequency  2x / week    PT Duration  8 weeks    PT Treatment/Interventions  ADLs/Self Care Home Management;Therapeutic activities;Gait training;Functional mobility training;Neuromuscular re-education;Balance training;Therapeutic exercise;Patient/family education;Wheelchair mobility training;Manual techniques;Passive range of motion;Energy conservation;Cryotherapy;Moist Heat    PT Next Visit Plan  transfers (sit to stand, w/c to mat), LE strengthening, UE strengthening    PT Home Exercise Plan  Access Code: [email or web address] (rows with red therdand, trunk flexion/forward reaching with activation of quads for standing)    Consulted and Agree with Plan of Care  Patient;Family member/caregiver    Family Member Consulted  [place] (CNA)       Patient will benefit from skilled therapeutic intervention in order to improve the following deficits and impairments:  Impaired flexibility, Decreased balance, Decreased activity tolerance, Decreased range of motion, Decreased strength, Postural dysfunction, Decreased mobility, Pain, Improper body mechanics  Visit Diagnosis: Repeated falls  Muscle weakness (generalized)  Impaired flexibility of lower extremity     Problem List Patient Active Problem List   Diagnosis Date Noted  . Elevated troponin 07/13/2018  . LBBB (left bundle branch block) 07/13/2018  . LVH (left ventricular hypertrophy)  07/13/2018  . Coronary artery calcification seen on CAT scan 07/13/2018  . Bacteremia due to Enterococcus 07/02/2018  . E. coli UTI 07/02/2018  . Iron deficiency anemia 07/02/2018  . Hypomagnesemia 07/02/2018  . Hypocalcemia 07/02/2018  . Hypokalemia 07/02/2018  . Coronary artery disease 07/02/2018  . H/O eye surgery 07/02/2018  . Abdominal pain   . Acute lower UTI 06/25/2018  . Sepsis ([place]) 06/25/2018  . Acute cystitis with hematuria   . Venous stasis 11/20/2014  . Lung nodule < 6cm on CT 11/20/2014  . Left lower lobe pneumonia 11/17/2014  . Influenza A (H1N1) 11/06/2012  . Hematemesis 11/06/2012  . CAP (community acquired pneumonia) 11/05/2012  . Hypoxemia 11/05/2012  . SOB (shortness of breath) 11/05/2012  . Multiple sclerosis ([place]) 11/05/2012     Janene Harvey, PT, DPT 09/19/19 12:13 PM   [facility] [address]  [place], [place], [address] Phone: [phone]   Fax:  [phone]  Name: MAKIA BOSSI MRN: [id number] Date of Birth: 20-Feb-1946

## 2019-09-22 ENCOUNTER — Encounter: Payer: Medicare Other | Admitting: Physical Therapy

## 2019-09-25 ENCOUNTER — Ambulatory Visit: Payer: Medicare Other | Admitting: Physical Therapy

## 2019-09-25 ENCOUNTER — Other Ambulatory Visit: Payer: Self-pay

## 2019-09-25 ENCOUNTER — Encounter: Payer: Self-pay | Admitting: Physical Therapy

## 2019-09-25 VITALS — BP 144/72 | HR 82

## 2019-09-25 DIAGNOSIS — R296 Repeated falls: Secondary | ICD-10-CM | POA: Diagnosis not present

## 2019-09-25 DIAGNOSIS — R29898 Other symptoms and signs involving the musculoskeletal system: Secondary | ICD-10-CM

## 2019-09-25 DIAGNOSIS — M6281 Muscle weakness (generalized): Secondary | ICD-10-CM

## 2019-09-25 NOTE — Therapy (Signed)
[facility] [address]  [place], [place], [address] Phone: [phone]   Fax:  [phone]  Physical Therapy Treatment  Patient Details  Name: Heather Lutz MRN: [id number] Date of Birth: [birth year] Referring Provider (PT): Adline Mango, MD   Encounter Date: 09/25/2019  PT End of Session - 09/25/19 1201    Visit Number  4    Number of Visits  18    Date for PT Re-Evaluation  11/07/19    Authorization Type  [employer]HC Medicare    PT Start Time  1110    PT Stop Time  1151    PT Time Calculation (min)  41 min    Equipment Utilized During Treatment  Gait belt    Activity Tolerance  Patient tolerated treatment well;Patient limited by pain    Behavior During Therapy  [facility] for tasks assessed/performed       Past Medical History:  Diagnosis Date  . Blindness   . GERD (gastroesophageal reflux disease)   . Multiple sclerosis ([place])   . Osteoporosis     Past Surgical History:  Procedure Laterality Date  . ABDOMINAL HYSTERECTOMY    . CYSTOSCOPY WITH RETROGRADE PYELOGRAM, URETEROSCOPY AND STENT PLACEMENT Left 08/01/2018   Procedure: CYSTOSCOPY WITH RETROGRADE PYELOGRAM, URETEROSCOPY, HOLMIUM LASER AND STENT PLACEMENT;  Surgeon: Lucas Mallow, MD;  Location: [facility];  Service: Urology;  Laterality: Left;  . ESOPHAGOGASTRODUODENOSCOPY N/A 11/06/2012   Procedure: ESOPHAGOGASTRODUODENOSCOPY (EGD);  Surgeon: Lear Ng, MD;  [facility] END[facility];  Service: Endoscopy;  Laterality: N/A;    Vitals:   09/25/19 1112 09/25/19 1116  BP: (!) 162/80 (!) 144/72  Pulse: 82   SpO2: 97%     Subjective Assessment - 09/25/19 1108    Subjective  Present with Caregiver and husband. Arms are a little sore today.    Pertinent History  MS, blind, Osteoporosis, GERD, venous stasis, h/o eye surgery, h/o sepsis with hospitalization, UTI's, Left BBB, Left Ventiricular Hypertrophy    Patient Stated Goals  "I want to walk again"     Currently in Pain?  No/denies                       [facility] Adult PT Treatment/Exercise - 09/25/19 0001      Bed Mobility   Bed Mobility  Rolling Right;Rolling Left;Right Sidelying to Sit;Sit to Sidelying Right;Sitting - Scoot to Edge of Bed    Rolling Right  Moderate Assistance - Patient 50-74%   cues to reach across body with UE, assist bringing L LE over   Rolling Left  Moderate Assistance - Patient 50-74%   cues to reach across body with UE, assist bringing R LE over   Right Sidelying to Sit  Moderate Assistance - Patient 50-74%;2 Helpers   assist with LEs, VC's for hand placement, and assist at UEs   Sitting - Scoot to [place] of Bed  Minimal Assistance - Patient > 75%   cues to shift to 1 side, then scoot   Sit to Sidelying Right  2 Helpers;Minimal Assistance - Patient > 75%   assist at LEs and upper body     Neuro Re-ed    Neuro Re-ed Details   sitting balance at edge of mat with feet on 4" step with min- mod A - cues to encourage anterior pelvic tilt, upright chest, anterior trunk lean; practice leaning on R/L forearm and pushing back up wiht CGA/min A 4x each side  Knee/Hip Exercises: Aerobic   Nustep  L1 x6 min (UE/LEs)   caregiver transferred patient; vitals at 3 min 98%O2, 80bpm              PT Short Term Goals - 09/19/19 1211      PT SHORT TERM GOAL #1   Title  Pt will be modified independent in her HEP    Time  4    Period  Weeks    Status  Achieved   reporting compliance until recent exaccerbation of weakness   Target Date  10/10/19      PT SHORT TERM GOAL #2   Title  Pt will be able to tolerate 15 minutes of exercise without rest break.    Time  4    Period  Weeks    Status  Partially Met   reporting 10 min before requiring rest break   Target Date  10/10/19        PT Long Term Goals - 09/25/19 1209      PT LONG TERM GOAL #1   Title  Pt will be able to perform sit to stand with Contact Guard assistance with rolling walker.     Baseline  Mod-max assistance on 08/01/2019    Time  8    Period  Weeks    Status  On-going   requiring mod A x 2 without RW     PT LONG TERM GOAL #2   Title  Pt will be able to perform sit to stand transfer with minimal assistance using LRAD.    Baseline  max assistance on 08/01/2019    Time  8    Period  Weeks    Status  On-going   requiring mod A x 2 without RW     PT LONG TERM GOAL #3   Title  Pt will be able to perform supine to sit modified independently.    Baseline  requires varying assistance from caregivers    Time  8    Period  Weeks    Status  Deferred   NT     PT LONG TERM GOAL #4   Title  Patient to demonstrate and recall importance of maintaining more upright sitting posture in W/C and report ability to maintain 10 min of upright sitting.    Time  8    Period  Weeks    Status  On-going            Plan - 09/25/19 1202    Clinical Impression Statement  Patient presenting with caregiver and husband today. Patient previously requesting to use Nustep, thus used this machine for warm-up today while monitoring vitals. All transfers to/from w/c were performed by caregiver today as patient continues to require max A. Patient did demonstrate improvement in ability to push up from armrests and scoot self forward for proper transfer set up. Worked on challenging sitting balance with cues to encourage anterior trunk lean, anterior pelvic tilt, and upright chest as patient with poor core control and resultant sacral-sitting posture. Was able to demonstrate good lateral push off from forearms in sitting. Supine tolerance was limited d/t patient's c/o L hip pain, thus, more time was spent on practice rolling and supine<>sit transfers. Patient without complaints at end of session. Plan to continue addressing transfers and core strengthening in future sessions.    Comorbidities  MS, blind, Osteoporosis, GERD, venous stasis, h/o eye surgery, h/o sepsis with hospitalization, UTI's,  Left BBB, Left Ventiricular Hypertrophy  Stability/Clinical Decision Making  Evolving/Moderate complexity    Rehab Potential  Fair    PT Frequency  2x / week    PT Duration  8 weeks    PT Treatment/Interventions  ADLs/Self Care Home Management;Therapeutic activities;Gait training;Functional mobility training;Neuromuscular re-education;Balance training;Therapeutic exercise;Patient/family education;Wheelchair mobility training;Manual techniques;Passive range of motion;Energy conservation;Cryotherapy;Moist Heat    PT Next Visit Plan  transfers (sit to stand, w/c to mat), LE strengthening, UE strengthening    PT Home Exercise Plan  Access Code: [email or web address] (rows with red therdand, trunk flexion/forward reaching with activation of quads for standing)    Consulted and Agree with Plan of Care  Patient;Family member/caregiver    Family Member Consulted  [place] (CNA)       Patient will benefit from skilled therapeutic intervention in order to improve the following deficits and impairments:  Impaired flexibility, Decreased balance, Decreased activity tolerance, Decreased range of motion, Decreased strength, Postural dysfunction, Decreased mobility, Pain, Improper body mechanics  Visit Diagnosis: Repeated falls  Muscle weakness (generalized)  Impaired flexibility of lower extremity     Problem List Patient Active Problem List   Diagnosis Date Noted  . Elevated troponin 07/13/2018  . LBBB (left bundle branch block) 07/13/2018  . LVH (left ventricular hypertrophy) 07/13/2018  . Coronary artery calcification seen on CAT scan 07/13/2018  . Bacteremia due to Enterococcus 07/02/2018  . E. coli UTI 07/02/2018  . Iron deficiency anemia 07/02/2018  . Hypomagnesemia 07/02/2018  . Hypocalcemia 07/02/2018  . Hypokalemia 07/02/2018  . Coronary artery disease 07/02/2018  . H/O eye surgery 07/02/2018  . Abdominal pain   . Acute lower UTI 06/25/2018  . Sepsis ([place]) 06/25/2018  . Acute cystitis with  hematuria   . Venous stasis 11/20/2014  . Lung nodule < 6cm on CT 11/20/2014  . Left lower lobe pneumonia 11/17/2014  . Influenza A (H1N1) 11/06/2012  . Hematemesis 11/06/2012  . CAP (community acquired pneumonia) 11/05/2012  . Hypoxemia 11/05/2012  . SOB (shortness of breath) 11/05/2012  . Multiple sclerosis ([place]) 11/05/2012     Janene Harvey, PT, DPT 09/25/19 12:16 PM   [facility] [address]  [place], [place], [address] Phone: [phone]   Fax:  [phone]  Name: Heather Lutz MRN: [id number] Date of Birth: [birth year]

## 2019-09-28 ENCOUNTER — Other Ambulatory Visit: Payer: Self-pay

## 2019-09-28 ENCOUNTER — Ambulatory Visit: Payer: Medicare Other | Admitting: Physical Therapy

## 2019-09-28 ENCOUNTER — Encounter: Payer: Self-pay | Admitting: Physical Therapy

## 2019-09-28 VITALS — BP 130/83 | HR 75

## 2019-09-28 DIAGNOSIS — M6281 Muscle weakness (generalized): Secondary | ICD-10-CM

## 2019-09-28 DIAGNOSIS — R296 Repeated falls: Secondary | ICD-10-CM

## 2019-09-28 DIAGNOSIS — R29898 Other symptoms and signs involving the musculoskeletal system: Secondary | ICD-10-CM

## 2019-09-28 NOTE — Therapy (Signed)
[facility] [address]  [place], [place], [address] Phone: [phone]   Fax:  [phone]  Physical Therapy Treatment  Patient Details  Name: Heather Lutz MRN: [id number] Date of Birth: 11/08/1945 Referring Provider (PT): Adline Mango, MD   Encounter Date: 09/28/2019  PT End of Session - 09/28/19 1201    Visit Number  5    Number of Visits  18    Date for PT Re-Evaluation  11/07/19    Authorization Type  [employer] Medicare    PT Start Time  1117   pt late   PT Stop Time  1152    PT Time Calculation (min)  35 min    Equipment Utilized During Treatment  Gait belt    Activity Tolerance  Patient tolerated treatment well    Behavior During Therapy  [facility] for tasks assessed/performed       Past Medical History:  Diagnosis Date  . Blindness   . GERD (gastroesophageal reflux disease)   . Multiple sclerosis ([place])   . Osteoporosis     Past Surgical History:  Procedure Laterality Date  . ABDOMINAL HYSTERECTOMY    . CYSTOSCOPY WITH RETROGRADE PYELOGRAM, URETEROSCOPY AND STENT PLACEMENT Left 08/01/2018   Procedure: CYSTOSCOPY WITH RETROGRADE PYELOGRAM, URETEROSCOPY, HOLMIUM LASER AND STENT PLACEMENT;  Surgeon: Lucas Mallow, MD;  Location: [facility];  Service: Urology;  Laterality: Left;  . ESOPHAGOGASTRODUODENOSCOPY N/A 11/06/2012   Procedure: ESOPHAGOGASTRODUODENOSCOPY (EGD);  Surgeon: Lear Ng, MD;  Location: [facility] ENDOSCOPY;  Service: Endoscopy;  Laterality: N/A;    Vitals:   09/28/19 1118  BP: 130/83  Pulse: 75  SpO2: 98%    Subjective Assessment - 09/28/19 1124    Subjective  Present with caregiver. Everything is good.    Pertinent History  MS, blind, Osteoporosis, GERD, venous stasis, h/o eye surgery, h/o sepsis with hospitalization, UTI's, Left BBB, Left Ventiricular Hypertrophy    Patient Stated Goals  "I want to walk again"    Currently in Pain?  No/denies                        [facility] Adult PT Treatment/Exercise - 09/28/19 0001      Transfers   Transfers  Squat Pivot Transfers    Squat Pivot Transfers  3: Mod assist   mod A x2 w/c<>nustep   Squat Pivot Transfer Details (indicate cue type and reason)  good initiation of anterior trunk lean and anterior scooting    Number of Reps  --   2     Knee/Hip Exercises: Stretches   Passive Hamstring Stretch  Right;Left;1 rep;30 seconds    Passive Hamstring Stretch Limitations  sitting in w/c with lumbar roll and heel on stool   assistance provided to maintain anterior trunk lean   Gastroc Stretch  Right;Left;1 rep;30 seconds    Gastroc Stretch Limitations  sitting in w/c with strap and heel on stool      Knee/Hip Exercises: Aerobic   Nustep  L2 x6 min (UE/LEs)             PT Education - 09/28/19 1200    Education Details  edu on HEP update and proper set up for exercises; edu on aerobic equipment that may be beneficial for home use    Person(s) Educated  Patient;Caregiver(s)    Methods  Explanation;Demonstration;Tactile cues;Verbal cues;Handout    Comprehension  Verbalized understanding;Returned demonstration       PT  Short Term Goals - 09/19/19 1211      PT SHORT TERM GOAL #1   Title  Pt will be modified independent in her HEP    Time  4    Period  Weeks    Status  Achieved   reporting compliance until recent exaccerbation of weakness   Target Date  10/10/19      PT SHORT TERM GOAL #2   Title  Pt will be able to tolerate 15 minutes of exercise without rest break.    Time  4    Period  Weeks    Status  Partially Met   reporting 10 min before requiring rest break   Target Date  10/10/19        PT Long Term Goals - 09/25/19 1209      PT LONG TERM GOAL #1   Title  Pt will be able to perform sit to stand with Contact Guard assistance with rolling walker.    Baseline  Mod-max assistance on 08/01/2019    Time  8    Period  Weeks    Status  On-going    requiring mod A x 2 without RW     PT LONG TERM GOAL #2   Title  Pt will be able to perform sit to stand transfer with minimal assistance using LRAD.    Baseline  max assistance on 08/01/2019    Time  8    Period  Weeks    Status  On-going   requiring mod A x 2 without RW     PT LONG TERM GOAL #3   Title  Pt will be able to perform supine to sit modified independently.    Baseline  requires varying assistance from caregivers    Time  8    Period  Weeks    Status  Deferred   NT     PT LONG TERM GOAL #4   Title  Patient to demonstrate and recall importance of maintaining more upright sitting posture in W/C and report ability to maintain 10 min of upright sitting.    Time  8    Period  Weeks    Status  On-going            Plan - 09/28/19 1201    Clinical Impression Statement  Patient present with caregiver. Patient demonstrating good initiation of transfer set up into Nustep today, with ability to scoot forward in w/c with mod I. Noting limited consistency with HEP d/t not having someone present at home with her at all times. Patient also reporting some recent muscle spasms in B posterior LEs. Thus, worked on [employer] and HS stretching in w/c for pain relief. Patient required assistance with proper set up with stretches as well as assistance with maintaining anterior trunk lean with HS stretch. Tolerated these exercises well. Updated and reviewed HEP with patient and caregiver, Jasmine. Both reported understanding. Patient without complaints at end of session.    Comorbidities  MS, blind, Osteoporosis, GERD, venous stasis, h/o eye surgery, h/o sepsis with hospitalization, UTI's, Left BBB, Left Ventiricular Hypertrophy    Stability/Clinical Decision Making  Evolving/Moderate complexity    Rehab Potential  Fair    PT Frequency  2x / week    PT Duration  8 weeks    PT Treatment/Interventions  ADLs/Self Care Home Management;Therapeutic activities;Gait training;Functional mobility  training;Neuromuscular re-education;Balance training;Therapeutic exercise;Patient/family education;Wheelchair mobility training;Manual techniques;Passive range of motion;Energy conservation;Cryotherapy;Moist Heat    PT Next Visit Plan  transfers (sit to stand, w/c to mat), LE strengthening, UE strengthening    PT Home Exercise Plan  Access Code: [email or web address] (rows with red therdand, trunk flexion/forward reaching with activation of quads for standing)    Consulted and Agree with Plan of Care  Patient;Family member/caregiver    Family Member Consulted  [place] (CNA)       Patient will benefit from skilled therapeutic intervention in order to improve the following deficits and impairments:  Impaired flexibility, Decreased balance, Decreased activity tolerance, Decreased range of motion, Decreased strength, Postural dysfunction, Decreased mobility, Pain, Improper body mechanics  Visit Diagnosis: Repeated falls  Muscle weakness (generalized)  Impaired flexibility of lower extremity     Problem List Patient Active Problem List   Diagnosis Date Noted  . Elevated troponin 07/13/2018  . LBBB (left bundle branch block) 07/13/2018  . LVH (left ventricular hypertrophy) 07/13/2018  . Coronary artery calcification seen on CAT scan 07/13/2018  . Bacteremia due to Enterococcus 07/02/2018  . E. coli UTI 07/02/2018  . Iron deficiency anemia 07/02/2018  . Hypomagnesemia 07/02/2018  . Hypocalcemia 07/02/2018  . Hypokalemia 07/02/2018  . Coronary artery disease 07/02/2018  . H/O eye surgery 07/02/2018  . Abdominal pain   . Acute lower UTI 06/25/2018  . Sepsis ([place]) 06/25/2018  . Acute cystitis with hematuria   . Venous stasis 11/20/2014  . Lung nodule < 6cm on CT 11/20/2014  . Left lower lobe pneumonia 11/17/2014  . Influenza A (H1N1) 11/06/2012  . Hematemesis 11/06/2012  . CAP (community acquired pneumonia) 11/05/2012  . Hypoxemia 11/05/2012  . SOB (shortness of breath) 11/05/2012  . Multiple  sclerosis ([place]) 11/05/2012    Janene Harvey, PT, DPT 09/28/19 12:10 PM   [facility] [address]  [place], [place], [address] Phone: [phone]   Fax:  [phone]  Name: GERTURDE KUBA MRN: [id number] Date of Birth: 1946/05/23

## 2019-10-03 ENCOUNTER — Other Ambulatory Visit: Payer: Self-pay

## 2019-10-03 ENCOUNTER — Ambulatory Visit: Payer: Medicare Other | Admitting: Physical Therapy

## 2019-10-03 ENCOUNTER — Encounter: Payer: Self-pay | Admitting: Physical Therapy

## 2019-10-03 VITALS — BP 144/78 | HR 90

## 2019-10-03 DIAGNOSIS — R29898 Other symptoms and signs involving the musculoskeletal system: Secondary | ICD-10-CM

## 2019-10-03 DIAGNOSIS — M6281 Muscle weakness (generalized): Secondary | ICD-10-CM

## 2019-10-03 DIAGNOSIS — R296 Repeated falls: Secondary | ICD-10-CM

## 2019-10-03 NOTE — Therapy (Signed)
[facility] [address]  [place], [place], [address] Phone: [phone]   Fax:  [phone]  Physical Therapy Treatment  Patient Details  Name: Heather Lutz MRN: [id number] Date of Birth: 1945-11-26 Referring Provider (PT): Adline Mango, MD   Encounter Date: 10/03/2019  PT End of Session - 10/03/19 1639    Visit Number  6    Number of Visits  18    Date for PT Re-Evaluation  11/07/19    Authorization Type  UHC Medicare    PT Start Time  1322    PT Stop Time  1404    PT Time Calculation (min)  42 min    Equipment Utilized During Treatment  Gait belt    Activity Tolerance  Patient tolerated treatment well    Behavior During Therapy  [facility] for tasks assessed/performed       Past Medical History:  Diagnosis Date  . Blindness   . GERD (gastroesophageal reflux disease)   . Multiple sclerosis ([place])   . Osteoporosis     Past Surgical History:  Procedure Laterality Date  . ABDOMINAL HYSTERECTOMY    . CYSTOSCOPY WITH RETROGRADE PYELOGRAM, URETEROSCOPY AND STENT PLACEMENT Left 08/01/2018   Procedure: CYSTOSCOPY WITH RETROGRADE PYELOGRAM, URETEROSCOPY, HOLMIUM LASER AND STENT PLACEMENT;  Surgeon: Lucas Mallow, MD;  Location: [facility];  Service: Urology;  Laterality: Left;  . ESOPHAGOGASTRODUODENOSCOPY N/A 11/06/2012   Procedure: ESOPHAGOGASTRODUODENOSCOPY (EGD);  Surgeon: Lear Ng, MD;  Location: [facility] ENDOSCOPY;  Service: Endoscopy;  Laterality: N/A;    Vitals:   10/03/19 1324 10/03/19 1336  BP: (!) 144/78   Pulse: 67 90  SpO2: 93% 96%    Subjective Assessment - 10/03/19 1323    Subjective  Doing well. Had a good weekend.    Pertinent History  MS, blind, Osteoporosis, GERD, venous stasis, h/o eye surgery, h/o sepsis with hospitalization, UTI's, Left BBB, Left Ventiricular Hypertrophy    Patient Stated Goals  "I want to walk again"    Currently in Pain?  No/denies                        [facility] Adult PT Treatment/Exercise - 10/03/19 0001      Transfers   Transfers  Squat Pivot Transfers    Sit to Stand  2: Max assist   max A x 2 with RW   Sit to Stand Details  Tactile cues for sequencing;Tactile cues for weight shifting;Tactile cues for posture;Tactile cues for placement    Sit to Stand Details (indicate cue type and reason)  required cues for hand placement, scooting forward, and glute activation upon standing, patient demonstrating decreased quad activation resulting in B knee buckling, thus patient was seated back into w/c    Squat Pivot Transfers  3: Mod assist   mod A x2; to/from w/c and nustep seat     Neuro Re-ed    Neuro Re-ed Details   sitting rolling orange pbysioball forward with min A 2x5 reps      Knee/Hip Exercises: Aerobic   Nustep  L2 x6 min (UE/LEs)      Shoulder Exercises: Seated   Extension  Strengthening;Both;10 reps;Theraband    Theraband Level (Shoulder Extension)  Level 1 (Yellow)    Extension Limitations  cues to maintain elbows by side             PT Education - 10/03/19 1638    Education Details  update  to HEP    Person(s) Educated  Patient;Caregiver(s)   Jasmine   Methods  Explanation;Demonstration;Tactile cues;Verbal cues;Handout    Comprehension  Verbalized understanding;Returned demonstration       PT Short Term Goals - 09/19/19 1211      PT SHORT TERM GOAL #1   Title  Pt will be modified independent in her HEP    Time  4    Period  Weeks    Status  Achieved   reporting compliance until recent exaccerbation of weakness   Target Date  10/10/19      PT SHORT TERM GOAL #2   Title  Pt will be able to tolerate 15 minutes of exercise without rest break.    Time  4    Period  Weeks    Status  Partially Met   reporting 10 min before requiring rest break   Target Date  10/10/19        PT Long Term Goals - 09/25/19 1209      PT LONG TERM GOAL #1   Title  Pt will be able to perform  sit to stand with Contact Guard assistance with rolling walker.    Baseline  Mod-max assistance on 08/01/2019    Time  8    Period  Weeks    Status  On-going   requiring mod A x 2 without RW     PT LONG TERM GOAL #2   Title  Pt will be able to perform sit to stand transfer with minimal assistance using LRAD.    Baseline  max assistance on 08/01/2019    Time  8    Period  Weeks    Status  On-going   requiring mod A x 2 without RW     PT LONG TERM GOAL #3   Title  Pt will be able to perform supine to sit modified independently.    Baseline  requires varying assistance from caregivers    Time  8    Period  Weeks    Status  Deferred   NT     PT LONG TERM GOAL #4   Title  Patient to demonstrate and recall importance of maintaining more upright sitting posture in W/C and report ability to maintain 10 min of upright sitting.    Time  8    Period  Weeks    Status  On-going            Plan - 10/03/19 1639    Clinical Impression Statement  Patient present with caregiver. Reporting no new complaints. Able to continue demonstrating good effort with scooting forward in seat in preparation for transfers today, but with slightly increased difficulty compared to previous sessions. Worked on STS transfers with RW and 2 person max A. Patient required cues for hand placement and scooting forward partially d/t patient's hx blindness. Did require cues for glute activation upon standing to enable her to stand more upright. Upon standing, patient demonstrated difficulty activating quads resulting in B knee buckling, thus patient was seated back into w/c. Attempted STS 2x, however patient still had difficulty today, thus worked on seated exercise. Patient able to demonstrate continued effort to perform anterior trunk lean or modified sit up in w/c with minor assistance. Worked on UE strengthening with banded resistance with good understanding. Updated HEP with new exercise- patient and caregiver both  reported understanding. No complaints at end of session. Patient progressing towards goals.    Comorbidities  MS, blind, Osteoporosis, GERD,  venous stasis, h/o eye surgery, h/o sepsis with hospitalization, UTI's, Left BBB, Left Ventiricular Hypertrophy    Stability/Clinical Decision Making  Evolving/Moderate complexity    Rehab Potential  Fair    PT Frequency  2x / week    PT Duration  8 weeks    PT Treatment/Interventions  ADLs/Self Care Home Management;Therapeutic activities;Gait training;Functional mobility training;Neuromuscular re-education;Balance training;Therapeutic exercise;Patient/family education;Wheelchair mobility training;Manual techniques;Passive range of motion;Energy conservation;Cryotherapy;Moist Heat    PT Next Visit Plan  transfers (sit to stand, w/c to mat), LE strengthening, UE strengthening    PT Home Exercise Plan  Access Code: [email or web address] (rows with red therdand, trunk flexion/forward reaching with activation of quads for standing)    Consulted and Agree with Plan of Care  Patient;Family member/caregiver    Family Member Consulted  [place] (CNA)       Patient will benefit from skilled therapeutic intervention in order to improve the following deficits and impairments:  Impaired flexibility, Decreased balance, Decreased activity tolerance, Decreased range of motion, Decreased strength, Postural dysfunction, Decreased mobility, Pain, Improper body mechanics  Visit Diagnosis: Repeated falls  Muscle weakness (generalized)  Impaired flexibility of lower extremity     Problem List Patient Active Problem List   Diagnosis Date Noted  . Elevated troponin 07/13/2018  . LBBB (left bundle branch block) 07/13/2018  . LVH (left ventricular hypertrophy) 07/13/2018  . Coronary artery calcification seen on CAT scan 07/13/2018  . Bacteremia due to Enterococcus 07/02/2018  . E. coli UTI 07/02/2018  . Iron deficiency anemia 07/02/2018  . Hypomagnesemia 07/02/2018  .  Hypocalcemia 07/02/2018  . Hypokalemia 07/02/2018  . Coronary artery disease 07/02/2018  . H/O eye surgery 07/02/2018  . Abdominal pain   . Acute lower UTI 06/25/2018  . Sepsis ([place]) 06/25/2018  . Acute cystitis with hematuria   . Venous stasis 11/20/2014  . Lung nodule < 6cm on CT 11/20/2014  . Left lower lobe pneumonia 11/17/2014  . Influenza A (H1N1) 11/06/2012  . Hematemesis 11/06/2012  . CAP (community acquired pneumonia) 11/05/2012  . Hypoxemia 11/05/2012  . SOB (shortness of breath) 11/05/2012  . Multiple sclerosis ([place]) 11/05/2012    Janene Harvey, PT, DPT 10/03/19 4:45 PM   [facility] [address]  [place], [place], [address] Phone: [phone]   Fax:  [phone]  Name: KELIAH HARNED MRN: [id number] Date of Birth: [birth year]

## 2019-10-09 ENCOUNTER — Ambulatory Visit: Payer: Medicare Other | Admitting: Physical Therapy

## 2019-10-09 ENCOUNTER — Other Ambulatory Visit: Payer: Self-pay

## 2019-10-09 ENCOUNTER — Encounter: Payer: Self-pay | Admitting: Physical Therapy

## 2019-10-09 VITALS — BP 123/68 | HR 70

## 2019-10-09 DIAGNOSIS — M6281 Muscle weakness (generalized): Secondary | ICD-10-CM

## 2019-10-09 DIAGNOSIS — R29898 Other symptoms and signs involving the musculoskeletal system: Secondary | ICD-10-CM

## 2019-10-09 DIAGNOSIS — R296 Repeated falls: Secondary | ICD-10-CM

## 2019-10-09 NOTE — Therapy (Signed)
[facility] [address]  [place], [place], [address] Phone: [phone]   Fax:  [phone]  Physical Therapy Treatment  Patient Details  Name: Heather Lutz MRN: [id number] Date of Birth: [birth year] Referring Provider (PT): Adline Mango, MD   Encounter Date: 10/09/2019  PT End of Session - 10/09/19 1545    Visit Number  7    Number of Visits  18    Date for PT Re-Evaluation  11/07/19    Authorization Type  UHC Medicare    PT Start Time  1315    PT Stop Time  1400    PT Time Calculation (min)  45 min    Activity Tolerance  Patient tolerated treatment well    Behavior During Therapy  [facility] for tasks assessed/performed       Past Medical History:  Diagnosis Date  . Blindness   . GERD (gastroesophageal reflux disease)   . Multiple sclerosis ([place])   . Osteoporosis     Past Surgical History:  Procedure Laterality Date  . ABDOMINAL HYSTERECTOMY    . CYSTOSCOPY WITH RETROGRADE PYELOGRAM, URETEROSCOPY AND STENT PLACEMENT Left 08/01/2018   Procedure: CYSTOSCOPY WITH RETROGRADE PYELOGRAM, URETEROSCOPY, HOLMIUM LASER AND STENT PLACEMENT;  Surgeon: Lucas Mallow, MD;  Location: [facility];  Service: Urology;  Laterality: Left;  . ESOPHAGOGASTRODUODENOSCOPY N/A 11/06/2012   Procedure: ESOPHAGOGASTRODUODENOSCOPY (EGD);  Surgeon: Lear Ng, MD;  Location: [facility];  Service: Endoscopy;  Laterality: N/A;    Vitals:   10/09/19 1316 10/09/19 1329  BP: 123/68   Pulse: 82 70  SpO2: 100% (!) 89%    Subjective Assessment - 10/09/19 1305    Subjective  Present with caregiver. Doing well. Enjoyed the sunshine over the weekend. Having some R sided anterior thigh burning.    Pertinent History  MS, blind, Osteoporosis, GERD, venous stasis, h/o eye surgery, h/o sepsis with hospitalization, UTI's, Left BBB, Left Ventiricular Hypertrophy    Patient Stated Goals  "I want to walk again"    Currently in Pain?  Yes     Pain Score  4     Pain Orientation  Right;Anterior    Pain Descriptors / Indicators  Burning    Pain Type  Chronic pain                       [facility] Adult PT Treatment/Exercise - 10/09/19 0001      Exercises   Exercises  Elbow      Elbow Exercises   Elbow Flexion  Strengthening;Both;10 reps;Seated;Theraband    Theraband Level (Elbow Flexion)  Level 1 (Yellow)    Elbow Flexion Limitations  sitting in w/c     Elbow Extension  Strengthening;Both;10 reps;Seated;Theraband    Theraband Level (Elbow Extension)  Level 1 (Yellow)    Elbow Extension Limitations  sitting in w/c       Knee/Hip Exercises: Stretches   Gastroc Stretch  Right;Left;1 rep;30 seconds    Gastroc Stretch Limitations  sitting in w/c with strap and heel on stool      Knee/Hip Exercises: Aerobic   Nustep  L2 x6 min (UE/LEs)      Manual Therapy   Manual Therapy  Passive ROM    Passive ROM  R/L figure 4 stretch 2x30" to tolerance             PT Education - 10/09/19 1545    Education Details  update to HEP  Person(s) Educated  Patient;Caregiver(s)   Jasmine   Methods  Explanation;Demonstration;Tactile cues;Verbal cues;Handout    Comprehension  Verbalized understanding;Returned demonstration       PT Short Term Goals - 09/19/19 1211      PT SHORT TERM GOAL #1   Title  Pt will be modified independent in her HEP    Time  4    Period  Weeks    Status  Achieved   reporting compliance until recent exaccerbation of weakness   Target Date  10/10/19      PT SHORT TERM GOAL #2   Title  Pt will be able to tolerate 15 minutes of exercise without rest break.    Time  4    Period  Weeks    Status  Partially Met   reporting 10 min before requiring rest break   Target Date  10/10/19        PT Long Term Goals - 09/25/19 1209      PT LONG TERM GOAL #1   Title  Pt will be able to perform sit to stand with Contact Guard assistance with rolling walker.    Baseline  Mod-max assistance on  08/01/2019    Time  8    Period  Weeks    Status  On-going   requiring mod A x 2 without RW     PT LONG TERM GOAL #2   Title  Pt will be able to perform sit to stand transfer with minimal assistance using LRAD.    Baseline  max assistance on 08/01/2019    Time  8    Period  Weeks    Status  On-going   requiring mod A x 2 without RW     PT LONG TERM GOAL #3   Title  Pt will be able to perform supine to sit modified independently.    Baseline  requires varying assistance from caregivers    Time  8    Period  Weeks    Status  Deferred   NT     PT LONG TERM GOAL #4   Title  Patient to demonstrate and recall importance of maintaining more upright sitting posture in W/C and report ability to maintain 10 min of upright sitting.    Time  8    Period  Weeks    Status  On-going            Plan - 10/09/19 1545    Clinical Impression Statement  Patient reporting mild R anterior thigh pain today, which she notes is a chronic problem. Caregiver performed transfers to/from w/c today. Patient requesting to try stretches for her hips in order to improve ease with toilet hygiene. Patient tolerated passive figure 4 stretching with mild discomfort in R hip, but tolerable. Updated HEP with this stretch as patient reporting benefit- educated both patient and caregiver on this exercise for understanding. Remainder of session focused on UE strengthening. Patient required intermittent cues for proper form. Set up also required for all exercises d/t patient's hx of blindness. Patient noting improvement in tolerance for upright sitting in her w/c since starting therapy. Plan to progress positional tolerance as well as UE/LE strengthening in future sessions. No complaints at end of session.    Comorbidities  MS, blind, Osteoporosis, GERD, venous stasis, h/o eye surgery, h/o sepsis with hospitalization, UTI's, Left BBB, Left Ventiricular Hypertrophy    Stability/Clinical Decision Making  Evolving/Moderate  complexity    Rehab Potential  Fair  PT Frequency  2x / week    PT Duration  8 weeks    PT Treatment/Interventions  ADLs/Self Care Home Management;Therapeutic activities;Gait training;Functional mobility training;Neuromuscular re-education;Balance training;Therapeutic exercise;Patient/family education;Wheelchair mobility training;Manual techniques;Passive range of motion;Energy conservation;Cryotherapy;Moist Heat    PT Next Visit Plan  transfers (sit to stand, w/c to mat), LE strengthening, UE strengthening    PT Home Exercise Plan  Access Code: [email or web address] (rows with red therdand, trunk flexion/forward reaching with activation of quads for standing)    Consulted and Agree with Plan of Care  Patient;Family member/caregiver    Family Member Consulted  [place] (CNA)       Patient will benefit from skilled therapeutic intervention in order to improve the following deficits and impairments:  Impaired flexibility, Decreased balance, Decreased activity tolerance, Decreased range of motion, Decreased strength, Postural dysfunction, Decreased mobility, Pain, Improper body mechanics  Visit Diagnosis: Repeated falls  Muscle weakness (generalized)  Impaired flexibility of lower extremity     Problem List Patient Active Problem List   Diagnosis Date Noted  . Elevated troponin 07/13/2018  . LBBB (left bundle branch block) 07/13/2018  . LVH (left ventricular hypertrophy) 07/13/2018  . Coronary artery calcification seen on CAT scan 07/13/2018  . Bacteremia due to Enterococcus 07/02/2018  . E. coli UTI 07/02/2018  . Iron deficiency anemia 07/02/2018  . Hypomagnesemia 07/02/2018  . Hypocalcemia 07/02/2018  . Hypokalemia 07/02/2018  . Coronary artery disease 07/02/2018  . H/O eye surgery 07/02/2018  . Abdominal pain   . Acute lower UTI 06/25/2018  . Sepsis ([place]) 06/25/2018  . Acute cystitis with hematuria   . Venous stasis 11/20/2014  . Lung nodule < 6cm on CT 11/20/2014  . Left lower lobe  pneumonia 11/17/2014  . Influenza A (H1N1) 11/06/2012  . Hematemesis 11/06/2012  . CAP (community acquired pneumonia) 11/05/2012  . Hypoxemia 11/05/2012  . SOB (shortness of breath) 11/05/2012  . Multiple sclerosis ([place]) 11/05/2012     Janene Harvey, PT, DPT 10/09/19 3:50 PM   [facility] [address]  [place], [place], [address] Phone: [phone]   Fax:  [phone]  Name: Heather Lutz MRN: [id number] Date of Birth: [birth year]

## 2019-10-19 ENCOUNTER — Ambulatory Visit: Payer: Medicare Other | Attending: Internal Medicine | Admitting: Physical Therapy

## 2019-10-19 ENCOUNTER — Encounter: Payer: Self-pay | Admitting: Physical Therapy

## 2019-10-19 ENCOUNTER — Other Ambulatory Visit: Payer: Self-pay

## 2019-10-19 VITALS — BP 132/62 | HR 80

## 2019-10-19 DIAGNOSIS — R29898 Other symptoms and signs involving the musculoskeletal system: Secondary | ICD-10-CM | POA: Diagnosis present

## 2019-10-19 DIAGNOSIS — M6281 Muscle weakness (generalized): Secondary | ICD-10-CM

## 2019-10-19 DIAGNOSIS — R296 Repeated falls: Secondary | ICD-10-CM | POA: Diagnosis present

## 2019-10-19 NOTE — Therapy (Signed)
[facility] [address]  [place], [place], [address] Phone: [phone]   Fax:  [phone]  Physical Therapy Treatment  Patient Details  Name: Heather Lutz MRN: [id number] Date of Birth: [birth year] Referring Provider (PT): Adline Mango, MD   Encounter Date: 10/19/2019  PT End of Session - 10/19/19 1206    Visit Number  8    Number of Visits  18    Date for PT Re-Evaluation  11/07/19    Authorization Type  UHC Medicare    PT Start Time  1123    PT Stop Time  1201    PT Time Calculation (min)  38 min    Activity Tolerance  Patient tolerated treatment well    Behavior During Therapy  [facility] for tasks assessed/performed       Past Medical History:  Diagnosis Date  . Blindness   . GERD (gastroesophageal reflux disease)   . Multiple sclerosis ([place])   . Osteoporosis     Past Surgical History:  Procedure Laterality Date  . ABDOMINAL HYSTERECTOMY    . CYSTOSCOPY WITH RETROGRADE PYELOGRAM, URETEROSCOPY AND STENT PLACEMENT Left 08/01/2018   Procedure: CYSTOSCOPY WITH RETROGRADE PYELOGRAM, URETEROSCOPY, HOLMIUM LASER AND STENT PLACEMENT;  Surgeon: Lucas Mallow, MD;  Location: [facility];  Service: Urology;  Laterality: Left;  . ESOPHAGOGASTRODUODENOSCOPY N/A 11/06/2012   Procedure: ESOPHAGOGASTRODUODENOSCOPY (EGD);  Surgeon: Lear Ng, MD;  Location: [facility] END[facility];  Service: Endoscopy;  Laterality: N/A;    Vitals:   10/19/19 1127  BP: 132/62  Pulse: 80  SpO2: 98%    Subjective Assessment - 10/19/19 1129    Subjective  Present with caregiver. Notes that she fell 2 nights ago when her knees gave out when standing up from the toilet. Feels like her R leg is still recouperating- fell with her knees in a bent position.  Denies bruising.    Pertinent History  MS, blind, Osteoporosis, GERD, venous stasis, h/o eye surgery, h/o sepsis with hospitalization, UTI's, Left BBB, Left Ventiricular Hypertrophy     Patient Stated Goals  "I want to walk again"    Currently in Pain?  No/denies                       [facility] Adult PT Treatment/Exercise - 10/19/19 0001      Neuro Re-ed    Neuro Re-ed Details   sitting rolling orange physioball forward, R, L 5x each side; upright sitting + rotating ball at chest R/L x7- requiring min A and cues to encourage anterior trunk lean      Knee/Hip Exercises: Aerobic   Nustep  L3 x7 min (UE/LEs)      Manual Therapy   Manual Therapy  Passive ROM    Passive ROM  R/L figure 4 stretch 30" to tolerance             PT Education - 10/19/19 1205    Education Details  advised patient to ice R knee for 10 minutes as needed for pain and swelling    Person(s) Educated  Patient;Caregiver(s)    Methods  Explanation    Comprehension  Verbalized understanding       PT Short Term Goals - 09/19/19 1211      PT SHORT TERM GOAL #1   Title  Pt will be modified independent in her HEP    Time  4    Period  Weeks    Status  Achieved   reporting compliance until recent exaccerbation of weakness   Target Date  10/10/19      PT SHORT TERM GOAL #2   Title  Pt will be able to tolerate 15 minutes of exercise without rest break.    Time  4    Period  Weeks    Status  Partially Met   reporting 10 min before requiring rest break   Target Date  10/10/19        PT Long Term Goals - 09/25/19 1209      PT LONG TERM GOAL #1   Title  Pt will be able to perform sit to stand with Contact Guard assistance with rolling walker.    Baseline  Mod-max assistance on 08/01/2019    Time  8    Period  Weeks    Status  On-going   requiring mod A x 2 without RW     PT LONG TERM GOAL #2   Title  Pt will be able to perform sit to stand transfer with minimal assistance using LRAD.    Baseline  max assistance on 08/01/2019    Time  8    Period  Weeks    Status  On-going   requiring mod A x 2 without RW     PT LONG TERM GOAL #3   Title  Pt will be able to perform  supine to sit modified independently.    Baseline  requires varying assistance from caregivers    Time  8    Period  Weeks    Status  Deferred   NT     PT LONG TERM GOAL #4   Title  Patient to demonstrate and recall importance of maintaining more upright sitting posture in W/C and report ability to maintain 10 min of upright sitting.    Time  8    Period  Weeks    Status  On-going            Plan - 10/19/19 1207    Clinical Impression Statement  Patient presenting with caregiver today. Noting that 2 days ago she fell with B knees in a flexed position after standing up from the toilet. R knee nontender but with slight anterior knee edema, thus educated patient and caregiver on use of ice as needed for swelling. Worked on sitting weight shifting forward with improvement in core activation and anterior trunk lean. Required min A to maintain upright sitting posture while performing trunk rotations. Patient was able to demonstrate sitting figure 4 stretch with minor assistance from PT in order to maintain PROM stretch. Ended session with nustep for reciprocal ROM and UE/LE strengthening, which patient was able to tolerate with more resistance and length of time on machine. No complaints at end of session. Patient progressing well towards goals.    Comorbidities  MS, blind, Osteoporosis, GERD, venous stasis, h/o eye surgery, h/o sepsis with hospitalization, UTI's, Left BBB, Left Ventiricular Hypertrophy    Stability/Clinical Decision Making  Evolving/Moderate complexity    Rehab Potential  Fair    PT Frequency  2x / week    PT Duration  8 weeks    PT Treatment/Interventions  ADLs/Self Care Home Management;Therapeutic activities;Gait training;Functional mobility training;Neuromuscular re-education;Balance training;Therapeutic exercise;Patient/family education;Wheelchair mobility training;Manual techniques;Passive range of motion;Energy conservation;Cryotherapy;Moist Heat    PT Next Visit Plan   transfers (sit to stand, w/c to mat), LE strengthening, UE strengthening    PT Home Exercise Plan  Access Code: [email or web address] (rows with  red therdand, trunk flexion/forward reaching with activation of quads for standing)    Consulted and Agree with Plan of Care  Patient;Family member/caregiver    Family Member Consulted  [place] (CNA)       Patient will benefit from skilled therapeutic intervention in order to improve the following deficits and impairments:  Impaired flexibility, Decreased balance, Decreased activity tolerance, Decreased range of motion, Decreased strength, Postural dysfunction, Decreased mobility, Pain, Improper body mechanics  Visit Diagnosis: Repeated falls  Muscle weakness (generalized)  Impaired flexibility of lower extremity     Problem List Patient Active Problem List   Diagnosis Date Noted  . Elevated troponin 07/13/2018  . LBBB (left bundle branch block) 07/13/2018  . LVH (left ventricular hypertrophy) 07/13/2018  . Coronary artery calcification seen on CAT scan 07/13/2018  . Bacteremia due to Enterococcus 07/02/2018  . E. coli UTI 07/02/2018  . Iron deficiency anemia 07/02/2018  . Hypomagnesemia 07/02/2018  . Hypocalcemia 07/02/2018  . Hypokalemia 07/02/2018  . Coronary artery disease 07/02/2018  . H/O eye surgery 07/02/2018  . Abdominal pain   . Acute lower UTI 06/25/2018  . Sepsis ([place]) 06/25/2018  . Acute cystitis with hematuria   . Venous stasis 11/20/2014  . Lung nodule < 6cm on CT 11/20/2014  . Left lower lobe pneumonia 11/17/2014  . Influenza A (H1N1) 11/06/2012  . Hematemesis 11/06/2012  . CAP (community acquired pneumonia) 11/05/2012  . Hypoxemia 11/05/2012  . SOB (shortness of breath) 11/05/2012  . Multiple sclerosis ([place]) 11/05/2012     Janene Harvey, PT, DPT 10/19/19 12:13 PM   [facility] [address]  [place], [place], [address] Phone: [phone]   Fax:   [phone]  Name: JYA HUGHSTON MRN: [id number] Date of Birth: [birth year]

## 2019-10-20 ENCOUNTER — Ambulatory Visit: Payer: Medicare Other | Admitting: Physical Therapy

## 2019-10-20 ENCOUNTER — Encounter: Payer: Self-pay | Admitting: Physical Therapy

## 2019-10-20 VITALS — BP 110/64 | HR 79

## 2019-10-20 DIAGNOSIS — M6281 Muscle weakness (generalized): Secondary | ICD-10-CM

## 2019-10-20 DIAGNOSIS — R296 Repeated falls: Secondary | ICD-10-CM | POA: Diagnosis not present

## 2019-10-20 DIAGNOSIS — R29898 Other symptoms and signs involving the musculoskeletal system: Secondary | ICD-10-CM

## 2019-10-20 NOTE — Therapy (Signed)
[facility] [address]  [place], [place], [address] Phone: [phone]   Fax:  [phone]  Physical Therapy Treatment  Patient Details  Name: Heather Lutz MRN: [id number] Date of Birth: 1946/09/11 Referring Provider (PT): Adline Mango, MD   Encounter Date: 10/20/2019  PT End of Session - 10/20/19 1152    Visit Number  9    Number of Visits  18    Date for PT Re-Evaluation  11/07/19    Authorization Type  UHC Medicare    PT Start Time  1059    PT Stop Time  1145    PT Time Calculation (min)  46 min    Activity Tolerance  Patient tolerated treatment well;Patient limited by fatigue    Behavior During Therapy  [facility] for tasks assessed/performed       Past Medical History:  Diagnosis Date  . Blindness   . GERD (gastroesophageal reflux disease)   . Multiple sclerosis ([place])   . Osteoporosis     Past Surgical History:  Procedure Laterality Date  . ABDOMINAL HYSTERECTOMY    . CYSTOSCOPY WITH RETROGRADE PYELOGRAM, URETEROSCOPY AND STENT PLACEMENT Left 08/01/2018   Procedure: CYSTOSCOPY WITH RETROGRADE PYELOGRAM, URETEROSCOPY, HOLMIUM LASER AND STENT PLACEMENT;  Surgeon: Lucas Mallow, MD;  Location: [facility];  Service: Urology;  Laterality: Left;  . ESOPHAGOGASTRODUODENOSCOPY N/A 11/06/2012   Procedure: ESOPHAGOGASTRODUODENOSCOPY (EGD);  Surgeon: Lear Ng, MD;  Location[facility] END[facility]COPY;  Service: Endoscopy;  Laterality: N/A;    Vitals:   10/20/19 1100  BP: 110/64  Pulse: 79  SpO2: 100%    Subjective Assessment - 10/20/19 1105    Subjective  R knee was feeling a littel tight but is feeling better. No issues since last session- was able to stand when she got up from the toilet today.    Pertinent History  MS, blind, Osteoporosis, GERD, venous stasis, h/o eye surgery, h/o sepsis with hospitalization, UTI's, Left BBB, Left Ventiricular Hypertrophy    Patient Stated Goals  "I want to walk again"    Currently in Pain?  No/denies                       [facility] Adult PT Treatment/Exercise - 10/20/19 0001      Knee/Hip Exercises: Stretches   Passive Hamstring Stretch  Right;Left;30 seconds;2 reps    Passive Hamstring Stretch Limitations  sitting in w/c with lumbar roll and heel on stool   assistance required to bend at hips   Gastroc Stretch  Right;Left;30 seconds;2 reps    Gastroc Stretch Limitations  sitting in w/c with strap and heel on stool      Knee/Hip Exercises: Aerobic   Nustep  L3 x7 min (UE/LEs)      Knee/Hip Exercises: Seated   Long Arc Quad  Strengthening;Right;Left;1 set;5 reps    Long Arc Quad Weight  1 lbs.    Long [employer] Limitations  limited ROM B LEs, R>L    Other Seated Knee/Hip Exercises  L ankle heel raise AROM/toe raise AAROM x10; R ankle toe raise AAROM/heel raise AAROM x5   c/o fatigue     Manual Therapy   Manual Therapy  Passive ROM    Passive ROM  R/L figure 4 stretch 2x30" to tolerance               PT Short Term Goals - 09/19/19 1211      PT SHORT TERM GOAL #  1   Title  Pt will be modified independent in her HEP    Time  4    Period  Weeks    Status  Achieved   reporting compliance until recent exaccerbation of weakness   Target Date  10/10/19      PT SHORT TERM GOAL #2   Title  Pt will be able to tolerate 15 minutes of exercise without rest break.    Time  4    Period  Weeks    Status  Partially Met   reporting 10 min before requiring rest break   Target Date  10/10/19        PT Long Term Goals - 09/25/19 1209      PT LONG TERM GOAL #1   Title  Pt will be able to perform sit to stand with Contact Guard assistance with rolling walker.    Baseline  Mod-max assistance on 08/01/2019    Time  8    Period  Weeks    Status  On-going   requiring mod A x 2 without RW     PT LONG TERM GOAL #2   Title  Pt will be able to perform sit to stand transfer with minimal assistance using LRAD.    Baseline  max assistance  on 08/01/2019    Time  8    Period  Weeks    Status  On-going   requiring mod A x 2 without RW     PT LONG TERM GOAL #3   Title  Pt will be able to perform supine to sit modified independently.    Baseline  requires varying assistance from caregivers    Time  8    Period  Weeks    Status  Deferred   NT     PT LONG TERM GOAL #4   Title  Patient to demonstrate and recall importance of maintaining more upright sitting posture in W/C and report ability to maintain 10 min of upright sitting.    Time  8    Period  Weeks    Status  On-going            Plan - 10/20/19 1152    Clinical Impression Statement  Patient reporting no complaints since last session. Notes that she is having a good day today as she was able to stand up from the toilet with improved ease. Focused session on LE strengthening and stretching in w/c today. Patient with relative improvement in tolerance for figure 4 stretch on B LEs compared to previous sessions. Able to demonstrate LAQ with light weighted resistance, with cues provided on R LE to return LE back into relative flexion before proceeding with next rep. Initiated B ankle strengthening with patient demonstrating limited ROM and requiring AAROM rather than AROM. Patient reporting fatigue with this exercise. Ended session without complaints. Patient demonstrating progress in exercise tolerance.    Comorbidities  MS, blind, Osteoporosis, GERD, venous stasis, h/o eye surgery, h/o sepsis with hospitalization, UTI's, Left BBB, Left Ventiricular Hypertrophy    Stability/Clinical Decision Making  Evolving/Moderate complexity    Rehab Potential  Fair    PT Frequency  2x / week    PT Duration  8 weeks    PT Treatment/Interventions  ADLs/Self Care Home Management;Therapeutic activities;Gait training;Functional mobility training;Neuromuscular re-education;Balance training;Therapeutic exercise;Patient/family education;Wheelchair mobility training;Manual techniques;Passive  range of motion;Energy conservation;Cryotherapy;Moist Heat    PT Next Visit Plan  transfers (sit to stand, w/c to mat), LE strengthening, UE strengthening  PT Home Exercise Plan  Access Code: [email or web address] (rows with red therdand, trunk flexion/forward reaching with activation of quads for standing)    Consulted and Agree with Plan of Care  Patient;Family member/caregiver    Family Member Consulted  [place] (CNA)       Patient will benefit from skilled therapeutic intervention in order to improve the following deficits and impairments:  Impaired flexibility, Decreased balance, Decreased activity tolerance, Decreased range of motion, Decreased strength, Postural dysfunction, Decreased mobility, Pain, Improper body mechanics  Visit Diagnosis: Repeated falls  Muscle weakness (generalized)  Impaired flexibility of lower extremity     Problem List Patient Active Problem List   Diagnosis Date Noted  . Elevated troponin 07/13/2018  . LBBB (left bundle branch block) 07/13/2018  . LVH (left ventricular hypertrophy) 07/13/2018  . Coronary artery calcification seen on CAT scan 07/13/2018  . Bacteremia due to Enterococcus 07/02/2018  . E. coli UTI 07/02/2018  . Iron deficiency anemia 07/02/2018  . Hypomagnesemia 07/02/2018  . Hypocalcemia 07/02/2018  . Hypokalemia 07/02/2018  . Coronary artery disease 07/02/2018  . H/O eye surgery 07/02/2018  . Abdominal pain   . Acute lower UTI 06/25/2018  . Sepsis ([place]) 06/25/2018  . Acute cystitis with hematuria   . Venous stasis 11/20/2014  . Lung nodule < 6cm on CT 11/20/2014  . Left lower lobe pneumonia 11/17/2014  . Influenza A (H1N1) 11/06/2012  . Hematemesis 11/06/2012  . CAP (community acquired pneumonia) 11/05/2012  . Hypoxemia 11/05/2012  . SOB (shortness of breath) 11/05/2012  . Multiple sclerosis ([place]) 11/05/2012     Janene Harvey, PT, DPT 10/20/19 11:56 AM   [facility] [address]  [place], [place], [address] Phone: [phone]   Fax:  [phone]  Name: YUDITH NORLANDER MRN: [id number] Date of Birth: 29-Jul-1946

## 2019-10-23 ENCOUNTER — Other Ambulatory Visit: Payer: Self-pay

## 2019-10-23 ENCOUNTER — Encounter: Payer: Self-pay | Admitting: Physical Therapy

## 2019-10-23 ENCOUNTER — Ambulatory Visit: Payer: Medicare Other | Admitting: Physical Therapy

## 2019-10-23 VITALS — BP 133/78 | HR 76

## 2019-10-23 DIAGNOSIS — R296 Repeated falls: Secondary | ICD-10-CM

## 2019-10-23 DIAGNOSIS — M6281 Muscle weakness (generalized): Secondary | ICD-10-CM

## 2019-10-23 DIAGNOSIS — R29898 Other symptoms and signs involving the musculoskeletal system: Secondary | ICD-10-CM

## 2019-10-23 NOTE — Therapy (Signed)
[facility] [address]  [place], [place], [address] Phone: [phone]   Fax:  [phone]  Physical Therapy Progress Note  Patient Details  Name: Heather Lutz MRN: [id number] Date of Birth: 1946/01/25 Referring Provider (PT): Adline Mango, MD   Progress Note Reporting Period 08/01/19 to 10/23/19  See note below for Objective Data and Assessment of Progress/Goals.    Encounter Date: 10/23/2019  PT End of Session - 10/23/19 1157    Visit Number  10    Number of Visits  18    Date for PT Re-Evaluation  11/07/19    Authorization Type  [employer] Medicare    PT Start Time  1101    PT Stop Time  1144    PT Time Calculation (min)  43 min    Equipment Utilized During Treatment  Gait belt    Activity Tolerance  Patient tolerated treatment well;Patient limited by fatigue    Behavior During Therapy  WFL for tasks assessed/performed       Past Medical History:  Diagnosis Date  . Blindness   . GERD (gastroesophageal reflux disease)   . Multiple sclerosis ([place])   . Osteoporosis     Past Surgical History:  Procedure Laterality Date  . ABDOMINAL HYSTERECTOMY    . CYSTOSCOPY WITH RETROGRADE PYELOGRAM, URETEROSCOPY AND STENT PLACEMENT Left 08/01/2018   Procedure: CYSTOSCOPY WITH RETROGRADE PYELOGRAM, URETEROSCOPY, HOLMIUM LASER AND STENT PLACEMENT;  Surgeon: Lucas Mallow, MD;  Location: [facility];  Service: Urology;  Laterality: Left;  . ESOPHAGOGASTRODUODENOSCOPY N/A 11/06/2012   Procedure: ESOPHAGOGASTRODUODENOSCOPY (EGD);  Surgeon: Lear Ng, MD;  Location: [facility] ENDOSCOPY;  Service: Endoscopy;  Laterality: N/A;    Vitals:   10/23/19 1102  BP: 133/78  Pulse: 76  SpO2: 97%    Subjective Assessment - 10/23/19 1108    Subjective  Present with caregiver. Notes that she is feeling fine today. Feels that she is stronger and lighter since starting PT. Would like to continue working on strength and flexibilty  from the waist down. Would like to get to the point when she can put her underwear on herself and to stand a little longer.    Pertinent History  MS, blind, Osteoporosis, GERD, venous stasis, h/o eye surgery, h/o sepsis with hospitalization, UTI's, Left BBB, Left Ventiricular Hypertrophy    Patient Stated Goals  "I want to walk again"    Currently in Pain?  No/denies                       [facility] Adult PT Treatment/Exercise - 10/23/19 0001      Bed Mobility   Bed Mobility  Rolling Right;Rolling Left;Right Sidelying to Sit;Sit to Sidelying Right;Sitting - Scoot to Edge of Bed    Rolling Right  Minimal Assistance - Patient > 75%   cues to reach across with UE   Rolling Left  Minimal Assistance - Patient > 75%   cues to reach across with UE   Right Sidelying to Sit  2 Helpers;Minimal Assistance - Patient > 75%   cues to push up off of R UE   Sitting - Scoot to Edge of Bed  Minimal Assistance - Patient > 75%   much improved ability, assistance only to support pt in sitt   Sit to Sidelying Right  2 Helpers;Minimal Assistance - Patient > 75%      Transfers   Transfers  Sit to Stand  Sit to Stand  4: Min assist   x2   Sit to Stand Details  Tactile cues for sequencing;Tactile cues for weight shifting;Tactile cues for posture;Tactile cues for placement    Sit to Stand Details (indicate cue type and reason)  cues to activate glutes upon sitting for more upright trunk; manual cues at B knees to avoid buckling    Number of Reps  --   3     Neuro Re-ed    Neuro Re-ed Details   supported sitting with PT assistance and cues to shift hips anteriorly and retract scapulae; weight shifting on forearms and pushing back up   min A required for sitting- much improved tolerance and post     Knee/Hip Exercises: Aerobic   Nustep  L5 x7 min (UE/LEs)      Manual Therapy   Manual Therapy  Passive ROM    Passive ROM  butterfly stretch 3x30" with gentle OP; L hip flexor stretch 2x30" to  tolerance   black wedge and 2 pillows              PT Short Term Goals - 10/23/19 1111      PT SHORT TERM GOAL #1   Title  Pt will be modified independent in her HEP    Time  4    Period  Weeks    Status  Achieved   reporting compliance until recent exaccerbation of weakness   Target Date  10/10/19      PT SHORT TERM GOAL #2   Title  Pt will be able to tolerate 15 minutes of exercise without rest break.    Time  4    Period  Weeks    Status  Partially Met   reporting 10 min before requiring rest break   Target Date  10/10/19        PT Long Term Goals - 10/23/19 1113      PT LONG TERM GOAL #1   Title  Pt will be able to perform sit to stand with Contact Guard assistance with rolling walker.    Baseline  Mod-max assistance on 08/01/2019    Time  8    Period  Weeks    Status  On-going   requiring min A x 2 with RW     PT LONG TERM GOAL #2   Title  Pt will be able to perform sit to stand transfer with minimal assistance using LRAD.    Baseline  max assistance on 08/01/2019    Time  8    Period  Weeks    Status  On-going   requiring min A x 2 with RW     PT LONG TERM GOAL #3   Title  Pt will be able to perform supine to sit modified independently.    Baseline  requires varying assistance from caregivers    Time  8    Period  Weeks    Status  On-going   requiring min A with rolling and min A x2 sidelying to sit     PT LONG TERM GOAL #4   Title  Patient to demonstrate and recall importance of maintaining more upright sitting posture in W/C and report ability to maintain 10 min of upright sitting.    Time  8    Period  Weeks    Status  Partially Met   good carryover; reporting she can tolerate lumbar support for a couple hours  Plan - 10/23/19 1201    Clinical Impression Statement  Patient present with caregiver. Patient reporting good improvement in strength and upright sitting ability since starting PT. Notes that she would like to  continue working on LE strength and flexibility, ultimately working towards being able to don her underwear herself and be able to stand for a longer duration. Patient notes that at this time she is about to tolerate about 10 minutes of HEP before a rest break is required. Was able to recall good understanding of importance of more upright sitting, and with caregiver reporting that patient is able to tolerate a couple hours of sitting with use of lumbar roll. Patient demonstrated improved ability to perform STS transfers with RW assistance- today requiring min A x 2. Patient required cues to activate glutes upon sitting for more upright trunk as well as manual cues at B knees to avoid buckling. However, demonstrated much improved ability to set herself up for the transfer as well as maintain more upright standing posture once standing. Patient also demonstrated improved ability to perform bed mobility, as well as improved supine tolerance with wedge and 2 pillows in order to tolerate passive hip stretching. Overall, patient is demonstrating much improved core and LE strength as evidence by improved ability to perform transfers and bed mobility. Plan to continue per POC in order to address remaining goals.    Comorbidities  MS, blind, Osteoporosis, GERD, venous stasis, h/o eye surgery, h/o sepsis with hospitalization, UTI's, Left BBB, Left Ventiricular Hypertrophy    Stability/Clinical Decision Making  Evolving/Moderate complexity    Rehab Potential  Fair    PT Frequency  2x / week    PT Duration  8 weeks    PT Treatment/Interventions  ADLs/Self Care Home Management;Therapeutic activities;Gait training;Functional mobility training;Neuromuscular re-education;Balance training;Therapeutic exercise;Patient/family education;Wheelchair mobility training;Manual techniques;Passive range of motion;Energy conservation;Cryotherapy;Moist Heat    PT Next Visit Plan  transfers (sit to stand, w/c to mat), LE strengthening,  UE strengthening    PT Home Exercise Plan  Access Code: [email or web address] (rows with red therdand, trunk flexion/forward reaching with activation of quads for standing)    Consulted and Agree with Plan of Care  Patient;Family member/caregiver    Family Member Consulted  [place] (CNA)       Patient will benefit from skilled therapeutic intervention in order to improve the following deficits and impairments:  Impaired flexibility, Decreased balance, Decreased activity tolerance, Decreased range of motion, Decreased strength, Postural dysfunction, Decreased mobility, Pain, Improper body mechanics  Visit Diagnosis: Repeated falls  Muscle weakness (generalized)  Impaired flexibility of lower extremity     Problem List Patient Active Problem List   Diagnosis Date Noted  . Elevated troponin 07/13/2018  . LBBB (left bundle branch block) 07/13/2018  . LVH (left ventricular hypertrophy) 07/13/2018  . Coronary artery calcification seen on CAT scan 07/13/2018  . Bacteremia due to Enterococcus 07/02/2018  . E. coli UTI 07/02/2018  . Iron deficiency anemia 07/02/2018  . Hypomagnesemia 07/02/2018  . Hypocalcemia 07/02/2018  . Hypokalemia 07/02/2018  . Coronary artery disease 07/02/2018  . H/O eye surgery 07/02/2018  . Abdominal pain   . Acute lower UTI 06/25/2018  . Sepsis ([place]) 06/25/2018  . Acute cystitis with hematuria   . Venous stasis 11/20/2014  . Lung nodule < 6cm on CT 11/20/2014  . Left lower lobe pneumonia 11/17/2014  . Influenza A (H1N1) 11/06/2012  . Hematemesis 11/06/2012  . CAP (community acquired pneumonia) 11/05/2012  . Hypoxemia 11/05/2012  . SOB (  shortness of breath) 11/05/2012  . Multiple sclerosis ([place]) 11/05/2012     Janene Harvey, PT, DPT 10/23/19 12:10 PM   [facility] [address]  [place], [place], [address] Phone: [phone]   Fax:  [phone]  Name: TRENITA HULME MRN:  [id number] Date of Birth: May 26, 1946

## 2019-10-31 ENCOUNTER — Ambulatory Visit: Payer: Medicare Other | Admitting: Physical Therapy

## 2019-11-03 ENCOUNTER — Other Ambulatory Visit: Payer: Self-pay

## 2019-11-03 ENCOUNTER — Ambulatory Visit: Payer: Medicare Other | Admitting: Physical Therapy

## 2019-11-03 ENCOUNTER — Encounter: Payer: Self-pay | Admitting: Physical Therapy

## 2019-11-03 VITALS — BP 125/66 | HR 89

## 2019-11-03 DIAGNOSIS — R296 Repeated falls: Secondary | ICD-10-CM

## 2019-11-03 DIAGNOSIS — M6281 Muscle weakness (generalized): Secondary | ICD-10-CM

## 2019-11-03 DIAGNOSIS — R29898 Other symptoms and signs involving the musculoskeletal system: Secondary | ICD-10-CM

## 2019-11-03 NOTE — Therapy (Signed)
[facility] [address]  [place], [place], [address] Phone: [phone]   Fax:  [phone]  Physical Therapy Treatment  Patient Details  Name: Heather Lutz MRN: [id number] Date of Birth: 1946-03-12 Referring Provider (PT): Adline Mango, MD   Encounter Date: 11/03/2019  PT End of Session - 11/03/19 1157    Visit Number  11    Number of Visits  18    Date for PT Re-Evaluation  11/07/19    Authorization Type  [employer]HC Medicare    PT Start Time  1103    PT Stop Time  1156    PT Time Calculation (min)  53 min    Equipment Utilized During Treatment  Gait belt    Activity Tolerance  Patient tolerated treatment well    Behavior During Therapy  [facility] for tasks assessed/performed       Past Medical History:  Diagnosis Date  . Blindness   . GERD (gastroesophageal reflux disease)   . Multiple sclerosis ([place])   . Osteoporosis     Past Surgical History:  Procedure Laterality Date  . ABDOMINAL HYSTERECTOMY    . CYSTOSCOPY WITH RETROGRADE PYELOGRAM, URETEROSCOPY AND STENT PLACEMENT Left 08/01/2018   Procedure: CYSTOSCOPY WITH RETROGRADE PYELOGRAM, URETEROSCOPY, HOLMIUM LASER AND STENT PLACEMENT;  Surgeon: Lucas Mallow, MD;  Location: [facility];  Service: Urology;  Laterality: Left;  . ESOPHAGOGASTRODUODENOSCOPY N/A 11/06/2012   Procedure: ESOPHAGOGASTRODUODENOSCOPY (EGD);  Surgeon: Lear Ng, MD;  Location: [facility] ENDOSCOPY;  Service: Endoscopy;  Laterality: N/A;    Vitals:   11/03/19 1107  BP: 125/66  Pulse: 89  SpO2: 99%    Subjective Assessment - 11/03/19 1106    Subjective  Was having loss of energy, fatigue, and arm pain after her 1st COVID-19 vaccine. Feeling like she is still dealing with some weakness.    Pertinent History  MS, blind, Osteoporosis, GERD, venous stasis, h/o eye surgery, h/o sepsis with hospitalization, UTI's, Left BBB, Left Ventiricular Hypertrophy    Patient Stated Goals  "I want to  walk again"    Currently in Pain?  No/denies                       [facility] Adult PT Treatment/Exercise - 11/03/19 0001      Bed Mobility   Bed Mobility  Rolling Right;Rolling Left;Right Sidelying to Sit;Sit to Sidelying Right;Sitting - Scoot to Edge of Bed    Rolling Right  Minimal Assistance - Patient > 75%    Rolling Left  Minimal Assistance - Patient > 75%    Right Sidelying to Sit  2 Helpers;Minimal Assistance - Patient > 75%    Sitting - Scoot to Edge of Bed  Minimal Assistance - Patient > 75%   supported sitting   Sit to Sidelying Right  2 Helpers;Minimal Assistance - Patient > 75%      Transfers   Transfers  Sit to Stand    Sit to Stand  3: Mod assist   min/mod A x 2 + RW   Sit to Stand Details (indicate cue type and reason)  manual cues at knees to avoid buckling; manual cues for foot positioning, verbal cues for scooting forwad, verbal cues for anterior trunk lean    Number of Reps  --   3     Neuro Re-ed    Neuro Re-ed Details   supported sitting + orange pball lateral roll to R and sitting up  tall x10, L forearm weight shift and push back up x5; modified sit up with posterior support 2x5      Knee/Hip Exercises: Aerobic   Nustep  L5 x7 min (UE/LEs)      Knee/Hip Exercises: Supine   Bridges  Strengthening;Both;1 set;10 reps    Bridges Limitations  glute set with intermittent manual assistance to push down through feet and lift hips      Manual Therapy   Manual Therapy  Passive ROM    Passive ROM  L hip flexor stretch in sidelying 2x30", R/L HS stretch 2x30", R/L gastroc stretch 30" each               PT Short Term Goals - 10/23/19 1111      PT SHORT TERM GOAL #1   Title  Pt will be modified independent in her HEP    Time  4    Period  Weeks    Status  Achieved   reporting compliance until recent exaccerbation of weakness   Target Date  10/10/19      PT SHORT TERM GOAL #2   Title  Pt will be able to tolerate 15 minutes of exercise  without rest break.    Time  4    Period  Weeks    Status  Partially Met   reporting 10 min before requiring rest break   Target Date  10/10/19        PT Long Term Goals - 10/23/19 1113      PT LONG TERM GOAL #1   Title  Pt will be able to perform sit to stand with Contact Guard assistance with rolling walker.    Baseline  Mod-max assistance on 08/01/2019    Time  8    Period  Weeks    Status  On-going   requiring min A x 2 with RW     PT LONG TERM GOAL #2   Title  Pt will be able to perform sit to stand transfer with minimal assistance using LRAD.    Baseline  max assistance on 08/01/2019    Time  8    Period  Weeks    Status  On-going   requiring min A x 2 with RW     PT LONG TERM GOAL #3   Title  Pt will be able to perform supine to sit modified independently.    Baseline  requires varying assistance from caregivers    Time  8    Period  Weeks    Status  On-going   requiring min A with rolling and min A x2 sidelying to sit     PT LONG TERM GOAL #4   Title  Patient to demonstrate and recall importance of maintaining more upright sitting posture in W/C and report ability to maintain 10 min of upright sitting.    Time  8    Period  Weeks    Status  Partially Met   good carryover; reporting she can tolerate lumbar support for a couple hours           Plan - 11/03/19 1158    Clinical Impression Statement  Patient present with caregiver. Patient reported onset of fatigue and weakness after receiving 1st dose of COVID-19 vaccine, with mild lasting effects remaining today. Worked on STS transfers with patient still demonstrating knee buckling upon standing requiring manual assistance to maintain TKE. Did show better ability to maintain upright trunk upon standing. Worked on sitting balance  and core strengthening with much improved ability to maintain upright sitting with very minimal intermittent support. Even able to perform modified sit ups. Worked on passive LE  stretching d/t patient's c/o tightness. Stretching limited d/t intermittent spasticity in B LEs. Patient tolerated all exercises well today and without complaints at end of session. Progressing well towards goals.    Comorbidities  MS, blind, Osteoporosis, GERD, venous stasis, h/o eye surgery, h/o sepsis with hospitalization, UTI's, Left BBB, Left Ventiricular Hypertrophy    Stability/Clinical Decision Making  Evolving/Moderate complexity    Rehab Potential  Fair    PT Frequency  2x / week    PT Duration  8 weeks    PT Treatment/Interventions  ADLs/Self Care Home Management;Therapeutic activities;Gait training;Functional mobility training;Neuromuscular re-education;Balance training;Therapeutic exercise;Patient/family education;Wheelchair mobility training;Manual techniques;Passive range of motion;Energy conservation;Cryotherapy;Moist Heat    PT Next Visit Plan  transfers (sit to stand, w/c to mat), LE strengthening, UE strengthening    PT Home Exercise Plan  Access Code: [email or web address] (rows with red therdand, trunk flexion/forward reaching with activation of quads for standing)    Consulted and Agree with Plan of Care  Patient;Family member/caregiver    Family Member Consulted  [place] (CNA)       Patient will benefit from skilled therapeutic intervention in order to improve the following deficits and impairments:  Impaired flexibility, Decreased balance, Decreased activity tolerance, Decreased range of motion, Decreased strength, Postural dysfunction, Decreased mobility, Pain, Improper body mechanics  Visit Diagnosis: Repeated falls  Muscle weakness (generalized)  Impaired flexibility of lower extremity     Problem List Patient Active Problem List   Diagnosis Date Noted  . Elevated troponin 07/13/2018  . LBBB (left bundle branch block) 07/13/2018  . LVH (left ventricular hypertrophy) 07/13/2018  . Coronary artery calcification seen on CAT scan 07/13/2018  . Bacteremia due to Enterococcus  07/02/2018  . E. coli UTI 07/02/2018  . Iron deficiency anemia 07/02/2018  . Hypomagnesemia 07/02/2018  . Hypocalcemia 07/02/2018  . Hypokalemia 07/02/2018  . Coronary artery disease 07/02/2018  . H/O eye surgery 07/02/2018  . Abdominal pain   . Acute lower UTI 06/25/2018  . Sepsis ([place]) 06/25/2018  . Acute cystitis with hematuria   . Venous stasis 11/20/2014  . Lung nodule < 6cm on CT 11/20/2014  . Left lower lobe pneumonia 11/17/2014  . Influenza A (H1N1) 11/06/2012  . Hematemesis 11/06/2012  . CAP (community acquired pneumonia) 11/05/2012  . Hypoxemia 11/05/2012  . SOB (shortness of breath) 11/05/2012  . Multiple sclerosis ([place]) 11/05/2012     Janene Harvey, PT, DPT 11/03/19 12:08 PM   [facility] [address]  [place], [place], [address] Phone: [phone]   Fax:  [phone]  Name: ATALIE OROS MRN: [id number] Date of Birth: 10/24/1945

## 2019-11-07 ENCOUNTER — Ambulatory Visit: Payer: Medicare Other | Admitting: Physical Therapy

## 2019-11-07 ENCOUNTER — Other Ambulatory Visit: Payer: Self-pay

## 2019-11-07 ENCOUNTER — Encounter: Payer: Self-pay | Admitting: Physical Therapy

## 2019-11-07 VITALS — BP 118/75 | HR 79

## 2019-11-07 DIAGNOSIS — M6281 Muscle weakness (generalized): Secondary | ICD-10-CM

## 2019-11-07 DIAGNOSIS — R296 Repeated falls: Secondary | ICD-10-CM | POA: Diagnosis not present

## 2019-11-07 NOTE — Therapy (Signed)
[facility] [address]  [place], [place], [address] Phone: [phone]   Fax:  [phone]  Physical Therapy Treatment  Patient Details  Name: Heather Lutz MRN: [id number] Date of Birth: 02-Apr-1946 Referring Provider (PT): Adline Mango, MD   Encounter Date: 11/07/2019  PT End of Session - 11/07/19 1205    Visit Number  12    Number of Visits  24    Date for PT Re-Evaluation  12/19/19    Authorization Type  [employer] Medicare    PT Start Time  1102    PT Stop Time  1158    PT Time Calculation (min)  56 min    Equipment Utilized During Treatment  Gait belt    Activity Tolerance  Patient tolerated treatment well    Behavior During Therapy  [facility] for tasks assessed/performed       Past Medical History:  Diagnosis Date  . Blindness   . GERD (gastroesophageal reflux disease)   . Multiple sclerosis ([place])   . Osteoporosis     Past Surgical History:  Procedure Laterality Date  . ABDOMINAL HYSTERECTOMY    . CYSTOSCOPY WITH RETROGRADE PYELOGRAM, URETEROSCOPY AND STENT PLACEMENT Left 08/01/2018   Procedure: CYSTOSCOPY WITH RETROGRADE PYELOGRAM, URETEROSCOPY, HOLMIUM LASER AND STENT PLACEMENT;  Surgeon: Lucas Mallow, MD;  Location: [facility];  Service: Urology;  Laterality: Left;  . ESOPHAGOGASTRODUODENOSCOPY N/A 11/06/2012   Procedure: ESOPHAGOGASTRODUODENOSCOPY (EGD);  Surgeon: Lear Ng, MD;  Location: [facility] ENDOSCOPY;  Service: Endoscopy;  Laterality: N/A;    Vitals:   11/07/19 1104  BP: 118/75  Pulse: 79  SpO2: 98%    Subjective Assessment - 11/07/19 1106    Subjective  Reports 60% improvement since initial eval. Would like to continue working on flexibility, core strength, and ability to stand. Still having a little bit of weakness from her COVID shot. Felt a little weak when standing up from toilet d/t knees giving out.    Pertinent History  MS, blind, Osteoporosis, GERD, venous stasis, h/o eye  surgery, h/o sepsis with hospitalization, UTI's, Left BBB, Left Ventiricular Hypertrophy    Patient Stated Goals  "I want to walk again"    Currently in Pain?  No/denies         [facility] PT Assessment - 11/07/19 0001      Assessment   Medical Diagnosis  weakness, h/o falls    Referring Provider (PT)  Adline Mango, MD    Onset Date/Surgical Date  --   progressing over the last several years                  [facility] Adult PT Treatment/Exercise - 11/07/19 0001      Bed Mobility   Bed Mobility  Rolling Right;Rolling Left;Right Sidelying to Sit;Sit to Sidelying Right;Sitting - Scoot to Edge of Bed    Rolling Right  Minimal Assistance - Patient > 75%    Rolling Left  Minimal Assistance - Patient > 75%    Right Sidelying to Sit  Minimal Assistance - Patient > 75%    Sitting - Scoot to Edge of Bed  Minimal Assistance - Patient > 75%   support in sitting   Sit to Sidelying Right  2 Helpers;Minimal Assistance - Patient > 75%      Transfers   Transfers  Sit to Stand    Sit to Stand  4: Min assist   min A + RW   Sit to Stand  Details (indicate cue type and reason)  manual cues to prevent R knee buckling and cues for set up    Number of Reps  --   2     Neuro Re-ed    Neuro Re-ed Details   sitting anterior pelvic tilts x10 in supported sitting; modified sit ups x10 with supported sitting; supported sitting ball squeeze 10x3", supported sitting LAQ x5 each LE       Knee/Hip Exercises: Stretches   Passive Hamstring Stretch  Right;Left;30 seconds;2 reps    Passive Hamstring Stretch Limitations  sitting in w/c with manual assistance to lean forward      Knee/Hip Exercises: Aerobic   Nustep  L5 x7 min (UE/LEs)      Manual Therapy   Manual Therapy  Passive ROM    Passive ROM  R/L HS stretch 2x30", B butterfly stretch 3x30"   intermittent LE muscle spasming            PT Education - 11/07/19 1204    Education Details  discussion on patient's current progress and remaining  impairments and goals; advised patient and caregiver to try to practice transfering into bed at home to assess her tolerance for it    Person(s) Educated  Patient    Methods  Explanation;Demonstration;Tactile cues;Verbal cues    Comprehension  Verbalized understanding;Returned demonstration       PT Short Term Goals - 11/07/19 1111      PT SHORT TERM GOAL #1   Title  Pt will be modified independent in her HEP    Time  4    Period  Weeks    Status  Achieved   reporting compliance until recent exaccerbation of weakness   Target Date  10/10/19      PT SHORT TERM GOAL #2   Title  Pt will be able to tolerate 15 minutes of exercise without rest break.    Time  4    Period  Weeks    Status  Partially Met   reporting 10 min before requiring rest break   Target Date  12/19/19        PT Long Term Goals - 11/07/19 1112      PT LONG TERM GOAL #1   Title  Pt will be able to perform sit to stand with Contact Guard assistance with rolling walker.    Baseline  Mod-max assistance on 08/01/2019    Time  6    Period  Weeks    Status  On-going   requiring min A with RW   Target Date  12/19/19      PT LONG TERM GOAL #2   Title  Pt will be able to perform sit to stand transfer with minimal assistance using LRAD.    Baseline  max assistance on 08/01/2019    Time  8    Period  Weeks    Status  Achieved      PT LONG TERM GOAL #3   Title  Pt will be able to perform supine to sit modified independently.    Baseline  requires varying assistance from caregivers    Time  6    Period  Weeks    Status  On-going   requiring min A with rolling and min A x2 sidelying to sit   Target Date  12/19/19      PT LONG TERM GOAL #4   Title  Patient to demonstrate and recall importance of maintaining more upright sitting posture in W/C  and report ability to maintain 10 min of upright sitting.    Time  6    Period  Weeks    Status  Partially Met   able to sit more comfortably in more upright position  while in w/c with back support; was able to tolerate 10 minutes of upright sitting during ther-ex with intermittent UE support   Target Date  12/19/19      PT LONG TERM GOAL #5   Title  Patient to perform 30 sec of standing with RW and min A.    Time  6    Period  Weeks    Status  New    Target Date  12/19/19            Plan - 11/07/19 1215    Clinical Impression Statement  Patient present with caregiver. Reporting 60% improvement since initial eval. Would like to continue working on LE flexibility, core strength, and standing ability. Patient is noting 10 minutes of continuous exercise at home before having to take a break, noting that her recent COVID-19 vaccine has contributed to some fatigue and weakness. Patient has met one of her STS goals today, as she was able to perform STS transfer with min A and RW. R LE still with tendency to buckle, thus requiring manual assistance at knee to prevent this. Updated goals to reflect patient's request to work on increasing her standing tolerance. Patient was also able to tolerate 10 minutes of sitting ther-ex at edge of mat with intermittent min A. Bed mobility also improving and patient is tolerating supine stretching for longer without onset of pain or discomfort. Patient is demonstrating excellent progress towards goals considering her hx of a progressive neuromuscular disorder. Would continue to benefit from skilled PT services 2x/week for 6 weeks to address goals.    Comorbidities  MS, blind, Osteoporosis, GERD, venous stasis, h/o eye surgery, h/o sepsis with hospitalization, UTI's, Left BBB, Left Ventiricular Hypertrophy    Stability/Clinical Decision Making  Evolving/Moderate complexity    Rehab Potential  Fair    PT Frequency  2x / week    PT Duration  6 weeks    PT Treatment/Interventions  ADLs/Self Care Home Management;Therapeutic activities;Gait training;Functional mobility training;Neuromuscular re-education;Balance training;Therapeutic  exercise;Patient/family education;Wheelchair mobility training;Manual techniques;Passive range of motion;Energy conservation;Cryotherapy;Moist Heat    PT Next Visit Plan  transfers (sit to stand, w/c to mat), LE strengthening, UE strengthening    PT Home Exercise Plan  Access Code: [email or web address] (rows with red therdand, trunk flexion/forward reaching with activation of quads for standing)    Consulted and Agree with Plan of Care  Patient;Family member/caregiver    Family Member Consulted  [place] (CNA)       Patient will benefit from skilled therapeutic intervention in order to improve the following deficits and impairments:  Impaired flexibility, Decreased balance, Decreased activity tolerance, Decreased range of motion, Decreased strength, Postural dysfunction, Decreased mobility, Pain, Improper body mechanics  Visit Diagnosis: Repeated falls  Muscle weakness (generalized)     Problem List Patient Active Problem List   Diagnosis Date Noted  . Elevated troponin 07/13/2018  . LBBB (left bundle branch block) 07/13/2018  . LVH (left ventricular hypertrophy) 07/13/2018  . Coronary artery calcification seen on CAT scan 07/13/2018  . Bacteremia due to Enterococcus 07/02/2018  . E. coli UTI 07/02/2018  . Iron deficiency anemia 07/02/2018  . Hypomagnesemia 07/02/2018  . Hypocalcemia 07/02/2018  . Hypokalemia 07/02/2018  . Coronary artery disease 07/02/2018  . H/O eye surgery  07/02/2018  . Abdominal pain   . Acute lower UTI 06/25/2018  . Sepsis ([place]) 06/25/2018  . Acute cystitis with hematuria   . Venous stasis 11/20/2014  . Lung nodule < 6cm on CT 11/20/2014  . Left lower lobe pneumonia 11/17/2014  . Influenza A (H1N1) 11/06/2012  . Hematemesis 11/06/2012  . CAP (community acquired pneumonia) 11/05/2012  . Hypoxemia 11/05/2012  . SOB (shortness of breath) 11/05/2012  . Multiple sclerosis ([place]) 11/05/2012     Janene Harvey, PT, DPT 11/07/19 12:21 PM   [facility] [address]  [place], [place], [address] Phone: [phone]   Fax:  [phone]  Name: Heather Lutz MRN: [id number] Date of Birth: 1946/02/22

## 2019-11-15 ENCOUNTER — Encounter: Payer: Self-pay | Admitting: Physical Therapy

## 2019-11-15 ENCOUNTER — Ambulatory Visit: Payer: Medicare Other | Attending: Internal Medicine | Admitting: Physical Therapy

## 2019-11-15 ENCOUNTER — Other Ambulatory Visit: Payer: Self-pay

## 2019-11-15 VITALS — BP 130/72 | HR 86

## 2019-11-15 DIAGNOSIS — R296 Repeated falls: Secondary | ICD-10-CM | POA: Diagnosis present

## 2019-11-15 DIAGNOSIS — M6281 Muscle weakness (generalized): Secondary | ICD-10-CM | POA: Diagnosis present

## 2019-11-15 NOTE — Therapy (Signed)
[facility] [address]  [place], [place], [address] Phone: [phone]   Fax:  [phone]  Physical Therapy Treatment  Patient Details  Name: Heather Lutz MRN: [id number] Date of Birth: [birth year] Referring Provider (PT): Adline Mango, MD   Encounter Date: 11/15/2019  PT End of Session - 11/15/19 1600    Visit Number  13    Number of Visits  24    Date for PT Re-Evaluation  12/19/19    Authorization Type  [employer]HC Medicare    PT Start Time  1312    PT Stop Time  1357    PT Time Calculation (min)  45 min    Equipment Utilized During Treatment  Gait belt    Activity Tolerance  Patient tolerated treatment well    Behavior During Therapy  [facility] for tasks assessed/performed       Past Medical History:  Diagnosis Date  . Blindness   . GERD (gastroesophageal reflux disease)   . Multiple sclerosis ([place])   . Osteoporosis     Past Surgical History:  Procedure Laterality Date  . ABDOMINAL HYSTERECTOMY    . CYSTOSCOPY WITH RETROGRADE PYELOGRAM, URETEROSCOPY AND STENT PLACEMENT Left 08/01/2018   Procedure: CYSTOSCOPY WITH RETROGRADE PYELOGRAM, URETEROSCOPY, HOLMIUM LASER AND STENT PLACEMENT;  Surgeon: Lucas Mallow, MD;  Location: [facility];  Service: Urology;  Laterality: Left;  . ESOPHAGOGASTRODUODENOSCOPY N/A 11/06/2012   Procedure: ESOPHAGOGASTRODUODENOSCOPY (EGD);  Surgeon: Lear Ng, MD;  Location: [facility] ENDOSCOPY;  Service: Endoscopy;  Laterality: N/A;    Vitals:   11/15/19 1314  BP: 130/72  Pulse: 86  SpO2: 98%    Subjective Assessment - 11/15/19 1317    Subjective  Not much is new. Denies recent falls. Having some nerve pain in the R leg today.    Pertinent History  MS, blind, Osteoporosis, GERD, venous stasis, h/o eye surgery, h/o sepsis with hospitalization, UTI's, Left BBB, Left Ventiricular Hypertrophy    Patient Stated Goals  "I want to walk again"    Currently in Pain?  Yes    Pain Score   5     Pain Location  Leg    Pain Orientation  Right;Posterior;Anterior    Pain Descriptors / Indicators  --   "nerve pain"   Pain Type  Chronic pain;Neuropathic pain                       [facility] Adult PT Treatment/Exercise - 11/15/19 0001      Bed Mobility   Bed Mobility  Rolling Right;Rolling Left;Right Sidelying to Sit;Sit to Sidelying Right;Sitting - Scoot to Edge of Bed    Rolling Right  Minimal Assistance - Patient > 75%    Rolling Left  Minimal Assistance - Patient > 75%    Right Sidelying to Sit  Minimal Assistance - Patient > 75%    Sitting - Scoot to Edge of Bed  Supervision/Verbal cueing    Sit to Sidelying Right  Minimal Assistance - Patient > 75%      Transfers   Transfers  Sit to Stand    Sit to Stand  4: Min assist   x2   Comments  attemptedn 2x but patient unable to control B knee buckling       Neuro Re-ed    Neuro Re-ed Details   sitting in w/c anterior ttrunk lean against green TB x10; modified sit up with CGA and green TB assistance 10x  Knee/Hip Exercises: Aerobic   Nustep  L5 x7 min (UE/LEs)      Manual Therapy   Manual Therapy  Passive ROM    Passive ROM  R/L HS stretch 2x30" each LE; sidelying hip flexor/quad stretch 2x30" each LE   limited by spasticity               PT Short Term Goals - 11/07/19 1111      PT SHORT TERM GOAL #1   Title  Pt will be modified independent in her HEP    Time  4    Period  Weeks    Status  Achieved   reporting compliance until recent exaccerbation of weakness   Target Date  10/10/19      PT SHORT TERM GOAL #2   Title  Pt will be able to tolerate 15 minutes of exercise without rest break.    Time  4    Period  Weeks    Status  Partially Met   reporting 10 min before requiring rest break   Target Date  12/19/19        PT Long Term Goals - 11/07/19 1112      PT LONG TERM GOAL #1   Title  Pt will be able to perform sit to stand with Contact Guard assistance with rolling walker.     Baseline  Mod-max assistance on 08/01/2019    Time  6    Period  Weeks    Status  On-going   requiring min A with RW   Target Date  12/19/19      PT LONG TERM GOAL #2   Title  Pt will be able to perform sit to stand transfer with minimal assistance using LRAD.    Baseline  max assistance on 08/01/2019    Time  8    Period  Weeks    Status  Achieved      PT LONG TERM GOAL #3   Title  Pt will be able to perform supine to sit modified independently.    Baseline  requires varying assistance from caregivers    Time  6    Period  Weeks    Status  On-going   requiring min A with rolling and min A x2 sidelying to sit   Target Date  12/19/19      PT LONG TERM GOAL #4   Title  Patient to demonstrate and recall importance of maintaining more upright sitting posture in W/C and report ability to maintain 10 min of upright sitting.    Time  6    Period  Weeks    Status  Partially Met   able to sit more comfortably in more upright position while in w/c with back support; was able to tolerate 10 minutes of upright sitting during ther-ex with intermittent UE support   Target Date  12/19/19      PT LONG TERM GOAL #5   Title  Patient to perform 30 sec of standing with RW and min A.    Time  6    Period  Weeks    Status  New    Target Date  12/19/19            Plan - 11/15/19 1600    Clinical Impression Statement  Patient present with caregiver and without new complaints today. Requesting to work on standing and LE stretching today. Attempted to perform STS transfers from w/c with caregiver assistance, however patient demonstrating difficulty  with controlling B knee buckling today. Continued to work on core stability exercises including modified sit up with TB assist. Patient is continuing to demonstrate good improvement in core strength. Also demonstrating improving ability in scooting forward and back in w/c without cueing. Still requiring very little assistance with rolling on mat, as  well as min A with supine<>sit.  Worked on passive LE stretching on mat at end of session for relief of muscle tightness as patient still experiencing muscle spasms and spasticity. Ended session without complaints.    Comorbidities  MS, blind, Osteoporosis, GERD, venous stasis, h/o eye surgery, h/o sepsis with hospitalization, UTI's, Left BBB, Left Ventiricular Hypertrophy    Stability/Clinical Decision Making  Evolving/Moderate complexity    Rehab Potential  Fair    PT Frequency  2x / week    PT Duration  6 weeks    PT Treatment/Interventions  ADLs/Self Care Home Management;Therapeutic activities;Gait training;Functional mobility training;Neuromuscular re-education;Balance training;Therapeutic exercise;Patient/family education;Wheelchair mobility training;Manual techniques;Passive range of motion;Energy conservation;Cryotherapy;Moist Heat    PT Next Visit Plan  transfers (sit to stand, w/c to mat), LE strengthening, UE strengthening    PT Home Exercise Plan  Access Code: [email or web address] (rows with red therdand, trunk flexion/forward reaching with activation of quads for standing)    Consulted and Agree with Plan of Care  Patient;Family member/caregiver    Family Member Consulted  [place] (CNA)       Patient will benefit from skilled therapeutic intervention in order to improve the following deficits and impairments:  Impaired flexibility, Decreased balance, Decreased activity tolerance, Decreased range of motion, Decreased strength, Postural dysfunction, Decreased mobility, Pain, Improper body mechanics  Visit Diagnosis: Repeated falls  Muscle weakness (generalized)     Problem List Patient Active Problem List   Diagnosis Date Noted  . Elevated troponin 07/13/2018  . LBBB (left bundle branch block) 07/13/2018  . LVH (left ventricular hypertrophy) 07/13/2018  . Coronary artery calcification seen on CAT scan 07/13/2018  . Bacteremia due to Enterococcus 07/02/2018  . E. coli UTI 07/02/2018  .  Iron deficiency anemia 07/02/2018  . Hypomagnesemia 07/02/2018  . Hypocalcemia 07/02/2018  . Hypokalemia 07/02/2018  . Coronary artery disease 07/02/2018  . H/O eye surgery 07/02/2018  . Abdominal pain   . Acute lower UTI 06/25/2018  . Sepsis ([place]) 06/25/2018  . Acute cystitis with hematuria   . Venous stasis 11/20/2014  . Lung nodule < 6cm on CT 11/20/2014  . Left lower lobe pneumonia 11/17/2014  . Influenza A (H1N1) 11/06/2012  . Hematemesis 11/06/2012  . CAP (community acquired pneumonia) 11/05/2012  . Hypoxemia 11/05/2012  . SOB (shortness of breath) 11/05/2012  . Multiple sclerosis ([place]) 11/05/2012     Janene Harvey, PT, DPT 11/15/19 4:06 PM   [facility] [address]  [place], [place], [address] Phone: [phone]   Fax:  [phone]  Name: Heather Lutz MRN: [id number] Date of Birth: 1946/06/21

## 2019-11-20 ENCOUNTER — Encounter: Payer: Self-pay | Admitting: Physical Therapy

## 2019-11-20 ENCOUNTER — Ambulatory Visit: Payer: Medicare Other | Admitting: Physical Therapy

## 2019-11-20 ENCOUNTER — Other Ambulatory Visit: Payer: Self-pay

## 2019-11-20 VITALS — BP 130/71 | HR 86

## 2019-11-20 DIAGNOSIS — R296 Repeated falls: Secondary | ICD-10-CM

## 2019-11-20 DIAGNOSIS — M6281 Muscle weakness (generalized): Secondary | ICD-10-CM

## 2019-11-20 NOTE — Therapy (Signed)
[facility] [address]  [place], [place], [address] Phone: [phone]   Fax:  [phone]  Physical Therapy Treatment  Patient Details  Name: Heather Lutz MRN: [id number] Date of Birth: 08/06/1946 Referring Provider (PT): Adline Mango, MD   Encounter Date: 11/20/2019  PT End of Session - 11/20/19 1424    Visit Number  14    Number of Visits  24    Date for PT Re-Evaluation  12/19/19    Authorization Type  [employer] Medicare    PT Start Time  1308    PT Stop Time  1357    PT Time Calculation (min)  49 min    Equipment Utilized During Treatment  Gait belt    Activity Tolerance  Patient tolerated treatment well;Patient limited by pain    Behavior During Therapy  [facility] for tasks assessed/performed       Past Medical History:  Diagnosis Date  . Blindness   . GERD (gastroesophageal reflux disease)   . Multiple sclerosis ([place])   . Osteoporosis     Past Surgical History:  Procedure Laterality Date  . ABDOMINAL HYSTERECTOMY    . CYSTOSCOPY WITH RETROGRADE PYELOGRAM, URETEROSCOPY AND STENT PLACEMENT Left 08/01/2018   Procedure: CYSTOSCOPY WITH RETROGRADE PYELOGRAM, URETEROSCOPY, HOLMIUM LASER AND STENT PLACEMENT;  Surgeon: Lucas Mallow, MD;  Location: [facility];  Service: Urology;  Laterality: Left;  . ESOPHAGOGASTRODUODENOSCOPY N/A 11/06/2012   Procedure: ESOPHAGOGASTRODUODENOSCOPY (EGD);  Surgeon: Lear Ng, MD;  Location: [facility] ENDOSCOPY;  Service: Endoscopy;  Laterality: N/A;    Vitals:   11/20/19 1311  BP: 130/71  Pulse: 86  SpO2: 99%    Subjective Assessment - 11/20/19 1315    Subjective  Nothing new to speaj of today, Denies falls.    Pertinent History  MS, blind, Osteoporosis, GERD, venous stasis, h/o eye surgery, h/o sepsis with hospitalization, UTI's, Left BBB, Left Ventiricular Hypertrophy    Patient Stated Goals  "I want to walk again"    Currently in Pain?  Yes    Pain Score  4     Pain Location  Leg    Pain Orientation  Right;Anterior;Posterior    Pain Descriptors / Indicators  --   "nerve pain"   Pain Type  Chronic pain;Neuropathic pain                       [facility] Adult PT Treatment/Exercise - 11/20/19 0001      Bed Mobility   Bed Mobility  Rolling Right;Rolling Left;Right Sidelying to Sit;Sit to Sidelying Right;Sitting - Scoot to Edge of Bed    Rolling Right  Minimal Assistance - Patient > 75%    Rolling Left  Minimal Assistance - Patient > 75%    Right Sidelying to Sit  Minimal Assistance - Patient > 75%    Sitting - Scoot to Edge of Bed  Supervision/Verbal cueing    Sit to Sidelying Right  Minimal Assistance - Patient > 75%;Moderate Assistance - Patient 50-74%      Transfers   Transfers  Sit to Stand    Sit to Stand  3: Mod assist   x2   Number of Reps  --   2   Transfer Cueing  manual cues to prevent buckling on R LE and verbal cues for proper set up and anterior chest lean      Neuro Re-ed    Neuro Re-ed Details   sitting with R/L  weight shift onto forearms + pushing back up 2x5 each side; supported sit up x10; supported sitting with anterior pelvic tilt + scapular retraction 10x3"       Knee/Hip Exercises: Aerobic   Nustep  L5 x7 min (UE/LEs)      Knee/Hip Exercises: Supine   Short Arc Quad Sets  Strengthening;Right;Left;1 set;10 reps    Short Arc Quad Sets Limitations  foam roll under knees   tactile and verbal cues for proper muscle activation    Other Supine Knee/Hip Exercises  supine clam with yellow TB x10   very limited ROM on R, better on L   Other Supine Knee/Hip Exercises  hooklying paloff press with yellow TB x10 each side   assistance on R LE to maintain hooklying     Manual Therapy   Manual Therapy  Passive ROM    Passive ROM  B butterfly stretch in hooklying 2x30"               PT Short Term Goals - 11/07/19 1111      PT SHORT TERM GOAL #1   Title  Pt will be modified independent in her HEP    Time   4    Period  Weeks    Status  Achieved   reporting compliance until recent exaccerbation of weakness   Target Date  10/10/19      PT SHORT TERM GOAL #2   Title  Pt will be able to tolerate 15 minutes of exercise without rest break.    Time  4    Period  Weeks    Status  Partially Met   reporting 10 min before requiring rest break   Target Date  12/19/19        PT Long Term Goals - 11/20/19 1428      PT LONG TERM GOAL #1   Title  Pt will be able to perform sit to stand with Contact Guard assistance with rolling walker.    Baseline  Mod-max assistance on 08/01/2019    Time  6    Period  Weeks    Status  On-going   requiring min A with RW     PT LONG TERM GOAL #2   Title  Pt will be able to perform sit to stand transfer with minimal assistance using LRAD.    Baseline  max assistance on 08/01/2019    Time  8    Period  Weeks    Status  Achieved      PT LONG TERM GOAL #3   Title  Pt will be able to perform supine to sit modified independently.    Baseline  requires varying assistance from caregivers    Time  6    Period  Weeks    Status  On-going   requiring min A with rolling and min A x2 sidelying to sit     PT LONG TERM GOAL #4   Title  Patient to demonstrate and recall importance of maintaining more upright sitting posture in W/C and report ability to maintain 10 min of upright sitting.    Time  6    Period  Weeks    Status  Partially Met   able to sit more comfortably in more upright position while in w/c with back support; was able to tolerate 10 minutes of upright sitting during ther-ex with intermittent UE support     PT LONG TERM GOAL #5   Title  Patient to perform 30  sec of standing with RW and min A.    Time  6    Period  Weeks    Status  On-going            Plan - 11/20/19 1424    Clinical Impression Statement  Patient present with caregiver. Denies recent falls. Caregiver, Jasmine, reporting more difficulty transferring patient in the past week  d/t LE weakness and buckling. Worked on STS transfers with RW support with patient demonstrating good ability to stand upright but requiring manual blocking at R LE to avoid buckling. Demonstrated fatigue and inability to stand fully upright on 2nd rep. Continued with sitting balance and core stability work with good effort and performance. Initiated supine anti-rotation core strength and quad strengthening. Patient demonstrated difficulty extending R knee into SAQ, but still with palpable contraction requiring minimal assistance. Ended session without complaints. Patient progressing well towards goals.    Comorbidities  MS, blind, Osteoporosis, GERD, venous stasis, h/o eye surgery, h/o sepsis with hospitalization, UTI's, Left BBB, Left Ventiricular Hypertrophy    Stability/Clinical Decision Making  Evolving/Moderate complexity    Rehab Potential  Fair    PT Frequency  2x / week    PT Duration  6 weeks    PT Treatment/Interventions  ADLs/Self Care Home Management;Therapeutic activities;Gait training;Functional mobility training;Neuromuscular re-education;Balance training;Therapeutic exercise;Patient/family education;Wheelchair mobility training;Manual techniques;Passive range of motion;Energy conservation;Cryotherapy;Moist Heat    PT Next Visit Plan  transfers (sit to stand, w/c to mat), LE strengthening, UE strengthening    PT Home Exercise Plan  Access Code: [email or web address] (rows with red therdand, trunk flexion/forward reaching with activation of quads for standing)    Consulted and Agree with Plan of Care  Patient;Family member/caregiver    Family Member Consulted  [place] (CNA)       Patient will benefit from skilled therapeutic intervention in order to improve the following deficits and impairments:  Impaired flexibility, Decreased balance, Decreased activity tolerance, Decreased range of motion, Decreased strength, Postural dysfunction, Decreased mobility, Pain, Improper body mechanics  Visit  Diagnosis: Repeated falls  Muscle weakness (generalized)     Problem List Patient Active Problem List   Diagnosis Date Noted  . Elevated troponin 07/13/2018  . LBBB (left bundle branch block) 07/13/2018  . LVH (left ventricular hypertrophy) 07/13/2018  . Coronary artery calcification seen on CAT scan 07/13/2018  . Bacteremia due to Enterococcus 07/02/2018  . E. coli UTI 07/02/2018  . Iron deficiency anemia 07/02/2018  . Hypomagnesemia 07/02/2018  . Hypocalcemia 07/02/2018  . Hypokalemia 07/02/2018  . Coronary artery disease 07/02/2018  . H/O eye surgery 07/02/2018  . Abdominal pain   . Acute lower UTI 06/25/2018  . Sepsis ([place]) 06/25/2018  . Acute cystitis with hematuria   . Venous stasis 11/20/2014  . Lung nodule < 6cm on CT 11/20/2014  . Left lower lobe pneumonia 11/17/2014  . Influenza A (H1N1) 11/06/2012  . Hematemesis 11/06/2012  . CAP (community acquired pneumonia) 11/05/2012  . Hypoxemia 11/05/2012  . SOB (shortness of breath) 11/05/2012  . Multiple sclerosis ([place]) 11/05/2012     Janene Harvey, PT, DPT 11/20/19 2:29 PM   [facility] [address]  [place], [place], [address] Phone: [phone]   Fax:  [phone]  Name: Heather Lutz MRN: [id number] Date of Birth: 11/26/1945

## 2019-11-21 ENCOUNTER — Ambulatory Visit: Payer: Medicare Other | Admitting: Physical Therapy

## 2019-11-21 ENCOUNTER — Other Ambulatory Visit: Payer: Self-pay

## 2019-11-21 ENCOUNTER — Encounter: Payer: Self-pay | Admitting: Physical Therapy

## 2019-11-21 VITALS — BP 143/63 | HR 73

## 2019-11-21 DIAGNOSIS — R296 Repeated falls: Secondary | ICD-10-CM | POA: Diagnosis not present

## 2019-11-21 DIAGNOSIS — M6281 Muscle weakness (generalized): Secondary | ICD-10-CM

## 2019-11-21 NOTE — Therapy (Signed)
[facility] [address]  [place], [place], [address] Phone: [phone]   Fax:  [phone]  Physical Therapy Treatment  Patient Details  Name: Heather Lutz MRN: [id number] Date of Birth: 1945-12-26 Referring Provider (PT): Adline Mango, MD   Encounter Date: 11/21/2019  PT End of Session - 11/21/19 1544    Visit Number  15    Number of Visits  24    Date for PT Re-Evaluation  12/19/19    Authorization Type  [employer]HC Medicare    PT Start Time  1309    PT Stop Time  1400    PT Time Calculation (min)  51 min    Equipment Utilized During Treatment  Gait belt    Activity Tolerance  Patient tolerated treatment well    Behavior During Therapy  [facility] for tasks assessed/performed       Past Medical History:  Diagnosis Date  . Blindness   . GERD (gastroesophageal reflux disease)   . Multiple sclerosis ([place])   . Osteoporosis     Past Surgical History:  Procedure Laterality Date  . ABDOMINAL HYSTERECTOMY    . CYSTOSCOPY WITH RETROGRADE PYELOGRAM, URETEROSCOPY AND STENT PLACEMENT Left 08/01/2018   Procedure: CYSTOSCOPY WITH RETROGRADE PYELOGRAM, URETEROSCOPY, HOLMIUM LASER AND STENT PLACEMENT;  Surgeon: Lucas Mallow, MD;  Location: [facility];  Service: Urology;  Laterality: Left;  . ESOPHAGOGASTRODUODENOSCOPY N/A 11/06/2012   Procedure: ESOPHAGOGASTRODUODENOSCOPY (EGD);  Surgeon: Lear Ng, MD;  Location: [facility];  Service: Endoscopy;  Laterality: N/A;    Vitals:   11/21/19 1311  BP: (!) 143/63  Pulse: 73  SpO2: 100%    Subjective Assessment - 11/21/19 1312    Subjective  Notes that was was told a couple weeks ago that her hemoglobin is low. Has noticed some weakness and attibutes it to these labs and receiving her 1st vaccine.    Pertinent History  MS, blind, Osteoporosis, GERD, venous stasis, h/o eye surgery, h/o sepsis with hospitalization, UTI's, Left BBB, Left Ventiricular Hypertrophy    Patient Stated Goals  "I want to walk again"    Currently in Pain?  No/denies                       [facility] Adult PT Treatment/Exercise - 11/21/19 0001      Bed Mobility   Bed Mobility  Rolling Right;Rolling Left;Right Sidelying to Sit;Sit to Sidelying Right;Sitting - Scoot to Edge of Bed    Rolling Right  Contact Guard/Touching assist    Rolling Left  Contact Guard/Touching assist    Right Sidelying to Sit  Minimal Assistance - Patient > 75%    Sitting - Scoot to Edge of Bed  Set up assist    Sit to Sidelying Right  Minimal Assistance - Patient > 75%      Transfers   Transfers  Squat Pivot Transfers    Sit to Stand  3: Mod assist;4: Min assist    Number of Reps  --   4   Comments  transferring to/from w/c and nustep and w/c and mat with mod A x1 and min A x1       Neuro Re-ed    Neuro Re-ed Details   modified sit up with white wedge behind x5 without assistance; R/L forearm weight shift in unsupported sitting; sitting pelvic tilts x10 unsupported      Knee/Hip Exercises: Aerobic   Nustep  L5 x7 min (UE/LEs)  Knee/Hip Exercises: Seated   Long Arc Quad  Strengthening;Right;Left;1 set;10 reps    Long Arc Quad Limitations  10x L LE, 2x5 R LE      Knee/Hip Exercises: Supine   Other Supine Knee/Hip Exercises  R/L SKTC stretch 30" with strap and minor PT assist      Shoulder Exercises: Seated   Row  Strengthening;Both;5 reps;Theraband    Theraband Level (Shoulder Row)  Level 1 (Yellow)    Row Limitations  2x10 in unsupported sitting      Manual Therapy   Manual Therapy  Passive ROM    Passive ROM  R/L figure 4 and HS stretch 30" each               PT Short Term Goals - 11/07/19 1111      PT SHORT TERM GOAL #1   Title  Pt will be modified independent in her HEP    Time  4    Period  Weeks    Status  Achieved   reporting compliance until recent exaccerbation of weakness   Target Date  10/10/19      PT SHORT TERM GOAL #2   Title  Pt will be  able to tolerate 15 minutes of exercise without rest break.    Time  4    Period  Weeks    Status  Partially Met   reporting 10 min before requiring rest break   Target Date  12/19/19        PT Long Term Goals - 11/21/19 1549      PT LONG TERM GOAL #1   Title  Pt will be able to perform sit to stand with Contact Guard assistance with rolling walker.    Baseline  Mod-max assistance on 08/01/2019    Time  6    Period  Weeks    Status  On-going   requiring min A with RW     PT LONG TERM GOAL #2   Title  Pt will be able to perform sit to stand transfer with minimal assistance using LRAD.    Baseline  max assistance on 08/01/2019    Time  8    Period  Weeks    Status  Achieved      PT LONG TERM GOAL #3   Title  Pt will be able to perform supine to sit modified independently.    Baseline  requires varying assistance from caregivers    Time  6    Period  Weeks    Status  On-going   requiring min A with rolling and min A x2 sidelying to sit     PT LONG TERM GOAL #4   Title  Patient to demonstrate and recall importance of maintaining more upright sitting posture in W/C and report ability to maintain 10 min of upright sitting.    Time  6    Period  Weeks    Status  Achieved   tolerated 15 min of unsupported sitting on 11/21/19     PT LONG TERM GOAL #5   Title  Patient to perform 30 sec of standing with RW and min A.    Time  6    Period  Weeks    Status  On-going            Plan - 11/21/19 1544    Clinical Impression Statement  Patient noting that she was told she has low hemoglobin levels, and attributes her recent weakness and fatigue  to these labs as well as receiving her 1st COVID-19 vaccine recently. Worked on mat ther-ex with patient in unsupported sitting today with excellent ability to activate core and maintain sitting balance. Patient tolerated at least 15 minutes of unsupported sitting today without fatigue. Worked on supine LE stretching to patient's  tolerance with good HS flexibility, but more difficulty stretching into figure 4 stretch. Patient tolerated all exercises well today. No complaints at end of session.    Comorbidities  MS, blind, Osteoporosis, GERD, venous stasis, h/o eye surgery, h/o sepsis with hospitalization, UTI's, Left BBB, Left Ventiricular Hypertrophy    Stability/Clinical Decision Making  Evolving/Moderate complexity    Rehab Potential  Fair    PT Frequency  2x / week    PT Duration  6 weeks    PT Treatment/Interventions  ADLs/Self Care Home Management;Therapeutic activities;Gait training;Functional mobility training;Neuromuscular re-education;Balance training;Therapeutic exercise;Patient/family education;Wheelchair mobility training;Manual techniques;Passive range of motion;Energy conservation;Cryotherapy;Moist Heat    PT Next Visit Plan  transfers (sit to stand, w/c to mat), LE strengthening, UE strengthening    PT Home Exercise Plan  Access Code: [email or web address] (rows with red therdand, trunk flexion/forward reaching with activation of quads for standing)    Consulted and Agree with Plan of Care  Patient;Family member/caregiver    Family Member Consulted  [place] (CNA)       Patient will benefit from skilled therapeutic intervention in order to improve the following deficits and impairments:  Impaired flexibility, Decreased balance, Decreased activity tolerance, Decreased range of motion, Decreased strength, Postural dysfunction, Decreased mobility, Pain, Improper body mechanics  Visit Diagnosis: Repeated falls  Muscle weakness (generalized)     Problem List Patient Active Problem List   Diagnosis Date Noted  . Elevated troponin 07/13/2018  . LBBB (left bundle branch block) 07/13/2018  . LVH (left ventricular hypertrophy) 07/13/2018  . Coronary artery calcification seen on CAT scan 07/13/2018  . Bacteremia due to Enterococcus 07/02/2018  . E. coli UTI 07/02/2018  . Iron deficiency anemia 07/02/2018  .  Hypomagnesemia 07/02/2018  . Hypocalcemia 07/02/2018  . Hypokalemia 07/02/2018  . Coronary artery disease 07/02/2018  . H/O eye surgery 07/02/2018  . Abdominal pain   . Acute lower UTI 06/25/2018  . Sepsis ([place]) 06/25/2018  . Acute cystitis with hematuria   . Venous stasis 11/20/2014  . Lung nodule < 6cm on CT 11/20/2014  . Left lower lobe pneumonia 11/17/2014  . Influenza A (H1N1) 11/06/2012  . Hematemesis 11/06/2012  . CAP (community acquired pneumonia) 11/05/2012  . Hypoxemia 11/05/2012  . SOB (shortness of breath) 11/05/2012  . Multiple sclerosis ([place]) 11/05/2012    Janene Harvey, PT, DPT 11/21/19 3:51 PM   [facility] [address]  [place], [place], [address] Phone: [phone]   Fax:  [phone]  Name: Heather Lutz MRN: [id number] Date of Birth: 03-Mar-1946

## 2019-11-29 ENCOUNTER — Encounter: Payer: Medicare Other | Admitting: Physical Therapy

## 2019-11-30 ENCOUNTER — Ambulatory Visit: Payer: Medicare Other | Admitting: Physical Therapy

## 2019-12-01 ENCOUNTER — Ambulatory Visit: Payer: Medicare Other | Admitting: Physical Therapy

## 2019-12-05 ENCOUNTER — Encounter: Payer: Self-pay | Admitting: Physical Therapy

## 2019-12-05 ENCOUNTER — Ambulatory Visit: Payer: Medicare Other | Admitting: Physical Therapy

## 2019-12-05 ENCOUNTER — Other Ambulatory Visit: Payer: Self-pay

## 2019-12-05 VITALS — BP 124/70 | HR 76

## 2019-12-05 DIAGNOSIS — M6281 Muscle weakness (generalized): Secondary | ICD-10-CM

## 2019-12-05 DIAGNOSIS — R296 Repeated falls: Secondary | ICD-10-CM | POA: Diagnosis not present

## 2019-12-05 NOTE — Therapy (Signed)
[facility] [address]  [place], [place], [address] Phone: [phone]   Fax:  [phone]  Physical Therapy Treatment  Patient Details  Name: Heather Lutz MRN: [id number] Date of Birth: 1945/11/07 Referring Provider (PT): Adline Mango, MD   Encounter Date: 12/05/2019  PT End of Session - 12/05/19 1159    Visit Number  16    Number of Visits  24    Date for PT Re-Evaluation  12/19/19    Authorization Type  [employer] Medicare    PT Start Time  1025   pt late   PT Stop Time  1100    PT Time Calculation (min)  35 min    Equipment Utilized During Treatment  Gait belt    Activity Tolerance  Patient tolerated treatment well    Behavior During Therapy  WFL for tasks assessed/performed       Past Medical History:  Diagnosis Date  . Blindness   . GERD (gastroesophageal reflux disease)   . Multiple sclerosis ([place])   . Osteoporosis     Past Surgical History:  Procedure Laterality Date  . ABDOMINAL HYSTERECTOMY    . CYSTOSCOPY WITH RETROGRADE PYELOGRAM, URETEROSCOPY AND STENT PLACEMENT Left 08/01/2018   Procedure: CYSTOSCOPY WITH RETROGRADE PYELOGRAM, URETEROSCOPY, HOLMIUM LASER AND STENT PLACEMENT;  Surgeon: Lucas Mallow, MD;  Location: [facility];  Service: Urology;  Laterality: Left;  . ESOPHAGOGASTRODUODENOSCOPY N/A 11/06/2012   Procedure: ESOPHAGOGASTRODUODENOSCOPY (EGD);  Surgeon: Lear Ng, MD;  Location: [facility];  Service: Endoscopy;  Laterality: N/A;    Vitals:   12/05/19 1029  BP: 124/70  Pulse: 76  SpO2: 97%    Subjective Assessment - 12/05/19 1027    Subjective  Was unable to get her colonoscopy yesterday d/t unable to do the prep for it. Feeling good since her 2nd dose of the vaccine.    Pertinent History  MS, blind, Osteoporosis, GERD, venous stasis, h/o eye surgery, h/o sepsis with hospitalization, UTI's, Left BBB, Left Ventiricular Hypertrophy    Patient Stated Goals  "I want  to walk again"    Currently in Pain?  No/denies                       [facility] Adult PT Treatment/Exercise - 12/05/19 0001      Transfers   Transfers  Squat Pivot Transfers    Sit to Stand  3: Mod assist    Comments  x2 to/from mat and w/c      Neuro Re-ed    Neuro Re-ed Details   modified sit up with white wedge behind x10;       Knee/Hip Exercises: Stretches   Passive Hamstring Stretch  Right;Left;30 seconds;1 rep    Passive Hamstring Stretch Limitations  unsupported with foot on stool    Gastroc Stretch  Right;Left;30 seconds;2 reps    Gastroc Stretch Limitations  unsupported sitting with strap and foot on stool      Knee/Hip Exercises: Seated   Clamshell with TheraBand  --   sitting B clam with AAROM x5   Other Seated Knee/Hip Exercises  B heel/toe raise x5 each    difficulty and very minimal ROM     Shoulder Exercises: Seated   Horizontal ABduction  Strengthening;Both;10 reps;Theraband    Theraband Level (Shoulder Horizontal ABduction)  Level 1 (Yellow)    Horizontal ABduction Limitations  manual cues for anterior pelvic tilt    External Rotation  Strengthening;Both;10  reps;Theraband    Theraband Level (Shoulder External Rotation)  Level 1 (Yellow)    External Rotation Limitations  manual cues for anterior pelvic tilt      Manual Therapy   Manual Therapy  Passive ROM    Passive ROM  R/L figure 4  2x30" each             PT Education - 12/05/19 1159    Education Details  advised patient and caregiver to practice transfering in/out of bed to assess comfort    Person(s) Educated  Patient;Caregiver(s)   Jasmine   Methods  Explanation    Comprehension  Verbalized understanding       PT Short Term Goals - 11/07/19 1111      PT SHORT TERM GOAL #1   Title  Pt will be modified independent in her HEP    Time  4    Period  Weeks    Status  Achieved   reporting compliance until recent exaccerbation of weakness   Target Date  10/10/19      PT SHORT  TERM GOAL #2   Title  Pt will be able to tolerate 15 minutes of exercise without rest break.    Time  4    Period  Weeks    Status  Partially Met   reporting 10 min before requiring rest break   Target Date  12/19/19        PT Long Term Goals - 11/21/19 1549      PT LONG TERM GOAL #1   Title  Pt will be able to perform sit to stand with Contact Guard assistance with rolling walker.    Baseline  Mod-max assistance on 08/01/2019    Time  6    Period  Weeks    Status  On-going   requiring min A with RW     PT LONG TERM GOAL #2   Title  Pt will be able to perform sit to stand transfer with minimal assistance using LRAD.    Baseline  max assistance on 08/01/2019    Time  8    Period  Weeks    Status  Achieved      PT LONG TERM GOAL #3   Title  Pt will be able to perform supine to sit modified independently.    Baseline  requires varying assistance from caregivers    Time  6    Period  Weeks    Status  On-going   requiring min A with rolling and min A x2 sidelying to sit     PT LONG TERM GOAL #4   Title  Patient to demonstrate and recall importance of maintaining more upright sitting posture in W/C and report ability to maintain 10 min of upright sitting.    Time  6    Period  Weeks    Status  Achieved   tolerated 15 min of unsupported sitting on 11/21/19     PT LONG TERM GOAL #5   Title  Patient to perform 30 sec of standing with RW and min A.    Time  6    Period  Weeks    Status  On-going            Plan - 12/05/19 1200    Clinical Impression Statement  Patient present with caregiver. Noting no new complaints today. Worked on dynamic sitting balance today by having patient perform all UE and LE strengthening exercises in unsupported sitting. Patient tolerated  duration of exercise in unsupported sitting without fatigue or complaints. Did require frequent verbal and manual cues to encourage anterior pelvic tilt and open chest as patient still with tendency to sit  with back in very slouched position. Ended session with passive and self-stretching as patient with difficulty performing sitting clams d/t tightness in hips. Advised patient and caregiver to have patient try transferring in/out of bed at home to assess comfort. Both agreeable. No complaints at end of session.    Comorbidities  MS, blind, Osteoporosis, GERD, venous stasis, h/o eye surgery, h/o sepsis with hospitalization, UTI's, Left BBB, Left Ventiricular Hypertrophy    Stability/Clinical Decision Making  Evolving/Moderate complexity    Rehab Potential  Fair    PT Frequency  2x / week    PT Duration  6 weeks    PT Treatment/Interventions  ADLs/Self Care Home Management;Therapeutic activities;Gait training;Functional mobility training;Neuromuscular re-education;Balance training;Therapeutic exercise;Patient/family education;Wheelchair mobility training;Manual techniques;Passive range of motion;Energy conservation;Cryotherapy;Moist Heat    PT Next Visit Plan  transfers (sit to stand, w/c to mat), LE strengthening, UE strengthening    PT Home Exercise Plan  Access Code: [email or web address] (rows with red therdand, trunk flexion/forward reaching with activation of quads for standing)    Consulted and Agree with Plan of Care  Patient;Family member/caregiver    Family Member Consulted  [place] (CNA)       Patient will benefit from skilled therapeutic intervention in order to improve the following deficits and impairments:  Impaired flexibility, Decreased balance, Decreased activity tolerance, Decreased range of motion, Decreased strength, Postural dysfunction, Decreased mobility, Pain, Improper body mechanics  Visit Diagnosis: Repeated falls  Muscle weakness (generalized)     Problem List Patient Active Problem List   Diagnosis Date Noted  . Elevated troponin 07/13/2018  . LBBB (left bundle branch block) 07/13/2018  . LVH (left ventricular hypertrophy) 07/13/2018  . Coronary artery calcification seen on  CAT scan 07/13/2018  . Bacteremia due to Enterococcus 07/02/2018  . E. coli UTI 07/02/2018  . Iron deficiency anemia 07/02/2018  . Hypomagnesemia 07/02/2018  . Hypocalcemia 07/02/2018  . Hypokalemia 07/02/2018  . Coronary artery disease 07/02/2018  . H/O eye surgery 07/02/2018  . Abdominal pain   . Acute lower UTI 06/25/2018  . Sepsis ([place]) 06/25/2018  . Acute cystitis with hematuria   . Venous stasis 11/20/2014  . Lung nodule < 6cm on CT 11/20/2014  . Left lower lobe pneumonia 11/17/2014  . Influenza A (H1N1) 11/06/2012  . Hematemesis 11/06/2012  . CAP (community acquired pneumonia) 11/05/2012  . Hypoxemia 11/05/2012  . SOB (shortness of breath) 11/05/2012  . Multiple sclerosis ([place]) 11/05/2012     Janene Harvey, PT, DPT 12/05/19 12:05 PM   [facility] [address]  [place], [place], [address] Phone: [phone]   Fax:  [phone]  Name: Heather Lutz MRN: [id number] Date of Birth: [birth year]

## 2019-12-07 ENCOUNTER — Ambulatory Visit: Payer: Medicare Other | Admitting: Physical Therapy

## 2019-12-07 ENCOUNTER — Other Ambulatory Visit: Payer: Self-pay

## 2019-12-07 ENCOUNTER — Encounter: Payer: Self-pay | Admitting: Physical Therapy

## 2019-12-07 VITALS — BP 110/70 | HR 66

## 2019-12-07 DIAGNOSIS — R296 Repeated falls: Secondary | ICD-10-CM

## 2019-12-07 DIAGNOSIS — M6281 Muscle weakness (generalized): Secondary | ICD-10-CM

## 2019-12-07 NOTE — Therapy (Signed)
[facility] [address]  [place], [place], [address] Phone: [phone]   Fax:  [phone]  Physical Therapy Treatment  Patient Details  Name: Heather Lutz MRN: [id number] Date of Birth: 10/29/1945 Referring Provider (PT): Adline Mango, MD   Encounter Date: 12/07/2019  PT End of Session - 12/07/19 1158    Visit Number  17    Number of Visits  24    Date for PT Re-Evaluation  12/19/19    Authorization Type  UHC Medicare    PT Start Time  1102    PT Stop Time  1150    PT Time Calculation (min)  48 min    Equipment Utilized During Treatment  Gait belt    Activity Tolerance  Patient tolerated treatment well    Behavior During Therapy  [facility] for tasks assessed/performed       Past Medical History:  Diagnosis Date  . Blindness   . GERD (gastroesophageal reflux disease)   . Multiple sclerosis ([place])   . Osteoporosis     Past Surgical History:  Procedure Laterality Date  . ABDOMINAL HYSTERECTOMY    . CYSTOSCOPY WITH RETROGRADE PYELOGRAM, URETEROSCOPY AND STENT PLACEMENT Left 08/01/2018   Procedure: CYSTOSCOPY WITH RETROGRADE PYELOGRAM, URETEROSCOPY, HOLMIUM LASER AND STENT PLACEMENT;  Surgeon: Lucas Mallow, MD;  Location: [facility];  Service: Urology;  Laterality: Left;  . ESOPHAGOGASTRODUODENOSCOPY N/A 11/06/2012   Procedure: ESOPHAGOGASTRODUODENOSCOPY (EGD);  Surgeon: Lear Ng, MD;  Location: [facility];  Service: Endoscopy;  Laterality: N/A;    Vitals:   12/07/19 1104  BP: 110/70  Pulse: 66  SpO2: 96%    Subjective Assessment - 12/07/19 1111    Subjective  Doing well. No complaints.Has not tried transfering into her bed yet.    Pertinent History  MS, blind, Osteoporosis, GERD, venous stasis, h/o eye surgery, h/o sepsis with hospitalization, UTI's, Left BBB, Left Ventiricular Hypertrophy    Patient Stated Goals  "I want to walk again"    Currently in Pain?  Yes    Pain Score  4     Pain Location  Leg    Pain Orientation  Right;Anterior;Posterior    Pain Descriptors / Indicators  --   nerve pain   Pain Type  Chronic pain;Neuropathic pain    Pain Radiating Towards  down to foot                       [facility] Adult PT Treatment/Exercise - 12/07/19 0001      Bed Mobility   Bed Mobility  Rolling Right;Rolling Left;Right Sidelying to Sit;Sit to Sidelying Right;Sitting - Scoot to Edge of Bed    Rolling Right  Minimal Assistance - Patient > 75%    Rolling Left  Contact Guard/Touching assist    Right Sidelying to Sit  Minimal Assistance - Patient > 75%    Sitting - Scoot to Edge of Bed  Set up assist    Sit to Sidelying Right  Minimal Assistance - Patient > 75%      Transfers   Transfers  Squat Pivot Transfers    Sit to Stand  3: Mod assist    Comments  x2 to/from mat and w/c and nustep and w/c      Neuro Re-ed    Neuro Re-ed Details   modified sit up with yellow medball at chest + white wedge behind x10;  prayer stretch with green pball anterior/R/L  for  movement out of BOS    manual cues and assistance for proper set up     Knee/Hip Exercises: Aerobic   Nustep  L5 x7 min (UE/LEs)      Manual Therapy   Manual Therapy  Passive ROM    Passive ROM  B butterfly stretch 3x1 min, R/L HS stretch 1 min               PT Short Term Goals - 11/07/19 1111      PT SHORT TERM GOAL #1   Title  Pt will be modified independent in her HEP    Time  4    Period  Weeks    Status  Achieved   reporting compliance until recent exaccerbation of weakness   Target Date  10/10/19      PT SHORT TERM GOAL #2   Title  Pt will be able to tolerate 15 minutes of exercise without rest break.    Time  4    Period  Weeks    Status  Partially Met   reporting 10 min before requiring rest break   Target Date  12/19/19        PT Long Term Goals - 11/21/19 1549      PT LONG TERM GOAL #1   Title  Pt will be able to perform sit to stand with Contact Guard  assistance with rolling walker.    Baseline  Mod-max assistance on 08/01/2019    Time  6    Period  Weeks    Status  On-going   requiring min A with RW     PT LONG TERM GOAL #2   Title  Pt will be able to perform sit to stand transfer with minimal assistance using LRAD.    Baseline  max assistance on 08/01/2019    Time  8    Period  Weeks    Status  Achieved      PT LONG TERM GOAL #3   Title  Pt will be able to perform supine to sit modified independently.    Baseline  requires varying assistance from caregivers    Time  6    Period  Weeks    Status  On-going   requiring min A with rolling and min A x2 sidelying to sit     PT LONG TERM GOAL #4   Title  Patient to demonstrate and recall importance of maintaining more upright sitting posture in W/C and report ability to maintain 10 min of upright sitting.    Time  6    Period  Weeks    Status  Achieved   tolerated 15 min of unsupported sitting on 11/21/19     PT LONG TERM GOAL #5   Title  Patient to perform 30 sec of standing with RW and min A.    Time  6    Period  Weeks    Status  On-going            Plan - 12/07/19 1158    Clinical Impression Statement  Patient reporting no new complaints. Admits to not yet trying to transfer into her bed at home with the help of her caregiver d/t not having her at home with her yesterday. Worked on increasing weighted resistance with core stability exercises. Patient able to perform modified sit up with addition of medball with very slight assistance to maintain upright sitting. Initiated dynamic sitting balance challenge by having patient reach out of her  BOS which seemed challenging to the patient in all directions. Ended session with supine LE stretching as patient with very tight adductors, contributing to immobility. Patient did demonstrate improvement in ability to transfer to sitting from R sidelying today, only needing assistance at LEs. No complaints at end of session. Patient  progressing well.    Comorbidities  MS, blind, Osteoporosis, GERD, venous stasis, h/o eye surgery, h/o sepsis with hospitalization, UTI's, Left BBB, Left Ventiricular Hypertrophy    Stability/Clinical Decision Making  Evolving/Moderate complexity    Rehab Potential  Fair    PT Frequency  2x / week    PT Duration  6 weeks    PT Treatment/Interventions  ADLs/Self Care Home Management;Therapeutic activities;Gait training;Functional mobility training;Neuromuscular re-education;Balance training;Therapeutic exercise;Patient/family education;Wheelchair mobility training;Manual techniques;Passive range of motion;Energy conservation;Cryotherapy;Moist Heat    PT Next Visit Plan  transfers (sit to stand, w/c to mat), LE strengthening, UE strengthening    PT Home Exercise Plan  Access Code: [email or web address] (rows with red therdand, trunk flexion/forward reaching with activation of quads for standing)    Consulted and Agree with Plan of Care  Patient;Family member/caregiver    Family Member Consulted  [place] (CNA)       Patient will benefit from skilled therapeutic intervention in order to improve the following deficits and impairments:  Impaired flexibility, Decreased balance, Decreased activity tolerance, Decreased range of motion, Decreased strength, Postural dysfunction, Decreased mobility, Pain, Improper body mechanics  Visit Diagnosis: Repeated falls  Muscle weakness (generalized)     Problem List Patient Active Problem List   Diagnosis Date Noted  . Elevated troponin 07/13/2018  . LBBB (left bundle branch block) 07/13/2018  . LVH (left ventricular hypertrophy) 07/13/2018  . Coronary artery calcification seen on CAT scan 07/13/2018  . Bacteremia due to Enterococcus 07/02/2018  . E. coli UTI 07/02/2018  . Iron deficiency anemia 07/02/2018  . Hypomagnesemia 07/02/2018  . Hypocalcemia 07/02/2018  . Hypokalemia 07/02/2018  . Coronary artery disease 07/02/2018  . H/O eye surgery 07/02/2018  .  Abdominal pain   . Acute lower UTI 06/25/2018  . Sepsis ([place]) 06/25/2018  . Acute cystitis with hematuria   . Venous stasis 11/20/2014  . Lung nodule < 6cm on CT 11/20/2014  . Left lower lobe pneumonia 11/17/2014  . Influenza A (H1N1) 11/06/2012  . Hematemesis 11/06/2012  . CAP (community acquired pneumonia) 11/05/2012  . Hypoxemia 11/05/2012  . SOB (shortness of breath) 11/05/2012  . Multiple sclerosis ([place]) 11/05/2012     Janene Harvey, PT, DPT 12/07/19 12:03 PM   [facility] [address]  [place], [place], [address] Phone: [phone]   Fax:  [phone]  Name: Heather Lutz MRN: [id number] Date of Birth: Feb 03, 1946

## 2019-12-20 ENCOUNTER — Ambulatory Visit: Payer: Medicare Other | Attending: Internal Medicine | Admitting: Physical Therapy

## 2019-12-20 ENCOUNTER — Encounter: Payer: Self-pay | Admitting: Physical Therapy

## 2019-12-20 ENCOUNTER — Other Ambulatory Visit: Payer: Self-pay

## 2019-12-20 DIAGNOSIS — R296 Repeated falls: Secondary | ICD-10-CM | POA: Insufficient documentation

## 2019-12-20 DIAGNOSIS — R29898 Other symptoms and signs involving the musculoskeletal system: Secondary | ICD-10-CM | POA: Diagnosis present

## 2019-12-20 DIAGNOSIS — M6281 Muscle weakness (generalized): Secondary | ICD-10-CM | POA: Diagnosis present

## 2019-12-20 NOTE — Therapy (Addendum)
[facility] [address]  [place], [place], [address] Phone: [phone]   Fax:  [phone]  Physical Therapy Progress Note  Patient Details  Name: Heather Lutz MRN: [id number] Date of Birth: 1946/03/27 Referring Provider (PT): Adline Mango, MD    Progress Note Reporting Period 11/03/19 to 12/20/19  See note below for Objective Data and Assessment of Progress/Goals.     Encounter Date: 12/20/2019  PT End of Session - 12/20/19 1155    Visit Number  18    Number of Visits  22    Date for PT Re-Evaluation  01/17/20    Authorization Type  [employer] Medicare    PT Start Time  1033   pt late   PT Stop Time  1104    PT Time Calculation (min)  31 min    Equipment Utilized During Treatment  Gait belt    Activity Tolerance  Patient tolerated treatment well    Behavior During Therapy  WFL for tasks assessed/performed       Past Medical History:  Diagnosis Date  . Blindness   . GERD (gastroesophageal reflux disease)   . Multiple sclerosis ([place])   . Osteoporosis     Past Surgical History:  Procedure Laterality Date  . ABDOMINAL HYSTERECTOMY    . CYSTOSCOPY WITH RETROGRADE PYELOGRAM, URETEROSCOPY AND STENT PLACEMENT Left 08/01/2018   Procedure: CYSTOSCOPY WITH RETROGRADE PYELOGRAM, URETEROSCOPY, HOLMIUM LASER AND STENT PLACEMENT;  Surgeon: Lucas Mallow, MD;  Location: [facility];  Service: Urology;  Laterality: Left;  . ESOPHAGOGASTRODUODENOSCOPY N/A 11/06/2012   Procedure: ESOPHAGOGASTRODUODENOSCOPY (EGD);  Surgeon: Lear Ng, MD;  Location: [facility];  Service: Endoscopy;  Laterality: N/A;    There were no vitals filed for this visit.  Subjective Assessment - 12/20/19 1034    Subjective  Has been doing well and denies recent falls. Has been trying to work on [place] her muscles at home. Reports 60% improvement since initial eval. Would like to continue strengthening her LEs. Has not tried  transfering into her bed.    Pertinent History  MS, blind, Osteoporosis, GERD, venous stasis, h/o eye surgery, h/o sepsis with hospitalization, UTI's, Left BBB, Left Ventiricular Hypertrophy    Patient Stated Goals  "I want to walk again"    Currently in Pain?  Yes    Pain Score  3     Pain Location  Leg    Pain Orientation  Right    Pain Descriptors / Indicators  --   "nerve pain"   Pain Type  Chronic pain;Neuropathic pain         [facility] PT Assessment - 12/20/19 0001      Assessment   Medical Diagnosis  weakness, h/o falls    Referring Provider (PT)  Adline Mango, MD    Onset Date/Surgical Date  --   progressing over the last several years                  [facility] Adult PT Treatment/Exercise - 12/20/19 0001      Bed Mobility   Bed Mobility  Rolling Right;Rolling Left;Right Sidelying to Sit;Sit to Sidelying Right;Sitting - Scoot to Edge of Bed    Rolling Right  Supervision/verbal cueing    Rolling Left  Supervision/Verbal cueing    Right Sidelying to Sit  Minimal Assistance - Patient > 75%    Sitting - Scoot to Edge of Bed  Set up assist    Sit to Sidelying  Right  Minimal Assistance - Patient > 75%      Transfers   Transfers  Sit to Stand    Sit to Stand  3: Mod assist   mod A x 2 with RW   Number of Reps  --   x2 - 1st rep 2 sec, 2nd rep 5 sec   Comments  VCs required for forward scooting and anterior trunk lean, assistance with foot positioning and blocking R knee from buckling             PT Education - 12/20/19 1153    Education Details  discussion on objective progress and remaining impairments and goals as well agreement on POC; edu on importance of HEP compliance and transfering skills from PT to home    Person(s) Educated  Patient;Caregiver(s)   Jasmine   Methods  Explanation    Comprehension  Verbalized understanding       PT Short Term Goals - 12/20/19 1037      PT SHORT TERM GOAL #1   Title  Pt will be modified independent in her HEP     Time  4    Period  Weeks    Status  Achieved   reporting compliance until recent exaccerbation of weakness   Target Date  10/10/19      PT SHORT TERM GOAL #2   Title  Pt will be able to tolerate 15 minutes of exercise without rest break.    Time  2    Period  Weeks    Status  Partially Met   reporting 10-15 min before requiring rest break   Target Date  01/03/20        PT Long Term Goals - 12/20/19 1038      PT LONG TERM GOAL #1   Title  Pt will be able to perform sit to stand with Contact Guard assistance with rolling walker.    Baseline  Mod-max assistance on 08/01/2019    Time  4    Period  Weeks    Status  On-going   requiring mod x2 with RW   Target Date  01/17/20      PT LONG TERM GOAL #2   Title  Pt will be able to perform sit to stand transfer with minimal assistance using LRAD.    Baseline  max assistance on 08/01/2019    Time  4    Period  Weeks    Status  Partially Met   requiring mod x2 with RW   Target Date  01/17/20      PT LONG TERM GOAL #3   Title  Pt will be able to perform supine to sit modified independently.    Baseline  requires varying assistance from caregivers    Time  4    Period  Weeks    Status  On-going   able to perform rolling with supervision and sidelying to sit with min A   Target Date  01/17/20      PT LONG TERM GOAL #4   Title  Patient to demonstrate and recall importance of maintaining more upright sitting posture in W/C and report ability to maintain 10 min of upright sitting.    Time  6    Period  Weeks    Status  Achieved   tolerated 15 min of unsupported sitting on 11/21/19     PT LONG TERM GOAL #5   Title  Patient to perform 30 sec of standing with RW and  min A.    Time  4    Period  Weeks    Status  On-going   5 sec with RW and mod A x 2   Target Date  01/17/20            Plan - 12/20/19 1204    Clinical Impression Statement  Patient reports 60% improvement since initial eval. Would like to continue  strengthening her LEs, but admits to not attempting to transfer into her bed at home d/t limited time with caregivers. Patient now noting that she is able to tolerate 10-15 min of her exercises before requiring rest break. Patient was able to perform STS with mod A x 2 today. Still demonstrating difficulty with quad stability and knee buckling, requiring considerable assistance to sit. Limited to 5 sec of standing with RW and mod A x 2 d/t LE buckling. Did demonstrate improvement with bed mobility as she was able to perform rolling with supervision and sidelying to sit with min A today. Patient has previously met her sitting balance goal. Patient is demonstrating good progress with therapy, particularly with sitting balance and bed mobility. Would benefit from skilled PT services 1x/week for 4 weeks to address remaining impairments and continue working towards STS transfers.    Comorbidities  MS, blind, Osteoporosis, GERD, venous stasis, h/o eye surgery, h/o sepsis with hospitalization, UTI's, Left BBB, Left Ventiricular Hypertrophy    Stability/Clinical Decision Making  Evolving/Moderate complexity    Rehab Potential  Fair    PT Frequency  1x / week    PT Duration  4 weeks    PT Treatment/Interventions  ADLs/Self Care Home Management;Therapeutic activities;Gait training;Functional mobility training;Neuromuscular re-education;Balance training;Therapeutic exercise;Patient/family education;Wheelchair mobility training;Manual techniques;Passive range of motion;Energy conservation;Cryotherapy;Moist Heat    PT Next Visit Plan  transfers (sit to stand, w/c to mat), LE strengthening, UE strengthening    PT Home Exercise Plan  Access Code: [email or web address] (rows with red therdand, trunk flexion/forward reaching with activation of quads for standing)    Consulted and Agree with Plan of Care  Patient;Family member/caregiver    Family Member Consulted  [place] (CNA)       Patient will benefit from skilled therapeutic  intervention in order to improve the following deficits and impairments:  Impaired flexibility, Decreased balance, Decreased activity tolerance, Decreased range of motion, Decreased strength, Postural dysfunction, Decreased mobility, Pain, Improper body mechanics  Visit Diagnosis: Repeated falls - Plan: PT plan of care cert/re-cert  Muscle weakness (generalized) - Plan: PT plan of care cert/re-cert  Impaired flexibility of lower extremity - Plan: PT plan of care cert/re-cert     Problem List Patient Active Problem List   Diagnosis Date Noted  . Elevated troponin 07/13/2018  . LBBB (left bundle branch block) 07/13/2018  . LVH (left ventricular hypertrophy) 07/13/2018  . Coronary artery calcification seen on CAT scan 07/13/2018  . Bacteremia due to Enterococcus 07/02/2018  . E. coli UTI 07/02/2018  . Iron deficiency anemia 07/02/2018  . Hypomagnesemia 07/02/2018  . Hypocalcemia 07/02/2018  . Hypokalemia 07/02/2018  . Coronary artery disease 07/02/2018  . H/O eye surgery 07/02/2018  . Abdominal pain   . Acute lower UTI 06/25/2018  . Sepsis ([place]) 06/25/2018  . Acute cystitis with hematuria   . Venous stasis 11/20/2014  . Lung nodule < 6cm on CT 11/20/2014  . Left lower lobe pneumonia 11/17/2014  . Influenza A (H1N1) 11/06/2012  . Hematemesis 11/06/2012  . CAP (community acquired pneumonia) 11/05/2012  . Hypoxemia 11/05/2012  .  SOB (shortness of breath) 11/05/2012  . Multiple sclerosis ([place]) 11/05/2012     Janene Harvey, PT, DPT 12/20/19 12:12 PM   [facility] [address]  [place], [place], [address] Phone: [phone]   Fax:  [phone]  Name: Heather Lutz MRN: [id number] Date of Birth: Apr 09, 1946

## 2019-12-21 ENCOUNTER — Ambulatory Visit: Payer: Medicare Other | Admitting: Physical Therapy

## 2019-12-29 ENCOUNTER — Other Ambulatory Visit: Payer: Self-pay

## 2019-12-29 ENCOUNTER — Ambulatory Visit: Payer: Medicare Other | Admitting: Physical Therapy

## 2019-12-29 ENCOUNTER — Encounter: Payer: Self-pay | Admitting: Physical Therapy

## 2019-12-29 VITALS — BP 123/65 | HR 74

## 2019-12-29 DIAGNOSIS — M6281 Muscle weakness (generalized): Secondary | ICD-10-CM

## 2019-12-29 DIAGNOSIS — R296 Repeated falls: Secondary | ICD-10-CM | POA: Diagnosis not present

## 2019-12-29 NOTE — Therapy (Signed)
[facility] [address]  [place], [place], [address] Phone: [phone]   Fax:  [phone]  Physical Therapy Treatment  Patient Details  Name: Heather Lutz MRN: [id number] Date of Birth: March 25, 1946 Referring Provider (PT): Adline Mango, MD   Encounter Date: 12/29/2019  PT End of Session - 12/29/19 1211    Visit Number  19    Number of Visits  22    Date for PT Re-Evaluation  01/17/20    Authorization Type  UHC Medicare    PT Start Time  1059    PT Stop Time  1201    PT Time Calculation (min)  62 min    Equipment Utilized During Treatment  Gait belt    Activity Tolerance  Patient tolerated treatment well    Behavior During Therapy  WFL for tasks assessed/performed       Past Medical History:  Diagnosis Date  . Blindness   . GERD (gastroesophageal reflux disease)   . Multiple sclerosis ([place])   . Osteoporosis     Past Surgical History:  Procedure Laterality Date  . ABDOMINAL HYSTERECTOMY    . CYSTOSCOPY WITH RETROGRADE PYELOGRAM, URETEROSCOPY AND STENT PLACEMENT Left 08/01/2018   Procedure: CYSTOSCOPY WITH RETROGRADE PYELOGRAM, URETEROSCOPY, HOLMIUM LASER AND STENT PLACEMENT;  Surgeon: Lucas Mallow, MD;  Location: [facility];  Service: Urology;  Laterality: Left;  . ESOPHAGOGASTRODUODENOSCOPY N/A 11/06/2012   Procedure: ESOPHAGOGASTRODUODENOSCOPY (EGD);  Surgeon: Lear Ng, MD;  Location: [facility] ENDOSCOPY;  Service: Endoscopy;  Laterality: N/A;    Vitals:   12/29/19 1100  BP: 123/65  Pulse: 74  SpO2: 99%    Subjective Assessment - 12/29/19 1108    Subjective  Started to have a pain over the L side of her chest about a week ago. THis has previously happened when she had home health on the opposite side. Today having increased nerve pain down the L LE. Denies falls since last session. Has not attempted to transfer into bed as her bed is too high and her husband is recovery from a surgery.     Pertinent History  MS, blind, Osteoporosis, GERD, venous stasis, h/o eye surgery, h/o sepsis with hospitalization, UTI's, Left BBB, Left Ventiricular Hypertrophy    Patient Stated Goals  "I want to walk again"    Currently in Pain?  Yes    Pain Score  6     Pain Location  Leg    Pain Orientation  Right    Pain Descriptors / Indicators  --   nerve pain   Pain Type  Acute pain;Neuropathic pain                       [facility] Adult PT Treatment/Exercise - 12/29/19 0001      Transfers   Transfers  Squat Pivot Transfers    Sit to Stand  2: Max assist   max A PT + min A from caregiver   Comments  x2 to/from w/c and nustep      Knee/Hip Exercises: Aerobic   Nustep  L5 x7 min (UE/LEs)      Shoulder Exercises: Seated   Other Seated Exercises  B scaption x5    cues for scap retraction     Shoulder Exercises: Stretch   Corner Stretch  1 rep    Corner Stretch Limitations  attempted L 90/90 stretch in doorway   discontinued d/t pain     Manual Therapy  Manual Therapy  Soft tissue mobilization;Myofascial release    Manual therapy comments  sitting    Soft tissue mobilization  STM to L pec and proximal biceps tendon- TTP and     Myofascial Release  TPR to L pec- TTP and palpable taut muscle band    Passive ROM  L passive 90/90 pec stretch 3x30" to tolerance             PT Education - 12/29/19 1209    Education Details  update to HEP and thorough review/edu on proper form with HEP as well as review of posture in chair for decreased injury risk    Person(s) Educated  Patient;Caregiver(s)    Methods  Explanation;Demonstration;Tactile cues;Verbal cues;Handout    Comprehension  Verbalized understanding;Returned demonstration       PT Short Term Goals - 12/20/19 1037      PT SHORT TERM GOAL #1   Title  Pt will be modified independent in her HEP    Time  4    Period  Weeks    Status  Achieved   reporting compliance until recent exaccerbation of weakness   Target  Date  10/10/19      PT SHORT TERM GOAL #2   Title  Pt will be able to tolerate 15 minutes of exercise without rest break.    Time  2    Period  Weeks    Status  Partially Met   reporting 10-15 min before requiring rest break   Target Date  01/03/20        PT Long Term Goals - 12/20/19 1038      PT LONG TERM GOAL #1   Title  Pt will be able to perform sit to stand with Contact Guard assistance with rolling walker.    Baseline  Mod-max assistance on 08/01/2019    Time  4    Period  Weeks    Status  On-going   requiring mod x2 with RW   Target Date  01/17/20      PT LONG TERM GOAL #2   Title  Pt will be able to perform sit to stand transfer with minimal assistance using LRAD.    Baseline  max assistance on 08/01/2019    Time  4    Period  Weeks    Status  Partially Met   requiring mod x2 with RW   Target Date  01/17/20      PT LONG TERM GOAL #3   Title  Pt will be able to perform supine to sit modified independently.    Baseline  requires varying assistance from caregivers    Time  4    Period  Weeks    Status  On-going   able to perform rolling with supervision and sidelying to sit with min A   Target Date  01/17/20      PT LONG TERM GOAL #4   Title  Patient to demonstrate and recall importance of maintaining more upright sitting posture in W/C and report ability to maintain 10 min of upright sitting.    Time  6    Period  Weeks    Status  Achieved   tolerated 15 min of unsupported sitting on 11/21/19     PT LONG TERM GOAL #5   Title  Patient to perform 30 sec of standing with RW and min A.    Time  4    Period  Weeks    Status  On-going  5 sec with RW and mod A x 2   Target Date  01/17/20            Plan - 12/29/19 1215    Clinical Impression Statement  Patient arrived to session with caregiver, with report of L sided chest pain which began a week or so ago. Patient notes that she has also been working on UE strengthening exercises 5x/week recently.  Upon palpation, pain was reproduced with pressure over the L pec and proximal biceps tendons. Patient tolerated STM to these muscles as well as gentle passive pec stretching without issue. Attempted pec self-stretch in doorway which was limited by shoulder pain as patient with hx of fx in this arm, thus this was discontinued. Discussed patient's current UE exercise regimen which consisted of overhead lifting with poor posture, thus educated patient on proper posture and alignment with exercises no higher than shoulder height to avoid impingement. Updated HEP with these exercises as patient reported good understanding. Also discussed patient's goal of transferring into her bed at home, however caregiver notes that it is likely too high to safely transfer into. Thus advised patient not to attempt this if she feels unsafe, and instead encouraged patient to look into DME that may better allow her to safely transfer into her chair. Patient agreeable and without complaints at end of session.    Comorbidities  MS, blind, Osteoporosis, GERD, venous stasis, h/o eye surgery, h/o sepsis with hospitalization, UTI's, Left BBB, Left Ventiricular Hypertrophy    Stability/Clinical Decision Making  Evolving/Moderate complexity    Rehab Potential  Fair    PT Frequency  1x / week    PT Duration  4 weeks    PT Treatment/Interventions  ADLs/Self Care Home Management;Therapeutic activities;Gait training;Functional mobility training;Neuromuscular re-education;Balance training;Therapeutic exercise;Patient/family education;Wheelchair mobility training;Manual techniques;Passive range of motion;Energy conservation;Cryotherapy;Moist Heat    PT Next Visit Plan  transfers (sit to stand, w/c to mat), LE strengthening, UE strengthening    PT Home Exercise Plan  Access Code: [email or web address] (rows with red therdand, trunk flexion/forward reaching with activation of quads for standing)    Consulted and Agree with Plan of Care  Patient;Family  member/caregiver    Family Member Consulted  [place] (CNA)       Patient will benefit from skilled therapeutic intervention in order to improve the following deficits and impairments:  Impaired flexibility, Decreased balance, Decreased activity tolerance, Decreased range of motion, Decreased strength, Postural dysfunction, Decreased mobility, Pain, Improper body mechanics  Visit Diagnosis: Repeated falls  Muscle weakness (generalized)     Problem List Patient Active Problem List   Diagnosis Date Noted  . Elevated troponin 07/13/2018  . LBBB (left bundle branch block) 07/13/2018  . LVH (left ventricular hypertrophy) 07/13/2018  . Coronary artery calcification seen on CAT scan 07/13/2018  . Bacteremia due to Enterococcus 07/02/2018  . E. coli UTI 07/02/2018  . Iron deficiency anemia 07/02/2018  . Hypomagnesemia 07/02/2018  . Hypocalcemia 07/02/2018  . Hypokalemia 07/02/2018  . Coronary artery disease 07/02/2018  . H/O eye surgery 07/02/2018  . Abdominal pain   . Acute lower UTI 06/25/2018  . Sepsis ([place]) 06/25/2018  . Acute cystitis with hematuria   . Venous stasis 11/20/2014  . Lung nodule < 6cm on CT 11/20/2014  . Left lower lobe pneumonia 11/17/2014  . Influenza A (H1N1) 11/06/2012  . Hematemesis 11/06/2012  . CAP (community acquired pneumonia) 11/05/2012  . Hypoxemia 11/05/2012  . SOB (shortness of breath) 11/05/2012  . Multiple sclerosis ([place]) 11/05/2012  Janene Harvey, PT, DPT 12/29/19 12:25 PM   [facility] [address]  [place], [place], [address] Phone: [phone]   Fax:  [phone]  Name: Heather Lutz MRN: [id number] Date of Birth: 05-Oct-1945

## 2020-01-03 ENCOUNTER — Other Ambulatory Visit: Payer: Self-pay

## 2020-01-03 ENCOUNTER — Encounter: Payer: Self-pay | Admitting: Physical Therapy

## 2020-01-03 ENCOUNTER — Ambulatory Visit: Payer: Medicare Other | Admitting: Physical Therapy

## 2020-01-03 VITALS — BP 122/72 | HR 75

## 2020-01-03 DIAGNOSIS — M6281 Muscle weakness (generalized): Secondary | ICD-10-CM

## 2020-01-03 DIAGNOSIS — R296 Repeated falls: Secondary | ICD-10-CM

## 2020-01-03 NOTE — Therapy (Signed)
[facility] [address]  [place], [place], [address] Phone: [phone]   Fax:  [phone]  Physical Therapy Treatment  Patient Details  Name: Heather Lutz MRN: [id number] Date of Birth: 11-27-1945 Referring Provider (PT): Adline Mango, MD   Encounter Date: 01/03/2020  PT End of Session - 01/03/20 1437    Visit Number  20    Number of Visits  22    Date for PT Re-Evaluation  01/17/20    Authorization Type  [employer] Medicare    PT Start Time  1319    PT Stop Time  1401    PT Time Calculation (min)  42 min    Equipment Utilized During Treatment  Gait belt    Activity Tolerance  Patient tolerated treatment well    Behavior During Therapy  [facility] for tasks assessed/performed       Past Medical History:  Diagnosis Date  . Blindness   . GERD (gastroesophageal reflux disease)   . Multiple sclerosis ([place])   . Osteoporosis     Past Surgical History:  Procedure Laterality Date  . ABDOMINAL HYSTERECTOMY    . CYSTOSCOPY WITH RETROGRADE PYELOGRAM, URETEROSCOPY AND STENT PLACEMENT Left 08/01/2018   Procedure: CYSTOSCOPY WITH RETROGRADE PYELOGRAM, URETEROSCOPY, HOLMIUM LASER AND STENT PLACEMENT;  Surgeon: Lucas Mallow, MD;  Location: [facility];  Service: Urology;  Laterality: Left;  . ESOPHAGOGASTRODUODENOSCOPY N/A 11/06/2012   Procedure: ESOPHAGOGASTRODUODENOSCOPY (EGD);  Surgeon: Lear Ng, MD;  Location: [facility] ENDOSCOPY;  Service: Endoscopy;  Laterality: N/A;    Vitals:   01/03/20 1322  BP: 122/72  Pulse: 75  SpO2: 93%    Subjective Assessment - 01/03/20 1326    Subjective  Not much new since last session. L side of chest is feeling better. LE nerve pain has been bothering her more lately.    Pertinent History  MS, blind, Osteoporosis, GERD, venous stasis, h/o eye surgery, h/o sepsis with hospitalization, UTI's, Left BBB, Left Ventiricular Hypertrophy    Patient Stated Goals  "I want to walk again"    Currently in Pain?  Yes    Pain Score  4     Pain Location  Leg    Pain Orientation  Right;Left    Pain Descriptors / Indicators  --   "nerve pain"   Pain Type  Acute pain;Neuropathic pain                       [facility] Adult PT Treatment/Exercise - 01/03/20 0001      Transfers   Transfers  Squat Pivot Transfers    Sit to Stand  2: Max assist   max A from PT + min A from caregiver   Comments  x2 w/c<>nustep ad w/c<>mat      Neuro Re-ed    Neuro Re-ed Details   modified sit up with yellow medball at chest + white wedge behind x10;  sitting trunk rotation with yellow medball at chest x10 each side; R/L sidebody stretch while leaning forearm on mat with min A x10 each, ab set into beach ball 10x5", sitting trunk rotation with yellow TB x10 each LE      Knee/Hip Exercises: Aerobic   Nustep  L5 x7 min (UE/LEs)      Knee/Hip Exercises: Seated   Long Arc Quad  Strengthening;Right;Left;1 set;10 reps    [employer] Limitations  with CGA/min A for sitting balance  PT Education - 01/03/20 1435    Education Details  provided patient contact information for vendor for DME as patient requires a bed with adjustable height    Person(s) Educated  Patient;Caregiver(s)    Methods  Explanation;Handout    Comprehension  Verbalized understanding       PT Short Term Goals - 12/20/19 1037      PT SHORT TERM GOAL #1   Title  Pt will be modified independent in her HEP    Time  4    Period  Weeks    Status  Achieved   reporting compliance until recent exaccerbation of weakness   Target Date  10/10/19      PT SHORT TERM GOAL #2   Title  Pt will be able to tolerate 15 minutes of exercise without rest break.    Time  2    Period  Weeks    Status  Partially Met   reporting 10-15 min before requiring rest break   Target Date  01/03/20        PT Long Term Goals - 12/20/19 1038      PT LONG TERM GOAL #1   Title  Pt will be able to perform sit to stand with  Contact Guard assistance with rolling walker.    Baseline  Mod-max assistance on 08/01/2019    Time  4    Period  Weeks    Status  On-going   requiring mod x2 with RW   Target Date  01/17/20      PT LONG TERM GOAL #2   Title  Pt will be able to perform sit to stand transfer with minimal assistance using LRAD.    Baseline  max assistance on 08/01/2019    Time  4    Period  Weeks    Status  Partially Met   requiring mod x2 with RW   Target Date  01/17/20      PT LONG TERM GOAL #3   Title  Pt will be able to perform supine to sit modified independently.    Baseline  requires varying assistance from caregivers    Time  4    Period  Weeks    Status  On-going   able to perform rolling with supervision and sidelying to sit with min A   Target Date  01/17/20      PT LONG TERM GOAL #4   Title  Patient to demonstrate and recall importance of maintaining more upright sitting posture in W/C and report ability to maintain 10 min of upright sitting.    Time  6    Period  Weeks    Status  Achieved   tolerated 15 min of unsupported sitting on 11/21/19     PT LONG TERM GOAL #5   Title  Patient to perform 30 sec of standing with RW and min A.    Time  4    Period  Weeks    Status  On-going   5 sec with RW and mod A x 2   Target Date  01/17/20            Plan - 01/03/20 1437    Clinical Impression Statement  Patient with report of improvement in L pec pain since last session. Worked on core and LE strengthening exercises in unsupported sitting to challenge sitting balance. Patient with good tolerance for weighted modified sit ups. More challenge demonstrated with trunk rotation activities, as patient with visibly limited  ROM. Able to perform LAQ in unsupported sitting with CGA/min A intermittently for balance, however patient did perform with an improvement in knee extension ROM d/t improvement in quad recruitment. Provided patient with information on DME vendor for possible purchase of  adjustable bed. Patient without complaints at end of session. Patient continues to demonstrate improvement in core strength.    Comorbidities  MS, blind, Osteoporosis, GERD, venous stasis, h/o eye surgery, h/o sepsis with hospitalization, UTI's, Left BBB, Left Ventiricular Hypertrophy    Stability/Clinical Decision Making  Evolving/Moderate complexity    Rehab Potential  Fair    PT Frequency  1x / week    PT Duration  4 weeks    PT Treatment/Interventions  ADLs/Self Care Home Management;Therapeutic activities;Gait training;Functional mobility training;Neuromuscular re-education;Balance training;Therapeutic exercise;Patient/family education;Wheelchair mobility training;Manual techniques;Passive range of motion;Energy conservation;Cryotherapy;Moist Heat    PT Next Visit Plan  transfers (sit to stand, w/c to mat), LE strengthening, UE strengthening    PT Home Exercise Plan  Access Code: [email or web address] (rows with red therdand, trunk flexion/forward reaching with activation of quads for standing)    Consulted and Agree with Plan of Care  Patient;Family member/caregiver    Family Member Consulted  [place] (CNA)       Patient will benefit from skilled therapeutic intervention in order to improve the following deficits and impairments:  Impaired flexibility, Decreased balance, Decreased activity tolerance, Decreased range of motion, Decreased strength, Postural dysfunction, Decreased mobility, Pain, Improper body mechanics  Visit Diagnosis: Repeated falls  Muscle weakness (generalized)     Problem List Patient Active Problem List   Diagnosis Date Noted  . Elevated troponin 07/13/2018  . LBBB (left bundle branch block) 07/13/2018  . LVH (left ventricular hypertrophy) 07/13/2018  . Coronary artery calcification seen on CAT scan 07/13/2018  . Bacteremia due to Enterococcus 07/02/2018  . E. coli UTI 07/02/2018  . Iron deficiency anemia 07/02/2018  . Hypomagnesemia 07/02/2018  . Hypocalcemia  07/02/2018  . Hypokalemia 07/02/2018  . Coronary artery disease 07/02/2018  . H/O eye surgery 07/02/2018  . Abdominal pain   . Acute lower UTI 06/25/2018  . Sepsis ([place]) 06/25/2018  . Acute cystitis with hematuria   . Venous stasis 11/20/2014  . Lung nodule < 6cm on CT 11/20/2014  . Left lower lobe pneumonia 11/17/2014  . Influenza A (H1N1) 11/06/2012  . Hematemesis 11/06/2012  . CAP (community acquired pneumonia) 11/05/2012  . Hypoxemia 11/05/2012  . SOB (shortness of breath) 11/05/2012  . Multiple sclerosis ([place]) 11/05/2012     Janene Harvey, PT, DPT 01/03/20 2:42 PM   [facility] [address]  [place], [place], [address] Phone: [phone]   Fax:  [phone]  Name: Heather Lutz MRN: [id number] Date of Birth: 11/19/1945

## 2020-01-10 ENCOUNTER — Other Ambulatory Visit: Payer: Self-pay

## 2020-01-10 ENCOUNTER — Ambulatory Visit: Payer: Medicare Other | Admitting: Physical Therapy

## 2020-01-10 ENCOUNTER — Encounter: Payer: Self-pay | Admitting: Physical Therapy

## 2020-01-10 VITALS — BP 130/71 | HR 77

## 2020-01-10 DIAGNOSIS — M6281 Muscle weakness (generalized): Secondary | ICD-10-CM

## 2020-01-10 DIAGNOSIS — R296 Repeated falls: Secondary | ICD-10-CM

## 2020-01-10 NOTE — Therapy (Signed)
[facility] [address]  [place], [place], [address] Phone: [phone]   Fax:  [phone]  Physical Therapy Treatment  Patient Details  Name: Heather Lutz MRN: [id number] Date of Birth: 08-23-1946 Referring Provider (PT): Adline Mango, MD   Encounter Date: 01/10/2020  PT End of Session - 01/10/20 1155    Visit Number  21    Number of Visits  22    Date for PT Re-Evaluation  01/17/20    Authorization Type  [employer]HC Medicare    PT Start Time  1100    PT Stop Time  1149    PT Time Calculation (min)  49 min    Equipment Utilized During Treatment  Gait belt    Activity Tolerance  Patient tolerated treatment well    Behavior During Therapy  [facility] for tasks assessed/performed       Past Medical History:  Diagnosis Date  . Blindness   . GERD (gastroesophageal reflux disease)   . Multiple sclerosis ([place])   . Osteoporosis     Past Surgical History:  Procedure Laterality Date  . ABDOMINAL HYSTERECTOMY    . CYSTOSCOPY WITH RETROGRADE PYELOGRAM, URETEROSCOPY AND STENT PLACEMENT Left 08/01/2018   Procedure: CYSTOSCOPY WITH RETROGRADE PYELOGRAM, URETEROSCOPY, HOLMIUM LASER AND STENT PLACEMENT;  Surgeon: Lucas Mallow, MD;  Location: [facility];  Service: Urology;  Laterality: Left;  . ESOPHAGOGASTRODUODENOSCOPY N/A 11/06/2012   Procedure: ESOPHAGOGASTRODUODENOSCOPY (EGD);  Surgeon: Lear Ng, MD;  Location: [facility];  Service: Endoscopy;  Laterality: N/A;    Vitals:   01/10/20 1101  BP: 130/71  Pulse: 77  SpO2: 93%    Subjective Assessment - 01/10/20 1108    Subjective  Dealing with the same old nerve pain.    Pertinent History  MS, blind, Osteoporosis, GERD, venous stasis, h/o eye surgery, h/o sepsis with hospitalization, UTI's, Left BBB, Left Ventiricular Hypertrophy    Patient Stated Goals  "I want to walk again"    Currently in Pain?  Yes    Pain Score  4     Pain Location  Leg    Pain  Orientation  Right;Left    Pain Descriptors / Indicators  --   nerve pain   Pain Type  Neuropathic pain    Pain Radiating Towards  R LE and L foot                       [facility] Adult PT Treatment/Exercise - 01/10/20 0001      Bed Mobility   Bed Mobility  Rolling Right;Rolling Left;Right Sidelying to Sit;Sit to Sidelying Right;Sitting - Scoot to Edge of Bed   attempted L sidelying to quadruped but pt unable d/t weaknes   Rolling Right  Contact Guard/Touching assist;Supervision/verbal cueing    Rolling Left  Minimal Assistance - Patient > 75%   for LEs   Right Sidelying to Sit  Minimal Assistance - Patient > 75%    Sitting - Scoot to Edge of Bed  Set up assist    Sit to Sidelying Right  Minimal Assistance - Patient > 75%      Transfers   Transfers  Squat Pivot Transfers    Sit to Stand  2: Max assist    Comments  x2 w/c<>nustep and w/c<>mat      Knee/Hip Exercises: Aerobic   Nustep  L5 x7 min (UE/LEs)      Knee/Hip Exercises: Supine   Other Supine Knee/Hip  Exercises  R & L SAQ 3x5 each LE with bolster under knees      Knee/Hip Exercises: Sidelying   Clams  R/L with AAROM from PT x10    Other Sidelying Knee/Hip Exercises  open book stretch x10 each side to tolerance      Manual Therapy   Manual therapy comments  sidelying    Passive ROM  R/L hip flexor stretch 2x30" each to tolerance             PT Education - 01/10/20 1154    Education Details  discussion with patient and caregiver on best positioning for floor transfers in the event of a fall    Person(s) Educated  Patient;Caregiver(s)   [place]   Methods  Explanation;Demonstration;Tactile cues;Verbal cues    Comprehension  Verbalized understanding;Returned demonstration       PT Short Term Goals - 12/20/19 1037      PT SHORT TERM GOAL #1   Title  Pt will be modified independent in her HEP    Time  4    Period  Weeks    Status  Achieved   reporting compliance until recent exaccerbation of  weakness   Target Date  10/10/19      PT SHORT TERM GOAL #2   Title  Pt will be able to tolerate 15 minutes of exercise without rest break.    Time  2    Period  Weeks    Status  Partially Met   reporting 10-15 min before requiring rest break   Target Date  01/03/20        PT Long Term Goals - 12/20/19 1038      PT LONG TERM GOAL #1   Title  Pt will be able to perform sit to stand with Contact Guard assistance with rolling walker.    Baseline  Mod-max assistance on 08/01/2019    Time  4    Period  Weeks    Status  On-going   requiring mod x2 with RW   Target Date  01/17/20      PT LONG TERM GOAL #2   Title  Pt will be able to perform sit to stand transfer with minimal assistance using LRAD.    Baseline  max assistance on 08/01/2019    Time  4    Period  Weeks    Status  Partially Met   requiring mod x2 with RW   Target Date  01/17/20      PT LONG TERM GOAL #3   Title  Pt will be able to perform supine to sit modified independently.    Baseline  requires varying assistance from caregivers    Time  4    Period  Weeks    Status  On-going   able to perform rolling with supervision and sidelying to sit with min A   Target Date  01/17/20      PT LONG TERM GOAL #4   Title  Patient to demonstrate and recall importance of maintaining more upright sitting posture in W/C and report ability to maintain 10 min of upright sitting.    Time  6    Period  Weeks    Status  Achieved   tolerated 15 min of unsupported sitting on 11/21/19     PT LONG TERM GOAL #5   Title  Patient to perform 30 sec of standing with RW and min A.    Time  4    Period  Weeks    Status  On-going   5 sec with RW and mod A x 2   Target Date  01/17/20            Plan - 01/10/20 1155    Clinical Impression Statement  Patient without new concerns today. Caregiver inquiring about best positioning to assist patient in transferring from the floor in the event of a fall. Educated patient and caregiver  on proper steps and positions for floor transfer. Attempted assisting patient into quadruped today but she was unable d/t weakness and hip pain. Sidelying positioning today also limited by B hip pain but improved with folder pillow between knees. Patient tolerated LE strengthening ther-ex with good effort throughout. ROM with SAQ on B LEs seemed to be closer to full range today. Worked on passive hip flexor stretching for improvement in comfort with positioning. Patient still requiring minor assistance with bed mobility and transfers d/t visual impairment and remaining weakness. No complaints at end of session.    Comorbidities  MS, blind, Osteoporosis, GERD, venous stasis, h/o eye surgery, h/o sepsis with hospitalization, UTI's, Left BBB, Left Ventiricular Hypertrophy    Stability/Clinical Decision Making  Evolving/Moderate complexity    Rehab Potential  Fair    PT Frequency  1x / week    PT Duration  4 weeks    PT Treatment/Interventions  ADLs/Self Care Home Management;Therapeutic activities;Gait training;Functional mobility training;Neuromuscular re-education;Balance training;Therapeutic exercise;Patient/family education;Wheelchair mobility training;Manual techniques;Passive range of motion;Energy conservation;Cryotherapy;Moist Heat    PT Next Visit Plan  transfers (sit to stand, w/c to mat), LE strengthening, UE strengthening    PT Home Exercise Plan  Access Code: [email or web address] (rows with red therdand, trunk flexion/forward reaching with activation of quads for standing)    Consulted and Agree with Plan of Care  Patient;Family member/caregiver    Family Member Consulted  [place] (CNA)       Patient will benefit from skilled therapeutic intervention in order to improve the following deficits and impairments:  Impaired flexibility, Decreased balance, Decreased activity tolerance, Decreased range of motion, Decreased strength, Postural dysfunction, Decreased mobility, Pain, Improper body mechanics  Visit  Diagnosis: Repeated falls  Muscle weakness (generalized)     Problem List Patient Active Problem List   Diagnosis Date Noted  . Elevated troponin 07/13/2018  . LBBB (left bundle branch block) 07/13/2018  . LVH (left ventricular hypertrophy) 07/13/2018  . Coronary artery calcification seen on CAT scan 07/13/2018  . Bacteremia due to Enterococcus 07/02/2018  . E. coli UTI 07/02/2018  . Iron deficiency anemia 07/02/2018  . Hypomagnesemia 07/02/2018  . Hypocalcemia 07/02/2018  . Hypokalemia 07/02/2018  . Coronary artery disease 07/02/2018  . H/O eye surgery 07/02/2018  . Abdominal pain   . Acute lower UTI 06/25/2018  . Sepsis ([place]) 06/25/2018  . Acute cystitis with hematuria   . Venous stasis 11/20/2014  . Lung nodule < 6cm on CT 11/20/2014  . Left lower lobe pneumonia 11/17/2014  . Influenza A (H1N1) 11/06/2012  . Hematemesis 11/06/2012  . CAP (community acquired pneumonia) 11/05/2012  . Hypoxemia 11/05/2012  . SOB (shortness of breath) 11/05/2012  . Multiple sclerosis ([place]) 11/05/2012     Janene Harvey, PT, DPT 01/10/20 12:03 PM   [facility] [address]  [place], [place], [address] Phone: [phone]   Fax:  [phone]  Name: Heather Lutz MRN: [id number] Date of Birth: 1946/02/27

## 2020-01-17 ENCOUNTER — Ambulatory Visit: Payer: Medicare Other | Attending: Internal Medicine | Admitting: Physical Therapy

## 2020-01-17 ENCOUNTER — Other Ambulatory Visit: Payer: Self-pay

## 2020-01-17 ENCOUNTER — Encounter: Payer: Self-pay | Admitting: Physical Therapy

## 2020-01-17 VITALS — BP 110/60 | HR 75

## 2020-01-17 DIAGNOSIS — R296 Repeated falls: Secondary | ICD-10-CM

## 2020-01-17 DIAGNOSIS — M6281 Muscle weakness (generalized): Secondary | ICD-10-CM | POA: Diagnosis present

## 2020-01-17 NOTE — Therapy (Addendum)
[facility] [address]  [place], [place], [address] Phone: [phone]   Fax:  [phone]  Physical Therapy Treatment  Patient Details  Name: Heather Lutz MRN: [id number] Date of Birth: Nov 25, 1945 Referring Provider (PT): Adline Mango, MD   Progress Note Reporting Period 12/29/19 to 01/17/20  See note below for Objective Data and Assessment of Progress/Goals.     Encounter Date: 01/17/2020  PT End of Session - 01/17/20 1456    Visit Number  22    Number of Visits  22    Date for PT Re-Evaluation  01/17/20    Authorization Type  [employer] Medicare    PT Start Time  1655    PT Stop Time  1403    PT Time Calculation (min)  50 min    Equipment Utilized During Treatment  Gait belt    Activity Tolerance  Patient tolerated treatment well    Behavior During Therapy  WFL for tasks assessed/performed       Past Medical History:  Diagnosis Date  . Blindness   . GERD (gastroesophageal reflux disease)   . Multiple sclerosis ([place])   . Osteoporosis     Past Surgical History:  Procedure Laterality Date  . ABDOMINAL HYSTERECTOMY    . CYSTOSCOPY WITH RETROGRADE PYELOGRAM, URETEROSCOPY AND STENT PLACEMENT Left 08/01/2018   Procedure: CYSTOSCOPY WITH RETROGRADE PYELOGRAM, URETEROSCOPY, HOLMIUM LASER AND STENT PLACEMENT;  Surgeon: Lucas Mallow, MD;  Location: [facility];  Service: Urology;  Laterality: Left;  . ESOPHAGOGASTRODUODENOSCOPY N/A 11/06/2012   Procedure: ESOPHAGOGASTRODUODENOSCOPY (EGD);  Surgeon: Lear Ng, MD;  Location: [facility] ENDOSCOPY;  Service: Endoscopy;  Laterality: N/A;    Vitals:   01/17/20 1316  BP: 110/60  Pulse: 75  SpO2: 99%    Subjective Assessment - 01/17/20 1316    Subjective  Having some soreness in B biceps since the weekend.    Pertinent History  MS, blind, Osteoporosis, GERD, venous stasis, h/o eye surgery, h/o sepsis with hospitalization, UTI's, Left BBB, Left Ventiricular  Hypertrophy    Patient Stated Goals  "I want to walk again"    Currently in Pain?  Yes    Pain Score  4     Pain Location  Leg    Pain Orientation  Right;Left    Pain Descriptors / Indicators  --   "nerve pain"   Pain Type  Neuropathic pain                       [facility] Adult PT Treatment/Exercise - 01/17/20 0001      Bed Mobility   Bed Mobility  Rolling Right;Rolling Left;Right Sidelying to Sit;Sit to Sidelying Right;Sitting - Scoot to Edge of Bed    Rolling Right  Contact Guard/Touching assist;Supervision/verbal cueing    Rolling Left  Minimal Assistance - Patient > 75%    Right Sidelying to Sit  Minimal Assistance - Patient > 75%   assistance required at LEs, pt able to perform the rest   Sitting - Scoot to Edge of Bed  Set up assist    Sit to Sidelying Right  Minimal Assistance - Patient > 75%      Transfers   Transfers  Squat Pivot Transfers    Sit to Stand  3: Mod assist   mod A x2 with RW   Number of Reps  1 set    Transfer Cueing  cueing for proper set up and anterior trunk  lean; patient still with difficulty acheiving TKE on B knees, causing poor standing tolerance    Comments  x2 w/c<>nustep and w/c<>mat      Neuro Re-ed    Neuro Re-ed Details   patient propped on white wedge and able to perform sit up into hooklying x2 with min A; able to sit unsupported in long sitting with B UEs holding onto LEs for x20 sec      Knee/Hip Exercises: Aerobic   Nustep  L5 x7 min (UE/LEs)             PT Education - 01/17/20 1454    Education Details  discussion on objective progress and remaining impairments; discussion on continuing fitness regimen at home via HEP and [place] attendance in order to use Nustep; educated caregiver on safe 2 person transfer from floor to w/c in the event of a fall    Person(s) Educated  Patient;Caregiver(s)   [place]   Methods  Explanation;Demonstration;Tactile cues;Verbal cues;Handout    Comprehension  Verbalized understanding        PT Short Term Goals - 01/17/20 1324      PT SHORT TERM GOAL #1   Title  Pt will be modified independent in her HEP    Time  4    Period  Weeks    Status  Achieved   reporting compliance until recent exaccerbation of weakness   Target Date  10/10/19      PT SHORT TERM GOAL #2   Title  Pt will be able to tolerate 15 minutes of exercise without rest break.    Time  2    Period  Weeks    Status  Partially Met   reporting 10-15 min before requiring rest break   Target Date  01/03/20        PT Long Term Goals - 01/17/20 1324      PT LONG TERM GOAL #1   Title  Pt will be able to perform sit to stand with Contact Guard assistance with rolling walker.    Baseline  Mod-max assistance on 08/01/2019    Time  4    Period  Weeks    Status  On-going   requiring mod x2 with RW     PT LONG TERM GOAL #2   Title  Pt will be able to perform sit to stand transfer with minimal assistance using LRAD.    Baseline  max assistance on 08/01/2019    Time  4    Period  Weeks    Status  Partially Met   requiring mod x2 with RW     PT LONG TERM GOAL #3   Title  Pt will be able to perform supine to sit modified independently.    Baseline  requires varying assistance from caregivers    Time  4    Period  Weeks    Status  On-going   able to perform rolling with min A/CGA and R sidleying>sit with min A     PT LONG TERM GOAL #4   Title  Patient to demonstrate and recall importance of maintaining more upright sitting posture in W/C and report ability to maintain 10 min of upright sitting.    Time  6    Period  Weeks    Status  Achieved   tolerated 15 min of unsupported sitting on 11/21/19     PT LONG TERM GOAL #5   Title  Patient to perform 30 sec of standing with RW  and min A.    Time  4    Period  Weeks    Status  On-going   unable d/t lack of TKE in B LEs           Plan - 01/17/20 1505    Clinical Impression Statement  Patient reporting increased pain in B biceps and  admitting to working on UE strengthening exercises daily. Counseled patient on importance of rest days for soreness relief. Patient reported understanding. Assessed goals today, with patient demonstrating poor ability to extend B knees upon standing from w/c with RW, requiring mod A x 2 today. Cueing for proper set up and anterior trunk lean provided for proper form. Participation with bed mobility has improved throughout PT sessions. Patient today demonstrating rolling with min A/CGA and R sidelying>sit with min A. Patient was also able to perform reclined supine>long sit with min A and able to maintain sitting balance in long sitting with UE support on LEs. Educated patient and caregiver of important of maintaining patient's ability to get into this position in order to be able to be transferred from floor>w/c with 2 person assist in the event of a fall. Patient has demonstrated good progress towards goals and is independent/mod I with HEP. Patient now on 30 day hold from therapy in the event of a decline.    Comorbidities  MS, blind, Osteoporosis, GERD, venous stasis, h/o eye surgery, h/o sepsis with hospitalization, UTI's, Left BBB, Left Ventiricular Hypertrophy    Stability/Clinical Decision Making  Evolving/Moderate complexity    Rehab Potential  Fair    PT Frequency  1x / week    PT Duration  4 weeks    PT Treatment/Interventions  ADLs/Self Care Home Management;Therapeutic activities;Gait training;Functional mobility training;Neuromuscular re-education;Balance training;Therapeutic exercise;Patient/family education;Wheelchair mobility training;Manual techniques;Passive range of motion;Energy conservation;Cryotherapy;Moist Heat    PT Next Visit Plan  30 day hold at this time    PT Home Exercise Plan  Access Code: [email or web address] (rows with red therdand, trunk flexion/forward reaching with activation of quads for standing)    Consulted and Agree with Plan of Care  Patient;Family member/caregiver    Family  Member Consulted  [place] (CNA)       Patient will benefit from skilled therapeutic intervention in order to improve the following deficits and impairments:  Impaired flexibility, Decreased balance, Decreased activity tolerance, Decreased range of motion, Decreased strength, Postural dysfunction, Decreased mobility, Pain, Improper body mechanics  Visit Diagnosis: Repeated falls  Muscle weakness (generalized)     Problem List Patient Active Problem List   Diagnosis Date Noted  . Elevated troponin 07/13/2018  . LBBB (left bundle branch block) 07/13/2018  . LVH (left ventricular hypertrophy) 07/13/2018  . Coronary artery calcification seen on CAT scan 07/13/2018  . Bacteremia due to Enterococcus 07/02/2018  . E. coli UTI 07/02/2018  . Iron deficiency anemia 07/02/2018  . Hypomagnesemia 07/02/2018  . Hypocalcemia 07/02/2018  . Hypokalemia 07/02/2018  . Coronary artery disease 07/02/2018  . H/O eye surgery 07/02/2018  . Abdominal pain   . Acute lower UTI 06/25/2018  . Sepsis ([place]) 06/25/2018  . Acute cystitis with hematuria   . Venous stasis 11/20/2014  . Lung nodule < 6cm on CT 11/20/2014  . Left lower lobe pneumonia 11/17/2014  . Influenza A (H1N1) 11/06/2012  . Hematemesis 11/06/2012  . CAP (community acquired pneumonia) 11/05/2012  . Hypoxemia 11/05/2012  . SOB (shortness of breath) 11/05/2012  . Multiple sclerosis ([place]) 11/05/2012     Janene Harvey, PT, DPT  01/17/20 5:23 PM   [facility] [address]  [place], [place], [address] Phone: [phone]   Fax:  [phone]  Name: VERSA CRATON MRN: [id number] Date of Birth: [birth year]  PHYSICAL THERAPY DISCHARGE SUMMARY  Visits from Start of Care: 22  Current functional level related to goals / functional outcomes: See above clinical impression; patient did not return during 30 day hold period   Remaining deficits: Difficulty with  transfers and bed mobility    Education / Equipment: HEP  Plan: Patient agrees to discharge.  Patient goals were partially met. Patient is being discharged due to being pleased with the current functional level.  ?????     Janene Harvey, PT, DPT 02/28/20 2:13 PM

## 2020-09-10 ENCOUNTER — Other Ambulatory Visit (HOSPITAL_BASED_OUTPATIENT_CLINIC_OR_DEPARTMENT_OTHER): Payer: Self-pay | Admitting: Internal Medicine

## 2020-09-10 ENCOUNTER — Ambulatory Visit: Payer: Medicare Other | Attending: Internal Medicine

## 2020-09-10 DIAGNOSIS — Z23 Encounter for immunization: Secondary | ICD-10-CM

## 2020-09-10 MED FILL — PFIZER-BIONTECH COVID-19 VA: 30 | 21 days supply | Qty: 0 | Fill #0

## 2020-12-02 DIAGNOSIS — R053 Chronic cough: Secondary | ICD-10-CM | POA: Insufficient documentation

## 2021-06-11 ENCOUNTER — Emergency Department (HOSPITAL_BASED_OUTPATIENT_CLINIC_OR_DEPARTMENT_OTHER): Payer: Medicare Other

## 2021-06-11 ENCOUNTER — Encounter (HOSPITAL_BASED_OUTPATIENT_CLINIC_OR_DEPARTMENT_OTHER): Payer: Self-pay | Admitting: *Deleted

## 2021-06-11 ENCOUNTER — Other Ambulatory Visit: Payer: Self-pay

## 2021-06-11 ENCOUNTER — Emergency Department (HOSPITAL_BASED_OUTPATIENT_CLINIC_OR_DEPARTMENT_OTHER)
Admission: EM | Admit: 2021-06-11 | Discharge: 2021-06-11 | Disposition: A | Discharge status: Home / Self Care | Payer: Medicare Other | Attending: Student | Admitting: Student

## 2021-06-11 DIAGNOSIS — Z7982 Long term (current) use of aspirin: Secondary | ICD-10-CM | POA: Diagnosis not present

## 2021-06-11 DIAGNOSIS — I2510 Atherosclerotic heart disease of native coronary artery without angina pectoris: Secondary | ICD-10-CM | POA: Insufficient documentation

## 2021-06-11 DIAGNOSIS — M79604 Pain in right leg: Secondary | ICD-10-CM | POA: Diagnosis not present

## 2021-06-11 DIAGNOSIS — R1031 Right lower quadrant pain: Secondary | ICD-10-CM | POA: Diagnosis not present

## 2021-06-11 DIAGNOSIS — R0789 Other chest pain: Secondary | ICD-10-CM

## 2021-06-11 DIAGNOSIS — M79605 Pain in left leg: Secondary | ICD-10-CM | POA: Diagnosis not present

## 2021-06-11 DIAGNOSIS — R1011 Right upper quadrant pain: Secondary | ICD-10-CM | POA: Insufficient documentation

## 2021-06-11 DIAGNOSIS — R079 Chest pain, unspecified: Secondary | ICD-10-CM | POA: Insufficient documentation

## 2021-06-11 DIAGNOSIS — M792 Neuralgia and neuritis, unspecified: Secondary | ICD-10-CM

## 2021-06-11 DIAGNOSIS — N3090 Cystitis, unspecified without hematuria: Secondary | ICD-10-CM

## 2021-06-11 LAB — CBC
HCT: 44.7 % (ref 36.0–46.0)
Hemoglobin: 14.7 g/dL (ref 12.0–15.0)
MCH: 28 pg (ref 26.0–34.0)
MCHC: 32.9 g/dL (ref 30.0–36.0)
MCV: 85.1 fL (ref 80.0–100.0)
Platelets: 212 10*3/uL (ref 150–400)
RBC: 5.25 MIL/uL — ABNORMAL HIGH (ref 3.87–5.11)
RDW: 14.9 % (ref 11.5–15.5)
WBC: 4.4 10*3/uL (ref 4.0–10.5)
nRBC: 0 % (ref 0.0–0.2)

## 2021-06-11 LAB — URINALYSIS, ROUTINE W REFLEX MICROSCOPIC
Bilirubin Urine: NEGATIVE
Glucose, UA: NEGATIVE mg/dL
Ketones, ur: NEGATIVE mg/dL
Nitrite: POSITIVE — AB
Protein, ur: NEGATIVE mg/dL
Specific Gravity, Urine: 1.005 (ref 1.005–1.030)
pH: 6 (ref 5.0–8.0)

## 2021-06-11 LAB — URINALYSIS, MICROSCOPIC (REFLEX)

## 2021-06-11 LAB — TROPONIN I (HIGH SENSITIVITY)
Troponin I (High Sensitivity): 4 ng/L (ref <18)
Troponin I (High Sensitivity): 5 ng/L (ref <18)

## 2021-06-11 LAB — BASIC METABOLIC PANEL
Anion gap: 6 (ref 5–15)
BUN: 18 mg/dL (ref 8–23)
CO2: 29 mmol/L (ref 22–32)
Calcium: 9.9 mg/dL (ref 8.9–10.3)
Chloride: 102 mmol/L (ref 98–111)
Creatinine, Ser: 0.79 mg/dL (ref 0.44–1.00)
GFR, Estimated: >60 mL/min (ref >60)
Glucose, Bld: 108 mg/dL — ABNORMAL HIGH (ref 70–99)
Potassium: 4 mmol/L (ref 3.5–5.1)
Sodium: 137 mmol/L (ref 135–145)

## 2021-06-11 LAB — D-DIMER, QUANTITATIVE: D-Dimer, Quant: 0.31 ug/mL-FEU (ref 0.00–0.50)

## 2021-06-11 MED ORDER — CEFADROXIL 500 MG PO CAPS: 500.0000 mg | ORAL_CAPSULE | Freq: Two times a day (BID) | ORAL | Status: DC

## 2021-06-11 MED ORDER — CEPHALEXIN 250 MG PO CAPS: 250.0000 mg | ORAL_CAPSULE | Freq: Once | ORAL | Status: DC

## 2021-06-11 MED ORDER — GABAPENTIN 300 MG PO CAPS
300.0000 mg | ORAL_CAPSULE | Freq: Once | ORAL | Status: AC
  Administered 2021-06-11: 300 mg via ORAL
  Filled 2021-06-11: qty 1

## 2021-06-11 MED ORDER — CEFADROXIL 500 MG PO CAPS: 500.0000 mg | ORAL_CAPSULE | Freq: Two times a day (BID) | ORAL | 0 refills | Status: AC

## 2021-06-11 MED ORDER — IOHEXOL 350 MG/ML SOLN
85.0000 mL | Freq: Once | INTRAVENOUS | Status: AC | PRN
  Administered 2021-06-11: 85 mL via INTRAVENOUS

## 2021-06-11 MED ORDER — CEPHALEXIN 250 MG PO CAPS
500.0000 mg | ORAL_CAPSULE | Freq: Once | ORAL | Status: AC
  Administered 2021-06-11: 500 mg via ORAL
  Filled 2021-06-11: qty 2

## 2021-06-11 MED ORDER — GABAPENTIN 300 MG PO CAPS: 300.0000 mg | ORAL_CAPSULE | Freq: Three times a day (TID) | ORAL | 0 refills | Status: DC

## 2021-06-11 NOTE — ED Provider Notes (Signed)
[facility] EMERGENCY DEPARTMENT Provider Note   CSN: [id number] Arrival date & time: 06/11/21  1731     History Chief Complaint  Patient presents with   Chest Pain    Heather Lutz is a 75 y.o. female with PMH multiple sclerosis, blindness, osteoporosis, nephrolithiasis who presents the emergency department for evaluation of multiple complaints which include chest pain, bilateral lower extremity pain.  Patient states that she has had chest pain and bilateral leg throbbing for the last 2 days.  She states that the pain started in her feet and is now progressed upwards into the chest, throbbing in intensity.  She states she also has a unrelated constant right-sided chest pain that is not associated with nausea, vomiting, diaphoresis or worse with exertion.  Denies shortness of breath, cough, headache, fever or other systemic symptoms.  Patient also endorses intermittent right upper and lower abdominal pain.   Chest Pain Associated symptoms: abdominal pain   Associated symptoms: no back pain, no cough, no fever, no palpitations, no shortness of breath and no vomiting       Past Medical History:  Diagnosis Date   Blindness    GERD (gastroesophageal reflux disease)    Multiple sclerosis ([place])    Osteoporosis     Patient Active Problem List   Diagnosis Date Noted   Elevated troponin 07/13/2018   LBBB (left bundle branch block) 07/13/2018   LVH (left ventricular hypertrophy) 07/13/2018   Coronary artery calcification seen on CAT scan 07/13/2018   Bacteremia due to Enterococcus 07/02/2018   E. coli UTI 07/02/2018   Iron deficiency anemia 07/02/2018   Hypomagnesemia 07/02/2018   Hypocalcemia 07/02/2018   Hypokalemia 07/02/2018   Coronary artery disease 07/02/2018   H/O eye surgery 07/02/2018   Abdominal pain    Acute lower UTI 06/25/2018   Sepsis ([place]) 06/25/2018   Acute cystitis with hematuria    Venous stasis 11/20/2014   Lung nodule < 6cm on CT 11/20/2014    Left lower lobe pneumonia 11/17/2014   Influenza A (H1N1) 11/06/2012   Hematemesis 11/06/2012   CAP (community acquired pneumonia) 11/05/2012   Hypoxemia 11/05/2012   SOB (shortness of breath) 11/05/2012   Multiple sclerosis ([place]) 11/05/2012    Past Surgical History:  Procedure Laterality Date   ABDOMINAL HYSTERECTOMY     CYSTOSCOPY WITH RETROGRADE PYELOGRAM, URETEROSCOPY AND STENT PLACEMENT Left 08/01/2018   Procedure: CYSTOSCOPY WITH RETROGRADE PYELOGRAM, URETEROSCOPY, HOLMIUM LASER AND STENT PLACEMENT;  Surgeon: Lucas Mallow, MD;  Location: [facility];  Service: Urology;  Laterality: Left;   ESOPHAGOGASTRODUODENOSCOPY N/A 11/06/2012   Procedure: ESOPHAGOGASTRODUODENOSCOPY (EGD);  Surgeon: Lear Ng, MD;  Location: [facility] ENDOSCOPY;  Service: Endoscopy;  Laterality: N/A;     OB History   No obstetric history on file.     Family History  Problem Relation Age of Onset   CAD Father    Heart attack Neg Hx     Social History   Tobacco Use   Smoking status: Never   Smokeless tobacco: Never  Vaping Use   Vaping Use: Never used  Substance Use Topics   Alcohol use: No    Alcohol/week: 0.0 standard drinks   Drug use: No    Home Medications Prior to Admission medications   Medication Sig Start Date End Date Taking? Authorizing Provider  aspirin 81 MG chewable tablet Chew 1 tablet (81 mg total) by mouth daily. 06/29/18   Thurnell Lose, MD  baclofen (LIORESAL) 10 MG tablet Take 1  tablet (10 mg total) by mouth 3 (three) times daily as needed for muscle spasms. 09/22/17   Davonna Belling, MD  ciprofloxacin (CIPRO) 500 MG tablet Take 1 tablet (500 mg total) by mouth 2 (two) times daily. 08/01/18   Lucas Mallow, MD  COVID-19 mRNA vaccine, Pfizer, 30 MCG/0.3ML injection INJECT AS DIRECTED 09/10/20 09/10/21  Carlyle Basques, MD  ferrous sulfate 325 (65 FE) MG tablet Take 1 tablet (325 mg total) by mouth 2 (two) times daily with a meal. Patient not taking: Reported  on 07/27/2018 06/29/18   Thurnell Lose, MD  naproxen sodium (ALEVE) 220 MG tablet Take 220 mg by mouth 2 (two) times daily as needed.    [provider]  pantoprazole (PROTONIX) 40 MG tablet Take 1 tablet (40 mg total) by mouth daily. Patient not taking: Reported on 07/27/2018 06/29/18   Thurnell Lose, MD  polyethylene glycol [facility] / GLYCOLAX) packet Take 17 g by mouth daily. Patient not taking: Reported on 07/27/2018 09/21/17   Davonna Belling, MD    Allergies    Other and Epinephrine  Review of Systems   Review of Systems  Constitutional:  Negative for chills and fever.  HENT:  Negative for ear pain and sore throat.   Eyes:  Negative for pain and visual disturbance.  Respiratory:  Negative for cough and shortness of breath.   Cardiovascular:  Positive for chest pain. Negative for palpitations.  Gastrointestinal:  Positive for abdominal pain. Negative for vomiting.  Genitourinary:  Negative for dysuria and hematuria.  Musculoskeletal:  Positive for arthralgias and myalgias. Negative for back pain.  Skin:  Negative for color change and rash.  Neurological:  Negative for seizures and syncope.  All other systems reviewed and are negative.  Physical Exam Updated Vital Signs BP (!) 184/78   Pulse 71   Temp 98 F (36.7 C) (Oral)   Resp 16   Ht 5\' 3"  (1.6 m)   Wt 72.6 kg   SpO2 100%   BMI 28.34 kg/m   Physical Exam Vitals and nursing note reviewed.  Constitutional:      General: She is not in acute distress.    Appearance: She is well-developed.  HENT:     Head: Normocephalic and atraumatic.  Eyes:     Conjunctiva/sclera: Conjunctivae normal.  Cardiovascular:     Rate and Rhythm: Normal rate and regular rhythm.     Heart sounds: No murmur heard. Pulmonary:     Effort: Pulmonary effort is normal. No respiratory distress.     Breath sounds: Normal breath sounds.  Abdominal:     Palpations: Abdomen is soft.     Tenderness: There is no abdominal  tenderness.  Musculoskeletal:     Cervical back: Neck supple.  Skin:    General: Skin is warm and dry.  Neurological:     Mental Status: She is alert.    ED Results / Procedures / Treatments   Labs (all labs ordered are listed, but only abnormal results are displayed) Labs Reviewed  BASIC METABOLIC PANEL - Abnormal; Notable for the following components:      Result Value   Glucose, Bld 108 (*)    All other components within normal limits  CBC - Abnormal; Notable for the following components:   RBC 5.25 (*)    All other components within normal limits  TROPONIN I (HIGH SENSITIVITY)    EKG None  Radiology DG Chest 2 View  Result Date: 06/11/2021 CLINICAL DATA:  Chest pain  and bilateral leg throbbing for 2 days. EXAM: CHEST - 2 VIEW COMPARISON:  October 21, 2020. FINDINGS: EKG leads project over the chest. Trachea is midline. Cardiomediastinal contours and hilar structures are normal. Lungs are clear. No effusion or consolidation. Hiatal hernia projects in the retrocardiac region on both AP and lateral views similar appearance to prior imaging. No visible pneumothorax. On limited assessment there is no acute skeletal process. IMPRESSION: No active cardiopulmonary disease. Moderately large hiatal hernia. Electronically Signed   By: Zetta Bills M.D.   On: 06/11/2021 18:33    Procedures Procedures   Medications Ordered in ED Medications - No data to display  ED Course  I have reviewed the triage vital signs and the nursing notes.  Pertinent labs & imaging results that were available during my care of the patient were reviewed by me and considered in my medical decision making (see chart for details).    MDM Rules/Calculators/A&P                           Patient seen emergency department for evaluation of leg pain, chest pain, abdominal pain.  Physical exam largely unremarkable.  Laboratory evaluation with an unremarkable CBC, Chem-7 and D-dimer.  High-sensitivity troponin  and delta troponin unremarkable.  ECG nonischemic.  Urinalysis reveals positive nitrites, moderate leuk esterase, 11-20 white blood cells, 21-50 red blood cells and many bacteria.  Chest x-ray unremarkable.  CT abdomen pelvis showing multiple bilateral kidney stones with no evidence of a obstructing ureteral stone but does show evidence of a developing soft tissue density and urothelial thickening at the right renal pelvis and proximal ureter.  Patient was given gabapentin which improved her throbbing leg pain and chest pain.  She was given a dose of Keflex here in the emergency department and was discharged with a prescription for [facility] as the patient's last urine culture was pansensitive.  She was instructed to follow-up with her primary care physician as her pain appears to be neuropathic and in the setting of multiple sclerosis may require outpatient neurology follow-up.  Patient says she has not been on any medication for multiple sclerosis for over a decade and as she is currently unable to walk, she may require MRI imaging if outpatient providers believe this to be necessary.  Patient then discharged. Final Clinical Impression(s) / ED Diagnoses Final diagnoses:  None    Rx / DC Orders ED Discharge Orders     None        Rileyann Florance, Debe Coder, MD 06/11/21 2330

## 2021-06-11 NOTE — ED Triage Notes (Signed)
C/o chest pain and bil leg throbbing x 2 days

## 2021-06-11 NOTE — ED Notes (Signed)
Delay in bloodwork, pt at XR

## 2021-06-11 NOTE — Discharge Instructions (Addendum)
You were seen in the emergency department for evaluation of chest pain and bilateral pulsating leg pain.  Your work-up in the emergency department was reassuringly negative for any signs of blood clots or a cardiac problem.  At this time you are safe for discharge.  Your CAT scan does show a new inflammation around the renal pole on the right and you should discuss this finding with your urologist.  I am concerned about your inability to walk and history of multiple sclerosis, and I am sure your primary care physician is well aware of this problem, but if not, when following up with your primary care doctor, please ask them if they feel a neurology follow-up is necessary.  At this time you are safe for discharge.  Return the emergency department if you have new or worsening chest pain, shortness of breath, vomiting, fever or any other concerning symptoms.  I have provided a prescription for gabapentin today, please take this as needed for pain.  Of note, your urine does show evidence of infection and you will be discharged with antibiotics.  Please take these antibiotics and finish the entire prescription.

## 2021-06-11 NOTE — ED Notes (Signed)
Provider at bedside

## 2021-07-01 ENCOUNTER — Encounter: Payer: Self-pay | Admitting: Neurology

## 2021-07-01 ENCOUNTER — Ambulatory Visit: Payer: Medicare Other | Admitting: Neurology

## 2021-07-01 VITALS — BP 146/73 | HR 60 | Ht 63.0 in

## 2021-07-01 DIAGNOSIS — H548 Legal blindness, as defined in USA: Secondary | ICD-10-CM | POA: Diagnosis not present

## 2021-07-01 DIAGNOSIS — G35 Multiple sclerosis: Secondary | ICD-10-CM | POA: Diagnosis not present

## 2021-07-01 DIAGNOSIS — R208 Other disturbances of skin sensation: Secondary | ICD-10-CM

## 2021-07-01 DIAGNOSIS — R262 Difficulty in walking, not elsewhere classified: Secondary | ICD-10-CM | POA: Insufficient documentation

## 2021-07-01 DIAGNOSIS — R252 Cramp and spasm: Secondary | ICD-10-CM

## 2021-07-01 MED ORDER — GABAPENTIN 300 MG PO CAPS: 300.0000 mg | ORAL_CAPSULE | Freq: Three times a day (TID) | ORAL | 5 refills | Status: DC

## 2021-07-01 NOTE — Progress Notes (Signed)
[facility]  PATIENT: CHIANTI GOH DOB: Jan 25, 1946  REFERRING DOCTOR OR PCP: Berton Mount SOURCE: Patient, notes from PCP  _________________________________   HISTORICAL  CHIEF COMPLAINT:  Chief Complaint  Patient presents with   New Patient (Initial Visit)    New room. Paper referral from Adline Mango, MD for MS. Dx in early 90's for MS. Never been on DMT. Reports throbbing nerve pain from toes up to waist intermittently. This is what brought her to the ER recently. Occurs more during the night. In [place]. Unable to stand. Classified legally blind in 2018. Has hx optic neuritis in R eye. Lost vision for about 3 months in the 80's and then came back. No contacts/glasses. Has had cataract removed in L eye.     HISTORY OF PRESENT ILLNESS:  I had the pleasure of seeing your patient, Heran Campau, at the [facility] for neurologic consultation regarding her multiple sclerosis.  She is a 75 year old woman who was diagnosed with MS about 30 years ago and had her prior symptoms in the mid 1980s.     A few weeks ago, she was experiencing pain form the toes up to the abdomen with a squeezing pain.    She was started on gabapentin a few days  but stopped since there was not an immediate benefit.  Since then, pain has improved some.  She has experienced similar pain, though less intense in the past.     She has chronic impairments from her multiple sclerosis.  She is unable to walk, even with a walker.  She is unable to bear her weight but momentarily can try to assist with transfers.  She is unable to transfer independently.  She has severe right greater than left leg weakness.  Arms are fairly strong.  She has  spasticity, sometimes bad enough to cause the leg to straighten up completely, usually with movements.  .   She has baclofen but usually ust takes one at bedtime.     She has had progressive visual loss.   She had severe optic neuritis  OD in 1986 and vision took 3 months to improve.    She is legally blind bilaterally as she also had severe glaucoma.  She has frequent UTIs though denies hesitancy or retention.    She denies much  fatigue and sleeps well most nights.    Mood is doing well.   Cognition is fine.      She has never been on a disease modifying therapy and she has been purely progressive for at least 10 years.  I saw her for a few visits when I was at [facility] between 2010 and 2015.  MS History: She was diagnosed with MS in the mid 1990, but  presented with right optic neuritis in 1986 followed by numbness form the waist down (strength was normal), also in the 1980's.   In the 1990's she had more dysesthesias in her torso and had an MRI c/w MS.   She has been in a wheelchair since 2018.  Before that she used a rollator x many years.   She had seen Dr. Jacqulynn Cadet x years and I saw her a couple times > 10 years ago.      REVIEW OF SYSTEMS: Constitutional: No fevers, chills, sweats, or change in appetite Eyes: She is blind  ear, nose and throat: No hearing loss, ear pain, nasal congestion, sore throat Cardiovascular: No chest pain, palpitations Respiratory:  No shortness of  breath at rest or with exertion.   No wheezes GastrointestinaI: No nausea, vomiting, diarrhea, abdominal pain, fecal incontinence Genitourinary: Urinary urge incontinence. Musculoskeletal:  No neck pain, back pain Integumentary: No rash, pruritus, skin lesions Neurological: as above Psychiatric: No depression at this time.  No anxiety Endocrine: No palpitations, diaphoresis, change in appetite, change in weigh or increased thirst Hematologic/Lymphatic:  No anemia, purpura, petechiae. Allergic/Immunologic: No itchy/runny eyes, nasal congestion, recent allergic reactions, rashes  ALLERGIES: Allergies  Allergen Reactions   Other Itching    Walnuts, honeydew, watermelon, canteloupe, bananas   Epinephrine Anxiety and Other (See Comments)     Hallucinations     HOME MEDICATIONS:  Current Outpatient Medications:    acetaminophen (TYLENOL) 325 MG tablet, Take 650 mg by mouth every 6 (six) hours as needed., Disp: , Rfl:    baclofen (LIORESAL) 10 MG tablet, Take 1 tablet (10 mg total) by mouth 3 (three) times daily as needed for muscle spasms. (Patient taking differently: Take 10 mg by mouth as needed for muscle spasms.), Disp: 10 each, Rfl: 0   meloxicam (MOBIC) 15 MG tablet, Take 15 mg by mouth as needed., Disp: , Rfl:    naproxen sodium (ALEVE) 220 MG tablet, Take 220 mg by mouth 2 (two) times daily as needed., Disp: , Rfl:    gabapentin (NEURONTIN) 300 MG capsule, Take 1 capsule (300 mg total) by mouth 3 (three) times daily., Disp: 90 capsule, Rfl: 5  PAST MEDICAL HISTORY: Past Medical History:  Diagnosis Date   Blindness    GERD (gastroesophageal reflux disease)    Legally blind 2018   Multiple sclerosis ([place])    Osteoporosis     PAST SURGICAL HISTORY: Past Surgical History:  Procedure Laterality Date   ABDOMINAL HYSTERECTOMY     CATARACT EXTRACTION Left    CYSTOSCOPY WITH RETROGRADE PYELOGRAM, URETEROSCOPY AND STENT PLACEMENT Left 08/01/2018   Procedure: CYSTOSCOPY WITH RETROGRADE PYELOGRAM, URETEROSCOPY, HOLMIUM LASER AND STENT PLACEMENT;  Surgeon: Lucas Mallow, MD;  Location: [facility];  Service: Urology;  Laterality: Left;   ESOPHAGOGASTRODUODENOSCOPY N/A 11/06/2012   Procedure: ESOPHAGOGASTRODUODENOSCOPY (EGD);  Surgeon: Lear Ng, MD;  Location: [facility] ENDOSCOPY;  Service: Endoscopy;  Laterality: N/A;   KIDNEY STONE SURGERY      FAMILY HISTORY: Family History  Problem Relation Age of Onset   CAD Father    Heart attack Neg Hx     SOCIAL HISTORY:  Social History   Socioeconomic History   Marital status: Married    Spouse name: Not on file   Number of children: Not on file   Years of education: Not on file   Highest education level: Not on file  Occupational History   Not on file  Tobacco  Use   Smoking status: Never   Smokeless tobacco: Never  Vaping Use   Vaping Use: Never used  Substance and Sexual Activity   Alcohol use: No    Alcohol/week: 0.0 standard drinks   Drug use: No   Sexual activity: Not on file  Other Topics Concern   Not on file  Social History Narrative   Right handed   Caffeine use: pepsi sometimes   Social Determinants of Health   Financial Resource Strain: Not on file  Food Insecurity: Not on file  Transportation Needs: Not on file  Physical Activity: Not on file  Stress: Not on file  Social Connections: Not on file  Intimate Partner Violence: Not on file     PHYSICAL EXAM  Vitals:  07/01/21 1309  BP: (!) 146/73  Pulse: 60  SpO2: 96%  Height: 5\' 3"  (1.6 m)    Body mass index is 28.34 kg/m.   General: The patient is well-developed and well-nourished and in no acute distress  HEENT:  Head is [place]/AT.  Sclera are anicteric.    Neck: No carotid bruits are noted.  The neck is nontender.  Cardiovascular: The heart has a regular rate and rhythm with a normal S1 and S2. There were no murmurs, gallops or rubs.    Skin: Extremities are without rash or  edema.  Musculoskeletal:  Back is nontender  Neurologic Exam  Mental status: The patient is alert and oriented x 3 at the time of the examination. The patient has apparent normal recent and remote memory, with an apparently normal attention span and concentration ability.   Speech is normal.  Cranial nerves: Extraocular movements are full. Pupils are equal, round, and reactive to light and accomodation.  Visual fields are full.  Facial symmetry is present. There is good facial sensation to soft touch bilaterally.Facial strength is normal.  Trapezius and sternocleidomastoid strength is normal. No dysarthria is noted.  The tongue is midline, and the patient has symmetric elevation of the soft palate. No obvious hearing deficits are noted.  Motor:  Muscle bulk is normal.   Tone is  increased in her legs, right greater than left. Strength is  5 / 5 in the arms, 3/5 in the left leg and 1 to 2 -/5 in the right leg  Sensory: Sensory testing is intact to pinprick, soft touch and vibration sensation in the arms.  She reports symmetric sensation to touch in the legs.  She has reduced vibration sensation in the right leg relative to the left leg.  Normal sensation in the trunk  Coordination: Cerebellar testing reveals good finger-nose-finger and legs are unable to be tested  Gait and station: She is unable to stand or walk.  Reflexes: Deep tendon reflexes are symmetric and normal bilaterally.   Plantar responses are extensor.    DIAGNOSTIC DATA (LABS, IMAGING, TESTING) - I reviewed patient records, labs, notes, testing and imaging myself where available.  Lab Results  Component Value Date   WBC 4.4 06/11/2021   HGB 14.7 06/11/2021   HCT 44.7 06/11/2021   MCV 85.1 06/11/2021   PLT 212 06/11/2021      Component Value Date/Time   NA 137 06/11/2021 1813   NA 140 07/06/2018 0000   K 4.0 06/11/2021 1813   CL 102 06/11/2021 1813   CO2 29 06/11/2021 1813   GLUCOSE 108 (H) 06/11/2021 1813   BUN 18 06/11/2021 1813   BUN 12 07/06/2018 0000   CREATININE 0.79 06/11/2021 1813   CALCIUM 9.9 06/11/2021 1813   PROT 6.5 06/30/2018 0354   ALBUMIN 2.4 (L) 06/30/2018 0354   AST 24 06/30/2018 0354   ALT 15 06/30/2018 0354   ALKPHOS 87 06/30/2018 0354   BILITOT 0.6 06/30/2018 0354   GFRNONAA >60 06/11/2021 1813   GFRAA >60 08/01/2018 1049   Lab Results  Component Value Date   CHOL 115 06/27/2018   HDL 45 06/27/2018   LDLCALC 52 06/27/2018   TRIG 89 06/27/2018   CHOLHDL 2.6 06/27/2018   No results found for: HGBA1C Lab Results  Component Value Date   FWYOVZCH88 502 06/26/2018   Lab Results  Component Value Date   TSH 1.286 06/25/2018       ASSESSMENT AND PLAN  Multiple sclerosis ([place])  Legally  blind  Unable to walk  Spasticity  Dysesthesia  In  summary, Ms. 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  Unfortunately, immunomodulatory therapy has negligible benefit late in the disease course of the mass when a person is experiencing slow neurodegenerative processes without new activity or relapses..  Therefore she will remain off of a disease modifying therapy.  Her dysesthetic pain is doing better but not completely resolved.  It is most likely caused by thoracic spine plaques.  She only gave the gabapentin about 3 days.  I have asked her to restart the gabapentin and to give it 4 weeks before deciding it is not effective.  If she gets no benefit we could consider lamotrigine or nortriptyline.  We also discussed that baclofen can be taken up to 3 pills a day and that higher dose may help the leg spasticity more.  She will return to see me in 1 year or sooner if there are new or worsening neurologic symptoms.  Thank you for asking me to see Ms. Millis.  Please let me know if I can be of further assistance with her or other patients in the future  Silena Wyss A. Felecia Shelling, MD, [facility] 45/[id number]/4680, 3:21 PM Certified in Neurology, Clinical Neurophysiology, Sleep Medicine and Neuroimaging  [facility] [address] [place], [place] [address] [phone]

## 2021-08-04 ENCOUNTER — Other Ambulatory Visit: Payer: Self-pay

## 2021-08-04 ENCOUNTER — Ambulatory Visit: Payer: Medicare Other | Admitting: Podiatry

## 2021-08-04 ENCOUNTER — Encounter: Payer: Self-pay | Admitting: Podiatry

## 2021-08-04 DIAGNOSIS — B351 Tinea unguium: Secondary | ICD-10-CM | POA: Diagnosis not present

## 2021-08-04 MED ORDER — CICLOPIROX 8 % EX SOLN: Freq: Every day | CUTANEOUS | 0 refills | Status: DC

## 2021-08-04 NOTE — Progress Notes (Signed)
  Subjective:  Patient ID: Heather Lutz, female    DOB: November 08, 1945,   MRN: [id number]  Chief Complaint  Patient presents with   Nail Problem    The two big toenails are thick and discolored and the right big toe is curling in     75 y.o. female presents for concern of fungal nails in both big toes. Here today with husband. Relates nails have been thickened and discolored for many years. Denies any pain. States she has had some injury to the toes in the past. Denies any treatment . Denies any other pedal complaints. Denies n/v/f/c.   Past Medical History:  Diagnosis Date   Blindness    GERD (gastroesophageal reflux disease)    Legally blind 2018   Multiple sclerosis ([facility])    Osteoporosis     Objective:  Physical Exam: Vascular: DP/PT pulses 2/4 bilateral. CFT <3 seconds. Normal hair growth on digits. No edema.  Skin. No lacerations or abrasions bilateral feet. Hallux nails  are thickened discolored and elongated with subungual debris.  Musculoskeletal: MMT 5/5 bilateral lower extremities in DF, PF, Inversion and Eversion. Deceased ROM in DF of ankle joint.  Neurological: Sensation intact to light touch.   Assessment:   1. Onychomycosis      Plan:  Patient was evaluated and treated and all questions answered. -Examined patient -Discussed treatment options for painful dystrophic nails  -Discussed fungal nail treatment options including oral, topical, and laser treatments.  -Hallux nails debrided as courtesy today. -Patient would like to try vicks as well as prescription topical -Prescription for penlac provided.  -Patient to return in 3 months for re-evaluation.   Lorenda Peck, DPM

## 2021-08-25 ENCOUNTER — Other Ambulatory Visit (HOSPITAL_BASED_OUTPATIENT_CLINIC_OR_DEPARTMENT_OTHER): Payer: Self-pay

## 2021-08-25 MED ORDER — FLUAD QUADRIVALENT 0.5 ML IM PRSY
PREFILLED_SYRINGE | INTRAMUSCULAR | 0 refills | Status: DC
  Filled 2021-08-25: qty 0.5, 1d supply, fill #0

## 2021-11-03 ENCOUNTER — Ambulatory Visit: Payer: Medicare Other | Admitting: Podiatry

## 2021-11-10 ENCOUNTER — Ambulatory Visit: Payer: Medicare Other | Admitting: Podiatrist

## 2022-04-28 ENCOUNTER — Encounter (HOSPITAL_BASED_OUTPATIENT_CLINIC_OR_DEPARTMENT_OTHER): Payer: Self-pay

## 2022-04-28 ENCOUNTER — Emergency Department (HOSPITAL_BASED_OUTPATIENT_CLINIC_OR_DEPARTMENT_OTHER): Payer: Medicare Other

## 2022-04-28 ENCOUNTER — Emergency Department (HOSPITAL_BASED_OUTPATIENT_CLINIC_OR_DEPARTMENT_OTHER)
Admission: EM | Admit: 2022-04-28 | Discharge: 2022-04-28 | Disposition: A | Discharge status: Home / Self Care | Payer: Medicare Other | Attending: Emergency Medicine | Admitting: Emergency Medicine

## 2022-04-28 ENCOUNTER — Other Ambulatory Visit: Payer: Self-pay

## 2022-04-28 ENCOUNTER — Other Ambulatory Visit (HOSPITAL_BASED_OUTPATIENT_CLINIC_OR_DEPARTMENT_OTHER): Payer: Self-pay

## 2022-04-28 DIAGNOSIS — R109 Unspecified abdominal pain: Secondary | ICD-10-CM | POA: Diagnosis present

## 2022-04-28 DIAGNOSIS — N200 Calculus of kidney: Secondary | ICD-10-CM

## 2022-04-28 DIAGNOSIS — N3000 Acute cystitis without hematuria: Secondary | ICD-10-CM

## 2022-04-28 DIAGNOSIS — R10A0 Flank pain, unspecified side: Secondary | ICD-10-CM

## 2022-04-28 LAB — COMPREHENSIVE METABOLIC PANEL
ALT: 14 U/L (ref 0–44)
AST: 24 U/L (ref 15–41)
Albumin: 3.7 g/dL (ref 3.5–5.0)
Alkaline Phosphatase: 109 U/L (ref 38–126)
Anion gap: 8 (ref 5–15)
BUN: 14 mg/dL (ref 8–23)
CO2: 26 mmol/L (ref 22–32)
Calcium: 9.6 mg/dL (ref 8.9–10.3)
Chloride: 106 mmol/L (ref 98–111)
Creatinine, Ser: 0.75 mg/dL (ref 0.44–1.00)
GFR, Estimated: >60 mL/min (ref >60)
Glucose, Bld: 82 mg/dL (ref 70–99)
Potassium: 3.9 mmol/L (ref 3.5–5.1)
Sodium: 140 mmol/L (ref 135–145)
Total Bilirubin: 0.7 mg/dL (ref 0.3–1.2)
Total Protein: 8.2 g/dL — ABNORMAL HIGH (ref 6.5–8.1)

## 2022-04-28 LAB — CBC WITH DIFFERENTIAL/PLATELET
Abs Immature Granulocytes: 0 10*3/uL (ref 0.00–0.07)
Basophils Absolute: 0 10*3/uL (ref 0.0–0.1)
Basophils Relative: 1 %
Eosinophils Absolute: 0.1 10*3/uL (ref 0.0–0.5)
Eosinophils Relative: 2 %
HCT: 44.3 % (ref 36.0–46.0)
Hemoglobin: 14.4 g/dL (ref 12.0–15.0)
Immature Granulocytes: 0 %
Lymphocytes Relative: 27 %
Lymphs Abs: 1.1 10*3/uL (ref 0.7–4.0)
MCH: 27.7 pg (ref 26.0–34.0)
MCHC: 32.5 g/dL (ref 30.0–36.0)
MCV: 85.4 fL (ref 80.0–100.0)
Monocytes Absolute: 0.4 10*3/uL (ref 0.1–1.0)
Monocytes Relative: 10 %
Neutro Abs: 2.4 10*3/uL (ref 1.7–7.7)
Neutrophils Relative %: 60 %
Platelets: 215 10*3/uL (ref 150–400)
RBC: 5.19 MIL/uL — ABNORMAL HIGH (ref 3.87–5.11)
RDW: 14.5 % (ref 11.5–15.5)
WBC: 4 10*3/uL (ref 4.0–10.5)
nRBC: 0 % (ref 0.0–0.2)

## 2022-04-28 LAB — URINALYSIS, ROUTINE W REFLEX MICROSCOPIC
Bilirubin Urine: NEGATIVE
Glucose, UA: NEGATIVE mg/dL
Ketones, ur: 15 mg/dL — AB
Nitrite: POSITIVE — AB
Protein, ur: 100 mg/dL — AB
Specific Gravity, Urine: 1.01 (ref 1.005–1.030)
pH: 6.5 (ref 5.0–8.0)

## 2022-04-28 LAB — URINALYSIS, MICROSCOPIC (REFLEX): WBC, UA: >50 WBC/hpf (ref 0–5)

## 2022-04-28 LAB — LIPASE, BLOOD: Lipase: 26 U/L (ref 11–51)

## 2022-04-28 MED ORDER — CEFDINIR 300 MG PO CAPS
300.0000 mg | ORAL_CAPSULE | Freq: Two times a day (BID) | ORAL | 0 refills | Status: DC
  Filled 2022-04-28: qty 20, 10d supply, fill #0

## 2022-04-28 MED ORDER — IOHEXOL 300 MG/ML  SOLN
100.0000 mL | Freq: Once | INTRAMUSCULAR | Status: AC | PRN
  Administered 2022-04-28: 100 mL via INTRAVENOUS

## 2022-04-28 MED ORDER — SODIUM CHLORIDE 0.9 % IV SOLN
1.0000 g | Freq: Once | INTRAVENOUS | Status: AC
  Administered 2022-04-28: 1 g via INTRAVENOUS
  Filled 2022-04-28: qty 10

## 2022-04-28 MED ORDER — SODIUM CHLORIDE 0.9 % IV SOLN: INTRAVENOUS | Status: DC | PRN

## 2022-04-28 NOTE — ED Notes (Signed)
Called lab to add urine culture onto previously collected urine specimen

## 2022-04-28 NOTE — ED Triage Notes (Signed)
Pt c/o lower abdominal pain, urinary frequency and burning with urination. Hx of known kidney stones, scheduling a procedure to get stones removed.

## 2022-04-28 NOTE — Discharge Instructions (Signed)
Please read and follow all provided instructions.  Your diagnoses today include:  1. Acute cystitis without hematuria   2. Flank pain   3. Bilateral nephrolithiasis     Tests performed today include: Blood cell counts and platelets: Infection fighting cells and red blood cells were normal Kidney and liver function tests: Kidney testing was normal Pancreas function test (called lipase) Urine test to look for infection: Suggests infection CT of the abdomen pelvis: Does not show any complications from the kidney stones today or other problems in the abdomen.  Vital signs. See below for your results today.   Medications prescribed:  Cefdinir: Antibiotic for infection  Take any prescribed medications only as directed.  Home care instructions:  Follow any educational materials contained in this packet.  Follow-up instructions:  I spoke with your urology group today.  They should be reaching out to you in regards to a follow-up appointment.  Please call tomorrow if you do not hear from them.  Return instructions:  SEEK IMMEDIATE MEDICAL ATTENTION IF: The pain does not go away or becomes severe  A temperature above 101F develops  Repeated vomiting occurs (multiple episodes)  The pain becomes localized to portions of the abdomen. The right side could possibly be appendicitis. In an adult, the left lower portion of the abdomen could be colitis or diverticulitis.  Blood is being passed in stools or vomit (bright red or black tarry stools)  You develop chest pain, difficulty breathing, dizziness or fainting, or become confused, poorly responsive, or inconsolable (young children) If you have any other emergent concerns regarding your health  Additional Information: Abdominal (belly) pain can be caused by many things. Your caregiver performed an examination and possibly ordered blood/urine tests and imaging (CT scan, x-rays, ultrasound). Many cases can be observed and treated at home after  initial evaluation in the emergency department. Even though you are being discharged home, abdominal pain can be unpredictable. Therefore, you need a repeated exam if your pain does not resolve, returns, or worsens. Most patients with abdominal pain don't have to be admitted to the hospital or have surgery, but serious problems like appendicitis and gallbladder attacks can start out as nonspecific pain. Many abdominal conditions cannot be diagnosed in one visit, so follow-up evaluations are very important.  Your vital signs today were: BP (!) 159/93   Pulse 71   Temp 97.9 F (36.6 C) (Oral)   Resp 19   Ht '5\' 3"'$  (1.6 m)   Wt 69.3 kg   SpO2 98%   BMI 27.07 kg/m  If your blood pressure (bp) was elevated above 135/85 this visit, please have this repeated by your doctor within one month. --------------

## 2022-04-28 NOTE — ED Provider Notes (Addendum)
[facility] EMERGENCY DEPARTMENT Provider Note   CSN: [id number] Arrival date & time: 04/28/22  2542     History  Chief Complaint  Patient presents with   Abdominal Pain    Heather Lutz is a 76 y.o. female.  Patient with history of hiatal hernia, known kidney stones presents to the emergency department today for 2 to 3 days of abdominal pain.  Pain is mainly in the right flank and suprapubic area.  No associated fevers, vomiting, constipation or diarrhea.  She has not recently noted any blood in the urine.  Has had some dysuria and increased frequency.  States that she has been told by her urologist that she has had stones in each kidney and that she had 2 that needed to be removed.  She describes the suprapubic pain as dull in the right flank pain as sharp at times.       Home Medications Prior to Admission medications   Medication Sig Start Date End Date Taking? Authorizing Provider  acetaminophen (TYLENOL) 325 MG tablet Take 650 mg by mouth every 6 (six) hours as needed.    [provider]  baclofen (LIORESAL) 10 MG tablet Take 1 tablet (10 mg total) by mouth 3 (three) times daily as needed for muscle spasms. Patient taking differently: Take 10 mg by mouth as needed for muscle spasms. 09/22/17   Davonna Belling, MD  baclofen (LIORESAL) 10 MG tablet Take 1 tablet by mouth 3 (three) times daily as needed. 06/20/20   [provider]  brimonidine (ALPHAGAN) 0.2 % ophthalmic solution Apply to eye. 11/15/18   [provider]  carboxymethylcellulose (REFRESH PLUS) 0.5 % SOLN Place 2 drops into both eyes 2 times daily.    [provider]  Cholecalciferol 1.25 MG (50000 UT) capsule Take 1 capsule once weekly. Must schedule appt for additional RF's 07/22/21   [provider]  ciclopirox (PENLAC) 8 % solution Apply topically at bedtime. Apply over nail and surrounding skin. Apply daily over previous coat. After seven (7) days, may  remove with alcohol and continue cycle. 08/04/21   Lorenda Peck, DPM  Cyanocobalamin 500 MCG SUBL Take by mouth.    [provider]  dorzolamide (TRUSOPT) 2 % ophthalmic solution Apply to eye. 11/15/18   [provider]  estradiol (ESTRACE) 0.1 MG/GM vaginal cream Apply 2 gram to vaginal area three times per week. 02/05/21   [provider]  famotidine (PEPCID) 20 MG tablet Take by mouth. 11/08/19   [provider]  ferrous sulfate 325 (65 FE) MG tablet Take 1 tablet by mouth daily with breakfast. 11/06/19   [provider]  gabapentin (NEURONTIN) 100 MG capsule Take by mouth. 09/04/19   [provider]  gabapentin (NEURONTIN) 300 MG capsule Take 1 capsule (300 mg total) by mouth 3 (three) times daily. 07/01/21 12/28/21  Sater, Nanine Means, MD  influenza vaccine adjuvanted (FLUAD QUADRIVALENT) 0.5 ML injection Inject into the muscle. 08/25/21   Carlyle Basques, MD  ipratropium (ATROVENT) 0.06 % nasal spray USE 2 SPRAYS IN EACH NOSTRIL THREE TIMES DAILY FOR 21 DAYS 10/21/20   [provider]  latanoprost (XALATAN) 0.005 % ophthalmic solution Apply to eye. 11/15/18   [provider]  meloxicam (MOBIC) 15 MG tablet Take 15 mg by mouth as needed. 03/11/21   [provider]  mometasone (NASONEX) 50 MCG/ACT nasal spray Place into the nose. 10/21/20 10/21/21  [provider]  naproxen sodium (ALEVE) 220 MG tablet Take 220 mg  by mouth 2 (two) times daily as needed.    [provider]  olopatadine (PATANOL) 0.1 % ophthalmic solution Place 2 drops into both eyes daily.    [provider]  timolol (TIMOPTIC) 0.5 % ophthalmic solution Apply to eye. 11/15/18   [provider]      Allergies    Other and Epinephrine    Review of Systems   Review of Systems  Physical Exam Updated Vital Signs BP (!) 182/87 (BP Location: Right Arm)   Pulse 71   Resp 18   Ht '5\' 3"'$  (1.6 m)   Wt 69.3 kg   SpO2 97%   BMI  27.07 kg/m  Physical Exam Vitals and nursing note reviewed.  Constitutional:      General: She is not in acute distress.    Appearance: She is well-developed.  HENT:     Head: Normocephalic and atraumatic.     Right Ear: External ear normal.     Left Ear: External ear normal.     Nose: Nose normal.  Eyes:     Conjunctiva/sclera: Conjunctivae normal.  Cardiovascular:     Rate and Rhythm: Normal rate and regular rhythm.     Heart sounds: No murmur heard. Pulmonary:     Effort: No respiratory distress.     Breath sounds: No wheezing, rhonchi or rales.  Abdominal:     Palpations: Abdomen is soft.     Tenderness: There is abdominal tenderness in the right lower quadrant and suprapubic area. There is no guarding or rebound.  Musculoskeletal:     Cervical back: Normal range of motion and neck supple.     Right lower leg: No edema.     Left lower leg: No edema.  Skin:    General: Skin is warm and dry.     Findings: No rash.  Neurological:     General: No focal deficit present.     Mental Status: She is alert. Mental status is at baseline.     Motor: No weakness.  Psychiatric:        Mood and Affect: Mood normal.     ED Results / Procedures / Treatments   Labs (all labs ordered are listed, but only abnormal results are displayed) Labs Reviewed  CBC WITH DIFFERENTIAL/PLATELET - Abnormal; Notable for the following components:      Result Value   RBC 5.19 (*)    All other components within normal limits  COMPREHENSIVE METABOLIC PANEL - Abnormal; Notable for the following components:   Total Protein 8.2 (*)    All other components within normal limits  URINALYSIS, ROUTINE W REFLEX MICROSCOPIC - Abnormal; Notable for the following components:   APPearance CLOUDY (*)    Hgb urine dipstick LARGE (*)    Ketones, ur 15 (*)    Protein, ur 100 (*)    Nitrite POSITIVE (*)    Leukocytes,Ua LARGE (*)    All other components within normal limits  URINALYSIS, MICROSCOPIC (REFLEX) -  Abnormal; Notable for the following components:   Bacteria, UA MANY (*)    All other components within normal limits  URINE CULTURE  LIPASE, BLOOD    EKG None  Radiology CT ABDOMEN PELVIS W CONTRAST  Result Date: 04/28/2022 CLINICAL DATA:  76 year old female with flank pain on the right. Suprapubic and right lower quadrant pain. EXAM: CT ABDOMEN AND PELVIS WITH CONTRAST TECHNIQUE: Multidetector CT imaging of the abdomen and pelvis was performed using the standard protocol following bolus administration of intravenous  contrast. RADIATION DOSE REDUCTION: This exam was performed according to the departmental dose-optimization program which includes automated exposure control, adjustment of the mA and/or kV according to patient size and/or use of iterative reconstruction technique. CONTRAST:  169m OMNIPAQUE IOHEXOL 300 MG/ML  SOLN COMPARISON:  Noncontrast CT Abdomen and Pelvis 02/03/2022 and earlier. FINDINGS: Lower chest: Moderate to large chronic hiatal hernia. No pericardial or pleural effusion. Chronic lung base scarring appears stable. Hepatobiliary: Negative liver and gallbladder. Pancreas: Negative. Spleen: Negative. Adrenals/Urinary Tract: Adrenal glands remain within normal limits. Chronically abnormal kidneys with severe right renal urothelial thickening surrounding a chronic 11 mm calculus at the pelvis (coronal image 33). Some associated right hydronephrosis. Additional smaller intrarenal calculi. Confluent inflammatory stranding at the pelvis and ureteropelvic junction, and continuing along the proximal right ureter which also demonstrates severe urothelial thickening and irregularity. Note the mass effect on contrast *FIELD* right renal collecting system on series 7, image 17 of the delayed images. And the proximal right ureter lumen is only thread-like on series 7, image 21. The distal right ureter is decompressed. Similar but less pronounced contralateral findings in the left kidney at the  renal pelvis, ureteropelvic junction. Chronic left intrarenal calculi measure up to 9-10 mm. Benign appearing left renal midpole cyst with simple fluid density (no follow-up imaging recommended). Distal left ureter also appears normal. Abnormal bladder mucosal thickening and enhancement, bilateral on series 2, image 69. Stomach/Bowel: Negative large bowel aside from redundancy and retained stool. Normal appendix arising on series 2, image 50. Negative terminal ileum. No dilated small bowel. Chronic ventral abdominal rectus muscle diastasis. No overt bowel herniation. Chronic moderate to large gastric hiatal hernia. Intra-abdominal stomach and duodenum appear negative. No free air or free fluid. Vascular/Lymphatic: Major arterial structures in the abdomen and pelvis are patent with mild tortuosity but minimal atherosclerosis. Portal venous system grossly patent. No lymphadenopathy identified. Reproductive: Absent uterus.  Diminutive or absent ovaries. Other: No pelvic free fluid.  Chronic pelvic phleboliths. Musculoskeletal: Osteopenia. No acute osseous abnormality identified. IMPRESSION: 1. Severe right > left urothelial abnormality at the renal pelves and UPJs, similar to that demonstrated on CT in May. Carcinoma is not excluded, but there is underlying bulky stone disease. Abnormal bladder mucosal thickening and enhancement re-demonstrated. Distal ureters remain normal. Recommend Urology consultation. 2. No new abnormality identified in the abdomen or pelvis. Normal appendix. Chronic moderate to large hiatal hernia. Electronically Signed   By: HGenevie AnnM.D.   On: 04/28/2022 11:11    Procedures Procedures    Medications Ordered in ED Medications - No data to display  ED Course/ Medical Decision Making/ A&P    Patient seen and examined. History obtained directly from patient. Also reviewed outpatient clinic notes.   Labs/EKG: Ordered CBC, CMP, lipase, UA.  Imaging: Ordered CT abdomen pelvis with  contrast.  Medications/Fluids: Ordered: None ordered.  Most recent vital signs reviewed and are as follows: BP (!) 182/87 (BP Location: Right Arm)   Pulse 71   Resp 18   Ht '5\' 3"'$  (1.6 m)   Wt 69.3 kg   SpO2 97%   BMI 27.07 kg/m   Initial impression: Right lower abdominal pain, suprapubic pain and flank pain  12:08 PM Reassessment performed. Patient appears stable.  Patient discussed with Dr. PJohnney Killianwho will see.  Labs personally reviewed and interpreted including: CBC with normal white blood cell count, normal hemoglobin; CMP normal kidney function and electrolytes; lipase normal.  UA appears to be infected with white blood cells, clumps, nitrite  positive.  Urine culture sent.  Imaging personally visualized and interpreted including: CT abdomen pelvis shows large intrarenal stones, no ureteral stones today.  Reviewed pertinent lab work and imaging with patient at bedside. Questions answered.   I consulted with Dr. Tresa Moore of [facility] by telephone.  Patient was last seen in May.  At that time she was refusing procedure to have the stones addressed.  He agrees that no indication for emergent urologic intervention today.  They will reach out to patient and schedule appointment for follow-up.  Most current vital signs reviewed and are as follows: BP (!) 159/93   Pulse 71   Temp 97.9 F (36.6 C) (Oral)   Resp 19   Ht '5\' 3"'$  (1.6 m)   Wt 69.3 kg   SpO2 98%   BMI 27.07 kg/m   Plan: IV Rocephin, will place on pyelonephritis coverage for home and have her follow-up closely as outpatient.  12:47 PM Reassessment performed. Patient appears stable.  Reviewed pertinent lab work and imaging with patient at bedside. Questions answered.   Most current vital signs reviewed and are as follows: BP (!) 159/93   Pulse 71   Temp 97.9 F (36.6 C) (Oral)   Resp 19   Ht '5\' 3"'$  (1.6 m)   Wt 69.3 kg   SpO2 98%   BMI 27.07 kg/m   Plan: Discharge to home.   Prescriptions written  for: Cefdinir x10 days  Other home care instructions discussed: OTC meds for pain, maintain good hydration  ED return instructions discussed: The patient was urged to return to the Emergency Department immediately with worsening of current symptoms, worsening abdominal pain, persistent vomiting, blood noted in stools, fever, or any other concerns. The patient verbalized understanding.   Follow-up instructions discussed: Patient encouraged to follow-up with their urologist in 3 days.                           Medical Decision Making Amount and/or Complexity of Data Reviewed Labs: ordered. Radiology: ordered.  Risk Prescription drug management.   For this patient's complaint of abdominal pain, the following conditions were considered on the differential diagnosis: gastritis/PUD, enteritis/duodenitis, appendicitis, cholelithiasis/cholecystitis, cholangitis, pancreatitis, ruptured viscus, colitis, diverticulitis, small/large bowel obstruction, proctitis, cystitis, pyelonephritis, ureteral colic, aortic dissection, aortic aneurysm. Atypical chest etiologies were also considered including ACS, PE, and pneumonia.   Patient does have intrarenal stones with signs of UTI today.  No obstructive ureteral stones.  Urology recommendations obtained.  Patient follow-up as outpatient.  Low concern for acute pyelonephritis today given normal white blood cell count, no fever, no vomiting.  The patient's vital signs, pertinent lab work and imaging were reviewed and interpreted as discussed in the ED course. Hospitalization was considered for further testing, treatments, or serial exams/observation. However as patient is well-appearing, has a stable exam, and reassuring studies today, I do not feel that they warrant admission at this time. This plan was discussed with the patient who verbalizes agreement and comfort with this plan and seems reliable and able to return to the Emergency Department with worsening or  changing symptoms.    Final Clinical Impression(s) / ED Diagnoses Final diagnoses:  Acute cystitis without hematuria  Flank pain  Bilateral nephrolithiasis    Rx / DC Orders ED Discharge Orders          Ordered    cefdinir (OMNICEF) 300 MG capsule  2 times daily        04/28/22  [place], Diana Davenport, PA-C 04/28/22 1245    Carlisle Cater, PA-C 04/28/22 1247    Charlesetta Shanks, MD 04/28/22 [phone]

## 2022-04-28 NOTE — ED Notes (Signed)
Patient is resting comfortably. Skin is warm and dry with no obvious abnormalities. 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 Lungs are clear, equal and bilateral. Throat is clear. Abdomen is soft and non-distended. States ABD pain intermittently but is pain free at this time.

## 2022-04-28 NOTE — ED Provider Notes (Signed)
I provided a substantive portion of the care of this patient.  I personally performed the entirety of the history for this encounter.    Has known chronic kidney stones.  She has had several days of increasing abdominal pain and discomfort.  Discomfort is achy and suprapubic with some right flank pain.  No documented fever.  Is alert.  She is answering questions appropriately.  No respiratory distress.  Abdomen is soft without guarding.  Mild right-sided discomfort but not severe.  No significant peripheral edema.  I have reviewed the CT readings per radiology, urinalysis and labs.  At this time with complex kidney stones and thickening but no obvious obstruction, recommend consult with urology for input.  Patient is nontoxic with stable vital signs and stable for discharge.  Currently no SIRS symptoms and pain controlled.  Agree with plan of management.   Charlesetta Shanks, MD 04/28/22 1635

## 2022-04-28 NOTE — ED Notes (Signed)
Pt provided with coffee and peanut butter crackers

## 2022-04-29 LAB — URINE CULTURE: Culture: >=100000 — AB

## 2022-05-05 ENCOUNTER — Other Ambulatory Visit: Payer: Self-pay | Admitting: Urology

## 2022-05-22 NOTE — Patient Instructions (Signed)
SURGICAL WAITING ROOM VISITATION Patients having surgery or a procedure may have no more than 2 support people in the waiting area - these visitors may rotate.   Children under the age of 40 must have an adult with them who is not the patient. If the patient needs to stay at the hospital during part of their recovery, the visitor guidelines for inpatient rooms apply. Pre-op nurse will coordinate an appropriate time for 1 support person to accompany patient in pre-op.  This support person may not rotate.    Please refer to the [facility] website for the visitor guidelines for Inpatients (after your surgery is over and you are in a regular room).    Your procedure is scheduled on: 06/01/22    Report to [facility] Main Entrance    Report to admitting at 5:15 AM   Call this number if you have problems the morning of surgery [phone]   Do not eat food  or drink liquids :After Midnight.   After Midnight you may have the following liquids until 4:30 AM/ PM DAY OF SURGERY  Water Non-Citrus Juices (without pulp, NO RED) Carbonated Beverages Black Coffee (NO MILK/CREAM OR CREAMERS, sugar ok)  Clear Tea (NO MILK/CREAM OR CREAMERS, sugar ok) regular and decaf                             Plain Jell-O (NO RED)                                           Fruit ices (not with fruit pulp, NO RED)                                     Popsicles (NO RED)                                                               Sports drinks like Gatorade (NO RED)            FOLLOW BOWEL PREP AND ANY ADDITIONAL PRE OP INSTRUCTIONS YOU RECEIVED FROM YOUR SURGEON'S OFFICE!!!     Oral Hygiene is also important to reduce your risk of infection.                                    Remember - BRUSH YOUR TEETH THE MORNING OF SURGERY WITH YOUR REGULAR TOOTHPASTE   Do NOT smoke after Midnight   Take these medicines the morning of surgery with A SIP OF WATER: Tylenol   DO NOT TAKE ANY ORAL DIABETIC MEDICATIONS  DAY OF YOUR SURGERY  Bring CPAP mask and tubing day of surgery.                              You may not have any metal on your body including hair pins, jewelry, and body piercing             Do not  wear make-up, lotions, powders, perfumes, or deodorant  Do not wear nail polish including gel and S&S, artificial/acrylic nails, or any other type of covering on natural nails including finger and toenails. If you have artificial nails, gel coating, etc. that needs to be removed by a nail salon please have this removed prior to surgery or surgery may need to be canceled/ delayed if the surgeon/ anesthesia feels like they are unable to be safely monitored.   Do not shave  48 hours prior to surgery.    Do not bring valuables to the hospital. [place].   Contacts, dentures or bridgework may not be worn into surgery.   Bring small overnight bag day of surgery.   DO NOT [place]. PHARMACY WILL DISPENSE MEDICATIONS LISTED ON YOUR MEDICATION LIST TO YOU DURING YOUR ADMISSION [place]!    Patients discharged on the day of surgery will not be allowed to drive home.  Someone NEEDS to stay with you for the first 24 hours after anesthesia.   Special Instructions: Bring a copy of your healthcare power of attorney and living will documents         the day of surgery if you haven't scanned them before.              Please read over the following fact sheets you were given: IF YOU HAVE QUESTIONS ABOUT YOUR PRE-OP INSTRUCTIONS PLEASE CALL [facility] - Preparing for Surgery Before surgery, you can play an important role.  Because skin is not sterile, your skin needs to be as free of germs as possible.  You can reduce the number of germs on your skin by washing with CHG (chlorahexidine gluconate) soap before surgery.  CHG is an antiseptic cleaner which kills germs and bonds with the skin to  continue killing germs even after washing. Please DO NOT use if you have an allergy to CHG or antibacterial soaps.  If your skin becomes reddened/irritated stop using the CHG and inform your nurse when you arrive at Short Stay. Do not shave (including legs and underarms) for at least 48 hours prior to the first CHG shower.  You may shave your face/neck.  Please follow these instructions carefully:  1.  Shower with CHG Soap the night before surgery and the  morning of surgery.  2.  If you choose to wash your hair, wash your hair first as usual with your normal  shampoo.  3.  After you shampoo, rinse your hair and body thoroughly to remove the shampoo.                             4.  Use CHG as you would any other liquid soap.  You can apply chg directly to the skin and wash.  Gently with a scrungie or clean washcloth.  5.  Apply the CHG Soap to your body ONLY FROM THE NECK DOWN.   Do   not use on face/ open                           Wound or open sores. Avoid contact with eyes, ears mouth and   genitals (private parts).  Wash face,  Genitals (private parts) with your normal soap.             6.  Wash thoroughly, paying special attention to the area where your    surgery  will be performed.  7.  Thoroughly rinse your body with warm water from the neck down.  8.  DO NOT shower/wash with your normal soap after using and rinsing off the CHG Soap.                9.  Pat yourself dry with a clean towel.            10.  Wear clean pajamas.            11.  Place clean sheets on your bed the night of your first shower and do not  sleep with pets. Day of Surgery : Do not apply any lotions/deodorants the morning of surgery.  Please wear clean clothes to the hospital/surgery center.  FAILURE TO FOLLOW THESE INSTRUCTIONS MAY RESULT IN THE CANCELLATION OF YOUR SURGERY  PATIENT SIGNATURE_________________________________  NURSE  SIGNATURE__________________________________  ________________________________________________________________________

## 2022-05-22 NOTE — Progress Notes (Signed)
Patient and husband unaware of  COVID Vaccine Completed: yes   Date of COVID positive in last 90 days: no  PCP - Adline Mango, MD Cardiologist - Dr. Otho Perl  Cardiac clearance dependent on stress and Holter results  Chest x-ray - 06/11/21 Epic EKG - 04/23/22 [facility]  Stress Test - 05/15/22 [facility] ECHO - 06/26/18 Epic Cardiac Cath - yes over 20 years ago per pt Pacemaker/ICD device last checked: n/a Spinal Cord Stimulator: n/a  Bowel Prep - no  Sleep Study - n/a CPAP -   Fasting Blood Sugar - n/a Checks Blood Sugar _____ times a day  Blood Thinner Instructions: n/a Aspirin Instructions: Last Dose:  Activity level: Can go up a flight of stairs and perform activities of daily living without stopping and without symptoms of chest pain or shortness of breath.      Anesthesia review: CAD, LBBB, lung nodule, MS, SOB  Patient denies shortness of breath, fever, cough and chest pain at PAT appointment  Patient verbalized understanding of instructions that were given to them at the PAT appointment. Patient was also instructed that they will need to review over the PAT instructions again at home before surgery.

## 2022-05-25 ENCOUNTER — Encounter (HOSPITAL_COMMUNITY): Payer: Self-pay

## 2022-05-25 ENCOUNTER — Ambulatory Visit: Payer: Medicare Other | Admitting: Cardiology

## 2022-05-25 ENCOUNTER — Encounter (HOSPITAL_COMMUNITY)
Admission: RE | Admit: 2022-05-25 | Discharge: 2022-05-25 | Disposition: A | Discharge status: Home / Self Care | Payer: Medicare Other | Source: Ambulatory Visit | Attending: Urology | Admitting: Urology

## 2022-05-25 VITALS — BP 144/87 | HR 71 | Temp 97.6°F | Resp 16 | Ht 63.0 in | Wt 153.0 lb

## 2022-05-25 DIAGNOSIS — I25118 Atherosclerotic heart disease of native coronary artery with other forms of angina pectoris: Secondary | ICD-10-CM | POA: Diagnosis not present

## 2022-05-25 DIAGNOSIS — I517 Cardiomegaly: Secondary | ICD-10-CM | POA: Diagnosis not present

## 2022-05-25 DIAGNOSIS — N210 Calculus in bladder: Secondary | ICD-10-CM | POA: Diagnosis not present

## 2022-05-25 DIAGNOSIS — Z01812 Encounter for preprocedural laboratory examination: Secondary | ICD-10-CM | POA: Insufficient documentation

## 2022-05-25 DIAGNOSIS — G35 Multiple sclerosis: Secondary | ICD-10-CM | POA: Insufficient documentation

## 2022-05-25 HISTORY — DX: Personal history of urinary calculi: Z87.442

## 2022-05-25 HISTORY — DX: Anemia, unspecified: D64.9

## 2022-05-25 HISTORY — DX: Cardiac arrhythmia, unspecified: I49.9

## 2022-05-25 HISTORY — DX: Pneumonia, unspecified organism: J18.9

## 2022-05-25 HISTORY — DX: Headache, unspecified: R51.9

## 2022-05-25 HISTORY — DX: Unspecified glaucoma: H40.9

## 2022-05-25 LAB — BASIC METABOLIC PANEL
Anion gap: 8 (ref 5–15)
BUN: 17 mg/dL (ref 8–23)
CO2: 27 mmol/L (ref 22–32)
Calcium: 9.8 mg/dL (ref 8.9–10.3)
Chloride: 106 mmol/L (ref 98–111)
Creatinine, Ser: 0.61 mg/dL (ref 0.44–1.00)
GFR, Estimated: >60 mL/min (ref >60)
Glucose, Bld: 81 mg/dL (ref 70–99)
Potassium: 3.9 mmol/L (ref 3.5–5.1)
Sodium: 141 mmol/L (ref 135–145)

## 2022-05-25 LAB — CBC
HCT: 45.2 % (ref 36.0–46.0)
Hemoglobin: 14.4 g/dL (ref 12.0–15.0)
MCH: 28.2 pg (ref 26.0–34.0)
MCHC: 31.9 g/dL (ref 30.0–36.0)
MCV: 88.5 fL (ref 80.0–100.0)
Platelets: 198 10*3/uL (ref 150–400)
RBC: 5.11 MIL/uL (ref 3.87–5.11)
RDW: 14.9 % (ref 11.5–15.5)
WBC: 4.5 10*3/uL (ref 4.0–10.5)
nRBC: 0 % (ref 0.0–0.2)

## 2022-05-26 NOTE — Progress Notes (Signed)
Anesthesia Chart Review   Case: [id number] Date/Time: 06/01/22 0715   Procedures:      CYSTOSCOPY WITH BILATERAL RETROGRADE PYELOGRAM, BILATERAL URETEROSCOPY WIHT HOLMIUM LASER AND STENT PLACEMENT (Bilateral)     POSSIBLE TRANSURETHRAL RESECTION OF BLADDER TUMOR (TURBT) - 90 MINS FOR CASE   Anesthesia type: General   Pre-op diagnosis: BILATERTAL STONES   Location: [place] / [facility]   Surgeons: Lucas Mallow, MD       DISCUSSION:76 y.o. never smoker with h/o MS, palpitations, bilateral stones scheduled for above procedure 06/01/2022 with Dr. Link Snuffer.   Pt seen by cardiology for preoperative evaluation 04/23/2022.   Echo 05/15/2022 with EF 65-70%, no valvular problems.   Low risk stress test 04/30/2022.   Anticipate pt can proceed with planned procedure barring acute status change.   VS: BP (!) 144/87   Pulse 71   Temp 36.4 C (Oral)   Resp 16   Ht '5\' 3"'$  (1.6 m)   Wt 69.4 kg   SpO2 100%   BMI 27.10 kg/m   PROVIDERS: Karleen Hampshire., MD is PCP   Abran Richard, MD is Cardiologist  LABS: Labs reviewed: Acceptable for surgery. (all labs ordered are listed, but only abnormal results are displayed)  Labs Reviewed  BASIC METABOLIC PANEL  CBC     IMAGES:   EKG:   CV: Echo 05/15/2022 SUMMARY  There is moderate concentric left ventricular hypertrophy.  Left ventricular systolic function is normal.  LV ejection fraction = 65-70%.  There is no comparison study available.   Past Medical History:  Diagnosis Date   Anemia    Blindness    Dysrhythmia    GERD (gastroesophageal reflux disease)    Glaucoma    Headache    History of kidney stones    Legally blind 2018   Multiple sclerosis ([place])    Osteoporosis    Pneumonia     Past Surgical History:  Procedure Laterality Date   ABDOMINAL HYSTERECTOMY     CATARACT EXTRACTION Left    CYSTOSCOPY WITH RETROGRADE PYELOGRAM, URETEROSCOPY AND STENT PLACEMENT Left 08/01/2018   Procedure: CYSTOSCOPY WITH  RETROGRADE PYELOGRAM, URETEROSCOPY, HOLMIUM LASER AND STENT PLACEMENT;  Surgeon: Lucas Mallow, MD;  Location: [facility];  Service: Urology;  Laterality: Left;   ESOPHAGOGASTRODUODENOSCOPY N/A 11/06/2012   Procedure: ESOPHAGOGASTRODUODENOSCOPY (EGD);  Surgeon: Lear Ng, MD;  Location: [facility] ENDOSCOPY;  Service: Endoscopy;  Laterality: N/A;   KIDNEY STONE SURGERY      MEDICATIONS:  acetaminophen (TYLENOL) 325 MG tablet   acidophilus (RISAQUAD) CAPS capsule   baclofen (LIORESAL) 10 MG tablet   carboxymethylcellulose (REFRESH PLUS) 0.5 % SOLN   cefdinir (OMNICEF) 300 MG capsule   ciclopirox (PENLAC) 8 % solution   Cod Liver Oil CAPS   Cyanocobalamin 500 MCG SUBL   ELDERBERRY PO   FIBER ADULT GUMMIES PO   gabapentin (NEURONTIN) 300 MG capsule   influenza vaccine adjuvanted (FLUAD QUADRIVALENT) 0.5 ML injection   meloxicam (MOBIC) 15 MG tablet   Multiple Vitamin (MULTIVITAMIN WITH MINERALS) TABS tablet   naproxen sodium (ALEVE) 220 MG tablet   No current facility-administered medications for this encounter.     Konrad Felix Ward, PA-C [facility] [phone]

## 2022-05-31 NOTE — Anesthesia Preprocedure Evaluation (Signed)
Anesthesia Evaluation  Patient identified by MRN, date of birth, ID band Patient awake    Reviewed: Allergy & Precautions, NPO status , Patient's Chart, lab work & pertinent test results  History of Anesthesia Complications Negative for: history of anesthetic complications  Airway Mallampati: III  TM Distance: >3 FB Neck ROM: Full    Dental  (+) Dental Advisory Given, Partial Upper, Implants   Pulmonary neg pulmonary ROS,    Pulmonary exam normal        Cardiovascular + CAD  Normal cardiovascular exam     Neuro/Psych  Headaches,  Blindness Wheelchair dependent   Neuromuscular disease (MS) negative psych ROS   GI/Hepatic Neg liver ROS, GERD  Controlled,  Endo/Other  negative endocrine ROS  Renal/GU      Musculoskeletal negative musculoskeletal ROS (+)   Abdominal   Peds  Hematology negative hematology ROS (+)   Anesthesia Other Findings   Reproductive/Obstetrics                            Anesthesia Physical Anesthesia Plan  ASA: 3  Anesthesia Plan: General   Post-op Pain Management: Tylenol PO (pre-op)*   Induction: Intravenous  PONV Risk Score and Plan: 3 and Treatment may vary due to age or medical condition, Ondansetron and Dexamethasone  Airway Management Planned: Oral ETT and LMA  Additional Equipment: None  Intra-op Plan:   Post-operative Plan: Extubation in OR  Informed Consent: I have reviewed the patients History and Physical, chart, labs and discussed the procedure including the risks, benefits and alternatives for the proposed anesthesia with the patient or authorized representative who has indicated his/her understanding and acceptance.     Dental advisory given  Plan Discussed with: CRNA and Anesthesiologist  Anesthesia Plan Comments: (LMA unless relaxation requested by surgeon, then ETT )       Anesthesia Quick Evaluation

## 2022-06-01 ENCOUNTER — Ambulatory Visit (HOSPITAL_COMMUNITY): Payer: Medicare Other

## 2022-06-01 ENCOUNTER — Observation Stay (HOSPITAL_COMMUNITY)
Admission: RE | Admit: 2022-06-01 | Discharge: 2022-06-02 | Disposition: A | Discharge status: Home / Self Care | Payer: Medicare Other | Attending: Urology | Admitting: Urology

## 2022-06-01 ENCOUNTER — Other Ambulatory Visit: Payer: Self-pay

## 2022-06-01 ENCOUNTER — Ambulatory Visit (HOSPITAL_COMMUNITY): Payer: Medicare Other | Admitting: Physician Assistant

## 2022-06-01 ENCOUNTER — Encounter (HOSPITAL_COMMUNITY): Payer: Self-pay | Admitting: Urology

## 2022-06-01 ENCOUNTER — Ambulatory Visit (HOSPITAL_BASED_OUTPATIENT_CLINIC_OR_DEPARTMENT_OTHER): Payer: Medicare Other | Admitting: Certified Registered Nurse Anesthetist

## 2022-06-01 ENCOUNTER — Encounter (HOSPITAL_COMMUNITY)
Admission: RE | Disposition: A | Discharge status: Home / Self Care | Payer: Self-pay | Source: Home / Self Care | Attending: Urology

## 2022-06-01 DIAGNOSIS — R9341 Abnormal radiologic findings on diagnostic imaging of renal pelvis, ureter, or bladder: Secondary | ICD-10-CM | POA: Diagnosis not present

## 2022-06-01 DIAGNOSIS — N2889 Other specified disorders of kidney and ureter: Secondary | ICD-10-CM

## 2022-06-01 DIAGNOSIS — N3020 Other chronic cystitis without hematuria: Secondary | ICD-10-CM | POA: Diagnosis not present

## 2022-06-01 DIAGNOSIS — J45909 Unspecified asthma, uncomplicated: Secondary | ICD-10-CM | POA: Diagnosis not present

## 2022-06-01 DIAGNOSIS — C679 Malignant neoplasm of bladder, unspecified: Principal | ICD-10-CM | POA: Insufficient documentation

## 2022-06-01 DIAGNOSIS — D494 Neoplasm of unspecified behavior of bladder: Secondary | ICD-10-CM

## 2022-06-01 DIAGNOSIS — N200 Calculus of kidney: Secondary | ICD-10-CM | POA: Diagnosis not present

## 2022-06-01 DIAGNOSIS — I2510 Atherosclerotic heart disease of native coronary artery without angina pectoris: Secondary | ICD-10-CM

## 2022-06-01 DIAGNOSIS — R310 Gross hematuria: Secondary | ICD-10-CM | POA: Insufficient documentation

## 2022-06-01 DIAGNOSIS — Z7982 Long term (current) use of aspirin: Secondary | ICD-10-CM | POA: Diagnosis not present

## 2022-06-01 HISTORY — PX: TRANSURETHRAL RESECTION OF BLADDER TUMOR: SHX2575

## 2022-06-01 HISTORY — PX: CYSTOSCOPY WITH RETROGRADE PYELOGRAM, URETEROSCOPY AND STENT PLACEMENT: SHX5789

## 2022-06-01 SURGERY — CYSTOURETEROSCOPY, WITH RETROGRADE PYELOGRAM AND STENT INSERTION
Anesthesia: General

## 2022-06-01 MED ORDER — EPHEDRINE 5 MG/ML INJ
INTRAVENOUS | Status: AC
  Filled 2022-06-01: qty 5

## 2022-06-01 MED ORDER — CARBOXYMETHYLCELLULOSE SODIUM 0.5 % OP SOLN: 2.0000 [drp] | Freq: Two times a day (BID) | OPHTHALMIC | Status: DC | PRN

## 2022-06-01 MED ORDER — PHENYLEPHRINE HCL-NACL 20-0.9 MG/250ML-% IV SOLN
INTRAVENOUS | Status: DC | PRN
  Administered 2022-06-01: 40 ug via INTRAVENOUS

## 2022-06-01 MED ORDER — CEFAZOLIN SODIUM-DEXTROSE 2-4 GM/100ML-% IV SOLN
2.0000 g | INTRAVENOUS | Status: DC
  Filled 2022-06-01: qty 100

## 2022-06-01 MED ORDER — VITAMIN B-12 100 MCG PO TABS
500.0000 ug | ORAL_TABLET | Freq: Every day | ORAL | Status: DC
  Administered 2022-06-02: 500 ug via ORAL
  Filled 2022-06-01: qty 5

## 2022-06-01 MED ORDER — SENNA 8.6 MG PO TABS
1.0000 | ORAL_TABLET | Freq: Two times a day (BID) | ORAL | Status: DC
  Filled 2022-06-01 (×2): qty 1

## 2022-06-01 MED ORDER — ONDANSETRON HCL 4 MG/2ML IJ SOLN
INTRAMUSCULAR | Status: DC | PRN
  Administered 2022-06-01: 4 mg via INTRAVENOUS

## 2022-06-01 MED ORDER — ONDANSETRON HCL 4 MG/2ML IJ SOLN
INTRAMUSCULAR | Status: AC
  Filled 2022-06-01: qty 2

## 2022-06-01 MED ORDER — LACTATED RINGERS IV SOLN: INTRAVENOUS | Status: DC

## 2022-06-01 MED ORDER — OXYBUTYNIN CHLORIDE 5 MG PO TABS
5.0000 mg | ORAL_TABLET | Freq: Three times a day (TID) | ORAL | Status: DC | PRN
  Administered 2022-06-01: 5 mg via ORAL
  Filled 2022-06-01: qty 1

## 2022-06-01 MED ORDER — CEFDINIR 300 MG PO CAPS
300.0000 mg | ORAL_CAPSULE | Freq: Two times a day (BID) | ORAL | Status: DC
  Administered 2022-06-01 – 2022-06-02 (×3): 300 mg via ORAL
  Filled 2022-06-01 (×3): qty 1

## 2022-06-01 MED ORDER — CICLOPIROX 8 % EX SOLN: Freq: Every day | CUTANEOUS | Status: DC

## 2022-06-01 MED ORDER — PHENOL 1.4 % MT LIQD
1.0000 | OROMUCOSAL | Status: DC | PRN
  Administered 2022-06-01: 1 via OROMUCOSAL
  Filled 2022-06-01: qty 177

## 2022-06-01 MED ORDER — POLYVINYL ALCOHOL 1.4 % OP SOLN: 1.0000 [drp] | Freq: Two times a day (BID) | OPHTHALMIC | Status: DC | PRN

## 2022-06-01 MED ORDER — SUGAMMADEX SODIUM 500 MG/5ML IV SOLN
INTRAVENOUS | Status: DC | PRN
  Administered 2022-06-01: 300 mg via INTRAVENOUS

## 2022-06-01 MED ORDER — OXYCODONE HCL 5 MG PO TABS
5.0000 mg | ORAL_TABLET | ORAL | Status: DC | PRN
  Administered 2022-06-01 – 2022-06-02 (×2): 5 mg via ORAL
  Filled 2022-06-01 (×2): qty 1

## 2022-06-01 MED ORDER — SODIUM CHLORIDE 0.9 % IR SOLN
Status: DC | PRN
  Administered 2022-06-01: 3000 mL

## 2022-06-01 MED ORDER — CYANOCOBALAMIN 500 MCG SL SUBL: 500.0000 ug | SUBLINGUAL_TABLET | Freq: Every day | SUBLINGUAL | Status: DC

## 2022-06-01 MED ORDER — PROPOFOL 10 MG/ML IV BOLUS
INTRAVENOUS | Status: AC
  Filled 2022-06-01: qty 20

## 2022-06-01 MED ORDER — CHLORHEXIDINE GLUCONATE 0.12 % MT SOLN
15.0000 mL | Freq: Once | OROMUCOSAL | Status: AC
  Administered 2022-06-01: 15 mL via OROMUCOSAL

## 2022-06-01 MED ORDER — PROPOFOL 10 MG/ML IV BOLUS
INTRAVENOUS | Status: DC | PRN
  Administered 2022-06-01: 160 mg via INTRAVENOUS

## 2022-06-01 MED ORDER — SUGAMMADEX SODIUM 500 MG/5ML IV SOLN
INTRAVENOUS | Status: AC
  Filled 2022-06-01: qty 5

## 2022-06-01 MED ORDER — DIPHENHYDRAMINE HCL 50 MG/ML IJ SOLN: 12.5000 mg | Freq: Four times a day (QID) | INTRAMUSCULAR | Status: DC | PRN

## 2022-06-01 MED ORDER — STERILE WATER FOR IRRIGATION IR SOLN
Status: DC | PRN
  Administered 2022-06-01: 3000 mL

## 2022-06-01 MED ORDER — MORPHINE SULFATE (PF) 2 MG/ML IV SOLN: 2.0000 mg | INTRAVENOUS | Status: DC | PRN

## 2022-06-01 MED ORDER — EPHEDRINE SULFATE (PRESSORS) 50 MG/ML IJ SOLN
INTRAMUSCULAR | Status: DC | PRN
  Administered 2022-06-01: 10 mg via INTRAVENOUS
  Administered 2022-06-01: 5 mg via INTRAVENOUS
  Administered 2022-06-01: 10 mg via INTRAVENOUS

## 2022-06-01 MED ORDER — DEXAMETHASONE SODIUM PHOSPHATE 10 MG/ML IJ SOLN
INTRAMUSCULAR | Status: DC | PRN
  Administered 2022-06-01: 4 mg via INTRAVENOUS

## 2022-06-01 MED ORDER — ROCURONIUM BROMIDE 10 MG/ML (PF) SYRINGE
PREFILLED_SYRINGE | INTRAVENOUS | Status: DC | PRN
  Administered 2022-06-01: 20 mg via INTRAVENOUS
  Administered 2022-06-01: 50 mg via INTRAVENOUS

## 2022-06-01 MED ORDER — FENTANYL CITRATE PF 50 MCG/ML IJ SOSY: 25.0000 ug | PREFILLED_SYRINGE | INTRAMUSCULAR | Status: DC | PRN

## 2022-06-01 MED ORDER — IOHEXOL 300 MG/ML  SOLN
INTRAMUSCULAR | Status: DC | PRN
  Administered 2022-06-01: 15 mL

## 2022-06-01 MED ORDER — CIPROFLOXACIN IN D5W 400 MG/200ML IV SOLN
400.0000 mg | Freq: Once | INTRAVENOUS | Status: AC
  Administered 2022-06-01: 400 mg via INTRAVENOUS
  Filled 2022-06-01: qty 200

## 2022-06-01 MED ORDER — ESMOLOL HCL 100 MG/10ML IV SOLN
INTRAVENOUS | Status: DC | PRN
  Administered 2022-06-01: 20 mg via INTRAVENOUS

## 2022-06-01 MED ORDER — OXYCODONE HCL 5 MG PO TABS: 5.0000 mg | ORAL_TABLET | Freq: Once | ORAL | Status: DC | PRN

## 2022-06-01 MED ORDER — FENTANYL CITRATE (PF) 100 MCG/2ML IJ SOLN
INTRAMUSCULAR | Status: AC
  Filled 2022-06-01: qty 2

## 2022-06-01 MED ORDER — ONDANSETRON HCL 4 MG/2ML IJ SOLN: 4.0000 mg | Freq: Once | INTRAMUSCULAR | Status: DC | PRN

## 2022-06-01 MED ORDER — MENTHOL 3 MG MT LOZG
1.0000 | LOZENGE | OROMUCOSAL | Status: DC | PRN
  Administered 2022-06-01: 3 mg via ORAL
  Filled 2022-06-01: qty 9

## 2022-06-01 MED ORDER — BACLOFEN 10 MG PO TABS: 10.0000 mg | ORAL_TABLET | Freq: Three times a day (TID) | ORAL | Status: DC | PRN

## 2022-06-01 MED ORDER — ORAL CARE MOUTH RINSE: 15.0000 mL | Freq: Once | OROMUCOSAL | Status: AC

## 2022-06-01 MED ORDER — DIPHENHYDRAMINE HCL 12.5 MG/5ML PO ELIX: 12.5000 mg | ORAL_SOLUTION | Freq: Four times a day (QID) | ORAL | Status: DC | PRN

## 2022-06-01 MED ORDER — DEXAMETHASONE SODIUM PHOSPHATE 10 MG/ML IJ SOLN
INTRAMUSCULAR | Status: AC
  Filled 2022-06-01: qty 1

## 2022-06-01 MED ORDER — DOCUSATE SODIUM 100 MG PO CAPS
100.0000 mg | ORAL_CAPSULE | Freq: Two times a day (BID) | ORAL | Status: DC
  Filled 2022-06-01 (×2): qty 1

## 2022-06-01 MED ORDER — RISAQUAD PO CAPS
1.0000 | ORAL_CAPSULE | Freq: Every day | ORAL | Status: DC
  Administered 2022-06-01 – 2022-06-02 (×2): 1 via ORAL
  Filled 2022-06-01 (×2): qty 1

## 2022-06-01 MED ORDER — OXYCODONE HCL 5 MG/5ML PO SOLN: 5.0000 mg | Freq: Once | ORAL | Status: DC | PRN

## 2022-06-01 MED ORDER — ACETAMINOPHEN 325 MG PO TABS: 650.0000 mg | ORAL_TABLET | ORAL | Status: DC | PRN

## 2022-06-01 MED ORDER — ZOLPIDEM TARTRATE 5 MG PO TABS: 5.0000 mg | ORAL_TABLET | Freq: Every evening | ORAL | Status: DC | PRN

## 2022-06-01 MED ORDER — ONDANSETRON HCL 4 MG/2ML IJ SOLN: 4.0000 mg | INTRAMUSCULAR | Status: DC | PRN

## 2022-06-01 MED ORDER — LIDOCAINE 2% (20 MG/ML) 5 ML SYRINGE
INTRAMUSCULAR | Status: DC | PRN
  Administered 2022-06-01: 60 mg via INTRAVENOUS

## 2022-06-01 MED ORDER — FENTANYL CITRATE (PF) 100 MCG/2ML IJ SOLN
INTRAMUSCULAR | Status: DC | PRN
  Administered 2022-06-01: 25 ug via INTRAVENOUS
  Administered 2022-06-01: 50 ug via INTRAVENOUS
  Administered 2022-06-01: 25 ug via INTRAVENOUS

## 2022-06-01 MED ORDER — GABAPENTIN 300 MG PO CAPS: 300.0000 mg | ORAL_CAPSULE | Freq: Every day | ORAL | Status: DC | PRN

## 2022-06-01 MED ORDER — LIDOCAINE HCL (PF) 2 % IJ SOLN
INTRAMUSCULAR | Status: AC
  Filled 2022-06-01: qty 5

## 2022-06-01 MED ORDER — MELOXICAM 15 MG PO TABS: 15.0000 mg | ORAL_TABLET | Freq: Every day | ORAL | Status: DC | PRN

## 2022-06-01 SURGICAL SUPPLY — 32 items
BAG URINE DRAIN 2000ML AR STRL (UROLOGICAL SUPPLIES) IMPLANT
BAG URO CATCHER STRL LF (MISCELLANEOUS) ×2 IMPLANT
BASKET LASER NITINOL 1.9FR (BASKET) IMPLANT
BASKET ZERO TIP NITINOL 2.4FR (BASKET) IMPLANT
CATH FOLEY 2WAY SLVR  5CC 18FR (CATHETERS)
CATH FOLEY 2WAY SLVR 5CC 18FR (CATHETERS) IMPLANT
CATH URETERAL DUAL LUMEN 10F (MISCELLANEOUS) IMPLANT
CATH URETL OPEN END 6FR 70 (CATHETERS) ×2 IMPLANT
CLOTH BEACON ORANGE TIMEOUT ST (SAFETY) ×2 IMPLANT
DRAPE FOOT SWITCH (DRAPES) ×2 IMPLANT
ELECT REM PT RETURN 15FT ADLT (MISCELLANEOUS) ×2 IMPLANT
EXTRACTOR STONE 1.7FRX115CM (UROLOGICAL SUPPLIES) IMPLANT
GLOVE BIO SURGEON STRL SZ7.5 (GLOVE) ×2 IMPLANT
GOWN STRL REUS W/ TWL XL LVL3 (GOWN DISPOSABLE) ×2 IMPLANT
GOWN STRL REUS W/TWL XL LVL3 (GOWN DISPOSABLE) ×2
GUIDEWIRE ANG ZIPWIRE 038X150 (WIRE) IMPLANT
GUIDEWIRE STR DUAL SENSOR (WIRE) ×2 IMPLANT
KIT TURNOVER KIT A (KITS) IMPLANT
LASER FIB FLEXIVA PULSE ID 365 (Laser) IMPLANT
LOOP CUT BIPOLAR 24F LRG (ELECTROSURGICAL) IMPLANT
MANIFOLD NEPTUNE II (INSTRUMENTS) ×2 IMPLANT
PACK CYSTO (CUSTOM PROCEDURE TRAY) ×2 IMPLANT
PLUG CATH AND CAP STER (CATHETERS) IMPLANT
SHEATH NAVIGATOR HD 11/13X28 (SHEATH) IMPLANT
SHEATH NAVIGATOR HD 11/13X36 (SHEATH) IMPLANT
STENT URET 6FRX24 CONTOUR (STENTS) IMPLANT
SYR TOOMEY IRRIG 70ML (MISCELLANEOUS)
SYRINGE TOOMEY IRRIG 70ML (MISCELLANEOUS) IMPLANT
TRACTIP FLEXIVA PULS ID 200XHI (Laser) IMPLANT
TRACTIP FLEXIVA PULSE ID 200 (Laser) ×2
TUBING CONNECTING 10 (TUBING) ×2 IMPLANT
TUBING UROLOGY SET (TUBING) ×2 IMPLANT

## 2022-06-01 NOTE — Op Note (Signed)
Operative Note  Preoperative diagnosis:  1.  Gross hematuria 2.  Abnormal imaging of the right ureter/urinary tract 3.  Bilateral renal calculi  Postoperative diagnosis: 1.  Bladder tumor-medium 2.  Bilateral renal calculi 3.  Right proximal ureteral inflammation versus tumor   Procedure(s): 1.  Cystoscopy with bladder biopsy and fulguration/destruction of medium bladder tumor-status 2.5 cm 2.  Right retrograde pyelogram, ureteroscopy with laser lithotripsy and stone basketing, right ureteral biopsy, right ureteral stent 3.  Left retrograde pyelogram, left diagnostic ureteroscopy, left ureteral stent placement  Surgeon: Link Snuffer, MD  Assistants: None  Anesthesia: General  Complications: None immediate  EBL: Minimal  Specimens: 1.  Renal calculi  Drains/Catheters: 1.  6 x 24 double-J ureteral stent bilaterally  Intraoperative findings: 1.  Normal urethra 2.  Bladder mucosa with an approximately 2.5 cm flat slightly raised area of erythema on the right anterior lateral wall that was biopsied x2 and fulgurated completely.  No other obvious abnormalities within the bladder. 3.  Right ureteroscopy revealed an approximate 1 cm stone that was fragmented to tiny fragments and larger fragments basket extracted.  There was ureteropelvic junction/right proximal ureteral inflammation and thickening that was biopsied x2.  Retrograde pyelogram revealed no hydronephrosis.  No obvious filling defect after treatment of stone. 4.  Left retrograde pyelogram revealed no hydronephrosis or obvious filling defect except for those consistent with renal stone.  Diagnostic ureteroscopy revealed no ureteral tumors or ureteral stones.  Ureter was too tight for the sheath to advance into the kidney for treatment of the upper tract.  Indication: 76 year old female with gross hematuria.  CT scan showed bilateral renal calculi.  Specifically she had a right renal pelvis calculus and severe urothelial  thickening of the collecting system and proximal ureter.  She presents for the previously mentioned operation.  Description of procedure:  The patient was identified and consent was obtained.  The patient was taken to the operating room and placed in the supine position.  The patient was placed under general anesthesia.  Perioperative antibiotics were administered.  The patient was placed in dorsal lithotomy.  Patient was prepped and draped in a standard sterile fashion and a timeout was performed.  A 21 French rigid cystoscope was advanced into the urethra and into the bladder complete cystoscopy was performed with findings noted above.  I used biopsy forceps to biopsy the area of interest x2.  I passed this off for specimen.  I used a Bugbee to fulgurate the area.  Total area of biopsy fulguration was about 2.5 cm.  I then advanced a sensor wire up the right ureter and into the kidney under fluoroscopic guidance.  Semirigid ureteroscopy was performed alongside the wire and no ureteral tumors or ureteral calculi were seen.  I passed a second wire through the scope and into the kidney and withdrew the scope.  1 the wires was secured to the drape as a safety wire and the other wire was used to advance a 11 x 13 ureteral access sheath over the wire under continuous fluoroscopic guidance up to the proximal ureter.  Inner sheath and wire were withdrawn.  Digital ureteroscopy was performed and the stone was lasered fragmented to tiny fragments.  Larger fragments were basket extracted.  Once all the stone was treated, I inspected the ureteropelvic junction and proximal ureter.  There was urothelial thickening and so I used biopsy forceps to biopsy the proximal ureter x2.  This was passed off for specimen.  I then shot a retrograde  pyelogram through the scope with the findings noted above.  There is no extravasation of contrast to suggest any ureteral injury.  I withdrew the scope along with the access sheath  visualizing the entire ureter upon removal.  Again there were no ureteral calculi and no ureteral injury was seen.  I backloaded the wire onto rigid cystoscope and advanced that into the bladder followed by routine placement of a 6 x 24 double-J ureteral stent.  Fluoroscopy confirmed proximal placement and direct visualization confirmed a good coil within the bladder.  I drained the bladder.  A sensor wire was advanced up the left ureter and into the kidney under fluoroscopic guidance.  A second wire was advanced alongside this up into the kidney.  Semirigid ureteroscopy was performed alongside the wires and up the ureter to the ureteropelvic junction.  No ureteral calculi or ureteral tumor was seen.  I withdrew the scope and secured one of the wires to the drape.  The other wire was used to advance an 11 x 13 ureteral access sheath over the wire.  This met resistance in the distal ureter.  I tried to pass the inner portion of the sheath and this also met resistance.  I advanced a dual-lumen ureteral catheter over the wire under continuous fluoroscopic guidance and this passed easily up the ureter.  I shot a retrograde pyelogram through the other lumen with the findings noted above.  I withdrew the dual-lumen ureteral catheter and again tried to pass the ureteral access sheath the inner portion and the entire portion and these met resistance.  I therefore decided to leave a stent with plans for staged procedure.  Backloaded the wire onto rigid cystoscope and advanced that into the bladder followed by routine placement of a 6 x 24 double-J ureteral stent.  Fluoroscopy confirmed proximal placement and direct visualization confirmed a good coil within the bladder.  I drained the bladder and withdrew the scope.  Patient tolerated the procedure well and was stable postoperative.  Plan: Patient is high risk for infection and is bedbound.  We will keep her overnight for observation.  We will continue her p.o.  antibiotics.  If she does well she will be discharged tomorrow.  We will plan for repeat ureteroscopy in 1 to 2 weeks.

## 2022-06-01 NOTE — OR Nursing (Signed)
Stone taken by Dr. Gloriann Loan.

## 2022-06-01 NOTE — H&P (Signed)
CC/HPI: 12/25/2020  This is a patient I have performed a left ureteroscopy with laser lithotripsy and ureteral stent placement on in the past. She comes in today with a week-long history of right-sided back pain / side pain. Nothing makes it better or worse. It is not worse with laying down. It is not worse with meals or voiding. The pain is vague. She does have a history of MS and is in a wheelchair. She has no nausea, vomiting, fever, chill. Unable to provide urine sample today. Concerned that she may have another kidney stone.   02/24/2021: 76 year old female who is wheelchair dependent with a history of MS presents today for review of her CT scan. She is also having some intermittent dysuria associated with suprapubic pressure. She denies gross hematuria, fevers, chills. Her CT scan does show bilateral nephrolithiasis with the largest stone being 7 mm. She is currently not experiencing any flank pain or discomfort at this time. She does have issues with incontinence due to MS as well as some bladder spasms. She was unable to leave a urine specimen today. She reports that her husband can bring 1 by tomorrow.   06/16/21: 76 year old female who was seen in the emergency department on 06/11/2021. Her past medical history includes multiple sclerosis, blindness, osteoporosis, nephrolithiasis. She presented to the emergency department due to chest pain and by our lateral lower extremity pain. That began 2 days prior to her presentation. She was also having right upper and lower sided abdominal pain. A thorough workup was completed and did show concern for UTI. She also had a CT of her abdomen and pelvis completed with contrast. REsults are noted below:   1. There are multiple bilateral kidney stones without evidence for  obstructing ureteral stone. Interim development of soft tissue  density and urothelial thickening at the right renal pelvis and  proximal right ureter. Findings could be secondary to  inflammation,  infection, or neoplasm  2. Multiple bilateral kidney stones  3. Moderate to large hiatal hernia   Today, she presents for follow up of the above.   06/30/2021: 76 year old female who presents today for follow-up and a renal ultrasound. Currently not having any pain or discomfort other than in her bladder. She reports that her bladder feels like it is constantly throbbing. He also has the sensation down her legs. Denies fevers and chills she denies gross hematuria.   01/20/2022  Patient presents today for follow-up. Unable to leave urine sample today. She did have an episode of gross hematuria on Sunday. She denies any associated pain or discomfort. She denies any dysuria. Upon further questioning, she has occasional right-sided abdominal pain. None currently and it was not associated with the gross hematuria on Sunday. She is not currently on any prophylactic antibiotics.   02/11/2022  Patient underwent a CT hematuria protocol. This revealed marked stranding around the proximal right ureter and renal pelvis with urothelial irregularity but no hydronephrosis or discrete lesion. There was also a 1 cm renal pelvic calculus. Thickening had increased since her last scan in September 2022. She also had moderate left sided peripelvic and periureteral stranding. She also had some left renal calculi. She also had patchy areas of asymmetric mucosal enhancement without a visible discrete lesion. She is unsure if she wants to proceed with cystoscopy with possible TURBT, bilateral ureteroscopy with laser lithotripsy, possible biopsy, ureteral stent.   05/01/2022: 76 year old female who presents today for follow-up and discussion regarding next steps. She was recently treated for a urinary  tract infection and reports that her symptoms have improved. She is currently on cephalexin. She spoke with Dr. Gloriann Loan 2 months ago about undergoing a cystoscopy with possible TURBT, bilateral ureteroscopy and laser  lithotripsy with possible biopsy and ureteral stent placement. She is now under the care of a cardiologist and reports she has to wear a heart monitor. She would like to pursue surgery but will likely need cardiac clearance. Today, she denies fevers and chills. She is not having any urgency or dysuria at this time.     ALLERGIES: Bactrim - Nausea, Diarrhea Epinephrine    MEDICATIONS: Acetaminophen  Aspir 81  Baclofen 10 mg tablet  Cod Liver Oil  Elderberry  Fiber  Gabapentin  Multivitamin  Probiotic      GU PSH: Cysto Remove Stent FB Sim - 2019 Hysterectomy Unilat SO - 2013 Locm 300-'399Mg'$ /Ml Iodine,1Ml - 02/03/2022 Ureteroscopic laser litho, Left - 2019        PSH Notes: Hysterectomy    NON-GU PSH: Cataract surgery, Left             GU PMH: Gross hematuria - 02/11/2022, - 02/03/2022, - 01/20/2022 Renal calculus - 02/11/2022, - 02/24/2021, - 2020, - 2019 History of urolithiasis - 01/20/2022, - 12/25/2020 Abnormal radiologic findings on diagnostic imaging of renal pelvis, ureter, or bladder - 06/30/2021, - 06/16/2021 Chronic cystitis (w/o hematuria) - 06/30/2021 Flank Pain - 06/16/2021, - 01/20/2021, - 12/25/2020 Overactive bladder - 02/24/2021, Overactive bladder, - 2014 Renal cyst - 2020 Ureteral calculus, Left - 2019 Renal and ureteral calculus, Left - 2019        PMH Notes:  1898-09-14 00:00:00 - Note: Normal Routine History And Physical Senior Citizen (17-80)     NON-GU PMH: Asthma, Asthma - 2014 Multiple sclerosis, Multiple Sclerosis - 2014 Personal history of other diseases of the nervous system and sense organs, History of glaucoma - 2014 Personal history of other specified conditions, History of heartburn - 2014 GERD Glaucoma      FAMILY HISTORY: 1 son - Other Colon Cancer - Brother Diabetes - Mother Family Health Status - Father alive at age 81 - 59 In Family Family Health Status - Mother's Age - Runs In Family Family Health Status Number - Runs In Family Heart  Disease - Father Hypertension - Brother Prostate Cancer - Runs In Family     SOCIAL HISTORY: Marital Status: Married Preferred Language: English; Race: Black or African American Current Smoking Status: Patient has never smoked.  <DIV'  Tobacco Use Assessment Completed:  Used Tobacco in last 30 days?   Does not drink caffeine.       Notes: Marital History - Currently Married, Caffeine Use, Never A Smoker, Alcohol Use, Retired From Work     REVIEW OF SYSTEMS:       GU Review Female:  Patient denies hard to postpone urination, frequent urination, have to strain to urinate, stream starts and stops, leakage of urine, trouble starting your stream, burning /pain with urination, being pregnant, and get up at night to urinate.      Gastrointestinal (Upper):  Patient denies nausea, vomiting, and indigestion/ heartburn.      Gastrointestinal (Lower):  Patient denies diarrhea and constipation.      Constitutional:  Patient denies fever, night sweats, weight loss, and fatigue.      Musculoskeletal:  Patient denies back pain and joint pain.      Neurological:  Patient denies headaches and dizziness.      Psychologic:  Patient denies depression and anxiety.  Notes: Patient is following up from the [facility] [place] [facility].        VITAL SIGNS:         05/01/2022 11:05 AM       BP 155/74 mmHg       Pulse 69 /min       Temperature 97.1 F / 36.1 C       MULTI-SYSTEM PHYSICAL EXAMINATION:        Constitutional: Well-nourished. No physical deformities. Normally developed. Good grooming.       Cardiovascular: Normal temperature, normal extremity pulses, no swelling, no varicosities.       Skin: No paleness, no jaundice, no cyanosis. No lesion, no ulcer, no rash.       Neurologic / Psychiatric: Oriented to time, oriented to place, oriented to person. No depression, no anxiety, no agitation.       Gastrointestinal: No mass, no tenderness, no rigidity, non obese abdomen.                Complexity  of Data:   Source Of History:  Patient  Records Review:  Previous Doctor Records, Previous Hospital Records, Previous Patient Records   PROCEDURES: None   ASSESSMENT:     ICD-10 Details  1 GU:  Abnormal radiologic findings on diagnostic imaging of renal pelvis, ureter, or bladder - G25.42 Acute, Uncomplicated  2  Chronic cystitis (w/o hematuria) - N30.20 Chronic, Exacerbation   PLAN:   Schedule  Return Visit/Planned Activity: Next Available Appointment - Schedule Surgery  Document  Letter(s):  Created for Patient: Clinical Summary   Notes:  She was unable to leave a urine specimen today but reports that she will drop 1 off.She is currently on cephalexin and reports her symptoms are improved. She is ready to proceed with cystoscopy, TURBT, bilateral retrograde pyelograms, bilateral ureteroscopy, bilateral thulium laser lithotripsy and bilateral stent placement. The patient will likely need clearance from cardiology. A green sheet was placed today with the scheduler.   Signed by Daine Gravel, NP on 05/01/22 at 5:18 PM (EDT

## 2022-06-01 NOTE — Anesthesia Procedure Notes (Addendum)
Procedure Name: Intubation Date/Time: 06/01/2022 7:47 AM  Performed by: West Pugh, CRNAPre-anesthesia Checklist: Patient identified, Emergency Drugs available, Suction available, Patient being monitored and Timeout performed Patient Re-evaluated:Patient Re-evaluated prior to induction Oxygen Delivery Method: Circle system utilized Preoxygenation: Pre-oxygenation with 100% oxygen Induction Type: IV induction Ventilation: Mask ventilation without difficulty and Oral airway inserted - appropriate to patient size Laryngoscope Size: Mac and 3 Grade View: Grade I Tube type: Oral Tube size: 7.0 mm Number of attempts: 1 Airway Equipment and Method: Stylet Placement Confirmation: ETT inserted through vocal cords under direct vision, positive ETCO2, CO2 detector and breath sounds checked- equal and bilateral Secured at: 20 cm Tube secured with: Tape Dental Injury: Teeth and Oropharynx as per pre-operative assessment  Comments: AOI

## 2022-06-01 NOTE — Anesthesia Postprocedure Evaluation (Signed)
Anesthesia Post Note  Patient: Perrin Eddleman [facility]  Procedure(s) Performed: CYSTOSCOPY WITH BILATERAL RETROGRADE PYELOGRAM, RIGHT URETEROSCOPY WITH HOLMIUM LASER ,STONE BASKETRY AND STENT  PLACEMENT, LEFT DIAGNOSTIC URETEROSCOPY AND STENT PLACEMENT (Bilateral) BLADDER BIOPSY       Patient location during evaluation: PACU Anesthesia Type: General Level of consciousness: awake and alert Pain management: pain level controlled Vital Signs Assessment: post-procedure vital signs reviewed and stable Respiratory status: spontaneous breathing, nonlabored ventilation and respiratory function stable Cardiovascular status: stable and blood pressure returned to baseline Anesthetic complications: no   No notable events documented.  Last Vitals:  Vitals:   06/01/22 0930 06/01/22 0945  BP: (!) 167/80 (!) 150/82  Pulse: 82 74  Resp: 19 16  Temp:  (!) 36.4 C  SpO2: 99% 98%    Last Pain:  Vitals:   06/01/22 0945  TempSrc:   PainSc: 0-No pain                 Audry Pili

## 2022-06-01 NOTE — H&P (View-Only) (Signed)
CC/HPI: 12/25/2020  This is a patient I have performed a left ureteroscopy with laser lithotripsy and ureteral stent placement on in the past. She comes in today with a week-long history of right-sided back pain / side pain. Nothing makes it better or worse. It is not worse with laying down. It is not worse with meals or voiding. The pain is vague. She does have a history of MS and is in a wheelchair. She has no nausea, vomiting, fever, chill. Unable to provide urine sample today. Concerned that she may have another kidney stone.   02/24/2021: 76 year old female who is wheelchair dependent with a history of MS presents today for review of her CT scan. She is also having some intermittent dysuria associated with suprapubic pressure. She denies gross hematuria, fevers, chills. Her CT scan does show bilateral nephrolithiasis with the largest stone being 7 mm. She is currently not experiencing any flank pain or discomfort at this time. She does have issues with incontinence due to MS as well as some bladder spasms. She was unable to leave a urine specimen today. She reports that her husband can bring 1 by tomorrow.   06/16/21: 76 year old female who was seen in the emergency department on 06/11/2021. Her past medical history includes multiple sclerosis, blindness, osteoporosis, nephrolithiasis. She presented to the emergency department due to chest pain and by our lateral lower extremity pain. That began 2 days prior to her presentation. She was also having right upper and lower sided abdominal pain. A thorough workup was completed and did show concern for UTI. She also had a CT of her abdomen and pelvis completed with contrast. REsults are noted below:   1. There are multiple bilateral kidney stones without evidence for  obstructing ureteral stone. Interim development of soft tissue  density and urothelial thickening at the right renal pelvis and  proximal right ureter. Findings could be secondary to  inflammation,  infection, or neoplasm  2. Multiple bilateral kidney stones  3. Moderate to large hiatal hernia   Today, she presents for follow up of the above.   06/30/2021: 76 year old female who presents today for follow-up and a renal ultrasound. Currently not having any pain or discomfort other than in her bladder. She reports that her bladder feels like it is constantly throbbing. He also has the sensation down her legs. Denies fevers and chills she denies gross hematuria.   01/20/2022  Patient presents today for follow-up. Unable to leave urine sample today. She did have an episode of gross hematuria on Sunday. She denies any associated pain or discomfort. She denies any dysuria. Upon further questioning, she has occasional right-sided abdominal pain. None currently and it was not associated with the gross hematuria on Sunday. She is not currently on any prophylactic antibiotics.   02/11/2022  Patient underwent a CT hematuria protocol. This revealed marked stranding around the proximal right ureter and renal pelvis with urothelial irregularity but no hydronephrosis or discrete lesion. There was also a 1 cm renal pelvic calculus. Thickening had increased since her last scan in September 2022. She also had moderate left sided peripelvic and periureteral stranding. She also had some left renal calculi. She also had patchy areas of asymmetric mucosal enhancement without a visible discrete lesion. She is unsure if she wants to proceed with cystoscopy with possible TURBT, bilateral ureteroscopy with laser lithotripsy, possible biopsy, ureteral stent.   05/01/2022: 76 year old female who presents today for follow-up and discussion regarding next steps. She was recently treated for a urinary  tract infection and reports that her symptoms have improved. She is currently on cephalexin. She spoke with Dr. Gloriann Loan 2 months ago about undergoing a cystoscopy with possible TURBT, bilateral ureteroscopy and laser  lithotripsy with possible biopsy and ureteral stent placement. She is now under the care of a cardiologist and reports she has to wear a heart monitor. She would like to pursue surgery but will likely need cardiac clearance. Today, she denies fevers and chills. She is not having any urgency or dysuria at this time.     ALLERGIES: Bactrim - Nausea, Diarrhea Epinephrine    MEDICATIONS: Acetaminophen  Aspir 81  Baclofen 10 mg tablet  Cod Liver Oil  Elderberry  Fiber  Gabapentin  Multivitamin  Probiotic      GU PSH: Cysto Remove Stent FB Sim - 2019 Hysterectomy Unilat SO - 2013 Locm 300-'399Mg'$ /Ml Iodine,1Ml - 02/03/2022 Ureteroscopic laser litho, Left - 2019        PSH Notes: Hysterectomy    NON-GU PSH: Cataract surgery, Left             GU PMH: Gross hematuria - 02/11/2022, - 02/03/2022, - 01/20/2022 Renal calculus - 02/11/2022, - 02/24/2021, - 2020, - 2019 History of urolithiasis - 01/20/2022, - 12/25/2020 Abnormal radiologic findings on diagnostic imaging of renal pelvis, ureter, or bladder - 06/30/2021, - 06/16/2021 Chronic cystitis (w/o hematuria) - 06/30/2021 Flank Pain - 06/16/2021, - 01/20/2021, - 12/25/2020 Overactive bladder - 02/24/2021, Overactive bladder, - 2014 Renal cyst - 2020 Ureteral calculus, Left - 2019 Renal and ureteral calculus, Left - 2019        PMH Notes:  1898-09-14 00:00:00 - Note: Normal Routine History And Physical Senior Citizen (90-80)     NON-GU PMH: Asthma, Asthma - 2014 Multiple sclerosis, Multiple Sclerosis - 2014 Personal history of other diseases of the nervous system and sense organs, History of glaucoma - 2014 Personal history of other specified conditions, History of heartburn - 2014 GERD Glaucoma      FAMILY HISTORY: 1 son - Other Colon Cancer - Brother Diabetes - Mother Family Health Status - Father alive at age 77 - 78 In Family Family Health Status - Mother's Age - Runs In Family Family Health Status Number - Runs In Family Heart  Disease - Father Hypertension - Brother Prostate Cancer - Runs In Family     SOCIAL HISTORY: Marital Status: Married Preferred Language: English; Race: Black or African American Current Smoking Status: Patient has never smoked.  <DIV'  Tobacco Use Assessment Completed:  Used Tobacco in last 30 days?   Does not drink caffeine.       Notes: Marital History - Currently Married, Caffeine Use, Never A Smoker, Alcohol Use, Retired From Work     REVIEW OF SYSTEMS:       GU Review Female:  Patient denies hard to postpone urination, frequent urination, have to strain to urinate, stream starts and stops, leakage of urine, trouble starting your stream, burning /pain with urination, being pregnant, and get up at night to urinate.      Gastrointestinal (Upper):  Patient denies nausea, vomiting, and indigestion/ heartburn.      Gastrointestinal (Lower):  Patient denies diarrhea and constipation.      Constitutional:  Patient denies fever, night sweats, weight loss, and fatigue.      Musculoskeletal:  Patient denies back pain and joint pain.      Neurological:  Patient denies headaches and dizziness.      Psychologic:  Patient denies depression and anxiety.  Notes: Patient is following up from the [facility] [place] [facility].        VITAL SIGNS:         05/01/2022 11:05 AM       BP 155/74 mmHg       Pulse 69 /min       Temperature 97.1 F / 36.1 C       MULTI-SYSTEM PHYSICAL EXAMINATION:        Constitutional: Well-nourished. No physical deformities. Normally developed. Good grooming.       Cardiovascular: Normal temperature, normal extremity pulses, no swelling, no varicosities.       Skin: No paleness, no jaundice, no cyanosis. No lesion, no ulcer, no rash.       Neurologic / Psychiatric: Oriented to time, oriented to place, oriented to person. No depression, no anxiety, no agitation.       Gastrointestinal: No mass, no tenderness, no rigidity, non obese abdomen.                Complexity  of Data:   Source Of History:  Patient  Records Review:  Previous Doctor Records, Previous Hospital Records, Previous Patient Records   PROCEDURES: None   ASSESSMENT:     ICD-10 Details  1 GU:  Abnormal radiologic findings on diagnostic imaging of renal pelvis, ureter, or bladder - A21.30 Acute, Uncomplicated  2  Chronic cystitis (w/o hematuria) - N30.20 Chronic, Exacerbation   PLAN:   Schedule  Return Visit/Planned Activity: Next Available Appointment - Schedule Surgery  Document  Letter(s):  Created for Patient: Clinical Summary   Notes:  She was unable to leave a urine specimen today but reports that she will drop 1 off.She is currently on cephalexin and reports her symptoms are improved. She is ready to proceed with cystoscopy, TURBT, bilateral retrograde pyelograms, bilateral ureteroscopy, bilateral thulium laser lithotripsy and bilateral stent placement. The patient will likely need clearance from cardiology. A green sheet was placed today with the scheduler.   Signed by Daine Gravel, NP on 05/01/22 at 5:18 PM (EDT

## 2022-06-01 NOTE — Transfer of Care (Signed)
Immediate Anesthesia Transfer of Care Note  Patient: Heather Lutz [facility]  Procedure(s) Performed: CYSTOSCOPY WITH BILATERAL RETROGRADE PYELOGRAM, RIGHT URETEROSCOPY WITH HOLMIUM LASER ,STONE BASKETRY AND STENT  PLACEMENT, LEFT DIAGNOSTIC URETEROSCOPY AND STENT PLACEMENT (Bilateral) BLADDER BIOPSY    Patient Location: PACU  Anesthesia Type:General  Level of Consciousness: drowsy and patient cooperative  Airway & Oxygen Therapy: Patient Spontanous Breathing and Patient connected to face mask oxygen  Post-op Assessment: Report given to RN and Post -op Vital signs reviewed and stable  Post vital signs: Reviewed and stable  Last Vitals:  Vitals Value Taken Time  BP 149/74 06/01/22 0908  Temp    Pulse 80 06/01/22 0911  Resp 14 06/01/22 0911  SpO2 100 % 06/01/22 0911  Vitals shown include unvalidated device data.  Last Pain:  Vitals:   06/01/22 0619  TempSrc:   PainSc: 5       Patients Stated Pain Goal: 4 (95/63/87 [id number])  Complications: No notable events documented.

## 2022-06-02 ENCOUNTER — Encounter (HOSPITAL_COMMUNITY): Payer: Self-pay | Admitting: Urology

## 2022-06-02 DIAGNOSIS — C679 Malignant neoplasm of bladder, unspecified: Secondary | ICD-10-CM | POA: Diagnosis not present

## 2022-06-02 LAB — SURGICAL PATHOLOGY

## 2022-06-02 MED ORDER — OXYCODONE HCL 5 MG PO TABS: 5.0000 mg | ORAL_TABLET | ORAL | 0 refills | Status: DC | PRN

## 2022-06-02 NOTE — Progress Notes (Signed)
  Transition of Care [facility]) Screening Note   Patient Details  Name: Heather Lutz Date of Birth: 1945/11/15   Transition of Care [facility]) CM/SW Contact:    Roseanne Kaufman, RN Phone Number: 06/02/2022, 9:27 AM    Transition of Care Department [facility]) has reviewed patient and no TOC needs have been identified at this time. We will continue to monitor patient advancement through interdisciplinary progression rounds. If new patient transition needs arise, please place a TOC consult.

## 2022-06-02 NOTE — Discharge Instructions (Signed)

## 2022-06-02 NOTE — Discharge Summary (Signed)
Physician Discharge Summary  Patient ID: Heather Lutz MRN: [id number] DOB/AGE: June 08, 1946 76 y.o.  Admit date: 06/01/2022 Discharge date: 06/02/2022  Admission Diagnoses:  Discharge Diagnoses:  Principal Problem:   Renal calculi   Discharged Condition: good  Hospital Course: Patient underwent bladder biopsy and fulguration/destruction of 2.5 cm lesion, right ureteroscopy with laser lithotripsy and stone basketing with ureteral stent placement and left diagnostic ureteroscopy with stent placement.  I was unable to access the left kidney to perform ureteroscopy in the kidney.  I kept her overnight on observation given her high risk of infection to observe her and make sure she did okay.  The following day, she was afebrile and doing well.  Having some expected postoperative pain and hematuria from the stents.  She was discharged in stable condition.  Consults: None  Significant Diagnostic Studies: None  Treatments: surgery: As above  Discharge Exam: Blood pressure 129/61, pulse 88, temperature 98.3 F (36.8 C), temperature source Oral, resp. rate 17, height '5\' 3"'$  (1.6 m), weight 69.4 kg, SpO2 95 %. General appearance: alert no acute distress Adequate perfusion of extremities Nonlabored respiration Confined to the bed Some gross hematuria in canister  Disposition: Discharge disposition: 01-Home or Self Care        Allergies as of 06/02/2022       Reactions   Banana Itching   Other Itching   Walnuts, honeydew, canteloupe,   Watermelon [citrullus Vulgaris] Itching   Epinephrine Anxiety, Other (See Comments)   Hallucinations         Medication List     TAKE these medications    acetaminophen 325 MG tablet Commonly known as: TYLENOL Take 650 mg by mouth every 6 (six) hours as needed for moderate pain.   acidophilus Caps capsule Take 1 capsule by mouth daily.   baclofen 10 MG tablet Commonly known as: LIORESAL Take 1 tablet (10 mg total) by mouth 3  (three) times daily as needed for muscle spasms.   carboxymethylcellulose 0.5 % Soln Commonly known as: REFRESH PLUS Place 2 drops into both eyes 2 (two) times daily as needed (dry eyes).   cefdinir 300 MG capsule Commonly known as: OMNICEF Take 1 capsule (300 mg total) by mouth 2 (two) times daily.   ciclopirox 8 % solution Commonly known as: Penlac Apply topically at bedtime. Apply over nail and surrounding skin. Apply daily over previous coat. After seven (7) days, may remove with alcohol and continue cycle.   Cod Liver Oil Caps Take 1 capsule by mouth daily.   Cyanocobalamin 500 MCG Subl Place 500 mcg under the tongue daily.   ELDERBERRY PO Take 1 tablet by mouth daily.   FIBER ADULT GUMMIES PO Take 1 tablet by mouth daily.   Fluad Quadrivalent 0.5 ML injection Generic drug: influenza vaccine adjuvanted Inject into the muscle.   gabapentin 300 MG capsule Commonly known as: Neurontin Take 1 capsule (300 mg total) by mouth 3 (three) times daily. What changed:  when to take this reasons to take this   meloxicam 15 MG tablet Commonly known as: MOBIC Take 15 mg by mouth daily as needed for pain.   multivitamin with minerals Tabs tablet Take 1 tablet by mouth daily.   naproxen sodium 220 MG tablet Commonly known as: ALEVE Take 220 mg by mouth daily as needed (pain).   oxyCODONE 5 MG immediate release tablet Commonly known as: Oxy IR/ROXICODONE Take 1 tablet (5 mg total) by mouth every 4 (four) hours as needed for moderate pain.  We will get her scheduled for right ureteral stent removal and repeat left ureteroscopy with laser lithotripsy and ureteral stent placement on the left in 1 to 2 weeks.  Signed: Marton Redwood, III 06/02/2022, 6:00 PM

## 2022-06-09 ENCOUNTER — Other Ambulatory Visit: Payer: Self-pay | Admitting: Urology

## 2022-06-12 NOTE — Progress Notes (Signed)
For Short Stay: [place] appointment date: Date of COVID positive in last 59 days:  Bowel Prep reminder:  N/A   For Anesthesia: PCP - Cleone Slim. Rozetta Nunnery, MD Cardiologist - Dr. Abran Richard  Chest x-ray - greater than 1 year in epic EKG - greater than 1 year in epic Stress Test - 04/30/22 n care everywhere ECHO - 05/15/22 in care everywhere Cardiac Cath - greater than 2 years in epic Pacemaker/ICD device last checked: N/A Pacemaker orders received: N/A Device Rep notified: N/A  Spinal Cord Stimulator: N/A  Sleep Study -  N/A CPAP -  N/A  Fasting Blood Sugar -  N/A Checks Blood Sugar ___ N/A__ times a day Date and result of last Hgb A1c- N/A  Blood Thinner Instructions:  N/A Aspirin Instructions:  N/A Last Dose:  N/A  Activity level: Can go up a flight of stairs and activities of daily living without stopping and without chest pain and/or shortness of breath   Able to exercise without chest pain and/or shortness of breath   Unable to go up a flight of stairs without chest pain and/or shortness of breath     Anesthesia review: MS  Patient denies shortness of breath, fever, cough and chest pain at PAT appointment   Patient verbalized understanding of instructions that were given to them at the PAT appointment. Patient was also instructed that they will need to review over the PAT instructions again at home before surgery.

## 2022-06-15 ENCOUNTER — Encounter (HOSPITAL_COMMUNITY): Payer: Self-pay | Admitting: Urology

## 2022-06-15 ENCOUNTER — Other Ambulatory Visit: Payer: Self-pay

## 2022-06-20 NOTE — Anesthesia Preprocedure Evaluation (Signed)
Anesthesia Evaluation  Patient identified by MRN, date of birth, ID band Patient awake    Reviewed: Allergy & Precautions, NPO status , Patient's Chart, lab work & pertinent test results  History of Anesthesia Complications Negative for: history of anesthetic complications  Airway Mallampati: II  TM Distance: >3 FB Neck ROM: Full    Dental  (+) Missing,    Pulmonary neg pulmonary ROS,    Pulmonary exam normal        Cardiovascular + CAD  Normal cardiovascular exam     Neuro/Psych Multiple sclerosis    GI/Hepatic Neg liver ROS, GERD  ,  Endo/Other  negative endocrine ROS  Renal/GU negative Renal ROS  negative genitourinary   Musculoskeletal negative musculoskeletal ROS (+)   Abdominal   Peds  Hematology negative hematology ROS (+)   Anesthesia Other Findings glaucoma  Reproductive/Obstetrics                            Anesthesia Physical Anesthesia Plan  ASA: 2  Anesthesia Plan: General   Post-op Pain Management: Tylenol PO (pre-op)*   Induction: Intravenous  PONV Risk Score and Plan: 3 and Treatment may vary due to age or medical condition, Ondansetron and Dexamethasone  Airway Management Planned: LMA  Additional Equipment: None  Intra-op Plan:   Post-operative Plan: Extubation in OR  Informed Consent: I have reviewed the patients History and Physical, chart, labs and discussed the procedure including the risks, benefits and alternatives for the proposed anesthesia with the patient or authorized representative who has indicated his/her understanding and acceptance.     Dental advisory given  Plan Discussed with: CRNA  Anesthesia Plan Comments:        Anesthesia Quick Evaluation

## 2022-06-22 ENCOUNTER — Ambulatory Visit (HOSPITAL_COMMUNITY)
Admission: RE | Admit: 2022-06-22 | Discharge: 2022-06-22 | Disposition: A | Discharge status: Home / Self Care | Payer: Medicare Other | Source: Ambulatory Visit | Attending: Urology | Admitting: Urology

## 2022-06-22 ENCOUNTER — Ambulatory Visit (HOSPITAL_COMMUNITY): Payer: Medicare Other | Admitting: Anesthesiology

## 2022-06-22 ENCOUNTER — Encounter (HOSPITAL_COMMUNITY)
Admission: RE | Disposition: A | Discharge status: Home / Self Care | Payer: Self-pay | Source: Ambulatory Visit | Attending: Urology

## 2022-06-22 ENCOUNTER — Ambulatory Visit (HOSPITAL_BASED_OUTPATIENT_CLINIC_OR_DEPARTMENT_OTHER): Payer: Medicare Other | Admitting: Anesthesiology

## 2022-06-22 ENCOUNTER — Ambulatory Visit (HOSPITAL_COMMUNITY): Payer: Medicare Other

## 2022-06-22 ENCOUNTER — Encounter (HOSPITAL_COMMUNITY): Payer: Self-pay | Admitting: Urology

## 2022-06-22 DIAGNOSIS — N200 Calculus of kidney: Secondary | ICD-10-CM | POA: Insufficient documentation

## 2022-06-22 DIAGNOSIS — I2510 Atherosclerotic heart disease of native coronary artery without angina pectoris: Secondary | ICD-10-CM | POA: Diagnosis not present

## 2022-06-22 DIAGNOSIS — E876 Hypokalemia: Secondary | ICD-10-CM

## 2022-06-22 DIAGNOSIS — G35 Multiple sclerosis: Secondary | ICD-10-CM | POA: Diagnosis not present

## 2022-06-22 DIAGNOSIS — Z993 Dependence on wheelchair: Secondary | ICD-10-CM | POA: Diagnosis not present

## 2022-06-22 HISTORY — DX: Rash and other nonspecific skin eruption: R21

## 2022-06-22 HISTORY — PX: CYSTOSCOPY/URETEROSCOPY/HOLMIUM LASER/STENT PLACEMENT: SHX6546

## 2022-06-22 LAB — BASIC METABOLIC PANEL
Anion gap: 8 (ref 5–15)
BUN: 18 mg/dL (ref 8–23)
CO2: 25 mmol/L (ref 22–32)
Calcium: 9.6 mg/dL (ref 8.9–10.3)
Chloride: 108 mmol/L (ref 98–111)
Creatinine, Ser: 0.72 mg/dL (ref 0.44–1.00)
GFR, Estimated: >60 mL/min (ref >60)
Glucose, Bld: 102 mg/dL — ABNORMAL HIGH (ref 70–99)
Potassium: 3.8 mmol/L (ref 3.5–5.1)
Sodium: 141 mmol/L (ref 135–145)

## 2022-06-22 LAB — CBC
HCT: 40.7 % (ref 36.0–46.0)
Hemoglobin: 13 g/dL (ref 12.0–15.0)
MCH: 27.6 pg (ref 26.0–34.0)
MCHC: 31.9 g/dL (ref 30.0–36.0)
MCV: 86.4 fL (ref 80.0–100.0)
Platelets: 260 10*3/uL (ref 150–400)
RBC: 4.71 MIL/uL (ref 3.87–5.11)
RDW: 14.9 % (ref 11.5–15.5)
WBC: 5.4 10*3/uL (ref 4.0–10.5)
nRBC: 0 % (ref 0.0–0.2)

## 2022-06-22 SURGERY — CYSTOSCOPY/URETEROSCOPY/HOLMIUM LASER/STENT PLACEMENT
Anesthesia: General | Laterality: Bilateral

## 2022-06-22 MED ORDER — ONDANSETRON HCL 4 MG/2ML IJ SOLN
INTRAMUSCULAR | Status: DC | PRN
  Administered 2022-06-22: 4 mg via INTRAVENOUS

## 2022-06-22 MED ORDER — FENTANYL CITRATE PF 50 MCG/ML IJ SOSY: 25.0000 ug | PREFILLED_SYRINGE | INTRAMUSCULAR | Status: DC | PRN

## 2022-06-22 MED ORDER — LIDOCAINE HCL (CARDIAC) PF 100 MG/5ML IV SOSY
PREFILLED_SYRINGE | INTRAVENOUS | Status: DC | PRN
  Administered 2022-06-22: 80 mg via INTRAVENOUS

## 2022-06-22 MED ORDER — FENTANYL CITRATE (PF) 100 MCG/2ML IJ SOLN
INTRAMUSCULAR | Status: DC | PRN
  Administered 2022-06-22: 50 ug via INTRAVENOUS

## 2022-06-22 MED ORDER — PROPOFOL 10 MG/ML IV BOLUS
INTRAVENOUS | Status: AC
  Filled 2022-06-22: qty 20

## 2022-06-22 MED ORDER — 0.9 % SODIUM CHLORIDE (POUR BTL) OPTIME
TOPICAL | Status: DC | PRN
  Administered 2022-06-22: 1000 mL

## 2022-06-22 MED ORDER — ONDANSETRON HCL 4 MG/2ML IJ SOLN
INTRAMUSCULAR | Status: AC
  Filled 2022-06-22: qty 2

## 2022-06-22 MED ORDER — ACETAMINOPHEN 500 MG PO TABS
1000.0000 mg | ORAL_TABLET | Freq: Once | ORAL | Status: AC
  Administered 2022-06-22: 1000 mg via ORAL
  Filled 2022-06-22: qty 2

## 2022-06-22 MED ORDER — OXYCODONE HCL 5 MG PO TABS: 5.0000 mg | ORAL_TABLET | Freq: Once | ORAL | Status: DC | PRN

## 2022-06-22 MED ORDER — PROPOFOL 10 MG/ML IV BOLUS
INTRAVENOUS | Status: DC | PRN
  Administered 2022-06-22: 130 mg via INTRAVENOUS

## 2022-06-22 MED ORDER — CIPROFLOXACIN IN D5W 400 MG/200ML IV SOLN
400.0000 mg | INTRAVENOUS | Status: AC
  Administered 2022-06-22: 400 mg via INTRAVENOUS
  Filled 2022-06-22: qty 200

## 2022-06-22 MED ORDER — DEXAMETHASONE SODIUM PHOSPHATE 10 MG/ML IJ SOLN
INTRAMUSCULAR | Status: DC | PRN
  Administered 2022-06-22: 5 mg via INTRAVENOUS

## 2022-06-22 MED ORDER — EPHEDRINE SULFATE (PRESSORS) 50 MG/ML IJ SOLN
INTRAMUSCULAR | Status: DC | PRN
  Administered 2022-06-22 (×2): 5 mg via INTRAVENOUS

## 2022-06-22 MED ORDER — FENTANYL CITRATE (PF) 100 MCG/2ML IJ SOLN
INTRAMUSCULAR | Status: AC
  Filled 2022-06-22: qty 2

## 2022-06-22 MED ORDER — IOHEXOL 300 MG/ML  SOLN
INTRAMUSCULAR | Status: DC | PRN
  Administered 2022-06-22: 8 mL

## 2022-06-22 MED ORDER — PHENYLEPHRINE HCL (PRESSORS) 10 MG/ML IV SOLN
INTRAVENOUS | Status: DC | PRN
  Administered 2022-06-22: 100 ug via INTRAVENOUS
  Administered 2022-06-22: 80 ug via INTRAVENOUS
  Administered 2022-06-22 (×2): 40 ug via INTRAVENOUS
  Administered 2022-06-22: 80 ug via INTRAVENOUS
  Administered 2022-06-22: 100 ug via INTRAVENOUS
  Administered 2022-06-22: 40 ug via INTRAVENOUS

## 2022-06-22 MED ORDER — SODIUM CHLORIDE 0.9 % IR SOLN
Status: DC | PRN
  Administered 2022-06-22: 6000 mL via INTRAVESICAL

## 2022-06-22 MED ORDER — LACTATED RINGERS IV SOLN: INTRAVENOUS | Status: DC

## 2022-06-22 MED ORDER — EPHEDRINE 5 MG/ML INJ
INTRAVENOUS | Status: AC
  Filled 2022-06-22: qty 5

## 2022-06-22 MED ORDER — SULFAMETHOXAZOLE-TRIMETHOPRIM 800-160 MG PO TABS: 1.0000 | ORAL_TABLET | Freq: Two times a day (BID) | ORAL | 0 refills | Status: DC

## 2022-06-22 MED ORDER — PHENYLEPHRINE 80 MCG/ML (10ML) SYRINGE FOR IV PUSH (FOR BLOOD PRESSURE SUPPORT)
PREFILLED_SYRINGE | INTRAVENOUS | Status: AC
  Filled 2022-06-22: qty 10

## 2022-06-22 MED ORDER — LIDOCAINE HCL (PF) 2 % IJ SOLN
INTRAMUSCULAR | Status: AC
  Filled 2022-06-22: qty 5

## 2022-06-22 MED ORDER — ORAL CARE MOUTH RINSE: 15.0000 mL | Freq: Once | OROMUCOSAL | Status: AC

## 2022-06-22 MED ORDER — DEXAMETHASONE SODIUM PHOSPHATE 10 MG/ML IJ SOLN
INTRAMUSCULAR | Status: AC
  Filled 2022-06-22: qty 1

## 2022-06-22 MED ORDER — OXYCODONE HCL 5 MG/5ML PO SOLN: 5.0000 mg | Freq: Once | ORAL | Status: DC | PRN

## 2022-06-22 MED ORDER — CHLORHEXIDINE GLUCONATE 0.12 % MT SOLN
15.0000 mL | Freq: Once | OROMUCOSAL | Status: AC
  Administered 2022-06-22: 15 mL via OROMUCOSAL

## 2022-06-22 SURGICAL SUPPLY — 23 items
BAG URO CATCHER STRL LF (MISCELLANEOUS) ×1 IMPLANT
BASKET LASER NITINOL 1.9FR (BASKET) IMPLANT
BASKET ZERO TIP NITINOL 2.4FR (BASKET) IMPLANT
CATH URETERAL DUAL LUMEN 10F (MISCELLANEOUS) IMPLANT
CATH URETL OPEN END 6FR 70 (CATHETERS) ×1 IMPLANT
CLOTH BEACON ORANGE TIMEOUT ST (SAFETY) ×1 IMPLANT
EXTRACTOR STONE 1.7FRX115CM (UROLOGICAL SUPPLIES) IMPLANT
GLOVE BIO SURGEON STRL SZ7.5 (GLOVE) ×1 IMPLANT
GOWN STRL REUS W/ TWL XL LVL3 (GOWN DISPOSABLE) ×1 IMPLANT
GOWN STRL REUS W/TWL XL LVL3 (GOWN DISPOSABLE) ×1
GUIDEWIRE ANG ZIPWIRE 038X150 (WIRE) IMPLANT
GUIDEWIRE STR DUAL SENSOR (WIRE) ×1 IMPLANT
KIT TURNOVER KIT A (KITS) IMPLANT
LASER FIB FLEXIVA PULSE ID 365 (Laser) IMPLANT
MANIFOLD NEPTUNE II (INSTRUMENTS) ×1 IMPLANT
PACK CYSTO (CUSTOM PROCEDURE TRAY) ×1 IMPLANT
SHEATH NAVIGATOR HD 11/13X28 (SHEATH) IMPLANT
SHEATH NAVIGATOR HD 11/13X36 (SHEATH) IMPLANT
STENT URET 6FRX24 CONTOUR (STENTS) IMPLANT
TRACTIP FLEXIVA PULS ID 200XHI (Laser) IMPLANT
TRACTIP FLEXIVA PULSE ID 200 (Laser) ×1
TUBING CONNECTING 10 (TUBING) ×1 IMPLANT
TUBING UROLOGY SET (TUBING) ×1 IMPLANT

## 2022-06-22 NOTE — Anesthesia Postprocedure Evaluation (Signed)
Anesthesia Post Note  Patient: Heather Lutz  Procedure(s) Performed: CYSTOSCOPY RIGHT URETERAL STENT REMOVAL LEFT URETEROSCOPY/HOLMIUM LASER/STENT PLACEMENT (Bilateral)     Patient location during evaluation: PACU Anesthesia Type: General Level of consciousness: awake and alert Pain management: pain level controlled Vital Signs Assessment: post-procedure vital signs reviewed and stable Respiratory status: spontaneous breathing, nonlabored ventilation and respiratory function stable Cardiovascular status: blood pressure returned to baseline Postop Assessment: no apparent nausea or vomiting Anesthetic complications: no   No notable events documented.  Last Vitals:  Vitals:   06/22/22 0915 06/22/22 0930  BP: 135/71 132/69  Pulse: 77 79  Resp: 14   Temp:    SpO2: 97% 98%    Last Pain:  Vitals:   06/22/22 0915  TempSrc:   PainSc: 0-No pain                 [name]

## 2022-06-22 NOTE — Op Note (Signed)
Operative Note  Preoperative diagnosis:  1.  Left renal calculi  Postoperative diagnosis: 1.  Left renal calculi  Procedure(s): 1.  Cystoscopy with right ureteral stent removal, left retrograde pyelogram, left ureteroscopy with laser lithotripsy and stone extraction, left ureteral stent exchange  Surgeon: Link Snuffer, MD  Assistants: None  Anesthesia: General  Complications: None immediate  EBL: Minimal  Specimens: 1.  Renal calculi  Drains/Catheters: 1.  6 x 24 double-J ureteral stent on a string  Intraoperative findings: 1.  Normal urethra and bladder except for some edematous mucosa secondary to the ureteral stents.  Right ureteral stent removed.  No obvious calculi on fluoroscopic x-ray on the right. 2.  Multiple radiopaque left renal calculi, largest was about 1 cm in the lower mid pole.  Small calculi that were clustered in the lower left pole.  All stones treated and the larger stones basket extracted.  Retrograde pyelogram at the end revealed no hydronephrosis and no obvious filling defect.  Indication: 76 year old female status post right ureteroscopy with laser lithotripsy and ureteral stent placement as well as left ureteral stent placement presents for right stent removal and left ureteroscopy.  Description of procedure:  The patient was identified and consent was obtained.  The patient was taken to the operating room and placed in the supine position.  The patient was placed under general anesthesia.  Perioperative antibiotics were administered.  The patient was placed in dorsal lithotomy.  Patient was prepped and draped in a standard sterile fashion and a timeout was performed.  A 21 French rigid cystoscope was advanced into the urethra and into the bladder.  Complete cystoscopy was performed with findings noted above.  The right stent was grasped and removed.  The left stent was grasped and pulled just beyond the urethral meatus.  A wire was advanced through this  and up to the kidney under fluoroscopic guidance and the stent was withdrawn.  An 11 x 13 ureteral access sheath was advanced over the wire under continuous fluoroscopic guidance up to the mid ureter.  Inner sheath was withdrawn.  A second wire was advanced through the sheath and into the kidney under fluoroscopic guidance.  Sheath was withdrawn.  1 the wires was secured to the drape as a safety wire.  The other wire was used to advance an 11 x 13 ureteral access sheath over the wire and up the ureter under continuous fluoroscopic guidance up to the renal pelvis.  Inner sheath and wire were withdrawn.  Digital ureteroscopy was performed and a large stone was laser fragmented on dust settings to smaller fragments.  Larger stones were then basket extracted.  In the lower pole, there were only small calculi.  These were laser fragmented on dust settings to tiny fragments.  There were no fragments large enough for basket extraction.  Once all the stones were treated, I shot a retrograde pyelogram through the scope with no abnormal findings.  I withdrew the scope along with the access sheath visualizing the ureter upon removal.  There was no ureteral stone and no ureteral injury was identified.  I advanced a 6 x 24 double-J ureteral stent was advanced over the wire under fluoroscopic guidance.  Wire was withdrawn.  Fluoroscopy confirmed proximal and distal placement.  String was placed in the vagina.  I drained the bladder with the cystoscope and this concluded the operation.  Patient tolerated the procedure well and was stable postoperative.  Plan: She will remove the stent in 1 week.  If the  stent starts to fall out, she will go ahead and remove it.  1 week of antibiotics given given her history of UTIs.

## 2022-06-22 NOTE — Discharge Instructions (Signed)
[facility] [phone] Post Ureteroscopy With or Without Stent Instructions  You may pull the stent next Monday morning at 7 AM.  If the stent starts to come out just go ahead and pull it out however.  Definitions:  Ureter: The duct that transports urine from the kidney to the bladder. Stent:   A plastic hollow tube that is placed into the ureter, from the kidney to the                 bladder to prevent the ureter from swelling shut.  GENERAL INSTRUCTIONS:  Despite the fact that no skin incisions were used, the area around the ureter and bladder is raw and irritated. The stent is a foreign body which will further irritate the bladder wall. This irritation is manifested by increased frequency of urination, both day and night, and by an increase in the urge to urinate. In some, the urge to urinate is present almost always. Sometimes the urge is strong enough that you may not be able to stop yourself from urinating. The only real cure is to remove the stent and then give time for the bladder wall to heal which can't be done until the danger of the ureter swelling shut has passed, which varies.  You may see some blood in your urine while the stent is in place and a few days afterwards. Do not be alarmed, even if the urine was clear for a while. Get off your feet and drink lots of fluids until clearing occurs. If you start to pass clots or don't improve, call us.  DIET: You may return to your normal diet immediately. Because of the raw surface of your bladder, alcohol, spicy foods, acid type foods and drinks with caffeine may cause irritation or frequency and should be used in moderation. To keep your urine flowing freely and to avoid constipation, drink plenty of fluids during the day ( 8-10 glasses ). Tip: Avoid cranberry juice because it is very acidic.  ACTIVITY: Your physical activity doesn't need to be restricted. However, if you are very active, you may see some blood in  your urine. We suggest that you reduce your activity under these circumstances until the bleeding has stopped.  BOWELS: It is important to keep your bowels regular during the postoperative period. Straining with bowel movements can cause bleeding. A bowel movement every other day is reasonable. Use a mild laxative if needed, such as Milk of Magnesia 2-3 tablespoons, or 2 Dulcolax tablets. Call if you continue to have problems. If you have been taking narcotics for pain, before, during or after your surgery, you may be constipated. Take a laxative if necessary.   MEDICATION: You should resume your pre-surgery medications unless told not to. You may take oxybutynin or flomax if prescribed for bladder spasms or discomfort from the stent Take pain medication as directed for pain refractory to conservative management  PROBLEMS YOU SHOULD REPORT TO [place]: Fevers over 100.5 Fahrenheit. Heavy bleeding, or clots ( See above notes about blood in urine ). Inability to urinate. Drug reactions ( hives, rash, nausea, vomiting, diarrhea ). Severe burning or pain with urination that is not improving.

## 2022-06-22 NOTE — Interval H&P Note (Signed)
History and Physical Interval Note:  06/22/2022 7:24 AM  Heather Lutz  has presented today for surgery, with the diagnosis of LEFT RENAL STONE.  The various methods of treatment have been discussed with the patient and family. After consideration of risks, benefits and other options for treatment, the patient has consented to  Procedure(s): CYSTOSCOPY RIGHT URETERAL STENT REMOVAL LEFT URETEROSCOPY/HOLMIUM LASER/STENT PLACEMENT (Bilateral) as a surgical intervention.  The patient's history has been reviewed, patient examined, no change in status, stable for surgery.  I have reviewed the patient's chart and labs.  Questions were answered to the patient's satisfaction.     Marton Redwood, III

## 2022-06-22 NOTE — Anesthesia Procedure Notes (Signed)
Procedure Name: LMA Insertion Date/Time: 06/22/2022 7:41 AM  Performed by: Garrel Ridgel, CRNAPre-anesthesia Checklist: Patient identified, Emergency Drugs available, Suction available and Patient being monitored Patient Re-evaluated:Patient Re-evaluated prior to induction Oxygen Delivery Method: Circle system utilized Preoxygenation: Pre-oxygenation with 100% oxygen Induction Type: IV induction Ventilation: Mask ventilation without difficulty LMA: LMA inserted LMA Size: 3.0 Tube type: Oral Number of attempts: 1 Placement Confirmation: positive ETCO2 Tube secured with: Tape Dental Injury: Teeth and Oropharynx as per pre-operative assessment

## 2022-06-22 NOTE — Transfer of Care (Signed)
Immediate Anesthesia Transfer of Care Note  Patient: Heather Lutz [facility]  Procedure(s) Performed: CYSTOSCOPY RIGHT URETERAL STENT REMOVAL LEFT URETEROSCOPY/HOLMIUM LASER/STENT PLACEMENT (Bilateral)  Patient Location: PACU  Anesthesia Type:General  Level of Consciousness: oriented, drowsy and patient cooperative  Airway & Oxygen Therapy: Patient Spontanous Breathing and Patient connected to face mask oxygen  Post-op Assessment: Report given to RN and Post -op Vital signs reviewed and stable  Post vital signs: Reviewed and stable  Last Vitals:  Vitals Value Taken Time  BP 151/68 06/22/22 0846  Temp    Pulse 85 06/22/22 0848  Resp 16 06/22/22 0848  SpO2 99 % 06/22/22 0848  Vitals shown include unvalidated device data.  Last Pain:  Vitals:   06/22/22 0635  TempSrc:   PainSc: 5       Patients Stated Pain Goal: 4 (50/03/70 4888)  Complications: No notable events documented.

## 2022-06-23 ENCOUNTER — Encounter (HOSPITAL_COMMUNITY): Payer: Self-pay | Admitting: Urology

## 2022-10-12 ENCOUNTER — Other Ambulatory Visit (HOSPITAL_BASED_OUTPATIENT_CLINIC_OR_DEPARTMENT_OTHER): Payer: Self-pay

## 2022-10-12 MED ORDER — COMIRNATY 30 MCG/0.3ML IM SUSY
PREFILLED_SYRINGE | INTRAMUSCULAR | 0 refills | Status: DC
  Filled 2022-10-12: qty 0.3, 1d supply, fill #0

## 2023-02-01 ENCOUNTER — Encounter (INDEPENDENT_AMBULATORY_CARE_PROVIDER_SITE_OTHER): Payer: Medicare Other | Admitting: Ophthalmology

## 2023-08-30 ENCOUNTER — Other Ambulatory Visit (HOSPITAL_BASED_OUTPATIENT_CLINIC_OR_DEPARTMENT_OTHER): Payer: Self-pay

## 2023-11-15 ENCOUNTER — Other Ambulatory Visit: Payer: Self-pay

## 2023-11-15 ENCOUNTER — Emergency Department (HOSPITAL_BASED_OUTPATIENT_CLINIC_OR_DEPARTMENT_OTHER)

## 2023-11-15 ENCOUNTER — Encounter (HOSPITAL_BASED_OUTPATIENT_CLINIC_OR_DEPARTMENT_OTHER): Payer: Self-pay

## 2023-11-15 ENCOUNTER — Inpatient Hospital Stay (HOSPITAL_BASED_OUTPATIENT_CLINIC_OR_DEPARTMENT_OTHER)
Admission: EM | Admit: 2023-11-15 | Discharge: 2023-11-20 | DRG: 377 | Disposition: A | Discharge status: Home Health | Attending: Internal Medicine | Admitting: Internal Medicine

## 2023-11-15 DIAGNOSIS — Z90710 Acquired absence of both cervix and uterus: Secondary | ICD-10-CM

## 2023-11-15 DIAGNOSIS — K254 Chronic or unspecified gastric ulcer with hemorrhage: Principal | ICD-10-CM | POA: Diagnosis present

## 2023-11-15 DIAGNOSIS — D649 Anemia, unspecified: Secondary | ICD-10-CM | POA: Diagnosis present

## 2023-11-15 DIAGNOSIS — K648 Other hemorrhoids: Secondary | ICD-10-CM | POA: Diagnosis present

## 2023-11-15 DIAGNOSIS — J121 Respiratory syncytial virus pneumonia: Secondary | ICD-10-CM | POA: Diagnosis present

## 2023-11-15 DIAGNOSIS — K219 Gastro-esophageal reflux disease without esophagitis: Secondary | ICD-10-CM | POA: Diagnosis present

## 2023-11-15 DIAGNOSIS — Z8249 Family history of ischemic heart disease and other diseases of the circulatory system: Secondary | ICD-10-CM

## 2023-11-15 DIAGNOSIS — K253 Acute gastric ulcer without hemorrhage or perforation: Secondary | ICD-10-CM

## 2023-11-15 DIAGNOSIS — Z1152 Encounter for screening for COVID-19: Secondary | ICD-10-CM

## 2023-11-15 DIAGNOSIS — K449 Diaphragmatic hernia without obstruction or gangrene: Secondary | ICD-10-CM | POA: Diagnosis present

## 2023-11-15 DIAGNOSIS — R54 Age-related physical debility: Secondary | ICD-10-CM | POA: Diagnosis present

## 2023-11-15 DIAGNOSIS — Z7982 Long term (current) use of aspirin: Secondary | ICD-10-CM

## 2023-11-15 DIAGNOSIS — R531 Weakness: Secondary | ICD-10-CM | POA: Diagnosis not present

## 2023-11-15 DIAGNOSIS — G35 Multiple sclerosis: Secondary | ICD-10-CM | POA: Diagnosis present

## 2023-11-15 DIAGNOSIS — H548 Legal blindness, as defined in USA: Secondary | ICD-10-CM | POA: Diagnosis present

## 2023-11-15 DIAGNOSIS — Z7401 Bed confinement status: Secondary | ICD-10-CM

## 2023-11-15 DIAGNOSIS — E876 Hypokalemia: Secondary | ICD-10-CM | POA: Diagnosis present

## 2023-11-15 DIAGNOSIS — B338 Other specified viral diseases: Principal | ICD-10-CM

## 2023-11-15 DIAGNOSIS — K644 Residual hemorrhoidal skin tags: Secondary | ICD-10-CM | POA: Diagnosis present

## 2023-11-15 DIAGNOSIS — H409 Unspecified glaucoma: Secondary | ICD-10-CM | POA: Diagnosis present

## 2023-11-15 DIAGNOSIS — N200 Calculus of kidney: Secondary | ICD-10-CM | POA: Diagnosis present

## 2023-11-15 DIAGNOSIS — K2970 Gastritis, unspecified, without bleeding: Secondary | ICD-10-CM | POA: Diagnosis present

## 2023-11-15 DIAGNOSIS — J189 Pneumonia, unspecified organism: Secondary | ICD-10-CM

## 2023-11-15 DIAGNOSIS — Z993 Dependence on wheelchair: Secondary | ICD-10-CM

## 2023-11-15 DIAGNOSIS — R195 Other fecal abnormalities: Secondary | ICD-10-CM

## 2023-11-15 DIAGNOSIS — D509 Iron deficiency anemia, unspecified: Secondary | ICD-10-CM

## 2023-11-15 DIAGNOSIS — Z79899 Other long term (current) drug therapy: Secondary | ICD-10-CM

## 2023-11-15 DIAGNOSIS — M810 Age-related osteoporosis without current pathological fracture: Secondary | ICD-10-CM | POA: Diagnosis present

## 2023-11-15 DIAGNOSIS — Z888 Allergy status to other drugs, medicaments and biological substances status: Secondary | ICD-10-CM

## 2023-11-15 DIAGNOSIS — D62 Acute posthemorrhagic anemia: Secondary | ICD-10-CM | POA: Diagnosis present

## 2023-11-15 LAB — CBC WITH DIFFERENTIAL/PLATELET
Abs Immature Granulocytes: 0.01 10*3/uL (ref 0.00–0.07)
Basophils Absolute: 0 10*3/uL (ref 0.0–0.1)
Basophils Relative: 0 %
Eosinophils Absolute: 0 10*3/uL (ref 0.0–0.5)
Eosinophils Relative: 0 %
HCT: 19.5 % — ABNORMAL LOW (ref 36.0–46.0)
Hemoglobin: 5.6 g/dL — CL (ref 12.0–15.0)
Immature Granulocytes: 0 %
Lymphocytes Relative: 13 %
Lymphs Abs: 0.6 10*3/uL — ABNORMAL LOW (ref 0.7–4.0)
MCH: 18.6 pg — ABNORMAL LOW (ref 26.0–34.0)
MCHC: 28.7 g/dL — ABNORMAL LOW (ref 30.0–36.0)
MCV: 64.8 fL — ABNORMAL LOW (ref 80.0–100.0)
Monocytes Absolute: 0.7 10*3/uL (ref 0.1–1.0)
Monocytes Relative: 16 %
Neutro Abs: 3.1 10*3/uL (ref 1.7–7.7)
Neutrophils Relative %: 71 %
Platelets: 237 10*3/uL (ref 150–400)
RBC: 3.01 MIL/uL — ABNORMAL LOW (ref 3.87–5.11)
RDW: 23.4 % — ABNORMAL HIGH (ref 11.5–15.5)
Smear Review: NORMAL
WBC: 4.3 10*3/uL (ref 4.0–10.5)
nRBC: 0.5 % — ABNORMAL HIGH (ref 0.0–0.2)

## 2023-11-15 LAB — BASIC METABOLIC PANEL
Anion gap: 11 (ref 5–15)
BUN: 20 mg/dL (ref 8–23)
CO2: 21 mmol/L — ABNORMAL LOW (ref 22–32)
Calcium: 8.5 mg/dL — ABNORMAL LOW (ref 8.9–10.3)
Chloride: 101 mmol/L (ref 98–111)
Creatinine, Ser: 0.94 mg/dL (ref 0.44–1.00)
GFR, Estimated: >60 mL/min (ref >60)
Glucose, Bld: 101 mg/dL — ABNORMAL HIGH (ref 70–99)
Potassium: 3.6 mmol/L (ref 3.5–5.1)
Sodium: 133 mmol/L — ABNORMAL LOW (ref 135–145)

## 2023-11-15 LAB — OCCULT BLOOD X 1 CARD TO LAB, STOOL: Fecal Occult Bld: POSITIVE — AB

## 2023-11-15 LAB — RESP PANEL BY RT-PCR (RSV, FLU A&B, COVID)  RVPGX2
Influenza A by PCR: NEGATIVE
Influenza B by PCR: NEGATIVE
Resp Syncytial Virus by PCR: POSITIVE — AB
SARS Coronavirus 2 by RT PCR: NEGATIVE

## 2023-11-15 MED ORDER — IOHEXOL 300 MG/ML  SOLN
100.0000 mL | Freq: Once | INTRAMUSCULAR | Status: AC | PRN
  Administered 2023-11-15: 100 mL via INTRAVENOUS

## 2023-11-15 MED ORDER — SODIUM CHLORIDE 0.9 % IV BOLUS
1000.0000 mL | Freq: Once | INTRAVENOUS | Status: AC
  Administered 2023-11-15: 1000 mL via INTRAVENOUS

## 2023-11-15 MED ORDER — SODIUM CHLORIDE 0.9 % IV SOLN
500.0000 mg | Freq: Once | INTRAVENOUS | Status: AC
  Administered 2023-11-15: 500 mg via INTRAVENOUS
  Filled 2023-11-15: qty 5

## 2023-11-15 MED ORDER — SODIUM CHLORIDE 0.9 % IV SOLN
1.0000 g | Freq: Once | INTRAVENOUS | Status: AC
  Administered 2023-11-15: 1 g via INTRAVENOUS
  Filled 2023-11-15: qty 10

## 2023-11-15 NOTE — ED Provider Notes (Addendum)
 [place] [facility] Provider Note   CSN: [id number] Arrival date & time: 11/15/23  1534     History  Chief Complaint  Patient presents with   Weakness    Heather Lutz is a 78 y.o. female.  Patient with history of MS, wheelchair bound/bed ridden at baseline --presents to the emergency department for evaluation of bodyaches, cough, shortness of breath.  Symptoms started about 5 days ago.  Tmax at home in the 99's.  No vomiting or diarrhea.  She is drinking fluids.  She states that she feels dry.  She has some back pain.       Home Medications Prior to Admission medications   Medication Sig Start Date End Date Taking? Authorizing Provider  acetaminophen (TYLENOL) 500 MG tablet Take 1,000 mg by mouth every 6 (six) hours as needed for mild pain.    [provider]  acidophilus (RISAQUAD) CAPS capsule Take 1 capsule by mouth daily.    [provider]  baclofen (LIORESAL) 10 MG tablet Take 1 tablet (10 mg total) by mouth 3 (three) times daily as needed for muscle spasms. 09/22/17   Benjiman Core, MD  carboxymethylcellulose (REFRESH PLUS) 0.5 % SOLN Place 2 drops into both eyes 2 (two) times daily as needed (dry eyes).    [provider]  [facility] Liver Oil CAPS Take 1 capsule by mouth daily.    [provider]  COVID-19 mRNA vaccine [phone] (COMIRNATY) syringe Inject into the muscle. 10/12/22     Cyanocobalamin 500 MCG SUBL Place 500 mcg under the tongue daily.    [provider]  ELDERBERRY PO Take 1 tablet by mouth daily.    [provider]  FIBER ADULT GUMMIES PO Take 1 tablet by mouth daily.    [provider]  gabapentin (NEURONTIN) 300 MG capsule Take 1 capsule (300 mg total) by mouth 3 (three) times daily. Patient taking differently: Take 300 mg by mouth daily as needed (pain). 07/01/21 05/22/22  Sater, Pearletha Furl, MD  meloxicam (MOBIC) 15 MG tablet Take 15 mg by mouth daily as  needed for pain. 03/11/21   [provider]  Multiple Vitamin (MULTIVITAMIN WITH MINERALS) TABS tablet Take 1 tablet by mouth daily.    [provider]  naproxen sodium (ALEVE) 220 MG tablet Take 220 mg by mouth daily as needed (pain).    [provider]  oxyCODONE (OXY IR/ROXICODONE) 5 MG immediate release tablet Take 1 tablet (5 mg total) by mouth every 4 (four) hours as needed for moderate pain. 06/02/22   Crista Elliot, MD  sulfamethoxazole-trimethoprim (BACTRIM DS) 800-160 MG tablet Take 1 tablet by mouth 2 (two) times daily. 06/22/22   Crista Elliot, MD      Allergies    Banana, Other, Watermelon [citrullus vulgaris], and Epinephrine    Review of Systems   Review of Systems  Physical Exam Updated Vital Signs BP 135/79 (BP Location: Left Arm)   Pulse 92   Temp 97.7 F (36.5 C)   Resp 19   Ht 5\' 3"  (1.6 m)   Wt 65.8 kg   SpO2 98%   BMI 25.69 kg/m   Physical Exam Vitals and nursing note reviewed. Exam conducted with a chaperone present.  Constitutional:      General: She is not in acute distress.    Appearance: She is well-developed.  HENT:     Head: Normocephalic and atraumatic.     Right Ear: External  ear normal.     Left Ear: External ear normal.     Nose: Nose normal.  Eyes:     Conjunctiva/sclera: Conjunctivae normal.  Cardiovascular:     Rate and Rhythm: Normal rate and regular rhythm.     Heart sounds: No murmur heard. Pulmonary:     Effort: No respiratory distress.     Breath sounds: Decreased air movement present. No wheezing, rhonchi or rales.  Abdominal:     Palpations: Abdomen is soft.     Tenderness: There is no abdominal tenderness. There is no guarding or rebound.  Genitourinary:    Rectum: Guaiac result positive.     Comments: Stool was scant, not bloody or melanotic Musculoskeletal:     Cervical back: Normal range of motion and neck supple.     Right lower leg: No edema.     Left lower leg: No edema.  Skin:     General: Skin is warm and dry.     Findings: No rash.  Neurological:     General: No focal deficit present.     Mental Status: She is alert. Mental status is at baseline.     Motor: No weakness.  Psychiatric:        Mood and Affect: Mood normal.     ED Results / Procedures / Treatments   Labs (all labs ordered are listed, but only abnormal results are displayed) Labs Reviewed  RESP PANEL BY RT-PCR (RSV, FLU A&B, COVID)  RVPGX2 - Abnormal; Notable for the following components:      Result Value   Resp Syncytial Virus by PCR POSITIVE (*)    All other components within normal limits  CBC WITH DIFFERENTIAL/PLATELET - Abnormal; Notable for the following components:   RBC 3.01 (*)    Hemoglobin 5.6 (*)    HCT 19.5 (*)    MCV 64.8 (*)    MCH 18.6 (*)    MCHC 28.7 (*)    RDW 23.4 (*)    nRBC 0.5 (*)    Lymphs Abs 0.6 (*)    All other components within normal limits  BASIC METABOLIC PANEL - Abnormal; Notable for the following components:   Sodium 133 (*)    CO2 21 (*)    Glucose, Bld 101 (*)    Calcium 8.5 (*)    All other components within normal limits  OCCULT BLOOD X 1 CARD TO LAB, STOOL - Abnormal; Notable for the following components:   Fecal Occult Bld POSITIVE (*)    All other components within normal limits  HEMOGLOBIN AND HEMATOCRIT, BLOOD - Abnormal; Notable for the following components:   Hemoglobin 5.1 (*)    HCT 17.7 (*)    All other components within normal limits  URINALYSIS, ROUTINE W REFLEX MICROSCOPIC  VITAMIN B12  FOLATE  IRON AND TIBC  FERRITIN  RETICULOCYTES    ED ECG REPORT   Date: 11/15/2023  Rate: 88  Rhythm: normal sinus rhythm  QRS Axis: left  Intervals: normal  ST/T Wave abnormalities: nonspecific T wave changes  Conduction Disutrbances:none  Narrative Interpretation:   Old EKG Reviewed: changes noted from 06/2021, now left axis and t wave abnl  I have personally reviewed the EKG tracing and agree with the computerized printout as  noted.   Radiology DG Chest 2 View Result Date: 11/15/2023 CLINICAL DATA:  RSV, shortness of breath EXAM: CHEST - 2 VIEW COMPARISON:  06/11/2021 FINDINGS: Stable cardiomediastinal silhouette. Bilateral lower lobe airspace opacities. No pleural effusion or pneumothorax. No  displaced rib fractures. IMPRESSION: Multifocal pneumonia. Follow-up after treatment is recommended to ensure resolution. Electronically Signed   By: Minerva Fester M.D.   On: 11/15/2023 22:32    Procedures Procedures    Medications Ordered in ED Medications  cefTRIAXone (ROCEPHIN) 1 g in sodium chloride 0.9 % 100 mL IVPB (has no administration in time range)  azithromycin (ZITHROMAX) 500 mg in sodium chloride 0.9 % 250 mL IVPB (has no administration in time range)  sodium chloride 0.9 % bolus 1,000 mL (0 mLs Intravenous Stopped 11/15/23 2207)  iohexol (OMNIPAQUE) 300 MG/ML solution 100 mL (100 mLs Intravenous Contrast Given 11/15/23 2123)    ED Course/ Medical Decision Making/ A&P    Patient seen and examined. History obtained directly from patient.   Labs/EKG: Viral panel ordered in triage was positive for RSV.  Ordered CBC, BMP, UA.  Imaging: Ordered chest x-ray.  Medications/Fluids: Ordered: IV fluid bolus.   Most recent vital signs reviewed and are as follows: BP 135/79 (BP Location: Left Arm)   Pulse 92   Temp 97.7 F (36.5 C)   Resp 19   Ht 5\' 3"  (1.6 m)   Wt 65.8 kg   SpO2 98%   BMI 25.69 kg/m   Initial impression: RSV infection  9:10 PM Reassessment performed. Patient appears stable, unchanged.  Labs personally reviewed and interpreted including: CBC with normal white blood cell count, microcytic anemia 5.6 hemoglobin; BMP unremarkable; Hemoccult positive.  Stool was not grossly melanotic or bloody.  Imaging: CT abdomen pelvis ordered.  Chest x-ray, personally reviewed and interpreted appears negative.  Reviewed pertinent lab work and imaging with patient at bedside. Questions answered.    Most current vital signs reviewed and are as follows: BP (!) 113/44   Pulse 99   Temp 97.7 F (36.5 C)   Resp 18   Ht 5\' 3"  (1.6 m)   Wt 65.8 kg   SpO2 96%   BMI 25.69 kg/m   Plan: Will need admission to the hospital for weakness, anemia workup.  10:10 PM I discussed case with [facility] hospitalist, Dr. Janalyn Shy.  CT has been performed, awaiting results.  She requests ED to ED transfer for blood transfusion.  Dr[facility] Silverio Lay at [facility] accepts.  CRITICAL CARE Performed by: Renne Crigler PA-C Total critical care time: 40 minutes Critical care time was exclusive of separately billable procedures and treating other patients. Critical care was necessary to treat or prevent imminent or life-threatening deterioration. Critical care was time spent personally by me on the following activities: development of treatment plan with patient and/or surrogate as well as nursing, discussions with consultants, evaluation of patient's response to treatment, examination of patient, obtaining history from patient or surrogate, ordering and performing treatments and interventions, ordering and review of laboratory studies, ordering and review of radiographic studies, pulse oximetry and re-evaluation of patient's condition.  11:06 PM I did look at the patient's CT.  She does have some dense infiltrates in the bilateral lobes of the lungs.  Chest x-ray suggestive of multifocal pneumonia.  Given new oxygen requirement, will cover for community-acquired pneumonia with ceftriaxone and azithromycin.  EKG reviewed.  I have not yet received callback from on-call GI for consult request.   11:53 PM Dr. Adela Lank aware of patient at shift change. I did send secure chat to Dr. Loreta Ave as well. No reply to this point.  Medical Decision Making Amount and/or Complexity of Data Reviewed Labs: ordered. Radiology: ordered.  Risk Prescription drug management. Decision regarding  hospitalization.   Patient with RSV, shortness of breath and weakness in setting of MS, found to have anemia.  She will need to be admitted for workup.  Heme positive stool, but no heavy volume of bleeding suspected.  She is stable.    Final Clinical Impression(s) / ED Diagnoses Final diagnoses:  RSV infection  Microcytic anemia  Heme positive stool  Multifocal pneumonia    Rx / DC Orders ED Discharge Orders     None         Renne Crigler, PA-C 11/15/23 2308    Renne Crigler, PA-C 11/15/23 2354    Arby Barrette, MD 11/19/23 2042

## 2023-11-15 NOTE — Plan of Care (Deleted)
 Plan of Care Note for accepted transfer  Patient: Heather Lutz              QMV:[id number]  DOA: 11/15/2023     Facility requesting transfer: [facility] Requesting Provider: Renne Crigler, PA   Reason for transfer: Acute development of anemia in the setting of occult GI bleed, acute hypoxic respiratory failure in the setting of RSV infection.  Facility course: 78 year old female history of MS for last 30 years, osteoporosis history of cataract extraction, history of nephrolithiasis and baseline wheelchair-bound presented to emergency department with complaining of generalized body ache, cough, shortness of breath which has been ongoing for last 5 days.  Patient also has low-grade temperature.  Denies any nausea and vomiting.  At presentation to ED patient O2 sat dropped to 88% room air.  Currently O2 sat 96 to 100% on 2 L oxygen otherwise hemodynamically stable. Respiratory panel positive with RSV. CBC showing low hemoglobin 5.6, low MCV and hematocrit.  Normal WBC and platelet count.  Baseline hemoglobin around 13-14. BMP showing low sodium 133 otherwise unremarkable.  FOBT positive.  Pending chest x-ray and CT abdomen pelvis. Patient does not have any hematemesis or melena.  Unable to obtain the type-screen screen and blood transfusion at present at [facility].  Requested ED physician to transfer the patient ED to ED at [facility] in the setting of acute development of anemia low hemoglobin 5.6.  Patient will need blood transfusion immediate in the setting of symptomatic anemia and occult GI bleed.  Also requested to consult GI for evaluation for lower GI bleed.  Deferring admission at this time.  Please reach to [facility] service upon arrival to [facility] emergency department.   Author: Tereasa Coop, MD  11/15/2023  [facility]

## 2023-11-15 NOTE — ED Triage Notes (Signed)
 Pt states that she is congested, weak, and body aches. Denies fever. States that she just aches and had a low grade fever a few days ago.

## 2023-11-16 DIAGNOSIS — M810 Age-related osteoporosis without current pathological fracture: Secondary | ICD-10-CM | POA: Diagnosis present

## 2023-11-16 DIAGNOSIS — Z888 Allergy status to other drugs, medicaments and biological substances status: Secondary | ICD-10-CM | POA: Diagnosis not present

## 2023-11-16 DIAGNOSIS — N200 Calculus of kidney: Secondary | ICD-10-CM | POA: Diagnosis present

## 2023-11-16 DIAGNOSIS — Z7401 Bed confinement status: Secondary | ICD-10-CM | POA: Diagnosis not present

## 2023-11-16 DIAGNOSIS — D62 Acute posthemorrhagic anemia: Secondary | ICD-10-CM | POA: Diagnosis present

## 2023-11-16 DIAGNOSIS — R195 Other fecal abnormalities: Secondary | ICD-10-CM | POA: Diagnosis not present

## 2023-11-16 DIAGNOSIS — K644 Residual hemorrhoidal skin tags: Secondary | ICD-10-CM | POA: Diagnosis present

## 2023-11-16 DIAGNOSIS — K648 Other hemorrhoids: Secondary | ICD-10-CM | POA: Diagnosis present

## 2023-11-16 DIAGNOSIS — H409 Unspecified glaucoma: Secondary | ICD-10-CM | POA: Diagnosis present

## 2023-11-16 DIAGNOSIS — H548 Legal blindness, as defined in USA: Secondary | ICD-10-CM | POA: Diagnosis present

## 2023-11-16 DIAGNOSIS — R54 Age-related physical debility: Secondary | ICD-10-CM | POA: Diagnosis present

## 2023-11-16 DIAGNOSIS — D509 Iron deficiency anemia, unspecified: Secondary | ICD-10-CM | POA: Diagnosis not present

## 2023-11-16 DIAGNOSIS — K254 Chronic or unspecified gastric ulcer with hemorrhage: Secondary | ICD-10-CM | POA: Diagnosis present

## 2023-11-16 DIAGNOSIS — D649 Anemia, unspecified: Secondary | ICD-10-CM | POA: Diagnosis present

## 2023-11-16 DIAGNOSIS — K3189 Other diseases of stomach and duodenum: Secondary | ICD-10-CM | POA: Diagnosis not present

## 2023-11-16 DIAGNOSIS — Z8249 Family history of ischemic heart disease and other diseases of the circulatory system: Secondary | ICD-10-CM | POA: Diagnosis not present

## 2023-11-16 DIAGNOSIS — J121 Respiratory syncytial virus pneumonia: Secondary | ICD-10-CM | POA: Diagnosis present

## 2023-11-16 DIAGNOSIS — K259 Gastric ulcer, unspecified as acute or chronic, without hemorrhage or perforation: Secondary | ICD-10-CM | POA: Diagnosis not present

## 2023-11-16 DIAGNOSIS — Z79899 Other long term (current) drug therapy: Secondary | ICD-10-CM | POA: Diagnosis not present

## 2023-11-16 DIAGNOSIS — K2970 Gastritis, unspecified, without bleeding: Secondary | ICD-10-CM | POA: Diagnosis present

## 2023-11-16 DIAGNOSIS — G35 Multiple sclerosis: Secondary | ICD-10-CM | POA: Diagnosis present

## 2023-11-16 DIAGNOSIS — K219 Gastro-esophageal reflux disease without esophagitis: Secondary | ICD-10-CM | POA: Diagnosis present

## 2023-11-16 DIAGNOSIS — K449 Diaphragmatic hernia without obstruction or gangrene: Secondary | ICD-10-CM | POA: Diagnosis present

## 2023-11-16 DIAGNOSIS — Z90710 Acquired absence of both cervix and uterus: Secondary | ICD-10-CM | POA: Diagnosis not present

## 2023-11-16 DIAGNOSIS — E876 Hypokalemia: Secondary | ICD-10-CM | POA: Diagnosis present

## 2023-11-16 DIAGNOSIS — R531 Weakness: Secondary | ICD-10-CM | POA: Diagnosis present

## 2023-11-16 DIAGNOSIS — Z7982 Long term (current) use of aspirin: Secondary | ICD-10-CM | POA: Diagnosis not present

## 2023-11-16 DIAGNOSIS — Z993 Dependence on wheelchair: Secondary | ICD-10-CM | POA: Diagnosis not present

## 2023-11-16 DIAGNOSIS — Z1152 Encounter for screening for COVID-19: Secondary | ICD-10-CM | POA: Diagnosis not present

## 2023-11-16 LAB — IRON AND TIBC
Iron: 7 ug/dL — ABNORMAL LOW (ref 28–170)
Iron: 267 ug/dL — ABNORMAL HIGH (ref 28–170)
Saturation Ratios: 2 % — ABNORMAL LOW (ref 10.4–31.8)
Saturation Ratios: 62 % — ABNORMAL HIGH (ref 10.4–31.8)
TIBC: 437 ug/dL (ref 250–450)
TIBC: 430 ug/dL (ref 250–450)
UIBC: 430 ug/dL
UIBC: 163 ug/dL

## 2023-11-16 LAB — BASIC METABOLIC PANEL
Anion gap: 9 (ref 5–15)
BUN: 14 mg/dL (ref 8–23)
CO2: 22 mmol/L (ref 22–32)
Calcium: 8.6 mg/dL — ABNORMAL LOW (ref 8.9–10.3)
Chloride: 108 mmol/L (ref 98–111)
Creatinine, Ser: 0.79 mg/dL (ref 0.44–1.00)
GFR, Estimated: >60 mL/min (ref >60)
Glucose, Bld: 90 mg/dL (ref 70–99)
Potassium: 3.6 mmol/L (ref 3.5–5.1)
Sodium: 139 mmol/L (ref 135–145)

## 2023-11-16 LAB — ABO/RH: ABO/RH(D): O POS

## 2023-11-16 LAB — HEMOGLOBIN AND HEMATOCRIT, BLOOD
HCT: 30.1 % — ABNORMAL LOW (ref 36.0–46.0)
HCT: 27.5 % — ABNORMAL LOW (ref 36.0–46.0)
Hemoglobin: 9.4 g/dL — ABNORMAL LOW (ref 12.0–15.0)
Hemoglobin: 8.7 g/dL — ABNORMAL LOW (ref 12.0–15.0)

## 2023-11-16 LAB — URINALYSIS, COMPLETE (UACMP) WITH MICROSCOPIC
Bilirubin Urine: NEGATIVE
Glucose, UA: NEGATIVE mg/dL
Ketones, ur: 5 mg/dL — AB
Nitrite: NEGATIVE
Protein, ur: 100 mg/dL — AB
RBC / HPF: >50 RBC/hpf (ref 0–5)
Specific Gravity, Urine: >1.046 — ABNORMAL HIGH (ref 1.005–1.030)
WBC, UA: >50 WBC/hpf (ref 0–5)
pH: 5 (ref 5.0–8.0)

## 2023-11-16 LAB — CBC
HCT: 24.6 % — ABNORMAL LOW (ref 36.0–46.0)
Hemoglobin: 7.3 g/dL — ABNORMAL LOW (ref 12.0–15.0)
MCH: 21.3 pg — ABNORMAL LOW (ref 26.0–34.0)
MCHC: 29.7 g/dL — ABNORMAL LOW (ref 30.0–36.0)
MCV: 71.7 fL — ABNORMAL LOW (ref 80.0–100.0)
Platelets: 191 10*3/uL (ref 150–400)
RBC: 3.43 MIL/uL — ABNORMAL LOW (ref 3.87–5.11)
RDW: 24.7 % — ABNORMAL HIGH (ref 11.5–15.5)
WBC: 4.4 10*3/uL (ref 4.0–10.5)
nRBC: 0.7 % — ABNORMAL HIGH (ref 0.0–0.2)

## 2023-11-16 LAB — RETICULOCYTES
Immature Retic Fract: 19.7 % — ABNORMAL HIGH (ref 2.3–15.9)
RBC.: 2.72 MIL/uL — ABNORMAL LOW (ref 3.87–5.11)
Retic Count, Absolute: 27.2 10*3/uL (ref 19.0–186.0)
Retic Ct Pct: 1 % (ref 0.4–3.1)

## 2023-11-16 LAB — PREPARE RBC (CROSSMATCH)

## 2023-11-16 LAB — FERRITIN
Ferritin: 10 ng/mL — ABNORMAL LOW (ref 11–307)
Ferritin: 22 ng/mL (ref 11–307)

## 2023-11-16 LAB — VITAMIN B12: Vitamin B-12: 4149 pg/mL — ABNORMAL HIGH (ref 180–914)

## 2023-11-16 LAB — FOLATE: Folate: 21.8 ng/mL (ref >5.9)

## 2023-11-16 MED ORDER — ACETAMINOPHEN 325 MG PO TABS: 650.0000 mg | ORAL_TABLET | Freq: Four times a day (QID) | ORAL | Status: DC | PRN

## 2023-11-16 MED ORDER — SODIUM CHLORIDE 0.9 % IV SOLN
1.0000 g | INTRAVENOUS | Status: DC
  Administered 2023-11-16 – 2023-11-19 (×4): 1 g via INTRAVENOUS
  Filled 2023-11-16 (×4): qty 10

## 2023-11-16 MED ORDER — SODIUM CHLORIDE 0.9 % IV BOLUS
1000.0000 mL | Freq: Once | INTRAVENOUS | Status: AC
  Administered 2023-11-16: 1000 mL via INTRAVENOUS

## 2023-11-16 MED ORDER — ACETAMINOPHEN 500 MG PO TABS
1000.0000 mg | ORAL_TABLET | Freq: Four times a day (QID) | ORAL | Status: DC | PRN
  Administered 2023-11-17 – 2023-11-19 (×4): 1000 mg via ORAL
  Filled 2023-11-16 (×4): qty 2

## 2023-11-16 MED ORDER — SODIUM CHLORIDE 0.9% IV SOLUTION: Freq: Once | INTRAVENOUS | Status: DC

## 2023-11-16 MED ORDER — SODIUM CHLORIDE 0.9 % IV SOLN: INTRAVENOUS | Status: AC

## 2023-11-16 MED ORDER — FERROUS SULFATE 325 (65 FE) MG PO TABS
325.0000 mg | ORAL_TABLET | Freq: Every day | ORAL | Status: DC
  Administered 2023-11-16 – 2023-11-20 (×3): 325 mg via ORAL
  Filled 2023-11-16 (×3): qty 1

## 2023-11-16 MED ORDER — SODIUM CHLORIDE 0.9% IV SOLUTION: Freq: Once | INTRAVENOUS | Status: AC

## 2023-11-16 MED ORDER — ALBUTEROL SULFATE (2.5 MG/3ML) 0.083% IN NEBU
2.5000 mg | INHALATION_SOLUTION | RESPIRATORY_TRACT | Status: DC | PRN
  Administered 2023-11-16: 2.5 mg via RESPIRATORY_TRACT
  Filled 2023-11-16: qty 3

## 2023-11-16 MED ORDER — SODIUM CHLORIDE 0.9 % IV SOLN
500.0000 mg | INTRAVENOUS | Status: DC
  Administered 2023-11-17 – 2023-11-20 (×4): 500 mg via INTRAVENOUS
  Filled 2023-11-16 (×4): qty 5

## 2023-11-16 MED ORDER — ADULT MULTIVITAMIN W/MINERALS CH
1.0000 | ORAL_TABLET | Freq: Every day | ORAL | Status: DC
  Administered 2023-11-16 – 2023-11-20 (×5): 1 via ORAL
  Filled 2023-11-16 (×5): qty 1

## 2023-11-16 MED ORDER — PANTOPRAZOLE SODIUM 40 MG PO TBEC
40.0000 mg | DELAYED_RELEASE_TABLET | Freq: Every day | ORAL | Status: DC
  Administered 2023-11-16 – 2023-11-18 (×3): 40 mg via ORAL
  Filled 2023-11-16 (×3): qty 1

## 2023-11-16 MED ORDER — POLYVINYL ALCOHOL 1.4 % OP SOLN: 2.0000 [drp] | Freq: Two times a day (BID) | OPHTHALMIC | Status: DC | PRN

## 2023-11-16 MED ORDER — ONDANSETRON HCL 4 MG/2ML IJ SOLN
4.0000 mg | Freq: Once | INTRAMUSCULAR | Status: AC
  Administered 2023-11-16: 4 mg via INTRAVENOUS

## 2023-11-16 MED ORDER — ACETAMINOPHEN 650 MG RE SUPP: 650.0000 mg | Freq: Four times a day (QID) | RECTAL | Status: DC | PRN

## 2023-11-16 MED ORDER — ONDANSETRON HCL 4 MG/2ML IJ SOLN
INTRAMUSCULAR | Status: AC
  Filled 2023-11-16: qty 2

## 2023-11-16 NOTE — Consult Note (Addendum)
 Consultation Note   Referring Provider:  [facility] PCP: Laqueta Due., MD Primary Gastroenterologist:   Gentry Fitz Reason for Consultation: Heme positive stool and anemia DOA: 11/15/2023         Hospital Day: 2   Attending physician's note  I have taken a history, reviewed the chart and examined the patient. I performed a substantive portion of this encounter, including complete performance of at least one of the key components, in conjunction with the APP. I agree with the APP's note, impression and recommendations.    78 year old female with multiple comorbidities including multiple sclerosis with chronic iron deficiency anemia Presented with RSV multifocal pneumonia  Patient will benefit from EGD and colonoscopy during this admission, will tentatively plan for Thursday once her respiratory status improves Clear liquid diet We will plan to do bowel prep tomorrow and procedures on Thursday   The patient and husband at bedside was provided an opportunity to ask questions and all were answered. They  agreed with the plan and demonstrated an understanding of the instructions.  Iona Beard , MD [phone]     ASSESSMENT    78 year old female with severe microcytic anemia / FOBT+ but no reported overt GI bleeding.  Rule out AVMs , PUD , Cameron's erosions , neoplasm . Hgb 5.6, down from 13 in 2023.  Hemoglobin has improved to 9.4 post 2 units RBCs  Moderate to large hiatal hernia.  RSV / multifocal pneumonia  Multiple sclerosis, wheelchair-bound  Principal Problem:   Acute anemia Active Problems:   Acute blood loss anemia (ABLA)     PLAN:   -- Getting antibiotics for pneumonia -- This admission she will need EGD and colonoscopy. We briefly discussed this today.  -- Iron studies --Empiric pantoprazole 40 mg once daily  HPI   78 year old female with medical history of multiple sclerosis, clinical blindness,  kidney stones, anemia and GERD.  Patient presented to the ED yesterday for weakness, shortness of breath congestion and coughing.  She tested positive for RSV.  Was also found to be severely anemic with heme positive stool. She can't really see stool too well but son told ED provider that there has been no overt GI bleeding  /melena..  However, she does take iron sometimes. She hasn't been compliant  with daily iron. She take a baby asa sometimes and sometimes takes a full dose aspirin.  She is unsure about the use of ibuprofen or any other NSAIDs.  However according to the H&P there has been intermittent use of ibuprofen or Aleve.  She is not on a PPI nor H2 blocker.  She has not been having any nausea, vomiting, abdominal pain or bowel changes.   Labs:  Hemoglobin 5.6, down from 26 June 2022, MCV 64.8.  Platelets and WBC normal.  BUN and creatinine are normal  Imaging:  CT AP with contrast IMPRESSION: 1. Bilateral lower lobe pneumonia. 2. Bilateral nephrolithiasis. There is a stone in the right renal pelvis with mild adjacent inflammatory stranding. No hydronephrosis. 3. Diffuse bladder wall thickening. Correlate with urinalysis to exclude cystitis. 4. Moderate to large hiatal hernia. 5. Aortic Atherosclerosis (ICD10-I70.0).  Previous GI Evaluations   EGD 2014 -[facility].  Done for hematemesis --Medium size hiatal hernia.  Gastritis.  No active bleeding   Past Medical History:  Diagnosis Date   Anemia    Blindness    Dysrhythmia    GERD (gastroesophageal reflux disease)    Glaucoma    Headache    History of kidney stones    Legally blind 2018   Multiple sclerosis (HCC)    Osteoporosis    Pelvic rash    Pneumonia     Past Surgical History:  Procedure Laterality Date   ABDOMINAL HYSTERECTOMY     CATARACT EXTRACTION Left    CYSTOSCOPY WITH RETROGRADE PYELOGRAM, URETEROSCOPY AND STENT PLACEMENT Left 08/01/2018   Procedure: CYSTOSCOPY WITH RETROGRADE PYELOGRAM,  URETEROSCOPY, HOLMIUM LASER AND STENT PLACEMENT;  Surgeon: Crista Elliot, MD;  Location: [facility];  Service: Urology;  Laterality: Left;   CYSTOSCOPY WITH RETROGRADE PYELOGRAM, URETEROSCOPY AND STENT PLACEMENT Bilateral 06/01/2022   Procedure: CYSTOSCOPY WITH BILATERAL RETROGRADE PYELOGRAM, RIGHT URETEROSCOPY WITH HOLMIUM LASER ,STONE BASKETRY AND STENT  PLACEMENT, LEFT DIAGNOSTIC URETEROSCOPY AND STENT PLACEMENT;  Surgeon: Crista Elliot, MD;  Location: [facility];  Service: Urology;  Laterality: Bilateral;   CYSTOSCOPY/URETEROSCOPY/HOLMIUM LASER/STENT PLACEMENT Bilateral 06/22/2022   Procedure: CYSTOSCOPY RIGHT URETERAL STENT REMOVAL LEFT URETEROSCOPY/HOLMIUM LASER/STENT PLACEMENT;  Surgeon: Crista Elliot, MD;  Location: [facility];  Service: Urology;  Laterality: Bilateral;   ESOPHAGOGASTRODUODENOSCOPY N/A 11/06/2012   Procedure: ESOPHAGOGASTRODUODENOSCOPY (EGD);  Surgeon: Shirley Friar, MD;  Location: [facility];  Service: Endoscopy;  Laterality: N/A;   KIDNEY STONE SURGERY     TRANSURETHRAL RESECTION OF BLADDER TUMOR N/A 06/01/2022   Procedure: BLADDER BIOPSY  ;  Surgeon: Crista Elliot, MD;  Location: [facility];  Service: Urology;  Laterality: N/A;  90 MINS FOR CASE    Family History  Problem Relation Age of Onset   CAD Father    Heart attack Neg Hx     Prior to Admission medications   Medication Sig Start Date End Date Taking? Authorizing Provider  acetaminophen (TYLENOL) 500 MG tablet Take 1,000 mg by mouth every 6 (six) hours as needed for mild pain.   Yes [provider]  aspirin EC 325 MG tablet Take 325 mg by mouth daily as needed for mild pain (pain score 1-3).   Yes [provider]  b complex vitamins capsule Take 1 capsule by mouth daily.   Yes [provider]  baclofen (LIORESAL) 10 MG tablet Take 1 tablet (10 mg total) by mouth 3 (three) times daily as needed for muscle spasms. 09/22/17  Yes Benjiman Core, MD  carboxymethylcellulose  (REFRESH PLUS) 0.5 % SOLN Place 2 drops into both eyes 2 (two) times daily as needed (dry eyes).   Yes [provider]  Cholecalciferol (VITAMIN D3 PO) Take 1 tablet by mouth daily.   Yes [provider]  ELDERBERRY PO Take 1 tablet by mouth daily.   Yes [provider]  ferrous sulfate 325 (65 FE) MG tablet Take 325 mg by mouth daily with breakfast.   Yes [provider]  FIBER ADULT GUMMIES PO Take 1 tablet by mouth daily.   Yes [provider]  gabapentin (NEURONTIN) 300 MG capsule Take 1 capsule (300 mg total) by mouth 3 (three) times daily. Patient taking differently: Take 300 mg by mouth daily as needed (pain). 07/01/21 11/15/24 Yes Sater, Pearletha Furl, MD  Multiple Vitamin (MULTIVITAMIN WITH MINERALS) TABS tablet Take 1 tablet by mouth daily.   Yes [provider]  naproxen sodium (ALEVE) 220 MG tablet Take  220 mg by mouth daily as needed (pain).   Yes [provider]    Current Facility-Administered Medications  Medication Dose Route Frequency Provider Last Rate Last Admin   0.9 %  sodium chloride infusion (Manually program via Guardrails IV Fluids)   Intravenous Once Anders Simmonds T, DO   Stopped at 11/16/23 0951   0.9 %  sodium chloride infusion   Intravenous Continuous Acheampong, Genice Rouge, MD 50 mL/hr at 11/16/23 0951 New Bag at 11/16/23 0951   acetaminophen (TYLENOL) tablet 1,000 mg  1,000 mg Oral Q6H PRN Acheampong, Genice Rouge, MD       albuterol (PROVENTIL) (2.5 MG/3ML) 0.083% nebulizer solution 2.5 mg  2.5 mg Nebulization Q2H PRN Acheampong, Genice Rouge, MD       Melene Muller ON 11/17/2023] azithromycin (ZITHROMAX) 500 mg in sodium chloride 0.9 % 250 mL IVPB  500 mg Intravenous Q24H Acheampong, Genice Rouge, MD       cefTRIAXone (ROCEPHIN) 1 g in sodium chloride 0.9 % 100 mL IVPB  1 g Intravenous Q24H Acheampong, Genice Rouge, MD       ferrous sulfate tablet 325 mg  325 mg Oral Q breakfast Acheampong, Genice Rouge, MD   325 mg at 11/16/23 1191    multivitamin with minerals tablet 1 tablet  1 tablet Oral Daily Acheampong, Genice Rouge, MD   1 tablet at 11/16/23 4782   polyvinyl alcohol (LIQUIFILM TEARS) 1.4 % ophthalmic solution 2 drop  2 drop Both Eyes BID PRN Acheampong, Genice Rouge, MD       Current Outpatient Medications  Medication Sig Dispense Refill   acetaminophen (TYLENOL) 500 MG tablet Take 1,000 mg by mouth every 6 (six) hours as needed for mild pain.     aspirin EC 325 MG tablet Take 325 mg by mouth daily as needed for mild pain (pain score 1-3).     b complex vitamins capsule Take 1 capsule by mouth daily.     baclofen (LIORESAL) 10 MG tablet Take 1 tablet (10 mg total) by mouth 3 (three) times daily as needed for muscle spasms. 10 each 0   carboxymethylcellulose (REFRESH PLUS) 0.5 % SOLN Place 2 drops into both eyes 2 (two) times daily as needed (dry eyes).     Cholecalciferol (VITAMIN D3 PO) Take 1 tablet by mouth daily.     ELDERBERRY PO Take 1 tablet by mouth daily.     ferrous sulfate 325 (65 FE) MG tablet Take 325 mg by mouth daily with breakfast.     FIBER ADULT GUMMIES PO Take 1 tablet by mouth daily.     gabapentin (NEURONTIN) 300 MG capsule Take 1 capsule (300 mg total) by mouth 3 (three) times daily. (Patient taking differently: Take 300 mg by mouth daily as needed (pain).) 90 capsule 5   Multiple Vitamin (MULTIVITAMIN WITH MINERALS) TABS tablet Take 1 tablet by mouth daily.     naproxen sodium (ALEVE) 220 MG tablet Take 220 mg by mouth daily as needed (pain).      Allergies as of 11/15/2023 - Review Complete 11/15/2023  Allergen Reaction Noted   Banana Itching 05/22/2022   Other Itching 07/03/2011   Watermelon [citrullus vulgaris] Itching 05/22/2022   Epinephrine Anxiety and Other (See Comments) 07/03/2011    Social History   Socioeconomic History   Marital status: Married    Spouse name: Not on file   Number of children: Not on file   Years of education: Not on file   Highest education level: Not on file  Occupational History   Not on file  Tobacco Use   Smoking status: Never   Smokeless tobacco: Never  Vaping Use   Vaping status: Never Used  Substance and Sexual Activity   Alcohol use: No    Alcohol/week: 0.0 standard drinks of alcohol   Drug use: No   Sexual activity: Not on file  Other Topics Concern   Not on file  Social History Narrative   Right handed   Caffeine use: pepsi sometimes   Social Drivers of Corporate investment banker Strain: Not on file  Food Insecurity: No Food Insecurity (06/01/2022)   Hunger Vital Sign    Worried About Running Out of Food in the Last Year: Never true    Ran Out of Food in the Last Year: Never true  Transportation Needs: No Transportation Needs (06/01/2022)   PRAPARE - Administrator, Civil Service (Medical): No    Lack of Transportation (Non-Medical): No  Physical Activity: Not on file  Stress: Not on file  Social Connections: Not on file  Intimate Partner Violence: Not At Risk (06/01/2022)   Humiliation, Afraid, Rape, and Kick questionnaire    Fear of Current or Ex-Partner: No    Emotionally Abused: No    Physically Abused: No    Sexually Abused: No     Code Status   Code Status: Full Code  Review of Systems: All systems reviewed and negative except where noted in HPI.  Physical Exam: Vital signs in last 24 hours: Temp:  [97.7 F (36.5 C)-98.9 F (37.2 C)] 98.9 F (37.2 C) (03/04 1152) Pulse Rate:  [77-99] 82 (03/04 1130) Resp:  [12-31] 16 (03/04 1130) BP: (93-143)/(41-79) 133/59 (03/04 1130) SpO2:  [88 %-100 %] 100 % (03/04 1130) Weight:  [65.8 kg] 65.8 kg (03/03 1545)    General:  Pleasant female in NAD Psych:  Cooperative. Normal mood and affect Eyes: Pupils equal Ears:  Normal auditory acuity Nose: No deformity, discharge or lesions Neck:  Supple, no masses felt Lungs:  .  Rhonchi in right lower lobe Heart:  Regular rate, regular rhythm.  Abdomen:  Soft, nondistended, nontender, active bowel  sounds, no masses felt Rectal :  Deferred Msk: Symmetrical without gross deformities.  Neurologic:  Alert, oriented, grossly normal neurologically Extremities : No edema Skin:  Intact without significant lesions.    Intake/Output from previous day: 03/03 0701 - 03/04 0700 In: 707.3 [Blood:378; IV Piggyback:329.3] Out: -  Intake/Output this shift:  No intake/output data recorded.   Willette Cluster, NP-C   11/16/2023, 12:07 PM

## 2023-11-16 NOTE — ED Provider Notes (Signed)
 78 year old female coming as a transfer from med center [facility] for community-acquired pneumonia, RSV, anemia.  Reviewed med Center PA note.  Have ordered a type and screen on the patient.  Will plan to transfuse.  Upon my assessment, patient with stable vital signs.  Messaged hospitalist who will admit the patient.  GI consultation ordered.   Anders Simmonds T, DO 11/16/23 0104

## 2023-11-16 NOTE — Plan of Care (Addendum)
 Plan of Care Note for accepted transfer  Patient: Heather Lutz              BJY:[id number]  DOA: 11/15/2023     Facility requesting transfer: [facility] to [facility] emergency department Requesting Provider:  Renne Crigler, PA and Dr. Andria Meuse   Reason for transfer: Acute development of anemia in the setting of occult GI bleed, acute hypoxic respiratory failure in the setting of RSV infection and community-acquired pneumonia   Facility course: 78 year old female history of MS for last 30 years, osteoporosis history of cataract extraction, history of nephrolithiasis and baseline wheelchair-bound presented to emergency department with complaining of generalized body ache, cough, shortness of breath which has been ongoing for last 5 days.  Patient also has low-grade temperature.  Denies any nausea and vomiting.   At presentation to ED patient O2 sat dropped to 88% room air.  Currently O2 sat 96 to 100% on 2 L oxygen otherwise hemodynamically stable. Respiratory panel positive with RSV. CBC showing low hemoglobin 5.6, low MCV and hematocrit.  Normal WBC and platelet count.  Baseline hemoglobin around 13-14. BMP showing low sodium 133 otherwise unremarkable.   FOBT positive.  Chest x-ray showing multifocal pneumonia.  CT abdomen pelvis showed bilateral lower lobe pneumonia.  Bilateral nephrolithiasis.  There is a stone in the right renal pelvis with mild Ellenson inflammation and stranding.  No hydronephrosis.  Diffuse bladder wall thickening.  Moderate to large hiatal hernia.  Aortic atherosclerosis.  While patient was at [facility] Unable to obtain the type-screen screen and blood transfusion at present at [facility].  Requested ED physician to transfer the patient ED to ED at [facility] in the setting of acute development of anemia low hemoglobin 5.6.  Patient will need blood transfusion immediate in the setting of symptomatic anemia and occult GI bleed. Also requested to  consult GI for evaluation for lower GI bleed.  In the ED patient has received azithromycin and ceftriaxone.  1 L of NS bolus.  Zofran 4 mg.  [facility] has been consulted.  Will evaluate patient in the daytime.  Hospitalist has been consulted for further evaluation management of acute hypoxic respiratory failure in the setting of RSV infection and bilateral lower lobe pneumonia, acute development of anemia due to occult GI bleed.  Plan of care: The patient is accepted for admission for inpatient status to Progressive unit, at [facility]. Patient will need a full admission orders and H&P.   Check [email or web address] for on-call coverage.  [facility] will assume care on arrival to accepting facility. Until arrival, medical decision making responsibilities remain with the EDP.  However, [facility] available 24/7 for questions and assistance.   Nursing staff please page [facility] Admits and Consults [phone]) as soon as the patient arrives to the hospital.    Author: Tereasa Coop, MD  11/16/2023  [facility]

## 2023-11-16 NOTE — Progress Notes (Signed)
 Same day note   Heather Lutz is a 78 y.o. female with medical history significant of multiple sclerosis for the past 30 years, due to cataract, GERD, wheelchair-bound at baseline health by family at home presented to hospital with nasal congestion, cough and shortness of breath with generalized weakness for 1 week.  Also reported left axillary area pain and was taking morphine at home.  In the outside hospital ED patient tested positive for RSV.  In the ED hemoglobin was 5.6, patient tested positive for RSV and Fecal occult blood was positive.  Patient was then referred to the hospital for further evaluation and treatment.    Patient seen and examined at bedside.  Patient was admitted to the hospital for nasal congestion cough and shortness of breath  At the time of my evaluation, patient complains of cough congestion.  Physical examination reveals obese built female, not in obvious distress, on nasal cannula oxygen, decreased breath sounds bilaterally with coarse breath sounds.  Bilateral lower extremity profound weakness.  Able to move upper extremities.  Laboratory data and imaging was reviewed  Assessment and Plan.   Acute symptomatic anemia secondary to acute blood loss anemia: Has macrocytic picture.  Last documented hemoglobin was 13 in 2023.  Status post 1 unit of packed RBC.  Hemoglobin on presentation at 5.6.  Hold off with Aleve.  Patient with hemoglobin of 7.3.  Seen by GI and plan for EGD/colonoscopy evaluation.  Follow GI recommendations.    Microcytic anemia: Check anemia panel.  Plan for EGD colonoscopy   RSV multifocal viral pneumonia.   Empirically on Rocephin and Zithromax, bronchodilators and supportive care.  Wean oxygen as able.   History of multiple sclerosis: Wheelchair chair bound at baseline.  Being cared by family at home.  On baclofen and gabapentin as outpatient.     Clinically blind: Supportive care.    Incidental finding of bilateral  nephrolithiasis: Possible cystitis. Urine with more than 50 WBC and RBC.could be losing blood from from urinary bladder as well.  CT scan without any obstructive uropathy diffuse bladder wall thickening.  Already on Rocephin.  Will continue.  Spoke with the patient's husband at bedside.  No Charge  Signed,  Tenny Craw, MD [facility]

## 2023-11-16 NOTE — ED Notes (Signed)
 Transfer from [facility] for low hemoglobin. H/o MS. Normally wheelchair bound. Chest xray with PNA>.Antibiotics completed during transfer.

## 2023-11-16 NOTE — Plan of Care (Signed)
  Problem: Nutrition: Goal: Adequate nutrition will be maintained Outcome: Progressing   Problem: Coping: Goal: Level of anxiety will decrease Outcome: Progressing   Problem: Elimination: Goal: Will not experience complications related to bowel motility Outcome: Progressing Goal: Will not experience complications related to urinary retention Outcome: Progressing   Problem: Pain Managment: Goal: General experience of comfort will improve and/or be controlled Outcome: Progressing

## 2023-11-16 NOTE — H&P (Signed)
 History and Physical    Patient: Heather Lutz:[id number] DOB: 03/18/1946 DOA: 11/15/2023 DOS: the patient was seen and examined on 11/16/2023 PCP: Laqueta Due., MD  Patient coming from: Home  Chief Complaint:  Chief Complaint  Patient presents with   Weakness   HPI: Heather Lutz is a 78 y.o. 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  Patient presented to the emergency room accompanied by family with complaints of nasal congestion, coughing spells, shortness of breath and generalized weakness.  Symptoms have been ongoing for the past 1 week.  She also had reported pain in her axillary several weeks prior.  She had been taking intermittent ibuprofen or Aleve for symptom relief.  She also take baclofen for her pain symptoms. Husband and son who primary caretakers denied any history of melena like stool.  No history of hematemesis.  No prior history of GI bleeding.  She is not on any blood thinners at home except for aspirin.    She was initially seen in an OSH ED and tested positive for RSV.  Labs came back showing severe anemia with hemoglobin of 5.5.  Fecal occult blood test came back positive.  Patient was referred to the hospital for admission.  She denies any chest pain at this time.  She however was noted to be more lethargic and drowsy.  She was however easily arousable and able to answer questions appropriately. Review of Systems: As mentioned in the history of present illness. All other systems reviewed and are negative. Past Medical History:  Diagnosis Date   Anemia    Blindness    Dysrhythmia    GERD (gastroesophageal reflux disease)    Glaucoma    Headache    History of kidney stones    Legally blind 2018   Multiple sclerosis (HCC)    Osteoporosis    Pelvic rash    Pneumonia     Past Surgical History:  Procedure Laterality Date   ABDOMINAL HYSTERECTOMY     CATARACT EXTRACTION Left    CYSTOSCOPY WITH RETROGRADE PYELOGRAM, URETEROSCOPY AND STENT PLACEMENT Left 08/01/2018   Procedure: CYSTOSCOPY WITH RETROGRADE PYELOGRAM, URETEROSCOPY, HOLMIUM LASER AND STENT PLACEMENT;  Surgeon: Crista Elliot, MD;  Location: [facility];  Service: Urology;  Laterality: Left;   CYSTOSCOPY WITH RETROGRADE PYELOGRAM, URETEROSCOPY AND STENT PLACEMENT Bilateral 06/01/2022   Procedure: CYSTOSCOPY WITH BILATERAL RETROGRADE PYELOGRAM, RIGHT URETEROSCOPY WITH HOLMIUM LASER ,STONE BASKETRY AND STENT  PLACEMENT, LEFT DIAGNOSTIC URETEROSCOPY AND STENT PLACEMENT;  Surgeon: Crista Elliot, MD;  Location: [facility];  Service: Urology;  Laterality: Bilateral;   CYSTOSCOPY/URETEROSCOPY/HOLMIUM LASER/STENT PLACEMENT Bilateral 06/22/2022   Procedure: CYSTOSCOPY RIGHT URETERAL STENT REMOVAL LEFT URETEROSCOPY/HOLMIUM LASER/STENT PLACEMENT;  Surgeon: Crista Elliot, MD;  Location: [facility];  Service: Urology;  Laterality: Bilateral;   ESOPHAGOGASTRODUODENOSCOPY N/A 11/06/2012   Procedure: ESOPHAGOGASTRODUODENOSCOPY (EGD);  Surgeon: Shirley Friar, MD;  Location: [facility];  Service: Endoscopy;  Laterality: N/A;   KIDNEY STONE SURGERY     TRANSURETHRAL RESECTION OF BLADDER TUMOR N/A 06/01/2022   Procedure: BLADDER BIOPSY  ;  Surgeon: Crista Elliot, MD;  Location: [facility];  Service: Urology;  Laterality: N/A;  90 MINS FOR CASE   Social History:  reports that she has never smoked. She has never used smokeless tobacco. She reports that she does not  drink alcohol and does not use drugs.  Allergies  Allergen Reactions   Banana Itching   Other Itching    Walnuts, honeydew, canteloupe,   Watermelon [Citrullus Vulgaris] Itching   Epinephrine Anxiety and Other (See Comments)    Hallucinations     Family History  Problem Relation Age of Onset   CAD Father    Heart attack Neg Hx     Prior to  Admission medications   Medication Sig Start Date End Date Taking? Authorizing Provider  acetaminophen (TYLENOL) 500 MG tablet Take 1,000 mg by mouth every 6 (six) hours as needed for mild pain.   Yes [provider]  aspirin EC 325 MG tablet Take 325 mg by mouth daily as needed for mild pain (pain score 1-3).   Yes [provider]  b complex vitamins capsule Take 1 capsule by mouth daily.   Yes [provider]  baclofen (LIORESAL) 10 MG tablet Take 1 tablet (10 mg total) by mouth 3 (three) times daily as needed for muscle spasms. 09/22/17  Yes Benjiman Core, MD  carboxymethylcellulose (REFRESH PLUS) 0.5 % SOLN Place 2 drops into both eyes 2 (two) times daily as needed (dry eyes).   Yes [provider]  Cholecalciferol (VITAMIN D3 PO) Take 1 tablet by mouth daily.   Yes [provider]  ELDERBERRY PO Take 1 tablet by mouth daily.   Yes [provider]  ferrous sulfate 325 (65 FE) MG tablet Take 325 mg by mouth daily with breakfast.   Yes [provider]  FIBER ADULT GUMMIES PO Take 1 tablet by mouth daily.   Yes [provider]  gabapentin (NEURONTIN) 300 MG capsule Take 1 capsule (300 mg total) by mouth 3 (three) times daily. Patient taking differently: Take 300 mg by mouth daily as needed (pain). 07/01/21 11/15/24 Yes Sater, Pearletha Furl, MD  Multiple Vitamin (MULTIVITAMIN WITH MINERALS) TABS tablet Take 1 tablet by mouth daily.   Yes [provider]  naproxen sodium (ALEVE) 220 MG tablet Take 220 mg by mouth daily as needed (pain).   Yes [provider]    Physical Exam: Vitals:   11/15/23 2032 11/15/23 2100 11/15/23 2327 11/16/23 0141  BP: (!) 113/44 (!) 124/49  (!) 93/41  Pulse: 99 95    Resp: 18   13  Temp:   98.2 F (36.8 C)   TempSrc:   Oral   SpO2: 96% 97%    Weight:      Height:       Patient is a frail elderly female.  She was seen laying comfortably in bed.  Family at bedside.  Not in any  acute distress.  She has supplemental oxygen in place. HEENT: Patient has nasal mask in place. Neck: Supple with no palpable organomegaly. Chest clinically diminished, poor respiratory effort.  No added sounds appreciated. Abdomen: Soft nontender Extremities without pedal edema CNS: Unable to assess clinically at this time due to patient's condition.  Data Reviewed: Sodium is 133, potassium 3.6, chloride 101, bicarb 21, glucose 101, BUN 20, creatinine 0.94 calcium 8.5.  WBC 4.3, hemoglobin 5.1, hematocrit 17.7, MCV 64, platelet count 237, neutrophil 71 reticulocyte count 27.2.  CT chest abdomen and pelvis shows bilateral lower lobe pneumonia.  Bilateral nephrolithiasis also noted.  Is a stone in the right renal pelvis.  Bladder wall thickening also noted.  Moderate to large hiatal hernia  Urinalysis has been ordered: Results pending   Assessment and Plan:  78 year old female admitted on  account of symptomatic anemia.  Hemoglobin of 5.1.  Incidentally found to have RSV pneumonia.  #1.  Acute symptomatic anemia secondary to acute blood loss anemia: Most likely source include GI.  Considering evidence of microcytosis, likely may be chronic.  Patient will be transfused with PRBC.  Last documented hemoglobin was 13 in 2023  #2 Microcytic anemia: Suggesting iron deficiency anemia.  Anemia workup has been ordered and pending patient may benefit from EGD and colonoscopy to evaluate for source of bleeding.  #3.  RSV multifocal viral pneumonia.  Possible underlying bacterial pneumonia cannot be ruled out.  Will empirically cover with IV antibiotics with Rocephin and azithromycin.  Guaifenesin for cough symptoms.  Oxygen supplementation.  As needed bronchodilators for shortness of breath.  #4.  History of multiple sclerosis:  #5.  Clinically blind: Precautions in place.  #6.  DVT prophylaxis.  Avoiding anticoagulation at this time due to GI blood loss.  SCDs for now.  #7: Incidental finding of  bilateral nephrolithiasis: Patient is asymptomatic at this time.  Urinalysis pending.  Finding of bladder wall thickening suggesting possible cystitis.  CT scan did not show any obstructive uropathy.   Advance Care Planning:   Code Status: Full Code   Consults: Gastroenterology  Family Communication:   Severity of Illness: The appropriate patient status for this patient is INPATIENT. Inpatient status is judged to be reasonable and necessary in order to provide the required intensity of service to ensure the patient's safety. The patient's presenting symptoms, physical exam findings, and initial radiographic and laboratory data in the context of their chronic comorbidities is felt to place them at high risk for further clinical deterioration. Furthermore, it is not anticipated that the patient will be medically stable for discharge from the hospital within 2 midnights of admission.   * I certify that at the point of admission it is my clinical judgment that the patient will require inpatient hospital care spanning beyond 2 midnights from the point of admission due to high intensity of service, high risk for further deterioration and high frequency of surveillance required.*  Author: Lilia Pro, MD 11/16/2023 3:12 AM  For on call review [email or web address].

## 2023-11-17 DIAGNOSIS — R195 Other fecal abnormalities: Secondary | ICD-10-CM

## 2023-11-17 DIAGNOSIS — D649 Anemia, unspecified: Secondary | ICD-10-CM | POA: Diagnosis not present

## 2023-11-17 DIAGNOSIS — B338 Other specified viral diseases: Principal | ICD-10-CM

## 2023-11-17 DIAGNOSIS — D509 Iron deficiency anemia, unspecified: Secondary | ICD-10-CM | POA: Diagnosis not present

## 2023-11-17 DIAGNOSIS — K449 Diaphragmatic hernia without obstruction or gangrene: Secondary | ICD-10-CM | POA: Diagnosis not present

## 2023-11-17 LAB — MAGNESIUM: Magnesium: 1.8 mg/dL (ref 1.7–2.4)

## 2023-11-17 LAB — CBC
HCT: 27.6 % — ABNORMAL LOW (ref 36.0–46.0)
Hemoglobin: 8.7 g/dL — ABNORMAL LOW (ref 12.0–15.0)
MCH: 22.8 pg — ABNORMAL LOW (ref 26.0–34.0)
MCHC: 31.5 g/dL (ref 30.0–36.0)
MCV: 72.4 fL — ABNORMAL LOW (ref 80.0–100.0)
Platelets: 168 10*3/uL (ref 150–400)
RBC: 3.81 MIL/uL — ABNORMAL LOW (ref 3.87–5.11)
RDW: 23.7 % — ABNORMAL HIGH (ref 11.5–15.5)
WBC: 3.9 10*3/uL — ABNORMAL LOW (ref 4.0–10.5)
nRBC: 0.5 % — ABNORMAL HIGH (ref 0.0–0.2)

## 2023-11-17 LAB — BASIC METABOLIC PANEL
Anion gap: 10 (ref 5–15)
BUN: 14 mg/dL (ref 8–23)
CO2: 20 mmol/L — ABNORMAL LOW (ref 22–32)
Calcium: 8.4 mg/dL — ABNORMAL LOW (ref 8.9–10.3)
Chloride: 107 mmol/L (ref 98–111)
Creatinine, Ser: 0.72 mg/dL (ref 0.44–1.00)
GFR, Estimated: >60 mL/min (ref >60)
Glucose, Bld: 83 mg/dL (ref 70–99)
Potassium: 3.6 mmol/L (ref 3.5–5.1)
Sodium: 137 mmol/L (ref 135–145)

## 2023-11-17 MED ORDER — BACLOFEN 10 MG PO TABS
10.0000 mg | ORAL_TABLET | Freq: Three times a day (TID) | ORAL | Status: DC | PRN
  Administered 2023-11-17 – 2023-11-19 (×4): 10 mg via ORAL
  Filled 2023-11-17 (×4): qty 1

## 2023-11-17 MED ORDER — GABAPENTIN 300 MG PO CAPS
300.0000 mg | ORAL_CAPSULE | Freq: Three times a day (TID) | ORAL | Status: DC | PRN
  Administered 2023-11-17 – 2023-11-18 (×2): 300 mg via ORAL
  Filled 2023-11-17 (×2): qty 1

## 2023-11-17 MED ORDER — POLYETHYLENE GLYCOL 3350 17 G PO PACK
255.0000 g | PACK | Freq: Once | ORAL | Status: AC
  Administered 2023-11-17: 255 g via ORAL

## 2023-11-17 MED ORDER — POLYETHYLENE GLYCOL 3350 17 G PO PACK
255.0000 g | PACK | Freq: Once | ORAL | Status: AC
  Administered 2023-11-17: 255 g via ORAL
  Filled 2023-11-17: qty 15

## 2023-11-17 NOTE — Progress Notes (Signed)
 PROGRESS NOTE  Heather Lutz:[id number] DOB: [birth year] DOA: 11/15/2023 PCP: Laqueta Due., MD   LOS: 1 day   Brief narrative:   Heather Lutz is a 78 y.o. female with medical history significant of multiple sclerosis for the past 30 years, due to cataract, GERD, wheelchair-bound at baseline health by family at home presented to hospital with nasal congestion, cough and shortness of breath with generalized weakness for 1 week.  Also reported left axillary area pain and was taking morphine at home.  In the outside hospital ED patient tested positive for RSV.  In the ED hemoglobin was 5.6, patient tested positive for RSV and Fecal occult blood was positive.  Patient was then referred to the hospital for further evaluation and treatment.     Assessment/Plan: Principal Problem:   Acute anemia Active Problems:   Acute blood loss anemia (ABLA)   Acute symptomatic anemia secondary to acute blood loss anemia: Has macrocytic picture.  Last documented hemoglobin was 13 in 2023.  Hemoglobin on presentation at 5.6.  Status post 1 unit of packed RBC.  Hemoglobin today at 8.7.  Hold off with Aleve.   Seen by GI and plan for EGD/colonoscopy evaluation today..  Follow GI recommendations.    Microcytic anemia: Iron level elevated but ferritin low at 22.  Plan for EGD/ colonoscopy   RSV multifocal viral pneumonia.   Empirically on Rocephin and Zithromax, bronchodilators and supportive care.   Still on supplemental oxygen.  Will continue to wean as able.    History of multiple sclerosis: Wheelchair chair bound at baseline.  Being cared by family at home.  On baclofen and gabapentin as outpatient.     Clinically blind: Supportive care.    Incidental finding of bilateral nephrolithiasis: Possible cystitis. Urine with more than 50 WBC and RBC.could be losing blood from from urinary bladder as well.  CT scan without any obstructive uropathy diffuse bladder wall thickening.  Already on Rocephin.   Will continue.  DVT prophylaxis: SCDs Start: 11/16/23 0232   Disposition: Home  Status is: Inpatient Remains inpatient appropriate because: Symptomatic anemia for EGD colonoscopy, multifocal viral pneumonia.    Code Status:     Code Status: Full Code  Family Communication: Spoke with the patient's husband 11/16/2023  Consultants: GI  Procedures: PRBC transfusion 1 unit  Anti-infectives:  Azithromycin and Rocephin  Anti-infectives (From admission, onward)    Start     Dose/Rate Route Frequency Ordered Stop   11/17/23 0000  azithromycin (ZITHROMAX) 500 mg in sodium chloride 0.9 % 250 mL IVPB        500 mg 250 mL/hr over 60 Minutes Intravenous Every 24 hours 11/16/23 0331     11/16/23 2200  cefTRIAXone (ROCEPHIN) 1 g in sodium chloride 0.9 % 100 mL IVPB        1 g 200 mL/hr over 30 Minutes Intravenous Every 24 hours 11/16/23 0331     11/15/23 2315  cefTRIAXone (ROCEPHIN) 1 g in sodium chloride 0.9 % 100 mL IVPB        1 g 200 mL/hr over 30 Minutes Intravenous  Once 11/15/23 2304 11/15/23 2352   11/15/23 2315  azithromycin (ZITHROMAX) 500 mg in sodium chloride 0.9 % 250 mL IVPB        500 mg 250 mL/hr over 60 Minutes Intravenous  Once 11/15/23 2304 11/16/23 0054        Subjective: Today, patient was seen and examined at bedside.  Complains of feeling okay.  Has been  having bowel perforation.  No nausea vomiting fever chills or overt dyspnea.  Has some cough   Objective: Vitals:   11/17/23 0553 11/17/23 0735  BP: (!) 110/53 92/78  Pulse: 82 70  Resp: 18 15  Temp: 98.6 F (37 C) 98.3 F (36.8 C)  SpO2: 96% 97%    Intake/Output Summary (Last 24 hours) at 11/17/2023 0849 Last data filed at 11/17/2023 0603 Gross per 24 hour  Intake 345.83 ml  Output 1 ml  Net 344.83 ml   Filed Weights   11/15/23 1545 11/16/23 1534  Weight: 65.8 kg 63.8 kg   Body mass index is 24.92 kg/m.   Physical Exam: GENERAL: Patient is alert awake and oriented. Not in obvious  distress.  On nasal cannula oxygen. HENT: Mild pallor noted.  Oral mucosa is moist NECK: is supple, no gross swelling noted. CHEST:   Diminished breath sounds bilaterally with coarse breath sounds. CVS: S1 and S2 heard, no murmur. Regular rate and rhythm.  ABDOMEN: Soft, non-tender, bowel sounds are present. EXTREMITIES: No edema. CNS: Cranial nerves are intact.  Bilateral lower extremity weakness noted.  Legally blind. SKIN: warm and dry without rashes.  Data Review: I have personally reviewed the following laboratory data and studies,  CBC: Recent Labs  Lab 11/15/23 1959 11/15/23 2207 11/16/23 0630 11/16/23 0859 11/16/23 1824 11/17/23 0238  WBC 4.3  --  4.4  --   --  3.9*  NEUTROABS 3.1  --   --   --   --   --   HGB 5.6* 5.1* 7.3* 9.4* 8.7* 8.7*  HCT 19.5* 17.7* 24.6* 30.1* 27.5* 27.6*  MCV 64.8*  --  71.7*  --   --  72.4*  PLT 237  --  191  --   --  168   Basic Metabolic Panel: Recent Labs  Lab 11/15/23 1959 11/16/23 0630 11/17/23 0238  NA 133* 139 137  K 3.6 3.6 3.6  CL 101 108 107  CO2 21* 22 20*  GLUCOSE 101* 90 83  BUN 20 14 14   CREATININE 0.94 0.79 0.72  CALCIUM 8.5* 8.6* 8.4*  MG  --   --  1.8   Liver Function Tests: No results for input(s): "AST", "ALT", "ALKPHOS", "BILITOT", "PROT", "ALBUMIN" in the last 168 hours. No results for input(s): "LIPASE", "AMYLASE" in the last 168 hours. No results for input(s): "AMMONIA" in the last 168 hours. Cardiac Enzymes: No results for input(s): "CKTOTAL", "CKMB", "CKMBINDEX", "TROPONINI" in the last 168 hours. BNP (last 3 results) No results for input(s): "BNP" in the last 8760 hours.  ProBNP (last 3 results) No results for input(s): "PROBNP" in the last 8760 hours.  CBG: No results for input(s): "GLUCAP" in the last 168 hours. Recent Results (from the past 240 hours)  Resp panel by RT-PCR (RSV, Flu A&B, Covid) Anterior Nasal Swab     Status: Abnormal   Collection Time: 11/15/23  3:47 PM   Specimen: Anterior  Nasal Swab  Result Value Ref Range Status   SARS Coronavirus 2 by RT PCR NEGATIVE NEGATIVE Final    Comment: (NOTE) SARS-CoV-2 target nucleic acids are NOT DETECTED.  The SARS-CoV-2 RNA is generally detectable in upper respiratory specimens during the acute phase of infection. The lowest concentration of SARS-CoV-2 viral copies this assay can detect is 138 copies/mL. A negative result does not preclude SARS-Cov-2 infection and should not be used as the sole basis for treatment or other patient management decisions. A negative result may occur with  improper  specimen collection/handling, submission of specimen other than nasopharyngeal swab, presence of viral mutation(s) within the areas targeted by this assay, and inadequate number of viral copies(<138 copies/mL). A negative result must be combined with clinical observations, patient history, and epidemiological information. The expected result is Negative.  Fact Sheet for Patients:  BloggerCourse.com  Fact Sheet for Healthcare Providers:  SeriousBroker.it  This test is no t yet approved or cleared by the [place] FDA and  has been authorized for detection and/or diagnosis of SARS-CoV-2 by FDA under an Emergency Use Authorization (EUA). This EUA will remain  in effect (meaning this test can be used) for the duration of the COVID-19 declaration under Section 564(b)(1) of the Act, 21 U.S.C.section 360bbb-3(b)(1), unless the authorization is terminated  or revoked sooner.       Influenza A by PCR NEGATIVE NEGATIVE Final   Influenza B by PCR NEGATIVE NEGATIVE Final    Comment: (NOTE) The Xpert Xpress SARS-CoV-2/FLU/RSV plus assay is intended as an aid in the diagnosis of influenza from Nasopharyngeal swab specimens and should not be used as a sole basis for treatment. Nasal washings and aspirates are unacceptable for Xpert Xpress SARS-CoV-2/FLU/RSV testing.  Fact Sheet for  Patients: BloggerCourse.com  Fact Sheet for Healthcare Providers: SeriousBroker.it  This test is not yet approved or cleared by the [place] FDA and has been authorized for detection and/or diagnosis of SARS-CoV-2 by FDA under an Emergency Use Authorization (EUA). This EUA will remain in effect (meaning this test can be used) for the duration of the COVID-19 declaration under Section 564(b)(1) of the Act, 21 U.S.C. section 360bbb-3(b)(1), unless the authorization is terminated or revoked.     Resp Syncytial Virus by PCR POSITIVE (A) NEGATIVE Final    Comment: (NOTE) Fact Sheet for Patients: BloggerCourse.com  Fact Sheet for Healthcare Providers: SeriousBroker.it  This test is not yet approved or cleared by the [place] FDA and has been authorized for detection and/or diagnosis of SARS-CoV-2 by FDA under an Emergency Use Authorization (EUA). This EUA will remain in effect (meaning this test can be used) for the duration of the COVID-19 declaration under Section 564(b)(1) of the Act, 21 U.S.C. section 360bbb-3(b)(1), unless the authorization is terminated or revoked.  Performed at [facility], [address] [place], [place] [address]      Studies: CT ABDOMEN PELVIS W CONTRAST Result Date: 11/16/2023 CLINICAL DATA:  Lower GI bleed.  Congested, weakness, body aches EXAM: CT ABDOMEN AND PELVIS WITH CONTRAST TECHNIQUE: Multidetector CT imaging of the abdomen and pelvis was performed using the standard protocol following bolus administration of intravenous contrast. RADIATION DOSE REDUCTION: This exam was performed according to the departmental dose-optimization program which includes automated exposure control, adjustment of the mA and/or kV according to patient size and/or use of iterative reconstruction technique. CONTRAST:  OMNIPAQUE IOHEXOL 300 MG/ML  SOLN  COMPARISON:  CT 04/28/2022 FINDINGS: Lower chest: Bilateral lower lobe pneumonia. Hepatobiliary: No acute abnormality. Pancreas: Unremarkable. Spleen: Unremarkable. Adrenals/Urinary Tract: Stable adrenal glands. Bilateral nephrolithiasis. There is a stone in the in the right renal pelvis with mild adjacent inflammatory stranding. No hydronephrosis. No obstructing stones. Diffuse bladder wall thickening. Stomach/Bowel: No bowel obstruction or bowel wall thickening. Normal appendix. Moderate to large hiatal hernia. Vascular/Lymphatic: Aortic atherosclerosis. No enlarged abdominal or pelvic lymph nodes. Reproductive: Hysterectomy. Other: No free intraperitoneal fluid or air. Musculoskeletal: No acute fracture. IMPRESSION: 1. Bilateral lower lobe pneumonia. 2. Bilateral nephrolithiasis. There is a stone in the right renal  pelvis with mild adjacent inflammatory stranding. No hydronephrosis. 3. Diffuse bladder wall thickening. Correlate with urinalysis to exclude cystitis. 4. Moderate to large hiatal hernia. 5. Aortic Atherosclerosis (ICD10-I70.0). Electronically Signed   By: Minerva Fester M.D.   On: 11/16/2023 00:46   DG Chest 2 View Result Date: 11/15/2023 CLINICAL DATA:  RSV, shortness of breath EXAM: CHEST - 2 VIEW COMPARISON:  06/11/2021 FINDINGS: Stable cardiomediastinal silhouette. Bilateral lower lobe airspace opacities. No pleural effusion or pneumothorax. No displaced rib fractures. IMPRESSION: Multifocal pneumonia. Follow-up after treatment is recommended to ensure resolution. Electronically Signed   By: Minerva Fester M.D.   On: 11/15/2023 22:32      Joycelyn Das, MD  [facility] 11/17/2023  If 7PM-7AM, please contact night-coverage

## 2023-11-17 NOTE — Progress Notes (Addendum)
 Progress Note   LOS: 1 day   Chief Complaint: Heme positive stool and anemia   Subjective   Patient states she has not had a bowel movement today.  She states her breathing is significantly improved.  She is currently on 2 LPM via nasal cannula with no active distress.  Denies nausea, vomiting, pain.    Objective   Vital signs in last 24 hours: Temp:  [97.9 F (36.6 C)-99 F (37.2 C)] 98.3 F (36.8 C) (03/05 0735) Pulse Rate:  [70-98] 70 (03/05 0735) Resp:  [12-20] 15 (03/05 0735) BP: (92-143)/(47-87) 92/78 (03/05 0735) SpO2:  [93 %-100 %] 97 % (03/05 0735) Weight:  [63.8 kg] 63.8 kg (03/04 1534) Last BM Date : 11/15/23 Last BM recorded by nurses in past 5 days No data recorded  General:   female in no acute distress Heart:  Regular rate and rhythm; no murmurs Pulm: Clear anteriorly; no wheezing Abdomen: soft, nondistended, normal bowel sounds in all quadrants. Nontender without guarding. No organomegaly appreciated. Extremities:  No edema Neurologic:  Alert and  oriented x4;  No focal deficits.  Psych:  Cooperative. Normal mood and affect.  Intake/Output from previous day: 03/04 0701 - 03/05 0700 In: 345.8 [I.V.:345.8] Out: 1 [Urine:1] Intake/Output this shift: No intake/output data recorded.  Studies/Results: CT ABDOMEN PELVIS W CONTRAST Result Date: 11/16/2023 CLINICAL DATA:  Lower GI bleed.  Congested, weakness, body aches EXAM: CT ABDOMEN AND PELVIS WITH CONTRAST TECHNIQUE: Multidetector CT imaging of the abdomen and pelvis was performed using the standard protocol following bolus administration of intravenous contrast. RADIATION DOSE REDUCTION: This exam was performed according to the departmental dose-optimization program which includes automated exposure control, adjustment of the mA and/or kV according to patient size and/or use of iterative reconstruction technique. CONTRAST:  OMNIPAQUE IOHEXOL 300 MG/ML  SOLN COMPARISON:  CT 04/28/2022 FINDINGS: Lower  chest: Bilateral lower lobe pneumonia. Hepatobiliary: No acute abnormality. Pancreas: Unremarkable. Spleen: Unremarkable. Adrenals/Urinary Tract: Stable adrenal glands. Bilateral nephrolithiasis. There is a stone in the in the right renal pelvis with mild adjacent inflammatory stranding. No hydronephrosis. No obstructing stones. Diffuse bladder wall thickening. Stomach/Bowel: No bowel obstruction or bowel wall thickening. Normal appendix. Moderate to large hiatal hernia. Vascular/Lymphatic: Aortic atherosclerosis. No enlarged abdominal or pelvic lymph nodes. Reproductive: Hysterectomy. Other: No free intraperitoneal fluid or air. Musculoskeletal: No acute fracture. IMPRESSION: 1. Bilateral lower lobe pneumonia. 2. Bilateral nephrolithiasis. There is a stone in the right renal pelvis with mild adjacent inflammatory stranding. No hydronephrosis. 3. Diffuse bladder wall thickening. Correlate with urinalysis to exclude cystitis. 4. Moderate to large hiatal hernia. 5. Aortic Atherosclerosis (ICD10-I70.0). Electronically Signed   By: Minerva Fester M.D.   On: 11/16/2023 00:46   DG Chest 2 View Result Date: 11/15/2023 CLINICAL DATA:  RSV, shortness of breath EXAM: CHEST - 2 VIEW COMPARISON:  06/11/2021 FINDINGS: Stable cardiomediastinal silhouette. Bilateral lower lobe airspace opacities. No pleural effusion or pneumothorax. No displaced rib fractures. IMPRESSION: Multifocal pneumonia. Follow-up after treatment is recommended to ensure resolution. Electronically Signed   By: Minerva Fester M.D.   On: 11/15/2023 22:32    Lab Results: Recent Labs    11/15/23 1959 11/15/23 2207 11/16/23 0630 11/16/23 0859 11/16/23 1824 11/17/23 0238  WBC 4.3  --  4.4  --   --  3.9*  HGB 5.6*   < > 7.3* 9.4* 8.7* 8.7*  HCT 19.5*   < > 24.6* 30.1* 27.5* 27.6*  PLT 237  --  191  --   --  168   < > = values in this interval not displayed.   BMET Recent Labs    11/15/23 1959 11/16/23 0630 11/17/23 0238  NA 133* 139 137   K 3.6 3.6 3.6  CL 101 108 107  CO2 21* 22 20*  GLUCOSE 101* 90 83  BUN 20 14 14   CREATININE 0.94 0.79 0.72  CALCIUM 8.5* 8.6* 8.4*   LFT No results for input(s): "PROT", "ALBUMIN", "AST", "ALT", "ALKPHOS", "BILITOT", "BILIDIR", "IBILI" in the last 72 hours. PT/INR No results for input(s): "LABPROT", "INR" in the last 72 hours.   Scheduled Meds:  sodium chloride   Intravenous Once   ferrous sulfate  325 mg Oral Q breakfast   multivitamin with minerals  1 tablet Oral Daily   pantoprazole  40 mg Oral Daily   Continuous Infusions:  azithromycin 500 mg (11/17/23 0036)   cefTRIAXone (ROCEPHIN)  IV 1 g (11/16/23 2221)      Patient profile:   78 year old female with severe microcytic anemia and FOBT positive stool but no overt GI bleeding initially presenting with shortness of breath, congestion, coughing and found to have RSV multifocal pneumonia   Impression:   Microcytic anemia and heme positive stool and moderate to large hiatal hernia Hgb 8.7, stable BUN 14, CR 0.72 Iron 267, saturation 62%, ferritin 22 No overt bleeding.  No bowel movement today.  Improving from a respiratory standpoint  RSV/multifocal pneumonia  Multiple sclerosis, wheelchair-bound   Plan:   - Tentatively plan for EGD/colonoscopy Friday 3/7.  2-day prep - MiraLAX prep today, Suprep tomorrow - Continue clear liquids - Continue daily CBC and transfuse as needed to maintain HGB > 7 - Continue supportive care   Bayley Leanna Sato  11/17/2023, 9:25 AM   Attending physician's note   I have taken a history, reviewed the chart and examined the patient. I performed a substantive portion of this encounter, including complete performance of at least one of the key components, in conjunction with the APP. I agree with the APP's note, impression and recommendations.   78 year old female with chronic iron deficiency anemia, FOBT positive RSV pneumonia improving  Will plan to start bowel prep with MiraLAX,  patient will likely need 2-day bowel prep Tentatively plan for procedure on Friday Clear liquid diet Monitor hemoglobin and transfuse as needed  The patient was provided an opportunity to ask questions and all were answered. The patient agreed with the plan and demonstrated an understanding of the instructions.   Iona Beard , MD [phone]

## 2023-11-17 NOTE — H&P (View-Only) (Signed)
 Progress Note   LOS: 1 day   Chief Complaint: Heme positive stool and anemia   Subjective   Patient states she has not had a bowel movement today.  She states her breathing is significantly improved.  She is currently on 2 LPM via nasal cannula with no active distress.  Denies nausea, vomiting, pain.    Objective   Vital signs in last 24 hours: Temp:  [97.9 F (36.6 C)-99 F (37.2 C)] 98.3 F (36.8 C) (03/05 0735) Pulse Rate:  [70-98] 70 (03/05 0735) Resp:  [12-20] 15 (03/05 0735) BP: (92-143)/(47-87) 92/78 (03/05 0735) SpO2:  [93 %-100 %] 97 % (03/05 0735) Weight:  [63.8 kg] 63.8 kg (03/04 1534) Last BM Date : 11/15/23 Last BM recorded by nurses in past 5 days No data recorded  General:   female in no acute distress Heart:  Regular rate and rhythm; no murmurs Pulm: Clear anteriorly; no wheezing Abdomen: soft, nondistended, normal bowel sounds in all quadrants. Nontender without guarding. No organomegaly appreciated. Extremities:  No edema Neurologic:  Alert and  oriented x4;  No focal deficits.  Psych:  Cooperative. Normal mood and affect.  Intake/Output from previous day: 03/04 0701 - 03/05 0700 In: 345.8 [I.V.:345.8] Out: 1 [Urine:1] Intake/Output this shift: No intake/output data recorded.  Studies/Results: CT ABDOMEN PELVIS W CONTRAST Result Date: 11/16/2023 CLINICAL DATA:  Lower GI bleed.  Congested, weakness, body aches EXAM: CT ABDOMEN AND PELVIS WITH CONTRAST TECHNIQUE: Multidetector CT imaging of the abdomen and pelvis was performed using the standard protocol following bolus administration of intravenous contrast. RADIATION DOSE REDUCTION: This exam was performed according to the departmental dose-optimization program which includes automated exposure control, adjustment of the mA and/or kV according to patient size and/or use of iterative reconstruction technique. CONTRAST:  OMNIPAQUE IOHEXOL 300 MG/ML  SOLN COMPARISON:  CT 04/28/2022 FINDINGS: Lower  chest: Bilateral lower lobe pneumonia. Hepatobiliary: No acute abnormality. Pancreas: Unremarkable. Spleen: Unremarkable. Adrenals/Urinary Tract: Stable adrenal glands. Bilateral nephrolithiasis. There is a stone in the in the right renal pelvis with mild adjacent inflammatory stranding. No hydronephrosis. No obstructing stones. Diffuse bladder wall thickening. Stomach/Bowel: No bowel obstruction or bowel wall thickening. Normal appendix. Moderate to large hiatal hernia. Vascular/Lymphatic: Aortic atherosclerosis. No enlarged abdominal or pelvic lymph nodes. Reproductive: Hysterectomy. Other: No free intraperitoneal fluid or air. Musculoskeletal: No acute fracture. IMPRESSION: 1. Bilateral lower lobe pneumonia. 2. Bilateral nephrolithiasis. There is a stone in the right renal pelvis with mild adjacent inflammatory stranding. No hydronephrosis. 3. Diffuse bladder wall thickening. Correlate with urinalysis to exclude cystitis. 4. Moderate to large hiatal hernia. 5. Aortic Atherosclerosis (ICD10-I70.0). Electronically Signed   By: Minerva Fester M.D.   On: 11/16/2023 00:46   DG Chest 2 View Result Date: 11/15/2023 CLINICAL DATA:  RSV, shortness of breath EXAM: CHEST - 2 VIEW COMPARISON:  06/11/2021 FINDINGS: Stable cardiomediastinal silhouette. Bilateral lower lobe airspace opacities. No pleural effusion or pneumothorax. No displaced rib fractures. IMPRESSION: Multifocal pneumonia. Follow-up after treatment is recommended to ensure resolution. Electronically Signed   By: Minerva Fester M.D.   On: 11/15/2023 22:32    Lab Results: Recent Labs    11/15/23 1959 11/15/23 2207 11/16/23 0630 11/16/23 0859 11/16/23 1824 11/17/23 0238  WBC 4.3  --  4.4  --   --  3.9*  HGB 5.6*   < > 7.3* 9.4* 8.7* 8.7*  HCT 19.5*   < > 24.6* 30.1* 27.5* 27.6*  PLT 237  --  191  --   --  168   < > = values in this interval not displayed.   BMET Recent Labs    11/15/23 1959 11/16/23 0630 11/17/23 0238  NA 133* 139 137   K 3.6 3.6 3.6  CL 101 108 107  CO2 21* 22 20*  GLUCOSE 101* 90 83  BUN 20 14 14   CREATININE 0.94 0.79 0.72  CALCIUM 8.5* 8.6* 8.4*   LFT No results for input(s): "PROT", "ALBUMIN", "AST", "ALT", "ALKPHOS", "BILITOT", "BILIDIR", "IBILI" in the last 72 hours. PT/INR No results for input(s): "LABPROT", "INR" in the last 72 hours.   Scheduled Meds:  sodium chloride   Intravenous Once   ferrous sulfate  325 mg Oral Q breakfast   multivitamin with minerals  1 tablet Oral Daily   pantoprazole  40 mg Oral Daily   Continuous Infusions:  azithromycin 500 mg (11/17/23 0036)   cefTRIAXone (ROCEPHIN)  IV 1 g (11/16/23 2221)      Patient profile:   78 year old female with severe microcytic anemia and FOBT positive stool but no overt GI bleeding initially presenting with shortness of breath, congestion, coughing and found to have RSV multifocal pneumonia   Impression:   Microcytic anemia and heme positive stool and moderate to large hiatal hernia Hgb 8.7, stable BUN 14, CR 0.72 Iron 267, saturation 62%, ferritin 22 No overt bleeding.  No bowel movement today.  Improving from a respiratory standpoint  RSV/multifocal pneumonia  Multiple sclerosis, wheelchair-bound   Plan:   - Tentatively plan for EGD/colonoscopy Friday 3/7.  2-day prep - MiraLAX prep today, Suprep tomorrow - Continue clear liquids - Continue daily CBC and transfuse as needed to maintain HGB > 7 - Continue supportive care   Heather Lutz  11/17/2023, 9:25 AM   Attending physician's note   I have taken a history, reviewed the chart and examined the patient. I performed a substantive portion of this encounter, including complete performance of at least one of the key components, in conjunction with the APP. I agree with the APP's note, impression and recommendations.   78 year old female with chronic iron deficiency anemia, FOBT positive RSV pneumonia improving  Will plan to start bowel prep with MiraLAX,  patient will likely need 2-day bowel prep Tentatively plan for procedure on Friday Clear liquid diet Monitor hemoglobin and transfuse as needed  The patient was provided an opportunity to ask questions and all were answered. The patient agreed with the plan and demonstrated an understanding of the instructions.   Iona Beard , MD [phone]

## 2023-11-17 NOTE — Plan of Care (Signed)
   Problem: Elimination: Goal: Will not experience complications related to bowel motility Outcome: Progressing

## 2023-11-18 DIAGNOSIS — D649 Anemia, unspecified: Secondary | ICD-10-CM | POA: Diagnosis not present

## 2023-11-18 LAB — CBC
HCT: 28.7 % — ABNORMAL LOW (ref 36.0–46.0)
Hemoglobin: 9.1 g/dL — ABNORMAL LOW (ref 12.0–15.0)
MCH: 23.2 pg — ABNORMAL LOW (ref 26.0–34.0)
MCHC: 31.7 g/dL (ref 30.0–36.0)
MCV: 73 fL — ABNORMAL LOW (ref 80.0–100.0)
Platelets: 172 10*3/uL (ref 150–400)
RBC: 3.93 MIL/uL (ref 3.87–5.11)
RDW: 25 % — ABNORMAL HIGH (ref 11.5–15.5)
WBC: 4 10*3/uL (ref 4.0–10.5)
nRBC: 0.5 % — ABNORMAL HIGH (ref 0.0–0.2)

## 2023-11-18 LAB — HEMOGLOBIN AND HEMATOCRIT, BLOOD
HCT: 17.7 % — ABNORMAL LOW (ref 36.0–46.0)
Hemoglobin: 5.1 g/dL — CL (ref 12.0–15.0)

## 2023-11-18 LAB — PREPARE RBC (CROSSMATCH)

## 2023-11-18 MED ORDER — GABAPENTIN 300 MG PO CAPS
300.0000 mg | ORAL_CAPSULE | Freq: Three times a day (TID) | ORAL | Status: DC
  Filled 2023-11-18: qty 1

## 2023-11-18 MED ORDER — SODIUM CHLORIDE 0.9 % IV SOLN: INTRAVENOUS | Status: DC

## 2023-11-18 MED ORDER — NA SULFATE-K SULFATE-MG SULF 17.5-3.13-1.6 GM/177ML PO SOLN
0.5000 | Freq: Once | ORAL | Status: AC
  Administered 2023-11-18: 177 mL via ORAL
  Filled 2023-11-18: qty 1

## 2023-11-18 MED ORDER — BISACODYL 5 MG PO TBEC
10.0000 mg | DELAYED_RELEASE_TABLET | Freq: Once | ORAL | Status: AC
  Administered 2023-11-18: 10 mg via ORAL
  Filled 2023-11-18: qty 2

## 2023-11-18 MED ORDER — SODIUM CHLORIDE 0.9% IV SOLUTION: Freq: Once | INTRAVENOUS | Status: DC

## 2023-11-18 MED ORDER — NA SULFATE-K SULFATE-MG SULF 17.5-3.13-1.6 GM/177ML PO SOLN
0.5000 | Freq: Once | ORAL | Status: AC
  Administered 2023-11-19: 177 mL via ORAL
  Filled 2023-11-18: qty 1

## 2023-11-18 MED ORDER — GABAPENTIN 300 MG PO CAPS
300.0000 mg | ORAL_CAPSULE | Freq: Three times a day (TID) | ORAL | Status: DC | PRN
  Administered 2023-11-19 – 2023-11-20 (×2): 300 mg via ORAL
  Filled 2023-11-18 (×3): qty 1

## 2023-11-18 NOTE — Progress Notes (Addendum)
 PROGRESS NOTE  Heather Lutz WGN:[id number] DOB: 1946/04/19 DOA: 11/15/2023 PCP: Laqueta Due., MD   LOS: 2 days   Brief narrative:   Heather Lutz is a 78 y.o. 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  Also reported left axillary area pain and was taking morphine at home.  In the outside hospital ED, patient tested positive for RSV.  In the ED, hemoglobin was 5.6, patient tested positive for RSV and Fecal occult blood was positive.  Patient was then referred to the hospital for further evaluation and treatment.     Assessment/Plan: Principal Problem:   Acute anemia Active Problems:   Acute blood loss anemia (ABLA)   Heme positive stool   RSV infection   Acute symptomatic anemia secondary to acute blood loss anemia: Has macrocytic picture.  Last documented hemoglobin was 13 in 2023.  Hemoglobin on presentation at 5.6.  Status post 1 unit of packed RBC and hemoglobin today at 9.1.   Seen by GI and plan for EGD/colonoscopy evaluation 11/19/2023.  Currently on 2-day preparation.  Positive balance for 600 mL.    Microcytic anemia: Iron level elevated but ferritin low at 22.  Plan for EGD/ colonoscopy   RSV multifocal viral pneumonia.   Empirically on Rocephin and Zithromax, bronchodilators and supportive care.   Still on supplemental oxygen.  Will continue to wean as able.    History of multiple sclerosis: Wheelchair chair bound at baseline.  Being cared by family at home.  On baclofen and gabapentin as outpatient.     Clinically blind: Supportive care.    Incidental finding of bilateral nephrolithiasis: Possible cystitis. Urine with more than 50 WBC and RBC.could be losing blood from from urinary bladder as well.  CT scan without any  obstructive uropathy diffuse bladder wall thickening.  Already on Rocephin.  Will continue.  Back pain, numbness and subjective shortness of breath.  Likely secondary to sedentary situation.  Frequent turning.  Encourage incentive spirometry.  Add muscle relaxant.  On gabapentin as needed we will continue as scheduled.  DVT prophylaxis: SCDs Start: 11/16/23 0232   Disposition: Home  Status is: Inpatient Remains inpatient appropriate because: Symptomatic anemia for EGD colonoscopy, multifocal viral pneumonia.    Code Status:     Code Status: Full Code  Family Communication: Spoke with the patient's husband 11/16/2023  Consultants: GI  Procedures: PRBC transfusion 1 unit  Anti-infectives:  Azithromycin and Rocephin IV  Anti-infectives (From admission, onward)    Start     Dose/Rate Route Frequency Ordered Stop   11/17/23 0000  azithromycin (ZITHROMAX) 500 mg in sodium chloride 0.9 % 250 mL IVPB        500 mg 250 mL/hr over 60 Minutes Intravenous Every 24 hours 11/16/23 0331     11/16/23 2200  cefTRIAXone (ROCEPHIN) 1 g in sodium chloride 0.9 % 100 mL IVPB        1 g 200 mL/hr over 30 Minutes Intravenous Every 24 hours 11/16/23 0331     11/15/23 2315  cefTRIAXone (ROCEPHIN) 1 g in sodium chloride 0.9 % 100 mL IVPB        1 g 200 mL/hr over 30 Minutes Intravenous  Once 11/15/23 2304 11/15/23 2352   11/15/23 2315  azithromycin (ZITHROMAX) 500 mg in sodium chloride 0.9 %  250 mL IVPB        500 mg 250 mL/hr over 60 Minutes Intravenous  Once 11/15/23 2304 11/16/23 0054       Subjective: Today, patient was seen and examined at bedside.  Patient denies any nausea vomiting fevers chills or rigor but occasionally complains of shortness of breath and pain on the back and left leg with some numbness.  Objective: Vitals:   11/18/23 0714 11/18/23 1138  BP: (!) 118/59 125/60  Pulse: 70 64  Resp: 18 16  Temp: 97.8 F (36.6 C) 97.7 F (36.5 C)  SpO2: 97% 99%    Intake/Output  Summary (Last 24 hours) at 11/18/2023 1148 Last data filed at 11/18/2023 0552 Gross per 24 hour  Intake 350 ml  Output 800 ml  Net -450 ml   Filed Weights   11/15/23 1545 11/16/23 1534  Weight: 65.8 kg 63.8 kg   Body mass index is 24.92 kg/m.   Physical Exam: GENERAL: Patient is alert awake and oriented. Not in obvious distress.  On nasal cannula oxygen. HENT: Mild pallor noted.  Oral mucosa is moist NECK: is supple, no gross swelling noted. CHEST:   Diminished breath sounds bilaterally with coarse breath sounds. CVS: S1 and S2 heard, no murmur. Regular rate and rhythm.  ABDOMEN: Soft, non-tender, bowel sounds are present. EXTREMITIES: No edema. CNS: Cranial nerves are intact.  Bilateral lower extremity weakness noted.  Legally blind. SKIN: warm and dry without rashes.  Data Review: I have personally reviewed the following laboratory data and studies,  CBC: Recent Labs  Lab 11/15/23 1959 11/15/23 2207 11/16/23 0630 11/16/23 0859 11/16/23 1824 11/17/23 0238 11/18/23 0827  WBC 4.3  --  4.4  --   --  3.9* 4.0  NEUTROABS 3.1  --   --   --   --   --   --   HGB 5.6*   < > 7.3* 9.4* 8.7* 8.7* 9.1*  HCT 19.5*   < > 24.6* 30.1* 27.5* 27.6* 28.7*  MCV 64.8*  --  71.7*  --   --  72.4* 73.0*  PLT 237  --  191  --   --  168 172   < > = values in this interval not displayed.   Basic Metabolic Panel: Recent Labs  Lab 11/15/23 1959 11/16/23 0630 11/17/23 0238  NA 133* 139 137  K 3.6 3.6 3.6  CL 101 108 107  CO2 21* 22 20*  GLUCOSE 101* 90 83  BUN 20 14 14   CREATININE 0.94 0.79 0.72  CALCIUM 8.5* 8.6* 8.4*  MG  --   --  1.8   Liver Function Tests: No results for input(s): "AST", "ALT", "ALKPHOS", "BILITOT", "PROT", "ALBUMIN" in the last 168 hours. No results for input(s): "LIPASE", "AMYLASE" in the last 168 hours. No results for input(s): "AMMONIA" in the last 168 hours. Cardiac Enzymes: No results for input(s): "CKTOTAL", "CKMB", "CKMBINDEX", "TROPONINI" in the last 168  hours. BNP (last 3 results) No results for input(s): "BNP" in the last 8760 hours.  ProBNP (last 3 results) No results for input(s): "PROBNP" in the last 8760 hours.  CBG: No results for input(s): "GLUCAP" in the last 168 hours. Recent Results (from the past 240 hours)  Resp panel by RT-PCR (RSV, Flu A&B, Covid) Anterior Nasal Swab     Status: Abnormal   Collection Time: 11/15/23  3:47 PM   Specimen: Anterior Nasal Swab  Result Value Ref Range Status   SARS Coronavirus 2 by RT PCR NEGATIVE  NEGATIVE Final    Comment: (NOTE) SARS-CoV-2 target nucleic acids are NOT DETECTED.  The SARS-CoV-2 RNA is generally detectable in upper respiratory specimens during the acute phase of infection. The lowest concentration of SARS-CoV-2 viral copies this assay can detect is 138 copies/mL. A negative result does not preclude SARS-Cov-2 infection and should not be used as the sole basis for treatment or other patient management decisions. A negative result may occur with  improper specimen collection/handling, submission of specimen other than nasopharyngeal swab, presence of viral mutation(s) within the areas targeted by this assay, and inadequate number of viral copies(<138 copies/mL). A negative result must be combined with clinical observations, patient history, and epidemiological information. The expected result is Negative.  Fact Sheet for Patients:  BloggerCourse.com  Fact Sheet for Healthcare Providers:  SeriousBroker.it  This test is no t yet approved or cleared by the [place] FDA and  has been authorized for detection and/or diagnosis of SARS-CoV-2 by FDA under an Emergency Use Authorization (EUA). This EUA will remain  in effect (meaning this test can be used) for the duration of the COVID-19 declaration under Section 564(b)(1) of the Act, 21 U.S.C.section 360bbb-3(b)(1), unless the authorization is terminated  or revoked  sooner.       Influenza A by PCR NEGATIVE NEGATIVE Final   Influenza B by PCR NEGATIVE NEGATIVE Final    Comment: (NOTE) The Xpert Xpress SARS-CoV-2/FLU/RSV plus assay is intended as an aid in the diagnosis of influenza from Nasopharyngeal swab specimens and should not be used as a sole basis for treatment. Nasal washings and aspirates are unacceptable for Xpert Xpress SARS-CoV-2/FLU/RSV testing.  Fact Sheet for Patients: BloggerCourse.com  Fact Sheet for Healthcare Providers: SeriousBroker.it  This test is not yet approved or cleared by the [place] FDA and has been authorized for detection and/or diagnosis of SARS-CoV-2 by FDA under an Emergency Use Authorization (EUA). This EUA will remain in effect (meaning this test can be used) for the duration of the COVID-19 declaration under Section 564(b)(1) of the Act, 21 U.S.C. section 360bbb-3(b)(1), unless the authorization is terminated or revoked.     Resp Syncytial Virus by PCR POSITIVE (A) NEGATIVE Final    Comment: (NOTE) Fact Sheet for Patients: BloggerCourse.com  Fact Sheet for Healthcare Providers: SeriousBroker.it  This test is not yet approved or cleared by the [place] FDA and has been authorized for detection and/or diagnosis of SARS-CoV-2 by FDA under an Emergency Use Authorization (EUA). This EUA will remain in effect (meaning this test can be used) for the duration of the COVID-19 declaration under Section 564(b)(1) of the Act, 21 U.S.C. section 360bbb-3(b)(1), unless the authorization is terminated or revoked.  Performed at [facility], [address] [place], [place] [address]      Studies: No results found.     Heather Das, MD  [facility] 11/18/2023  If 7PM-7AM, please contact night-coverage

## 2023-11-18 NOTE — Progress Notes (Signed)
 Patient drank/finished all of her bowel prep prior to day of procedure with no complications, will drink the next bottle of bowel prep tomorrow before the procedure.

## 2023-11-18 NOTE — Progress Notes (Signed)
 Patient doing well from respiratory standpoint.  Tolerated prep yesterday.  Plan for clear liquids today and Suprep today with n.p.o. midnight for procedure tomorrow

## 2023-11-18 NOTE — TOC Initial Note (Signed)
 Transition of Care [facility]) - Initial/Assessment Note    Patient Details  Name: Heather Lutz MRN: [id number] Date of Birth: 03-03-1946  Transition of Care [facility]) CM/SW Contact:    Harriet Masson, RN Phone Number: 11/18/2023, 3:16 PM  Clinical Narrative:                 Spoke to patient and husband at bedside regarding transition needs. Patient lives with husband and son assists with care.  Patient has used [facility] in the past and is agreeable to them again if recommended.  Patient has a wheelchair. Address, Phone number and PCP verified. TOC following. Expected Discharge Plan: Home w Home Health Services Barriers to Discharge: Continued Medical Work up   Patient Goals and CMS Choice Patient states their goals for this hospitalization and ongoing recovery are:: return home          Expected Discharge Plan and Services   Discharge Planning Services: CM Consult Post Acute Care Choice: Home Health Living arrangements for the past 2 months: Single Family Home                                      Prior Living Arrangements/Services Living arrangements for the past 2 months: Single Family Home Lives with:: Spouse Patient language and need for interpreter reviewed:: Yes Do you feel safe going back to the place where you live?: Yes      Need for Family Participation in Patient Care: Yes (Comment) Care giver support system in place?: Yes (comment) Current home services: DME (wheelchair) Criminal Activity/Legal Involvement Pertinent to Current Situation/Hospitalization: No - Comment as needed  Activities of Daily Living   ADL Screening (condition at time of admission) Independently performs ADLs?: No Does the patient have a NEW difficulty with bathing/dressing/toileting/self-feeding that is expected to last >3 days?: No (total care patient ; staff must feed) Does the patient have a NEW difficulty with getting in/out of bed, walking, or climbing stairs that is  expected to last >3 days?: No (neeeds assist up with rollater) Does the patient have a NEW difficulty with communication that is expected to last >3 days?: No Is the patient deaf or have difficulty hearing?: No Does the patient have difficulty seeing, even when wearing glasses/contacts?: Yes (Blind) Does the patient have difficulty concentrating, remembering, or making decisions?: No  Permission Sought/Granted                  Emotional Assessment Appearance:: Appears stated age Attitude/Demeanor/Rapport: Engaged Affect (typically observed): Accepting Orientation: : Oriented to Self, Oriented to Place, Oriented to  Time, Oriented to Situation Alcohol / Substance Use: Not Applicable Psych Involvement: No (comment)  Admission diagnosis:  Microcytic anemia [D50.9] Heme positive stool [R19.5] RSV infection [B33.8] Multifocal pneumonia [J18.9] Acute anemia [D64.9] Acute blood loss anemia (ABLA) [D62] Patient Active Problem List   Diagnosis Date Noted   Heme positive stool 11/17/2023   RSV infection 11/17/2023   Acute anemia 11/16/2023   Acute blood loss anemia (ABLA) 11/16/2023   Renal calculi 06/01/2022   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   Primary open angle glaucoma (POAG) of left eye, severe stage 06/21/2017   Age-related osteoporosis without current pathological fracture 08/21/2016   GERD (gastroesophageal reflux disease) 08/21/2016   Spastic gait 08/21/2016   Vitamin D deficiency 08/21/2016   Venous stasis 11/20/2014   Lung nodule < 6cm on CT 11/20/2014   Left lower lobe pneumonia 11/17/2014   Influenza A (H1N1) 11/06/2012   Hematemesis 11/06/2012   CAP (community acquired pneumonia) 11/05/2012   Hypoxemia 11/05/2012   SOB (shortness of breath) 11/05/2012   Multiple sclerosis (HCC) 11/05/2012   PCP:  Laqueta Due., MD Pharmacy:   [id number] - [place], [place] - [address] [place] [address] Phone: [phone] Fax: [phone]  [facility] [place] [facility] [address] [place] [address] Phone: [phone] Fax: [phone]     Social Drivers of Health (SDOH) Social History: SDOH Screenings   Food Insecurity: No Food Insecurity (11/16/2023)  Housing: Low Risk  (11/16/2023)  Transportation Needs: No Transportation Needs (11/16/2023)  Utilities: Not At Risk (11/16/2023)  Social Connections: Moderately Isolated (11/16/2023)  Tobacco Use: Low Risk  (11/15/2023)   SDOH Interventions:     Readmission Risk Interventions     No data to display

## 2023-11-18 NOTE — Plan of Care (Signed)
  Problem: Clinical Measurements: Goal: Will remain free from infection Outcome: Progressing   Problem: Coping: Goal: Level of anxiety will decrease Outcome: Progressing   Problem: Elimination: Goal: Will not experience complications related to bowel motility Outcome: Progressing Goal: Will not experience complications related to urinary retention Outcome: Progressing   Problem: Safety: Goal: Ability to remain free from injury will improve Outcome: Progressing   Problem: Skin Integrity: Goal: Risk for impaired skin integrity will decrease Outcome: Progressing

## 2023-11-18 NOTE — Plan of Care (Signed)
   Problem: Education: Goal: Knowledge of General Education information will improve Description: Including pain rating scale, medication(s)/side effects and non-pharmacologic comfort measures Outcome: Progressing   Problem: Safety: Goal: Ability to remain free from injury will improve Outcome: Progressing

## 2023-11-19 ENCOUNTER — Inpatient Hospital Stay (HOSPITAL_COMMUNITY)

## 2023-11-19 ENCOUNTER — Inpatient Hospital Stay (HOSPITAL_COMMUNITY): Admitting: Certified Registered"

## 2023-11-19 ENCOUNTER — Encounter (HOSPITAL_COMMUNITY)
Admission: EM | Disposition: A | Discharge status: Home Health | Payer: Self-pay | Source: Home / Self Care | Attending: Internal Medicine

## 2023-11-19 ENCOUNTER — Encounter (HOSPITAL_COMMUNITY): Payer: Self-pay | Admitting: Internal Medicine

## 2023-11-19 DIAGNOSIS — K2970 Gastritis, unspecified, without bleeding: Secondary | ICD-10-CM | POA: Diagnosis not present

## 2023-11-19 DIAGNOSIS — K3189 Other diseases of stomach and duodenum: Secondary | ICD-10-CM | POA: Diagnosis not present

## 2023-11-19 DIAGNOSIS — D649 Anemia, unspecified: Secondary | ICD-10-CM | POA: Diagnosis not present

## 2023-11-19 DIAGNOSIS — K259 Gastric ulcer, unspecified as acute or chronic, without hemorrhage or perforation: Secondary | ICD-10-CM

## 2023-11-19 DIAGNOSIS — K254 Chronic or unspecified gastric ulcer with hemorrhage: Secondary | ICD-10-CM | POA: Diagnosis not present

## 2023-11-19 DIAGNOSIS — D509 Iron deficiency anemia, unspecified: Secondary | ICD-10-CM

## 2023-11-19 DIAGNOSIS — K648 Other hemorrhoids: Secondary | ICD-10-CM

## 2023-11-19 DIAGNOSIS — K449 Diaphragmatic hernia without obstruction or gangrene: Secondary | ICD-10-CM | POA: Diagnosis not present

## 2023-11-19 DIAGNOSIS — R195 Other fecal abnormalities: Secondary | ICD-10-CM | POA: Diagnosis not present

## 2023-11-19 DIAGNOSIS — I2510 Atherosclerotic heart disease of native coronary artery without angina pectoris: Secondary | ICD-10-CM

## 2023-11-19 DIAGNOSIS — K644 Residual hemorrhoidal skin tags: Secondary | ICD-10-CM

## 2023-11-19 DIAGNOSIS — K253 Acute gastric ulcer without hemorrhage or perforation: Secondary | ICD-10-CM

## 2023-11-19 LAB — CBC
HCT: 28.7 % — ABNORMAL LOW (ref 36.0–46.0)
Hemoglobin: 8.9 g/dL — ABNORMAL LOW (ref 12.0–15.0)
MCH: 22.8 pg — ABNORMAL LOW (ref 26.0–34.0)
MCHC: 31 g/dL (ref 30.0–36.0)
MCV: 73.4 fL — ABNORMAL LOW (ref 80.0–100.0)
Platelets: 161 10*3/uL (ref 150–400)
RBC: 3.91 MIL/uL (ref 3.87–5.11)
RDW: 25.2 % — ABNORMAL HIGH (ref 11.5–15.5)
WBC: 3.5 10*3/uL — ABNORMAL LOW (ref 4.0–10.5)
nRBC: 0 % (ref 0.0–0.2)

## 2023-11-19 LAB — BASIC METABOLIC PANEL
Anion gap: 5 (ref 5–15)
BUN: 9 mg/dL (ref 8–23)
CO2: 25 mmol/L (ref 22–32)
Calcium: 8.4 mg/dL — ABNORMAL LOW (ref 8.9–10.3)
Chloride: 113 mmol/L — ABNORMAL HIGH (ref 98–111)
Creatinine, Ser: 0.66 mg/dL (ref 0.44–1.00)
GFR, Estimated: >60 mL/min (ref >60)
Glucose, Bld: 83 mg/dL (ref 70–99)
Potassium: 3.3 mmol/L — ABNORMAL LOW (ref 3.5–5.1)
Sodium: 143 mmol/L (ref 135–145)

## 2023-11-19 LAB — URINALYSIS, ROUTINE W REFLEX MICROSCOPIC
Bilirubin Urine: NEGATIVE
Glucose, UA: NEGATIVE mg/dL
Ketones, ur: 20 mg/dL — AB
Nitrite: NEGATIVE
Protein, ur: NEGATIVE mg/dL
RBC / HPF: >50 RBC/hpf (ref 0–5)
Specific Gravity, Urine: 1.013 (ref 1.005–1.030)
WBC, UA: >50 WBC/hpf (ref 0–5)
pH: 5 (ref 5.0–8.0)

## 2023-11-19 LAB — MAGNESIUM: Magnesium: 1.9 mg/dL (ref 1.7–2.4)

## 2023-11-19 SURGERY — COLONOSCOPY
Anesthesia: Monitor Anesthesia Care

## 2023-11-19 MED ORDER — PHENYLEPHRINE HCL (PRESSORS) 10 MG/ML IV SOLN
INTRAVENOUS | Status: DC | PRN
  Administered 2023-11-19 (×2): 80 ug via INTRAVENOUS

## 2023-11-19 MED ORDER — PROPOFOL 500 MG/50ML IV EMUL
INTRAVENOUS | Status: DC | PRN
  Administered 2023-11-19: 100 ug/kg/min via INTRAVENOUS

## 2023-11-19 MED ORDER — LACTATED RINGERS IV SOLN: INTRAVENOUS | Status: DC | PRN

## 2023-11-19 MED ORDER — PANTOPRAZOLE SODIUM 40 MG PO TBEC
40.0000 mg | DELAYED_RELEASE_TABLET | Freq: Two times a day (BID) | ORAL | Status: DC
  Administered 2023-11-19 – 2023-11-20 (×2): 40 mg via ORAL
  Filled 2023-11-19 (×2): qty 1

## 2023-11-19 MED ORDER — POTASSIUM CHLORIDE CRYS ER 20 MEQ PO TBCR
40.0000 meq | EXTENDED_RELEASE_TABLET | Freq: Once | ORAL | Status: AC
  Administered 2023-11-19: 40 meq via ORAL
  Filled 2023-11-19: qty 2

## 2023-11-19 NOTE — Progress Notes (Signed)
 PT Cancellation Note  Patient Details Name: Heather Lutz MRN: [id number] DOB: 10-22-1945   Cancelled Treatment:    Reason Eval/Treat Not Completed: Patient at procedure or test/unavailable.  Will check back as able.    Bevelyn Buckles 11/19/2023, 8:34 AM Raju Coppolino M,PT Acute Rehab Services [phone]

## 2023-11-19 NOTE — Anesthesia Postprocedure Evaluation (Signed)
 Anesthesia Post Note  Patient: Shalandria Elsbernd [facility]  Procedure(s) Performed: COLONOSCOPY EGD (ESOPHAGOGASTRODUODENOSCOPY)     Patient location during evaluation: Endoscopy Anesthesia Type: MAC Level of consciousness: awake and alert Pain management: pain level controlled Vital Signs Assessment: post-procedure vital signs reviewed and stable Respiratory status: spontaneous breathing, nonlabored ventilation, respiratory function stable and patient connected to nasal cannula oxygen Cardiovascular status: stable and blood pressure returned to baseline Postop Assessment: no apparent nausea or vomiting Anesthetic complications: no   No notable events documented.  Last Vitals:  Vitals:   11/19/23 1010 11/19/23 1020  BP: 118/72 128/68  Pulse: 81 77  Resp: 13 16  Temp:    SpO2: 94% 93%    Last Pain:  Vitals:   11/19/23 1020  TempSrc:   PainSc: 7                  [place] Whed[place] S

## 2023-11-19 NOTE — Op Note (Signed)
 [facility] Patient Name: Heather Lutz Procedure Date : 11/19/2023 MRN: [id number] Attending MD: Napoleon Form , MD, [id number] Date of Birth: 08-17-1946 CSN: [id number] Age: 78 Admit Type: Inpatient Procedure:                Upper GI endoscopy Indications:              Gastrointestinal bleeding of unknown origin,                            Suspected upper gastrointestinal bleeding in                            patient with unexplained iron deficiency anemia Providers:                Napoleon Form, MD, Margaree Mackintosh, RN,                            Priscella Mann, Technician Referring MD:              Medicines:                Monitored Anesthesia Care Complications:            No immediate complications. Estimated Blood Loss:     Estimated blood loss was minimal. Procedure:                Pre-Anesthesia Assessment:                           - Prior to the procedure, a History and Physical                            was performed, and patient medications and                            allergies were reviewed. The patient's tolerance of                            previous anesthesia was also reviewed. The risks                            and benefits of the procedure and the sedation                            options and risks were discussed with the patient.                            All questions were answered, and informed consent                            was obtained. Prior Anticoagulants: The patient has                            taken no anticoagulant or antiplatelet agents  except for aspirin. ASA Grade Assessment: III - A                            patient with severe systemic disease. After                            reviewing the risks and benefits, the patient was                            deemed in satisfactory condition to undergo the                            procedure.                           After  obtaining informed consent, the endoscope was                            passed under direct vision. Throughout the                            procedure, the patient's blood pressure, pulse, and                            oxygen saturations were monitored continuously. The                            [id number]-[id number] ([id number]) Olympus endoscope was introduced                            through the mouth, and advanced to the second part                            of duodenum. The upper GI endoscopy was                            accomplished without difficulty. The patient                            tolerated the procedure well. Scope In: Scope Out: Findings:      The Z-line was regular and was found 36 cm from the incisors.      No gross lesions were noted in the entire esophagus.      One non-bleeding cratered gastric ulcer with no stigmata of bleeding was       found in the prepyloric region of the stomach. The lesion was 6 mm in       largest dimension. Biopsies were taken with a cold forceps for histology.      Patchy mild inflammation characterized by congestion (edema), erosions,       erythema and friability was found in the entire examined stomach.       Biopsies were taken with a cold forceps for Helicobacter pylori testing.      A 5 cm hiatal hernia was present.      The examined duodenum was normal. Impression:               -  Z-line regular, 36 cm from the incisors.                           - No gross lesions in the entire esophagus.                           - Non-bleeding gastric ulcer with no stigmata of                            bleeding. Biopsied.                           - Gastritis. Biopsied.                           - 5 cm hiatal hernia.                           - Normal examined duodenum. Recommendation:           - Patient has a contact number available for                            emergencies. The signs and symptoms of potential                            delayed  complications were discussed with the                            patient. Return to normal activities tomorrow.                            Written discharge instructions were provided to the                            patient.                           - Resume previous diet.                           - Continue present medications.                           - Await pathology results.                           - Use Protonix (pantoprazole) 40 mg PO BID.                           - GI signing off, available if have any questions Procedure Code(s):        --- Professional ---                           [id number], Esophagogastroduodenoscopy, flexible,  transoral; with biopsy, single or multiple Diagnosis Code(s):        --- Professional ---                           K25.9, Gastric ulcer, unspecified as acute or                            chronic, without hemorrhage or perforation                           K29.70, Gastritis, unspecified, without bleeding                           K44.9, Diaphragmatic hernia without obstruction or                            gangrene                           K92.2, Gastrointestinal hemorrhage, unspecified                           D50.9, Iron deficiency anemia, unspecified CPT copyright 2022 American Medical Association. All rights reserved. The codes documented in this report are preliminary and upon coder review may  be revised to meet current compliance requirements. Napoleon Form, MD 11/19/2023 10:12:25 AM This report has been signed electronically. Number of Addenda: 0

## 2023-11-19 NOTE — Progress Notes (Signed)
 OT Cancellation Note  Patient Details Name: Heather Lutz MRN: [id number] DOB: 1946-05-12   Cancelled Treatment:    Reason Eval/Treat Not Completed: Patient at procedure or test/ unavailable. Will follow.  Evern Bio 11/19/2023, 8:40 AM Berna Spare, OTR/L Acute Rehabilitation Services Office: [phone]

## 2023-11-19 NOTE — Progress Notes (Addendum)
 PROGRESS NOTE  Heather Lutz GNF:[id number] DOB: 03-17-1946 DOA: 11/15/2023 PCP: Laqueta Due., MD   LOS: 3 days   Brief narrative:   Heather Lutz is a 78 y.o. 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  Also reported left axillary area pain and was taking morphine at home.  In the outside hospital ED, patient tested positive for RSV.  In the ED, hemoglobin was 5.6, patient tested positive for RSV and Fecal occult blood was positive.  Patient was then referred to the hospital for further evaluation and treatment.     Assessment/Plan: Principal Problem:   Acute anemia Active Problems:   Acute blood loss anemia (ABLA)   Heme positive stool   RSV infection   Acute gastric ulcer without hemorrhage or perforation   Acute symptomatic anemia secondary to acute blood loss anemia:  Has macrocytic picture.  Last documented hemoglobin was 13 in 2023.  Hemoglobin on presentation at 5.6.  Status post 1 unit of packed RBC and hemoglobin today at 9.1.   Seen by GI and underwent EGD and colonoscopy on 11/19/2023 with findings of nonbleeding gastric ulcer and gastritis with hiatal hernia..  Colonoscopy showed nonbleeding external and internal hemorrhoids.  Patient was advised Protonix twice daily.  Biopsies were taken.    Microcytic anemia: Iron level elevated but ferritin low at 22.  Status post EGD and colonoscopy.  Will give 1 dose of IV iron prior to discharge.   RSV multifocal viral pneumonia.   Empirically on Rocephin and Zithromax, bronchodilators and supportive care.  Improved.  Off supplemental oxygen, on room air.    History of multiple sclerosis: Wheelchair chair bound at baseline.  Being cared by family at home.  On baclofen and gabapentin as  outpatient.  Complains of the right flank pain and bladder pain today.  Will get bladder scan, renal ultrasound and repeat urinalysis.     Clinically blind: Supportive care.  Mild hypokalemia.  Will replace orally.  Check levels in AM.    Incidental finding of bilateral nephrolithiasis: Possible cystitis. Urine with more than 50 WBC and RBC.could be losing blood from from urinary bladder as well.  CT scan without any obstructive uropathy diffuse bladder wall thickening.  Already on IV Rocephin.  Check renal ultrasound and bladder scan today.  Repeat urinalysis.  Back pain, numbness and subjective shortness of breath.  Likely secondary to sedentary situation.  Frequent turning.  Encourage incentive spirometry.  Continue gabapentin as needed.  Muscle relaxant.  Renal ultrasound and bladder scan.  Debility deconditioning.  Patient requesting PT OT evaluation.  Pending.  DVT prophylaxis: SCDs Start: 11/16/23 0232   Disposition: Home  Status is: Inpatient Remains inpatient appropriate because: Symptomatic anemia status post EGD colonoscopy, pending PT OT evaluation.     Code Status:     Code Status: Full Code  Family Communication: Spoke with the patient's husband 11/19/2023  Consultants: GI  Procedures: PRBC transfusion 1 unit EGD and colonoscopy with biopsy on 11/19/2023  Anti-infectives:  Azithromycin and Rocephin IV  Anti-infectives (From admission, onward)    Start     Dose/Rate Route Frequency Ordered Stop   11/17/23 0000  azithromycin (ZITHROMAX) 500 mg in sodium chloride 0.9 % 250 mL IVPB        500 mg 250 mL/hr  over 60 Minutes Intravenous Every 24 hours 11/16/23 0331     11/16/23 2200  cefTRIAXone (ROCEPHIN) 1 g in sodium chloride 0.9 % 100 mL IVPB        1 g 200 mL/hr over 30 Minutes Intravenous Every 24 hours 11/16/23 0331     11/15/23 2315  cefTRIAXone (ROCEPHIN) 1 g in sodium chloride 0.9 % 100 mL IVPB        1 g 200 mL/hr over 30 Minutes Intravenous  Once  11/15/23 2304 11/15/23 2352   11/15/23 2315  azithromycin (ZITHROMAX) 500 mg in sodium chloride 0.9 % 250 mL IVPB        500 mg 250 mL/hr over 60 Minutes Intravenous  Once 11/15/23 2304 11/16/23 0054       Subjective: Today, patient was seen and examined at bedside.  Patient complains of pain over the suprapubic area which is constant, pain over the right flank.  Some dysuria.  For EGD colonoscopy.  Objective: Vitals:   11/19/23 1010 11/19/23 1020  BP: 118/72 128/68  Pulse: 81 77  Resp: 13 16  Temp:    SpO2: 94% 93%    Intake/Output Summary (Last 24 hours) at 11/19/2023 1127 Last data filed at 11/19/2023 1003 Gross per 24 hour  Intake 1218.92 ml  Output 400 ml  Net 818.92 ml   Filed Weights   11/15/23 1545 11/16/23 1534 11/19/23 0836  Weight: 65.8 kg 63.8 kg 63.8 kg   Body mass index is 24.92 kg/m.   Physical Exam:  GENERAL: Patient is alert awake and oriented. Not in obvious distress.  On nasal cannula oxygen. HENT: Mild pallor noted.  Oral mucosa is moist, legally blind NECK: is supple, no gross swelling noted. CHEST:   Diminished breath sounds bilaterally, no wheezes or crackles. CVS: S1 and S2 heard, no murmur. Regular rate and rhythm.  ABDOMEN: Soft, non-tender, bowel sounds are present.  Suprapubic area tenderness noted. EXTREMITIES: No edema. CNS: Cranial nerves are intact.  Bilateral lower extremity weakness noted.  Legally blind. SKIN: warm and dry without rashes.  Data Review: I have personally reviewed the following laboratory data and studies,  CBC: Recent Labs  Lab 11/15/23 1959 11/15/23 2207 11/16/23 0630 11/16/23 0859 11/16/23 1824 11/17/23 0238 11/18/23 0827 11/19/23 0220  WBC 4.3  --  4.4  --   --  3.9* 4.0 3.5*  NEUTROABS 3.1  --   --   --   --   --   --   --   HGB 5.6*   < > 7.3* 9.4* 8.7* 8.7* 9.1* 8.9*  HCT 19.5*   < > 24.6* 30.1* 27.5* 27.6* 28.7* 28.7*  MCV 64.8*  --  71.7*  --   --  72.4* 73.0* 73.4*  PLT 237  --  191  --   --  168  172 161   < > = values in this interval not displayed.   Basic Metabolic Panel: Recent Labs  Lab 11/15/23 1959 11/16/23 0630 11/17/23 0238 11/19/23 0220  NA 133* 139 137 143  K 3.6 3.6 3.6 3.3*  CL 101 108 107 113*  CO2 21* 22 20* 25  GLUCOSE 101* 90 83 83  BUN 20 14 14 9   CREATININE 0.94 0.79 0.72 0.66  CALCIUM 8.5* 8.6* 8.4* 8.4*  MG  --   --  1.8 1.9   Liver Function Tests: No results for input(s): "AST", "ALT", "ALKPHOS", "BILITOT", "PROT", "ALBUMIN" in the last 168 hours. No results for input(s): "LIPASE", "AMYLASE" in  the last 168 hours. No results for input(s): "AMMONIA" in the last 168 hours. Cardiac Enzymes: No results for input(s): "CKTOTAL", "CKMB", "CKMBINDEX", "TROPONINI" in the last 168 hours. BNP (last 3 results) No results for input(s): "BNP" in the last 8760 hours.  ProBNP (last 3 results) No results for input(s): "PROBNP" in the last 8760 hours.  CBG: No results for input(s): "GLUCAP" in the last 168 hours. Recent Results (from the past 240 hours)  Resp panel by RT-PCR (RSV, Flu A&B, Covid) Anterior Nasal Swab     Status: Abnormal   Collection Time: 11/15/23  3:47 PM   Specimen: Anterior Nasal Swab  Result Value Ref Range Status   SARS Coronavirus 2 by RT PCR NEGATIVE NEGATIVE Final    Comment: (NOTE) SARS-CoV-2 target nucleic acids are NOT DETECTED.  The SARS-CoV-2 RNA is generally detectable in upper respiratory specimens during the acute phase of infection. The lowest concentration of SARS-CoV-2 viral copies this assay can detect is 138 copies/mL. A negative result does not preclude SARS-Cov-2 infection and should not be used as the sole basis for treatment or other patient management decisions. A negative result may occur with  improper specimen collection/handling, submission of specimen other than nasopharyngeal swab, presence of viral mutation(s) within the areas targeted by this assay, and inadequate number of viral copies(<138 copies/mL).  A negative result must be combined with clinical observations, patient history, and epidemiological information. The expected result is Negative.  Fact Sheet for Patients:  BloggerCourse.com  Fact Sheet for Healthcare Providers:  SeriousBroker.it  This test is no t yet approved or cleared by the [place] FDA and  has been authorized for detection and/or diagnosis of SARS-CoV-2 by FDA under an Emergency Use Authorization (EUA). This EUA will remain  in effect (meaning this test can be used) for the duration of the COVID-19 declaration under Section 564(b)(1) of the Act, 21 U.S.C.section 360bbb-3(b)(1), unless the authorization is terminated  or revoked sooner.       Influenza A by PCR NEGATIVE NEGATIVE Final   Influenza B by PCR NEGATIVE NEGATIVE Final    Comment: (NOTE) The Xpert Xpress SARS-CoV-2/FLU/RSV plus assay is intended as an aid in the diagnosis of influenza from Nasopharyngeal swab specimens and should not be used as a sole basis for treatment. Nasal washings and aspirates are unacceptable for Xpert Xpress SARS-CoV-2/FLU/RSV testing.  Fact Sheet for Patients: BloggerCourse.com  Fact Sheet for Healthcare Providers: SeriousBroker.it  This test is not yet approved or cleared by the [place] FDA and has been authorized for detection and/or diagnosis of SARS-CoV-2 by FDA under an Emergency Use Authorization (EUA). This EUA will remain in effect (meaning this test can be used) for the duration of the COVID-19 declaration under Section 564(b)(1) of the Act, 21 U.S.C. section 360bbb-3(b)(1), unless the authorization is terminated or revoked.     Resp Syncytial Virus by PCR POSITIVE (A) NEGATIVE Final    Comment: (NOTE) Fact Sheet for Patients: BloggerCourse.com  Fact Sheet for Healthcare  Providers: SeriousBroker.it  This test is not yet approved or cleared by the [place] FDA and has been authorized for detection and/or diagnosis of SARS-CoV-2 by FDA under an Emergency Use Authorization (EUA). This EUA will remain in effect (meaning this test can be used) for the duration of the COVID-19 declaration under Section 564(b)(1) of the Act, 21 U.S.C. section 360bbb-3(b)(1), unless the authorization is terminated or revoked.  Performed at [facility], [address] [place], [place] [address]  Studies: No results found.     Joycelyn Das, MD  [facility] 11/19/2023  If 7PM-7AM, please contact night-coverage

## 2023-11-19 NOTE — Op Note (Signed)
 [facility] Patient Name: Heather Lutz Procedure Date : 11/19/2023 MRN: [id number] Attending MD: Napoleon Form , MD, [id number] Date of Birth: [birth year] CSN: [id number] Age: 78 Admit Type: Inpatient Procedure:                Colonoscopy Indications:              Evaluation of unexplained GI bleeding presenting                            with fecal occult blood, Unexplained iron                            deficiency anemia Providers:                Napoleon Form, MD, Margaree Mackintosh, RN,                            Priscella Mann, Technician Referring MD:              Medicines:                Monitored Anesthesia Care Complications:            No immediate complications. Estimated Blood Loss:     Estimated blood loss was minimal. Procedure:                Pre-Anesthesia Assessment:                           - Prior to the procedure, a History and Physical                            was performed, and patient medications and                            allergies were reviewed. The patient's tolerance of                            previous anesthesia was also reviewed. The risks                            and benefits of the procedure and the sedation                            options and risks were discussed with the patient.                            All questions were answered, and informed consent                            was obtained. Prior Anticoagulants: The patient has                            taken no anticoagulant or antiplatelet agents                            except for  aspirin. ASA Grade Assessment: III - A                            patient with severe systemic disease. After                            reviewing the risks and benefits, the patient was                            deemed in satisfactory condition to undergo the                            procedure.                           After obtaining informed consent, the  colonoscope                            was passed under direct vision. Throughout the                            procedure, the patient's blood pressure, pulse, and                            oxygen saturations were monitored continuously. The                            PCF-HQ190TL ([id number] Olympus peds colonoscope was                            introduced through the anus and advanced to the the                            cecum, identified by appendiceal orifice and                            ileocecal valve. The colonoscopy was performed                            without difficulty. The patient tolerated the                            procedure well. Scope In: 9:39:12 AM Scope Out: 9:51:05 AM Scope Withdrawal Time: 0 hours 8 minutes 31 seconds  Total Procedure Duration: 0 hours 11 minutes 53 seconds  Findings:      The perianal and digital rectal examinations were normal.      The terminal ileum appeared normal.      Non-bleeding external and internal hemorrhoids were found during       retroflexion. The hemorrhoids were medium-sized. Impression:               - The examined portion of the ileum was normal.                           - Non-bleeding external and internal hemorrhoids.                           -  No specimens collected. Recommendation:           - Patient has a contact number available for                            emergencies. The signs and symptoms of potential                            delayed complications were discussed with the                            patient. Return to normal activities tomorrow.                            Written discharge instructions were provided to the                            patient.                           - Resume previous diet.                           - Continue present medications.                           - No repeat colonoscopy due to age.                           - See the other procedure note for documentation of                             additional recommendations. Procedure Code(s):        --- Professional ---                           [phone], Colonoscopy, flexible; diagnostic, including                            collection of specimen(s) by brushing or washing,                            when performed (separate procedure) Diagnosis Code(s):        --- Professional ---                           K64.8, Other hemorrhoids                           R19.5, Other fecal abnormalities                           D50.9, Iron deficiency anemia, unspecified CPT copyright 2022 [employer]. All rights reserved. The codes documented in this report are preliminary and upon coder review may  be revised to meet current compliance requirements. Napoleon Form, MD 11/19/2023 10:07:34 AM This report has been signed electronically. Number of Addenda: 0

## 2023-11-19 NOTE — Transfer of Care (Signed)
 Immediate Anesthesia Transfer of Care Note  Patient: Heather Lutz [facility]  Procedure(s) Performed: COLONOSCOPY EGD (ESOPHAGOGASTRODUODENOSCOPY)  Patient Location: PACU  Anesthesia Type:MAC  Level of Consciousness: awake and alert   Airway & Oxygen Therapy: Patient Spontanous Breathing and Patient connected to nasal cannula oxygen  Post-op Assessment: Report given to RN and Post -op Vital signs reviewed and stable  Post vital signs: Reviewed and stable  Last Vitals:  Vitals Value Taken Time  BP    Temp    Pulse    Resp    SpO2      Last Pain:  Vitals:   11/19/23 0836  TempSrc: Temporal  PainSc: 7          Complications: No notable events documented.

## 2023-11-19 NOTE — Interval H&P Note (Signed)
 History and Physical Interval Note:  11/19/2023 9:16 AM  [name]  has presented today for surgery, with the diagnosis of Heme positive stool, anemia.  The various methods of treatment have been discussed with the patient and family. After consideration of risks, benefits and other options for treatment, the patient has consented to  Procedure(s): COLONOSCOPY (N/A) EGD (ESOPHAGOGASTRODUODENOSCOPY) (N/A) as a surgical intervention.  The patient's history has been reviewed, patient examined, no change in status, stable for surgery.  I have reviewed the patient's chart and labs.  Questions were answered to the patient's satisfaction.     Heather Lutz

## 2023-11-19 NOTE — Evaluation (Signed)
 Occupational Therapy Evaluation and Discharge Patient Details Name: Heather Lutz MRN: [id number] DOB: 07-Jun-1946 Today's Date: 11/19/2023   History of Present Illness   Pt is a 78 year old woman admitted on 11/17/23 with acute hypoxic respiratory failure, RSV, CAP and anemia with heme positive stool. PMH: MS, blindness.     Clinical Impressions Pt requires total assist for all mobility. Her family uses her rollator vs travel chair to transport pt. Pt can self feed with spoon. She completes grooming seated at sink, sponge bathes seated on commode and is assisted for dressing and all IADLs at home. Pt demonstrates B UE weakness with decreased sensation in B hands she reports has been ongoing x 6  months and zero sitting and standing balance. She needs max to total assist for ADLs in this setting. Pt is requesting HHOT to address UE function. Will defer further OT to [facility].     If plan is discharge home, recommend the following:   A lot of help with walking and/or transfers;A lot of help with bathing/dressing/bathroom;Assistance with cooking/housework;Assistance with feeding;Direct supervision/assist for medications management;Direct supervision/assist for financial management;Assist for transportation;Help with stairs or ramp for entrance     Functional Status Assessment   Patient has had a recent decline in their functional status and/or demonstrates limited ability to make significant improvements in function in a reasonable and predictable amount of time     Equipment Recommendations   None recommended by OT     Recommendations for Other Services         Precautions/Restrictions   Precautions Precautions: Fall Restrictions Weight Bearing Restrictions Per Provider Order: No     Mobility Bed Mobility Overal bed mobility: Needs Assistance Bed Mobility: Supine to Sit, Sit to Supine     Supine to sit: +2 for physical assistance, Total assist Sit to supine: +2 for  physical assistance, Total assist   General bed mobility comments: assist for all aspects    Transfers Overall transfer level: Needs assistance   Transfers: Bed to chair/wheelchair/BSC     Squat pivot transfers: Total assist, +2 physical assistance       General transfer comment: essentially lifted by her husband      Balance Overall balance assessment: Needs assistance   Sitting balance-Leahy Scale: Zero       Standing balance-Leahy Scale: Zero                             ADL either performed or assessed with clinical judgement   ADL                                         General ADL Comments: near or at baseline     Vision Baseline Vision/History: 2 Legally blind Patient Visual Report: No change from baseline       Perception         Praxis         Pertinent Vitals/Pain Pain Assessment Pain Assessment: No/denies pain     Extremity/Trunk Assessment Upper Extremity Assessment Upper Extremity Assessment: Right hand dominant;RUE deficits/detail;LUE deficits/detail RUE Sensation: decreased light touch RUE Coordination: decreased fine motor;decreased gross motor LUE Sensation: decreased light touch LUE Coordination: decreased fine motor;decreased gross motor   Lower Extremity Assessment Lower Extremity Assessment: Defer to PT evaluation   Cervical / Trunk Assessment Cervical / Trunk Assessment: Kyphotic;Other  exceptions (weakness)   Communication Communication Communication: No apparent difficulties   Cognition Arousal: Alert Behavior During Therapy: WFL for tasks assessed/performed Cognition: No apparent impairments             OT - Cognition Comments: directs care, able to offer home set up and PLOF with assist of husband                 Following commands: Intact       Cueing  General Comments      83 bpm,96%RA,   Exercises     Shoulder Instructions      Home Living Family/patient  expects to be discharged to:: Private residence Living Arrangements: Spouse/significant other;Children;Other (Comment) (adult son) Available Help at Discharge: Family;Personal care attendant (CNA 3 hour a day,) Type of Home: House Home Access: Level entry     Home Layout: One level     Bathroom Shower/Tub: Sponge bathes at baseline   Bathroom Toilet: Standard Bathroom Accessibility: Yes   Home Equipment: Designer, multimedia (4 wheels);Lift chair;Transport chair;Wheelchair - power   Additional Comments: Stays in lift chair all day.      Prior Functioning/Environment Prior Level of Function : Needs assist             Mobility Comments: Pt was transferred by family PTA with max to total assist. Pt is moved around home on rollator throughtout day and transport chair outdoors ADLs Comments: Pt is moved on rollator from place to place but does participate in bathing after set up as well as can dress some of herself, wears Depends, self feeds with set up using a spoon    OT Problem List: Decreased strength   OT Treatment/Interventions:        OT Goals(Current goals can be found in the care plan section)       OT Frequency:       Co-evaluation PT/OT/SLP Co-Evaluation/Treatment: Yes Reason for Co-Treatment: Complexity of the patient's impairments (multi-system involvement)   OT goals addressed during session: ADL's and self-care;Strengthening/ROM      AM-PAC OT "6 Clicks" Daily Activity     Outcome Measure Help from another person eating meals?: Total Help from another person taking care of personal grooming?: A Lot Help from another person toileting, which includes using toliet, bedpan, or urinal?: Total Help from another person bathing (including washing, rinsing, drying)?: Total Help from another person to put on and taking off regular upper body clothing?: A Lot Help from another person to put on and taking off regular lower body  clothing?: Total 6 Click Score: 8   End of Session Nurse Communication: Mobility status  Activity Tolerance: Patient tolerated treatment well Patient left: in bed;with call bell/phone within reach;with bed alarm set;with family/visitor present  OT Visit Diagnosis: Muscle weakness (generalized) (M62.81)                Time: 1610-9604 OT Time Calculation (min): 42 min Charges:  OT General Charges $OT Visit: 1 Visit OT Evaluation $OT Eval Moderate Complexity: 1 Mod  Berna Spare, OTR/L Acute Rehabilitation Services Office: [phone]   Evern Bio 11/19/2023, 1:51 PM

## 2023-11-19 NOTE — TOC Transition Note (Addendum)
 Transition of Care [facility]) - Discharge Note   Patient Details  Name: Heather Lutz MRN: [id number] Date of Birth: 09-28-1945  Transition of Care [facility]) CM/SW Contact:  Lockie Pares, RN Phone Number: 11/19/2023, 2:19 PM   Clinical Narrative:     Patient is discharging today needs home health PT and OT.  Patient prefers [facility]. Called Becky Sax at [facility], she accepted patient for start date of Monday she will call the patient later this evening to set up services No further needs identified Final next level of care: Home w Home Health Services Barriers to Discharge: No Barriers Identified   Patient Goals and CMS Choice Patient states their goals for this hospitalization and ongoing recovery are:: return home          Discharge Placement             Home with Home health          Discharge Plan and Services Additional resources added to the After Visit Summary for     Discharge Planning Services: CM Consult Post Acute Care Choice: Home Health                    HH Arranged: PT, OT [facility] Agency: [facility] Date [facility] Agency Contacted: 11/19/23 Time HH Agency Contacted: 1415 Representative spoke with at [facility] Agency: Becky Sax  Social Drivers of Health (SDOH) Interventions SDOH Screenings   Food Insecurity: No Food Insecurity (11/16/2023)  Housing: Low Risk  (11/16/2023)  Transportation Needs: No Transportation Needs (11/16/2023)  Utilities: Not At Risk (11/16/2023)  Social Connections: Moderately Isolated (11/16/2023)  Tobacco Use: Low Risk  (11/19/2023)     Readmission Risk Interventions     No data to display

## 2023-11-19 NOTE — Anesthesia Preprocedure Evaluation (Signed)
 Anesthesia Evaluation  Patient identified by MRN, date of birth, ID band Patient awake    Reviewed: Allergy & Precautions, H&P , NPO status , Patient's Chart, lab work & pertinent test results  Airway Mallampati: II   Neck ROM: full    Dental   Pulmonary neg pulmonary ROS   breath sounds clear to auscultation       Cardiovascular + CAD  + dysrhythmias  Rhythm:regular Rate:Normal     Neuro/Psych  Headaches Multiple sclerosis  Neuromuscular disease    GI/Hepatic ,GERD  ,,  Endo/Other    Renal/GU stones     Musculoskeletal   Abdominal   Peds  Hematology   Anesthesia Other Findings   Reproductive/Obstetrics                             Anesthesia Physical Anesthesia Plan  ASA: 3  Anesthesia Plan: MAC   Post-op Pain Management:    Induction: Intravenous  PONV Risk Score and Plan: 2 and Propofol infusion and Treatment may vary due to age or medical condition  Airway Management Planned: Nasal Cannula  Additional Equipment:   Intra-op Plan:   Post-operative Plan:   Informed Consent: I have reviewed the patients History and Physical, chart, labs and discussed the procedure including the risks, benefits and alternatives for the proposed anesthesia with the patient or authorized representative who has indicated his/her understanding and acceptance.     Dental advisory given  Plan Discussed with: CRNA, Anesthesiologist and Surgeon  Anesthesia Plan Comments:        Anesthesia Quick Evaluation

## 2023-11-19 NOTE — Evaluation (Signed)
 Physical Therapy Evaluation Patient Details Name: Heather Lutz MRN: [id number] DOB: 1945-11-06 Today's Date: 11/19/2023  History of Present Illness  Pt is a 78 year old woman admitted on 11/17/23 with acute hypoxic respiratory failure, RSV, CAP and anemia with heme positive stool. PMH: MS, blindness.  Clinical Impression  Pt admitted with above diagnosis. Pt was unable to assist with any mobility today and is total assist of 2 for bed mobility and transfers.  Husband present and he demonstrated transfer of pt bed to chair with good technique. Pt is motivated to work with PT and OT therefore feel its appropriate to recommend f/u for a trial of therapy at home. Will follow acutely and try and progress pt as well.   Pt currently with functional limitations due to the deficits listed below (see PT Problem List). Pt will benefit from acute skilled PT to increase their independence and safety with mobility to allow discharge.           If plan is discharge home, recommend the following: Two people to help with walking and/or transfers;Two people to help with bathing/dressing/bathroom;Assistance with cooking/housework;Assistance with feeding;Direct supervision/assist for medications management;Direct supervision/assist for financial management;Assist for transportation;Help with stairs or ramp for entrance   Can travel by private vehicle        Equipment Recommendations None recommended by PT  Recommendations for Other Services       Functional Status Assessment Patient has had a recent decline in their functional status and/or demonstrates limited ability to make significant improvements in function in a reasonable and predictable amount of time     Precautions / Restrictions Precautions Precautions: Fall Restrictions Weight Bearing Restrictions Per Provider Order: No      Mobility  Bed Mobility Overal bed mobility: Needs Assistance Bed Mobility: Supine to Sit, Sit to Supine      Supine to sit: +2 for physical assistance, Total assist Sit to supine: +2 for physical assistance, Total assist   General bed mobility comments: assist for all aspects    Transfers Overall transfer level: Needs assistance   Transfers: Bed to chair/wheelchair/BSC       Squat pivot transfers: Total assist, +2 physical assistance     General transfer comment: essentially lifted by her husband to chair.  PT and OT assisted pt back to bed with side by side transfer and noted that pts' LEs extend if not blocked.  Husband demonstrated good technique and is aware of pts limitations.    Ambulation/Gait                  Stairs            Wheelchair Mobility     Tilt Bed    Modified Rankin (Stroke Patients Only)       Balance Overall balance assessment: Needs assistance Sitting-balance support: Bilateral upper extremity supported, No upper extremity supported, Single extremity supported, Feet supported Sitting balance-Leahy Scale: Zero Sitting balance - Comments: cannot sit up on EOB on her own.   Standing balance support: Bilateral upper extremity supported, During functional activity Standing balance-Leahy Scale: Zero Standing balance comment: Cannot stand up without total assist and pt doesnt assist much at all.                             Pertinent Vitals/Pain Pain Assessment Pain Assessment: No/denies pain    Home Living Family/patient expects to be discharged to:: Private residence Living Arrangements: Spouse/significant other;Children;Other (  Comment) (adult son) Available Help at Discharge: Family;Personal care attendant (CNA 3 hour a day,) Type of Home: House Home Access: Level entry       Home Layout: One level Home Equipment: Designer, multimedia (4 wheels);Lift chair;Transport chair;Wheelchair - power Additional Comments: Stays in lift chair all day.    Prior Function Prior Level of Function : Needs  assist             Mobility Comments: Pt was transferred by family PTA with max to total assist. Pt is moved around home on rollator throughtout day and transport chair outdoors ADLs Comments: Pt is moved on rollator from place to place but does participate in bathing after set up as well as can dress some of herself, wears Depends, self feeds with set up using a spoon     Extremity/Trunk Assessment   Upper Extremity Assessment Upper Extremity Assessment: Defer to OT evaluation RUE Sensation: decreased light touch RUE Coordination: decreased fine motor;decreased gross motor LUE Sensation: decreased light touch LUE Coordination: decreased fine motor;decreased gross motor    Lower Extremity Assessment Lower Extremity Assessment: RLE deficits/detail;LLE deficits/detail RLE Deficits / Details: tone noted and difficult for pt to complete full ROM, grossly 2-/5 LLE Deficits / Details: tone noted and difficult for pt to complete full ROM, grossly 2-/5    Cervical / Trunk Assessment Cervical / Trunk Assessment: Kyphotic;Other exceptions (weakness)  Communication   Communication Communication: No apparent difficulties    Cognition Arousal: Alert Behavior During Therapy: WFL for tasks assessed/performed   PT - Cognitive impairments: No apparent impairments                         Following commands: Intact       Cueing Cueing Techniques: Verbal cues, Tactile cues     General Comments General comments (skin integrity, edema, etc.): 83 bpm,96%RA,    Exercises     Assessment/Plan    PT Assessment Patient needs continued PT services  PT Problem List Decreased activity tolerance;Decreased balance;Decreased mobility;Decreased strength;Decreased range of motion;Decreased knowledge of use of DME;Decreased safety awareness       PT Treatment Interventions DME instruction;Functional mobility training;Therapeutic activities;Therapeutic exercise;Balance  training;Patient/family education    PT Goals (Current goals can be found in the Care Plan section)  Acute Rehab PT Goals Patient Stated Goal: to go home PT Goal Formulation: With patient/family Time For Goal Achievement: 12/03/23 Potential to Achieve Goals: Fair    Frequency Min 1X/week     Co-evaluation PT/OT/SLP Co-Evaluation/Treatment: Yes Reason for Co-Treatment: Complexity of the patient's impairments (multi-system involvement) PT goals addressed during session: Mobility/safety with mobility OT goals addressed during session: ADL's and self-care;Strengthening/ROM       AM-PAC PT "6 Clicks" Mobility  Outcome Measure Help needed turning from your back to your side while in a flat bed without using bedrails?: Total Help needed moving from lying on your back to sitting on the side of a flat bed without using bedrails?: Total Help needed moving to and from a bed to a chair (including a wheelchair)?: Total Help needed standing up from a chair using your arms (e.g., wheelchair or bedside chair)?: Total Help needed to walk in hospital room?: Total Help needed climbing 3-5 steps with a railing? : Total 6 Click Score: 6    End of Session   Activity Tolerance: Patient limited by fatigue Patient left: in bed;with call bell/phone within reach;with bed alarm set;with family/visitor present Nurse Communication: Mobility  status;Need for lift equipment PT Visit Diagnosis: Muscle weakness (generalized) (M62.81)    Time: 1610-9604 PT Time Calculation (min) (ACUTE ONLY): 37 min   Charges:   PT Evaluation $PT Eval Moderate Complexity: 1 Mod   PT General Charges $$ ACUTE PT VISIT: 1 Visit         Laurna Shetley M,PT Acute Rehab Services [phone]   Bevelyn Buckles 11/19/2023, 3:49 PM

## 2023-11-20 DIAGNOSIS — D649 Anemia, unspecified: Secondary | ICD-10-CM | POA: Diagnosis not present

## 2023-11-20 LAB — BPAM RBC
Blood Product Expiration Date: 202503132359
Blood Product Expiration Date: 202503142359
Blood Product Expiration Date: 202503152359
ISSUE DATE / TIME: 202503040317
ISSUE DATE / TIME: 202503040552
Unit Type and Rh: 5100
Unit Type and Rh: 5100
Unit Type and Rh: 5100

## 2023-11-20 LAB — TYPE AND SCREEN
ABO/RH(D): O POS
Antibody Screen: NEGATIVE
Unit division: 0
Unit division: 0
Unit division: 0

## 2023-11-20 LAB — CBC
HCT: 28.8 % — ABNORMAL LOW (ref 36.0–46.0)
Hemoglobin: 9.1 g/dL — ABNORMAL LOW (ref 12.0–15.0)
MCH: 22.8 pg — ABNORMAL LOW (ref 26.0–34.0)
MCHC: 31.6 g/dL (ref 30.0–36.0)
MCV: 72 fL — ABNORMAL LOW (ref 80.0–100.0)
Platelets: 183 10*3/uL (ref 150–400)
RBC: 4 MIL/uL (ref 3.87–5.11)
RDW: 26.6 % — ABNORMAL HIGH (ref 11.5–15.5)
WBC: 3.8 10*3/uL — ABNORMAL LOW (ref 4.0–10.5)
nRBC: 0.5 % — ABNORMAL HIGH (ref 0.0–0.2)

## 2023-11-20 LAB — BASIC METABOLIC PANEL
Anion gap: 7 (ref 5–15)
BUN: 7 mg/dL — ABNORMAL LOW (ref 8–23)
CO2: 26 mmol/L (ref 22–32)
Calcium: 8.6 mg/dL — ABNORMAL LOW (ref 8.9–10.3)
Chloride: 107 mmol/L (ref 98–111)
Creatinine, Ser: 0.77 mg/dL (ref 0.44–1.00)
GFR, Estimated: >60 mL/min (ref >60)
Glucose, Bld: 85 mg/dL (ref 70–99)
Potassium: 3.7 mmol/L (ref 3.5–5.1)
Sodium: 140 mmol/L (ref 135–145)

## 2023-11-20 MED ORDER — PANTOPRAZOLE SODIUM 40 MG PO TBEC: 40.0000 mg | DELAYED_RELEASE_TABLET | Freq: Two times a day (BID) | ORAL | 0 refills | Status: DC

## 2023-11-20 NOTE — Discharge Summary (Signed)
 Physician Discharge Summary  [name]:[id number] DOB: 31-Dec-1945 DOA: 11/15/2023  PCP: Laqueta Due., MD  Admit date: 11/15/2023 Discharge date: 11/21/2023  Admitted From: Home  Discharge disposition: Home with home health   Recommendations for Outpatient Follow-Up:   Follow up with your primary care provider in one week.  Check CBC, BMP, magnesium in the next visit Follow-up with your urologist for kidney stones and bladder issues.  Discharge Diagnosis:   Principal Problem:   Acute anemia Active Problems:   Acute blood loss anemia (ABLA)   Heme positive stool   RSV infection   Acute gastric ulcer without hemorrhage or perforation   Discharge Condition: Improved.  Diet recommendation:  Regular.  Wound care: None.  Code status: Full.   History of Present Illness:    Heather Lutz is a 78 y.o. 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  Also reported left axillary area pain and was taking morphine at home.  In the outside hospital ED, patient tested positive for RSV.  In the ED, hemoglobin was 5.6, patient tested positive for RSV and Fecal occult blood was positive.  Patient was then referred to the hospital for further evaluation and treatment.     Hospital Course:   Following conditions were addressed during hospitalization as listed below,  Acute symptomatic anemia secondary to acute blood loss anemia:  Has macrocytic picture.  Last documented hemoglobin was 13 in 2023.  Hemoglobin on presentation at 5.6.  Status post 1 unit of packed RBC and hemoglobin today at 9.1.   Seen by GI and underwent EGD and colonoscopy on 11/19/2023 with findings of nonbleeding gastric ulcer and gastritis with hiatal hernia..   Colonoscopy showed nonbleeding external and internal hemorrhoids.  Patient was advised Protonix twice daily.  Biopsies were taken.  Recommended today avoid aspirin and Aleve if possible as outpatient.  Recommend outpatient PCP follow-up with hemoglobin    Microcytic anemia: Iron level elevated but ferritin low at 22.  Status post EGD and colonoscopy.  Received 1 dose of IV iron.   RSV multifocal viral pneumonia.   Empirically on Rocephin and Zithromax, bronchodilators and supportive care.  Improved.  Off supplemental oxygen, on room air.    History of multiple sclerosis: Wheelchair chair bound at baseline.  Being cared by family at home.  On baclofen and gabapentin as outpatient.  Complains of the right flank pain and bladder pain today.  Will get bladder scan, renal ultrasound and repeat urinalysis.     Clinically blind: Supportive care.   Mild hypokalemia.  Replenished and improved.    Incidental finding of bilateral nephrolithiasis: Possible cystitis. Urine with more than 50 WBC and RBC.could be losing blood from from urinary bladder as well.  CT scan without any obstructive uropathy diffuse bladder wall thickening.  Received IV Rocephin during hospitalization.  Renal ultrasound send nephrolithiasis but not no hydronephrosis.  Bladder scan with no residual urine and able to urinate.  Analysis with similar findings as before.  I had a prolonged discussion with the patient about it.  Offered some medications for bladder spasms but wishes to follow-up with urologist to discuss about kidney stones and bladder dysfunction.  Will have encouraged her for this.    Debility deconditioning.  PT OT has recommended home health  PT on discharge.  Arrange for home health on discharge.  Disposition.  At this time, patient is stable for disposition home with outpatient PCP and urology follow-up.  Spoke with the patient's husband 11/19/2023.  Medical Consultants:   GI  Procedures:   PRBC transfusion 1  unit EGD and colonoscopy with biopsy on 11/19/2023  Subjective:   Today, patient was seen and examined at bedside.  Feels okay.  Denies any shortness of breath cough fever pain nausea or vomiting.  Wishes to go home.  Discharge Exam:   Vitals:   11/20/23 0756 11/20/23 1212  BP: (!) 152/80 (!) 146/73  Pulse:    Resp: 15   Temp: 98.5 F (36.9 C) 98.1 F (36.7 C)  SpO2:     Vitals:   11/19/23 1929 11/19/23 2349 11/20/23 0756 11/20/23 1212  BP: (!) 151/84 (!) 148/78 (!) 152/80 (!) 146/73  Pulse: 88 87    Resp: 17 19 15    Temp: 98.3 F (36.8 C) 98.4 F (36.9 C) 98.5 F (36.9 C) 98.1 F (36.7 C)  TempSrc: Oral Oral Oral Oral  SpO2: 93% 94%    Weight:      Height:       Body mass index is 24.92 kg/m.  General: Alert awake, not in obvious distress, legally blind. HENT: Mild pallor noted.  Oral mucosa is moist.  Chest:  Clear breath sounds.  Diminished breath sounds bilaterally. No crackles or wheezes.  CVS: S1 &S2 heard. No murmur.  Regular rate and rhythm. Abdomen: Soft, nontender, nondistended.  Bowel sounds are heard.  Suprapubic area discomfort. Extremities: No cyanosis, clubbing or edema.  Peripheral pulses are palpable. Psych: Alert, awake and oriented, normal mood CNS:  No cranial nerve deficits.  Bilateral lower extremity weakness, Skin: Warm and dry.  No rashes noted.  The results of significant diagnostics from this hospitalization (including imaging, microbiology, ancillary and laboratory) are listed below for reference.     Diagnostic Studies:   CT ABDOMEN PELVIS W CONTRAST Result Date: 11/16/2023 CLINICAL DATA:  Lower GI bleed.  Congested, weakness, body aches EXAM: CT ABDOMEN AND PELVIS WITH CONTRAST TECHNIQUE: Multidetector CT imaging of the abdomen and pelvis was performed using the standard protocol following bolus administration of intravenous contrast. RADIATION DOSE REDUCTION: This exam was performed according to the departmental dose-optimization program  which includes automated exposure control, adjustment of the mA and/or kV according to patient size and/or use of iterative reconstruction technique. CONTRAST:  OMNIPAQUE IOHEXOL 300 MG/ML  SOLN COMPARISON:  CT 04/28/2022 FINDINGS: Lower chest: Bilateral lower lobe pneumonia. Hepatobiliary: No acute abnormality. Pancreas: Unremarkable. Spleen: Unremarkable. Adrenals/Urinary Tract: Stable adrenal glands. Bilateral nephrolithiasis. There is a stone in the in the right renal pelvis with mild adjacent inflammatory stranding. No hydronephrosis. No obstructing stones. Diffuse bladder wall thickening. Stomach/Bowel: No bowel obstruction or bowel wall thickening. Normal appendix. Moderate to large hiatal hernia. Vascular/Lymphatic: Aortic atherosclerosis. No enlarged abdominal or pelvic lymph nodes. Reproductive: Hysterectomy. Other: No free intraperitoneal fluid or air. Musculoskeletal: No acute fracture. IMPRESSION: 1. Bilateral lower lobe pneumonia. 2. Bilateral nephrolithiasis. There is a stone in the right renal pelvis with mild adjacent inflammatory stranding. No hydronephrosis. 3. Diffuse bladder wall thickening. Correlate with urinalysis to exclude cystitis. 4. Moderate to large hiatal hernia. 5. Aortic Atherosclerosis (ICD10-I70.0). Electronically Signed   By: Minerva Fester M.D.   On: 11/16/2023 00:46   DG Chest 2 View Result Date: 11/15/2023 CLINICAL DATA:  RSV, shortness of breath EXAM: CHEST - 2 VIEW COMPARISON:  06/11/2021 FINDINGS: Stable cardiomediastinal silhouette. Bilateral lower lobe airspace opacities. No pleural effusion or pneumothorax. No displaced rib fractures. IMPRESSION: Multifocal pneumonia. Follow-up after treatment is recommended to ensure resolution. Electronically Signed   By: Minerva Fester M.D.   On: 11/15/2023 22:32     Labs:   Basic Metabolic Panel: Recent Labs  Lab 11/15/23 1959 11/16/23 0630 11/17/23 0238 11/19/23 0220 11/20/23 0803  NA 133* 139 137 143 140  K  3.6 3.6 3.6 3.3* 3.7  CL 101 108 107 113* 107  CO2 21* 22 20* 25 26  GLUCOSE 101* 90 83 83 85  BUN 20 14 14 9  7*  CREATININE 0.94 0.79 0.72 0.66 0.77  CALCIUM 8.5* 8.6* 8.4* 8.4* 8.6*  MG  --   --  1.8 1.9  --    GFR Estimated Creatinine Clearance: 52.2 mL/min (by C-G formula based on SCr of 0.77 mg/dL). Liver Function Tests: No results for input(s): "AST", "ALT", "ALKPHOS", "BILITOT", "PROT", "ALBUMIN" in the last 168 hours. No results for input(s): "LIPASE", "AMYLASE" in the last 168 hours. No results for input(s): "AMMONIA" in the last 168 hours. Coagulation profile No results for input(s): "INR", "PROTIME" in the last 168 hours.  CBC: Recent Labs  Lab 11/15/23 1959 11/15/23 2207 11/16/23 0630 11/16/23 0859 11/16/23 1824 11/17/23 0238 11/18/23 0827 11/19/23 0220 11/20/23 0803  WBC 4.3  --  4.4  --   --  3.9* 4.0 3.5* 3.8*  NEUTROABS 3.1  --   --   --   --   --   --   --   --   HGB 5.6*   < > 7.3*   < > 8.7* 8.7* 9.1* 8.9* 9.1*  HCT 19.5*   < > 24.6*   < > 27.5* 27.6* 28.7* 28.7* 28.8*  MCV 64.8*  --  71.7*  --   --  72.4* 73.0* 73.4* 72.0*  PLT 237  --  191  --   --  168 172 161 183   < > = values in this interval not displayed.   Cardiac Enzymes: No results for input(s): "CKTOTAL", "CKMB", "CKMBINDEX", "TROPONINI" in the last 168 hours. BNP: Invalid input(s): "POCBNP" CBG: No results for input(s): "GLUCAP" in the last 168 hours. D-Dimer No results for input(s): "DDIMER" in the last 72 hours. Hgb A1c No results for input(s): "HGBA1C" in the last 72 hours. Lipid Profile No results for input(s): "CHOL", "HDL", "LDLCALC", "TRIG", "CHOLHDL", "LDLDIRECT" in the last 72 hours. Thyroid function studies No results for input(s): "TSH", "T4TOTAL", "T3FREE", "THYROIDAB" in the last 72 hours.  Invalid input(s): "FREET3" Anemia work up No results for input(s): "VITAMINB12", "FOLATE", "FERRITIN", "TIBC", "IRON", "RETICCTPCT" in the last 72 hours. Microbiology Recent  Results (from the past 240 hours)  Resp panel by RT-PCR (RSV, Flu A&B, Covid) Anterior Nasal Swab     Status: Abnormal   Collection Time: 11/15/23  3:47 PM   Specimen: Anterior Nasal Swab  Result Value Ref Range Status   SARS Coronavirus 2 by RT PCR NEGATIVE NEGATIVE Final    Comment: (NOTE) SARS-CoV-2 target nucleic acids are NOT DETECTED.  The SARS-CoV-2 RNA is generally detectable in upper respiratory specimens during the acute phase of infection. The lowest concentration of SARS-CoV-2 viral copies this assay can detect is 138 copies/mL. A negative result does not preclude SARS-Cov-2 infection and should not be used as the sole basis for treatment or other patient management decisions. A negative result may occur with  improper specimen collection/handling, submission  of specimen other than nasopharyngeal swab, presence of viral mutation(s) within the areas targeted by this assay, and inadequate number of viral copies(<138 copies/mL). A negative result must be combined with clinical observations, patient history, and epidemiological information. The expected result is Negative.  Fact Sheet for Patients:  BloggerCourse.com  Fact Sheet for Healthcare Providers:  SeriousBroker.it  This test is no t yet approved or cleared by the [place] FDA and  has been authorized for detection and/or diagnosis of SARS-CoV-2 by FDA under an Emergency Use Authorization (EUA). This EUA will remain  in effect (meaning this test can be used) for the duration of the COVID-19 declaration under Section 564(b)(1) of the Act, 21 U.S.C.section 360bbb-3(b)(1), unless the authorization is terminated  or revoked sooner.       Influenza A by PCR NEGATIVE NEGATIVE Final   Influenza B by PCR NEGATIVE NEGATIVE Final    Comment: (NOTE) The Xpert Xpress SARS-CoV-2/FLU/RSV plus assay is intended as an aid in the diagnosis of influenza from Nasopharyngeal  swab specimens and should not be used as a sole basis for treatment. Nasal washings and aspirates are unacceptable for Xpert Xpress SARS-CoV-2/FLU/RSV testing.  Fact Sheet for Patients: BloggerCourse.com  Fact Sheet for Healthcare Providers: SeriousBroker.it  This test is not yet approved or cleared by the [place] FDA and has been authorized for detection and/or diagnosis of SARS-CoV-2 by FDA under an Emergency Use Authorization (EUA). This EUA will remain in effect (meaning this test can be used) for the duration of the COVID-19 declaration under Section 564(b)(1) of the Act, 21 U.S.C. section 360bbb-3(b)(1), unless the authorization is terminated or revoked.     Resp Syncytial Virus by PCR POSITIVE (A) NEGATIVE Final    Comment: (NOTE) Fact Sheet for Patients: BloggerCourse.com  Fact Sheet for Healthcare Providers: SeriousBroker.it  This test is not yet approved or cleared by the [place] FDA and has been authorized for detection and/or diagnosis of SARS-CoV-2 by FDA under an Emergency Use Authorization (EUA). This EUA will remain in effect (meaning this test can be used) for the duration of the COVID-19 declaration under Section 564(b)(1) of the Act, 21 U.S.C. section 360bbb-3(b)(1), unless the authorization is terminated or revoked.  Performed at [facility], [address] [place], [place] [address]      Discharge Instructions:   Discharge Instructions     Call MD for:  persistant nausea and vomiting   Complete by: As directed    Call MD for:  severe uncontrolled pain   Complete by: As directed    Diet general   Complete by: As directed    Discharge instructions   Complete by: As directed    Follow-up with your primary care provider in 1 week.  Check blood work at that time.  Seek medical attention for worsening symptoms.  Continue to  take Protonix as prescribed.  Please try to avoid over-the-counter pain medication including Motrin Aleve aspirin if possible due to ulcer.  Okay to take Tylenol.  Follow-up with your urologist as outpatient for bladder dysfunction kidney stones.   Increase activity slowly   Complete by: As directed       Allergies as of 11/20/2023       Reactions   Banana Itching   Other Itching   Walnuts, honeydew, canteloupe,   Watermelon [citrullus Vulgaris] Itching   Epinephrine Anxiety, Other (See Comments)   Hallucinations         Medication List  STOP taking these medications    aspirin EC 325 MG tablet   naproxen sodium 220 MG tablet Commonly known as: ALEVE       TAKE these medications    acetaminophen 500 MG tablet Commonly known as: TYLENOL Take 1,000 mg by mouth every 6 (six) hours as needed for mild pain.   b complex vitamins capsule Take 1 capsule by mouth daily.   baclofen 10 MG tablet Commonly known as: LIORESAL Take 1 tablet (10 mg total) by mouth 3 (three) times daily as needed for muscle spasms.   carboxymethylcellulose 0.5 % Soln Commonly known as: REFRESH PLUS Place 2 drops into both eyes 2 (two) times daily as needed (dry eyes).   ELDERBERRY PO Take 1 tablet by mouth daily.   ferrous sulfate 325 (65 FE) MG tablet Take 325 mg by mouth daily with breakfast.   FIBER ADULT GUMMIES PO Take 1 tablet by mouth daily.   gabapentin 300 MG capsule Commonly known as: Neurontin Take 1 capsule (300 mg total) by mouth 3 (three) times daily. What changed:  when to take this reasons to take this   multivitamin with minerals Tabs tablet Take 1 tablet by mouth daily.   pantoprazole 40 MG tablet Commonly known as: PROTONIX Take 1 tablet (40 mg total) by mouth 2 (two) times daily before a meal.   VITAMIN D3 PO Take 1 tablet by mouth daily.        Follow-up Information     [facility] [place], [place] Follow up.   Why: They will call you this evening   to schedule Becky Sax liason Contact information: [address] [place] [address] [phone]         Laqueta Due., MD Follow up in 1 week(s).   Specialty: Internal Medicine Contact information: [address] [place] [address] [phone]                  Time coordinating discharge: 39 minutes  Signed:  Teva Bronkema  [facility] 11/21/2023, 8:49 AM

## 2023-11-20 NOTE — TOC Transition Note (Signed)
 Transition of Care [facility]) - Discharge Note   Patient Details  Name: Heather Lutz MRN: [id number] Date of Birth: 07-Oct-1945  Transition of Care [facility] CM/SW Contact:  Ronny Bacon, RN Phone Number: 11/20/2023, 10:17 AM   Clinical Narrative:   Patient is being discharged today. Elnita Maxwell with [facility] made aware.    Final next level of care: Home w Home Health Services Barriers to Discharge: No Barriers Identified   Patient Goals and CMS Choice Patient states their goals for this hospitalization and ongoing recovery are:: return home          Discharge Placement                       Discharge Plan and Services Additional resources added to the After Visit Summary for     Discharge Planning Services: CM Consult Post Acute Care Choice: Home Health                    HH Arranged: PT, OT [facility] Agency: [facility] Date [facility] Agency Contacted: 11/19/23 Time HH Agency Contacted: 1415 Representative spoke with at [facility] Agency: Becky Sax  Social Drivers of Health (SDOH) Interventions SDOH Screenings   Food Insecurity: No Food Insecurity (11/16/2023)  Housing: Low Risk  (11/16/2023)  Transportation Needs: No Transportation Needs (11/16/2023)  Utilities: Not At Risk (11/16/2023)  Social Connections: Moderately Isolated (11/16/2023)  Tobacco Use: Low Risk  (11/19/2023)     Readmission Risk Interventions     No data to display

## 2023-11-21 ENCOUNTER — Encounter (HOSPITAL_COMMUNITY): Payer: Self-pay | Admitting: Gastroenterology

## 2023-11-22 LAB — SURGICAL PATHOLOGY

## 2023-12-24 ENCOUNTER — Emergency Department (HOSPITAL_BASED_OUTPATIENT_CLINIC_OR_DEPARTMENT_OTHER)

## 2023-12-24 ENCOUNTER — Inpatient Hospital Stay (HOSPITAL_BASED_OUTPATIENT_CLINIC_OR_DEPARTMENT_OTHER)
Admission: EM | Admit: 2023-12-24 | Discharge: 2023-12-26 | DRG: 689 | Disposition: A | Discharge status: Home Health | Attending: Family Medicine | Admitting: Family Medicine

## 2023-12-24 ENCOUNTER — Encounter (HOSPITAL_BASED_OUTPATIENT_CLINIC_OR_DEPARTMENT_OTHER): Payer: Self-pay | Admitting: Emergency Medicine

## 2023-12-24 DIAGNOSIS — Z90710 Acquired absence of both cervix and uterus: Secondary | ICD-10-CM | POA: Diagnosis not present

## 2023-12-24 DIAGNOSIS — Z960 Presence of urogenital implants: Secondary | ICD-10-CM | POA: Diagnosis not present

## 2023-12-24 DIAGNOSIS — Z91018 Allergy to other foods: Secondary | ICD-10-CM

## 2023-12-24 DIAGNOSIS — Z8249 Family history of ischemic heart disease and other diseases of the circulatory system: Secondary | ICD-10-CM | POA: Diagnosis not present

## 2023-12-24 DIAGNOSIS — N390 Urinary tract infection, site not specified: Principal | ICD-10-CM | POA: Diagnosis present

## 2023-12-24 DIAGNOSIS — G35 Multiple sclerosis: Secondary | ICD-10-CM | POA: Diagnosis not present

## 2023-12-24 DIAGNOSIS — K219 Gastro-esophageal reflux disease without esophagitis: Secondary | ICD-10-CM | POA: Diagnosis not present

## 2023-12-24 DIAGNOSIS — Z79899 Other long term (current) drug therapy: Secondary | ICD-10-CM | POA: Diagnosis not present

## 2023-12-24 DIAGNOSIS — D649 Anemia, unspecified: Secondary | ICD-10-CM | POA: Diagnosis present

## 2023-12-24 DIAGNOSIS — G35D Multiple sclerosis, unspecified: Secondary | ICD-10-CM | POA: Diagnosis present

## 2023-12-24 DIAGNOSIS — H548 Legal blindness, as defined in USA: Secondary | ICD-10-CM | POA: Diagnosis present

## 2023-12-24 DIAGNOSIS — T428X5A Adverse effect of antiparkinsonism drugs and other central muscle-tone depressants, initial encounter: Secondary | ICD-10-CM | POA: Diagnosis present

## 2023-12-24 DIAGNOSIS — G9341 Metabolic encephalopathy: Secondary | ICD-10-CM | POA: Diagnosis present

## 2023-12-24 DIAGNOSIS — Z993 Dependence on wheelchair: Secondary | ICD-10-CM | POA: Diagnosis not present

## 2023-12-24 DIAGNOSIS — Z87442 Personal history of urinary calculi: Secondary | ICD-10-CM | POA: Diagnosis not present

## 2023-12-24 DIAGNOSIS — M810 Age-related osteoporosis without current pathological fracture: Secondary | ICD-10-CM | POA: Diagnosis not present

## 2023-12-24 DIAGNOSIS — R4182 Altered mental status, unspecified: Principal | ICD-10-CM

## 2023-12-24 LAB — CBC WITH DIFFERENTIAL/PLATELET
Abs Immature Granulocytes: 0 10*3/uL (ref 0.00–0.07)
Basophils Absolute: 0 10*3/uL (ref 0.0–0.1)
Basophils Relative: 1 %
Eosinophils Absolute: 0.1 10*3/uL (ref 0.0–0.5)
Eosinophils Relative: 2 %
HCT: 38.4 % (ref 36.0–46.0)
Hemoglobin: 11.8 g/dL — ABNORMAL LOW (ref 12.0–15.0)
Immature Granulocytes: 0 %
Lymphocytes Relative: 44 %
Lymphs Abs: 1.5 10*3/uL (ref 0.7–4.0)
MCH: 23.6 pg — ABNORMAL LOW (ref 26.0–34.0)
MCHC: 30.7 g/dL (ref 30.0–36.0)
MCV: 76.8 fL — ABNORMAL LOW (ref 80.0–100.0)
Monocytes Absolute: 0.4 10*3/uL (ref 0.1–1.0)
Monocytes Relative: 12 %
Neutro Abs: 1.4 10*3/uL — ABNORMAL LOW (ref 1.7–7.7)
Neutrophils Relative %: 41 %
Platelets: 174 10*3/uL (ref 150–400)
RBC: 5 MIL/uL (ref 3.87–5.11)
RDW: 27.3 % — ABNORMAL HIGH (ref 11.5–15.5)
Smear Review: NORMAL
WBC: 3.3 10*3/uL — ABNORMAL LOW (ref 4.0–10.5)
nRBC: 0 % (ref 0.0–0.2)

## 2023-12-24 LAB — I-STAT VENOUS BLOOD GAS, ED
Acid-Base Excess: 1 mmol/L (ref 0.0–2.0)
Bicarbonate: 26 mmol/L (ref 20.0–28.0)
Calcium, Ion: 1.27 mmol/L (ref 1.15–1.40)
HCT: 35 % — ABNORMAL LOW (ref 36.0–46.0)
Hemoglobin: 11.9 g/dL — ABNORMAL LOW (ref 12.0–15.0)
O2 Saturation: 61 %
Potassium: 3.7 mmol/L (ref 3.5–5.1)
Sodium: 143 mmol/L (ref 135–145)
TCO2: 27 mmol/L (ref 22–32)
pCO2, Ven: 43 mmHg — ABNORMAL LOW (ref 44–60)
pH, Ven: 7.389 (ref 7.25–7.43)
pO2, Ven: 32 mmHg (ref 32–45)

## 2023-12-24 LAB — COMPREHENSIVE METABOLIC PANEL WITH GFR
ALT: 20 U/L (ref 0–44)
AST: 33 U/L (ref 15–41)
Albumin: 3.8 g/dL (ref 3.5–5.0)
Alkaline Phosphatase: 119 U/L (ref 38–126)
Anion gap: 12 (ref 5–15)
BUN: 19 mg/dL (ref 8–23)
CO2: 26 mmol/L (ref 22–32)
Calcium: 10.5 mg/dL — ABNORMAL HIGH (ref 8.9–10.3)
Chloride: 104 mmol/L (ref 98–111)
Creatinine, Ser: 0.78 mg/dL (ref 0.44–1.00)
GFR, Estimated: >60 mL/min (ref >60)
Glucose, Bld: 89 mg/dL (ref 70–99)
Potassium: 3.8 mmol/L (ref 3.5–5.1)
Sodium: 142 mmol/L (ref 135–145)
Total Bilirubin: 0.5 mg/dL (ref 0.0–1.2)
Total Protein: 8.3 g/dL — ABNORMAL HIGH (ref 6.5–8.1)

## 2023-12-24 LAB — URINALYSIS, W/ REFLEX TO CULTURE (INFECTION SUSPECTED)
Bilirubin Urine: NEGATIVE
Glucose, UA: NEGATIVE mg/dL
Ketones, ur: 15 mg/dL — AB
Nitrite: POSITIVE — AB
Protein, ur: 100 mg/dL — AB
RBC / HPF: >50 RBC/hpf (ref 0–5)
Specific Gravity, Urine: >=1.03 (ref 1.005–1.030)
WBC, UA: >50 WBC/hpf (ref 0–5)
pH: 6 (ref 5.0–8.0)

## 2023-12-24 LAB — CBG MONITORING, ED: Glucose-Capillary: 89 mg/dL (ref 70–99)

## 2023-12-24 LAB — AMMONIA: Ammonia: <10 umol/L (ref 9–35)

## 2023-12-24 MED ORDER — SODIUM CHLORIDE 0.9 % IV SOLN
2.0000 g | Freq: Once | INTRAVENOUS | Status: AC
  Administered 2023-12-24: 2 g via INTRAVENOUS
  Filled 2023-12-24: qty 20

## 2023-12-24 MED ORDER — ONDANSETRON HCL 4 MG PO TABS: 4.0000 mg | ORAL_TABLET | Freq: Four times a day (QID) | ORAL | Status: DC | PRN

## 2023-12-24 MED ORDER — ACETAMINOPHEN 325 MG PO TABS
650.0000 mg | ORAL_TABLET | Freq: Four times a day (QID) | ORAL | Status: DC | PRN
  Administered 2023-12-25 (×2): 650 mg via ORAL
  Filled 2023-12-24 (×3): qty 2

## 2023-12-24 MED ORDER — SODIUM CHLORIDE 0.9% FLUSH
3.0000 mL | Freq: Two times a day (BID) | INTRAVENOUS | Status: DC
  Administered 2023-12-24 – 2023-12-26 (×4): 3 mL via INTRAVENOUS

## 2023-12-24 MED ORDER — SENNOSIDES-DOCUSATE SODIUM 8.6-50 MG PO TABS: 1.0000 | ORAL_TABLET | Freq: Every evening | ORAL | Status: DC | PRN

## 2023-12-24 MED ORDER — SODIUM CHLORIDE 0.9 % IV SOLN
1.0000 g | INTRAVENOUS | Status: DC
  Administered 2023-12-25 – 2023-12-26 (×2): 1 g via INTRAVENOUS
  Filled 2023-12-24 (×2): qty 10

## 2023-12-24 MED ORDER — ACETAMINOPHEN 325 MG PO TABS
650.0000 mg | ORAL_TABLET | Freq: Once | ORAL | Status: AC
  Administered 2023-12-24: 650 mg via ORAL
  Filled 2023-12-24: qty 2

## 2023-12-24 MED ORDER — ENOXAPARIN SODIUM 40 MG/0.4ML IJ SOSY
40.0000 mg | PREFILLED_SYRINGE | INTRAMUSCULAR | Status: DC
  Administered 2023-12-24 – 2023-12-25 (×2): 40 mg via SUBCUTANEOUS
  Filled 2023-12-24 (×2): qty 0.4

## 2023-12-24 MED ORDER — ACETAMINOPHEN 650 MG RE SUPP: 650.0000 mg | Freq: Four times a day (QID) | RECTAL | Status: DC | PRN

## 2023-12-24 MED ORDER — ONDANSETRON HCL 4 MG/2ML IJ SOLN
4.0000 mg | Freq: Four times a day (QID) | INTRAMUSCULAR | Status: DC | PRN
Start: 2023-12-24 — End: 2023-12-26

## 2023-12-24 MED ORDER — PANTOPRAZOLE SODIUM 40 MG PO TBEC
40.0000 mg | DELAYED_RELEASE_TABLET | Freq: Two times a day (BID) | ORAL | Status: DC
  Administered 2023-12-25: 40 mg via ORAL
  Filled 2023-12-24 (×2): qty 1

## 2023-12-24 NOTE — Progress Notes (Signed)
 Plan of Care Note for accepted transfer   Patient: Heather Lutz MRN: [id number]   DOA: 12/24/2023  Facility requesting transfer: [facility] Requesting Provider: Gwyneth Sprout, MD Reason for transfer: AMS/?UTI/?medication induced from Baclofen Facility course:   Heather Lutz is a 78 year old female with past medical history significant for multiple sclerosis in which she is wheelchair/bedbound at baseline, GERD, recent RSV viral infection who presented to med [facility] with confusion.  Typically at baseline she is "sharp" mentally.  Husband and son noticed a change in her mentation yesterday, recently started back on baclofen.  Patient is afebrile without leukocytosis.  Urinalysis with large leukocytes, positive nitrite, many bacteria, greater than 50 WBCs; but she has had similar findings on urinalysis in the past.  Family reports no other noted symptoms or signs to include urinary symptoms, fever, abdominal pain, shortness of breath or cough leading up to this confusion.  Unclear etiology of her altered mental status, considerations include UTI, medication induced from baclofen versus MS flare.  Discussed with ED physician to obtain urine culture, blood cultures and okay to start ceftriaxone thereafter for coverage.  Plan of care: The patient is accepted for admission to Med-surg  unit, at [facility]. Consider further imaging with MR brain/C-spine to rule out acute MS flare, abdominal imaging with previous history of  ureteral stent placement by urology 06/22/2022; and chest xray.  Author: Alvira Philips [place], DO 12/24/2023  Check [email or web address] for on-call coverage.  Nursing staff, Please call [facility] Admits & Consults System-Wide number on Amion as soon as patient's arrival, so appropriate admitting provider can evaluate the pt.

## 2023-12-24 NOTE — ED Provider Notes (Signed)
 [place] [facility]MENT AT [facility] Provider Note   CSN: [id number] Arrival date & time: 12/24/23  1056     History  Chief Complaint  Patient presents with   Altered Mental Status    Heather Lutz is a 78 y.o. female.  Patient is a 78 year old female with a history of MS that which has caused her to be unable to walk and have blindness, anemia and recent RSV with pneumonia last month who is presenting today with her family due to confusion and change in mental status.  They report that patient is normally very mentally sharp and has no issues with confusion until yesterday.  They noticed a little bit in the morning but by afternoon she seemed more globally confused with sleeping more often and just not acting herself.  They called her doctor but they did not have an appointment till today.  This morning when she woke up the confusion has persisted and they became worried and talked with her doctor who recommended she come to the emergency room.  She did recently discontinue her gabapentin and started back on baclofen 2 days ago which is a medication she had taken in the past other than that has had no other medication changes.  She has not specifically complained of a headache, chest pain or shortness of breath.  She has not had significant diarrhea or any complaints of abdominal pain.  They report that she just is very slow to respond and will repeat the same thing over and over again which is not like her.  The history is provided by the spouse and a relative.  Altered Mental Status      Home Medications Prior to Admission medications   Medication Sig Start Date End Date Taking? Authorizing Provider  acetaminophen (TYLENOL) 500 MG tablet Take 1,000 mg by mouth every 6 (six) hours as needed for mild pain.    [provider]  b complex vitamins capsule Take 1 capsule by mouth daily.    [provider]  baclofen (LIORESAL) 10 MG tablet Take 1  tablet (10 mg total) by mouth 3 (three) times daily as needed for muscle spasms. 09/22/17   Benjiman Core, MD  carboxymethylcellulose (REFRESH PLUS) 0.5 % SOLN Place 2 drops into both eyes 2 (two) times daily as needed (dry eyes).    [provider]  Cholecalciferol (VITAMIN D3 PO) Take 1 tablet by mouth daily.    [provider]  ELDERBERRY PO Take 1 tablet by mouth daily.    [provider]  ferrous sulfate 325 (65 FE) MG tablet Take 325 mg by mouth daily with breakfast.    [provider]  FIBER ADULT GUMMIES PO Take 1 tablet by mouth daily.    [provider]  gabapentin (NEURONTIN) 300 MG capsule Take 1 capsule (300 mg total) by mouth 3 (three) times daily. Patient taking differently: Take 300 mg by mouth daily as needed (pain). 07/01/21 11/15/24  Sater, Pearletha Furl, MD  Multiple Vitamin (MULTIVITAMIN WITH MINERALS) TABS tablet Take 1 tablet by mouth daily.    [provider]  pantoprazole (PROTONIX) 40 MG tablet Take 1 tablet (40 mg total) by mouth 2 (two) times daily before a meal. 11/20/23 01/19/24  Pokhrel, Rebekah Chesterfield, MD      Allergies    Banana, Other, Watermelon [citrullus vulgaris], and Epinephrine    Review of Systems   Review of Systems  Physical Exam Updated Vital Signs BP (!) 191/97 (BP Location:  Right Arm)   Pulse 69   Temp (!) 97.4 F (36.3 C) (Oral)   Resp 12   SpO2 100%  Physical Exam Vitals and nursing note reviewed.  Constitutional:      General: She is not in acute distress.    Appearance: She is well-developed.  HENT:     Head: Normocephalic and atraumatic.  Eyes:     Pupils: Pupils are equal, round, and reactive to light.  Cardiovascular:     Rate and Rhythm: Normal rate and regular rhythm.     Heart sounds: Normal heart sounds. No murmur heard.    No friction rub.  Pulmonary:     Effort: Pulmonary effort is normal.     Breath sounds: Normal breath sounds. No wheezing or rales.  Abdominal:     General:  Bowel sounds are normal. There is no distension.     Palpations: Abdomen is soft.     Tenderness: There is no abdominal tenderness. There is no guarding or rebound.  Musculoskeletal:        General: No tenderness. Normal range of motion.     Comments: No edema  Skin:    General: Skin is warm and dry.     Findings: No rash.  Neurological:     Mental Status: She is alert.     Cranial Nerves: No cranial nerve deficit.     Comments: Does not move her legs but does follow commands and is able to lift upper extremities without signs of any pronator drift or weakness.  Symmetric smile and eyebrow raise.  However patient is confused.  When asked a question she reports she has an answer but then never finishes with the answer.  No specific aphasia just more global confusion.  Oriented to person only     ED Results / Procedures / Treatments   Labs (all labs ordered are listed, but only abnormal results are displayed) Labs Reviewed  CBC WITH DIFFERENTIAL/PLATELET - Abnormal; Notable for the following components:      Result Value   WBC 3.3 (*)    Hemoglobin 11.8 (*)    MCV 76.8 (*)    MCH 23.6 (*)    RDW 27.3 (*)    Neutro Abs 1.4 (*)    All other components within normal limits  COMPREHENSIVE METABOLIC PANEL WITH GFR - Abnormal; Notable for the following components:   Calcium 10.5 (*)    Total Protein 8.3 (*)    All other components within normal limits  URINALYSIS, W/ REFLEX TO CULTURE (INFECTION SUSPECTED) - Abnormal; Notable for the following components:   APPearance CLOUDY (*)    Hgb urine dipstick LARGE (*)    Ketones, ur 15 (*)    Protein, ur 100 (*)    Nitrite POSITIVE (*)    Leukocytes,Ua LARGE (*)    Bacteria, UA MANY (*)    All other components within normal limits  I-STAT VENOUS BLOOD GAS, ED - Abnormal; Notable for the following components:   pCO2, Ven 43.0 (*)    HCT 35.0 (*)    Hemoglobin 11.9 (*)    All other components within normal limits  CULTURE, BLOOD (ROUTINE  X 2)  CULTURE, BLOOD (ROUTINE X 2)  AMMONIA  CBG MONITORING, ED    EKG EKG Interpretation Date/Time:  Friday December 24 2023 11:11:08 EDT Ventricular Rate:  67 PR Interval:  133 QRS Duration:  97 QT Interval:  433 QTC Calculation: 458 R Axis:   -6  Text Interpretation: Sinus  rhythm Atrial premature complex Nonspecific T abnormalities, inferior leads Confirmed by Gwyneth Sprout ([id number]) on 12/24/2023 11:46:38 AM  Radiology CT Head Wo Contrast Result Date: 12/24/2023 CLINICAL DATA:  Mental status change, altered mental status, history of multiple sclerosis. EXAM: CT HEAD WITHOUT CONTRAST TECHNIQUE: Contiguous axial images were obtained from the base of the skull through the vertex without intravenous contrast. RADIATION DOSE REDUCTION: This exam was performed according to the departmental dose-optimization program which includes automated exposure control, adjustment of the mA and/or kV according to patient size and/or use of iterative reconstruction technique. COMPARISON:  CT head 06/25/2018. FINDINGS: Brain: No acute intracranial hemorrhage. No CT evidence of acute infarct. Nonspecific hypoattenuation in the periventricular and subcortical white matter likely related to chronic microvascular ischemic changes versus changes in the setting of reported history of multiple sclerosis. No edema, mass effect, or midline shift. The basilar cisterns are patent. Ventricles: The ventricles are normal. Vascular: No hyperdense vessel or unexpected calcification. Skull: No acute or aggressive finding. Orbits: Similar radiopaque density along the anterior aspect of the left globe with sequelae of left lens replacement. Density may be postsurgical in nature versus posttraumatic. Orbits otherwise unremarkable. Sinuses: Mucosal thickening in the left frontal sinus and bilateral anterior ethmoid air cells. Air-fluid levels in the bilateral maxillary sinuses. Other: Mastoid air cells are clear. IMPRESSION: No CT  evidence of acute intracranial abnormality. Paranasal sinus disease as above with air-fluid levels in the bilateral maxillary sinuses. Recommend clinical correlation for acute sinusitis. Similar appearance of the left globe compared to 2019. Electronically Signed   By: Emily Filbert M.D.   On: 12/24/2023 13:42   DG Chest Port 1 View Result Date: 12/24/2023 CLINICAL DATA:  Altered mental status. EXAM: PORTABLE CHEST 1 VIEW COMPARISON:  November 15, 2023. FINDINGS: The heart size and mediastinal contours are within normal limits. Large hiatal hernia is noted. Both lungs are clear. The visualized skeletal structures are unremarkable. IMPRESSION: No active disease. Electronically Signed   By: Lupita Raider M.D.   On: 12/24/2023 13:22    Procedures Procedures    Medications Ordered in ED Medications  acetaminophen (TYLENOL) tablet 650 mg (650 mg Oral Given 12/24/23 1419)  cefTRIAXone (ROCEPHIN) 2 g in sodium chloride 0.9 % 100 mL IVPB (0 g Intravenous Stopped 12/24/23 1542)    ED Course/ Medical Decision Making/ A&P                                 Medical Decision Making Amount and/or Complexity of Data Reviewed Independent Historian: caregiver and spouse External Data Reviewed: notes. Labs: ordered. Decision-making details documented in ED Course. Radiology: ordered and independent interpretation performed. Decision-making details documented in ED Course. ECG/medicine tests: ordered and independent interpretation performed. Decision-making details documented in ED Course.  Risk OTC drugs. Decision regarding hospitalization.   Pt with multiple medical problems and comorbidities and presenting today with a complaint that caries a high risk for morbidity and mortality.  Patient presenting today with altered mental status.  Concern for stroke versus head bleed versus infectious etiology versus hypercarbia versus hyperammonemia versus reaction from recently restarting baclofen 2 days ago.   Currently patient is hemodynamically stable but is hypertensive here. I independently interpreted patient's labs and EKG.  EKG without acute findings, blood gas within normal limits, CBC with mild leukopenia of 3 point PE which is unchanged from prior hemoglobin improved from prior, CMP within normal limits and UA with large  hemoglobin, nitrite leukocyte positive with 6-10 epithelial cells and many bacteriuria.  Culture was sent blood cultures were done and patient was given a dose of Rocephin as this could be a UTI.I have independently visualized and interpreted pt's images today.  Head CT without acute findings.  Theology report paranasal sinus disease but no other acute changes and chest x-ray within normal limits.  Findings discussed with the family.  Given patient's new global confusion will admit for altered mental status.  Patient will benefit from an MRI with and without contrast to ensure no other acute process.  Also will hold baclofen.  Blood cultures were done patient was also given a dose of Rocephin.  Spoke with the hospitalist who is willing to admit at this time.          Final Clinical Impression(s) / ED Diagnoses Final diagnoses:  Altered mental status, unspecified altered mental status type  Urinary tract infection with hematuria, site unspecified    Rx / DC Orders ED Discharge Orders     None         Gwyneth Sprout, MD 12/24/23 1554

## 2023-12-24 NOTE — H&P (Signed)
 History and Physical    Heather Lutz YNW:[id number] DOB: 01-06-1946 DOA: 12/24/2023  PCP: Laqueta Due., MD   Patient coming from: Home   Chief Complaint: Confusion, somnolence   HPI: Heather Lutz is a 78 y.o. female with medical history significant for multiple sclerosis who is wheelchair bound at baseline and now presents with confusion and somnolence.   The patient's family became concerned that the patient seemed confused, more somnolent, slower to respond, and was repeating herself. This began yesterday and has worsened.   The patient has not made any specific complaints.   Gabapentin was reportedly changed to baclofen a couple days ago.    [facility] ED Course: Upon arrival to the ED, patient is found to be afebrile and saturating well on room air with normal HR and elevated BP. Labs are most notable for normal creatinine and WBC 3,300. There are no acute findings on CXR or head CT. There is bacteriuria, pyuria, and nitrites on UA.   Blood cultures were collected in the ED, patient was treated with acetaminophen and Rocephin, and she was transferred to [facility] for admission.   Review of Systems:  ROS limited by patient's clinical condition.  Past Medical History:  Diagnosis Date   Anemia    Blindness    Dysrhythmia    GERD (gastroesophageal reflux disease)    Glaucoma    Headache    History of kidney stones    Legally blind 2018   Multiple sclerosis (HCC)    Osteoporosis    Pelvic rash    Pneumonia     Past Surgical History:  Procedure Laterality Date   ABDOMINAL HYSTERECTOMY     CATARACT EXTRACTION Left    COLONOSCOPY N/A 11/19/2023   Procedure: COLONOSCOPY;  Surgeon: Napoleon Form, MD;  Location: [facility] ENDOSCOPY;  Service: Gastroenterology;  Laterality: N/A;   CYSTOSCOPY WITH RETROGRADE PYELOGRAM, URETEROSCOPY AND STENT PLACEMENT Left 08/01/2018   Procedure: CYSTOSCOPY WITH RETROGRADE PYELOGRAM, URETEROSCOPY, HOLMIUM LASER AND STENT  PLACEMENT;  Surgeon: Crista Elliot, MD;  Location: [facility] ORS;  Service: Urology;  Laterality: Left;   CYSTOSCOPY WITH RETROGRADE PYELOGRAM, URETEROSCOPY AND STENT PLACEMENT Bilateral 06/01/2022   Procedure: CYSTOSCOPY WITH BILATERAL RETROGRADE PYELOGRAM, RIGHT URETEROSCOPY WITH HOLMIUM LASER ,STONE BASKETRY AND STENT  PLACEMENT, LEFT DIAGNOSTIC URETEROSCOPY AND STENT PLACEMENT;  Surgeon: Crista Elliot, MD;  Location: [facility] ORS;  Service: Urology;  Laterality: Bilateral;   CYSTOSCOPY/URETEROSCOPY/HOLMIUM LASER/STENT PLACEMENT Bilateral 06/22/2022   Procedure: CYSTOSCOPY RIGHT URETERAL STENT REMOVAL LEFT URETEROSCOPY/HOLMIUM LASER/STENT PLACEMENT;  Surgeon: Crista Elliot, MD;  Location: [facility] ORS;  Service: Urology;  Laterality: Bilateral;   ESOPHAGOGASTRODUODENOSCOPY N/A 11/06/2012   Procedure: ESOPHAGOGASTRODUODENOSCOPY (EGD);  Surgeon: Shirley Friar, MD;  Location: [facility] ENDOSCOPY;  Service: Endoscopy;  Laterality: N/A;   ESOPHAGOGASTRODUODENOSCOPY N/A 11/19/2023   Procedure: EGD (ESOPHAGOGASTRODUODENOSCOPY);  Surgeon: Napoleon Form, MD;  Location: [facility] ENDOSCOPY;  Service: Gastroenterology;  Laterality: N/A;   KIDNEY STONE SURGERY     TRANSURETHRAL RESECTION OF BLADDER TUMOR N/A 06/01/2022   Procedure: BLADDER BIOPSY  ;  Surgeon: Crista Elliot, MD;  Location: [facility] ORS;  Service: Urology;  Laterality: N/A;  90 MINS FOR CASE    Social History:   reports that she has never smoked. She has never used smokeless tobacco. She reports that she does not drink alcohol and does not use drugs.  Allergies  Allergen Reactions   Banana Itching   Other Itching    Walnuts, honeydew, canteloupe,  Watermelon [Citrullus Vulgaris] Itching   Epinephrine Anxiety and Other (See Comments)    Hallucinations     Family History  Problem Relation Age of Onset   CAD Father    Heart attack Neg Hx      Prior to Admission medications   Medication Sig Start Date End Date Taking? Authorizing Provider   acetaminophen (TYLENOL) 500 MG tablet Take 1,000 mg by mouth every 6 (six) hours as needed for mild pain.    [provider]  b complex vitamins capsule Take 1 capsule by mouth daily.    [provider]  baclofen (LIORESAL) 10 MG tablet Take 1 tablet (10 mg total) by mouth 3 (three) times daily as needed for muscle spasms. 09/22/17   Benjiman Core, MD  carboxymethylcellulose (REFRESH PLUS) 0.5 % SOLN Place 2 drops into both eyes 2 (two) times daily as needed (dry eyes).    [provider]  Cholecalciferol (VITAMIN D3 PO) Take 1 tablet by mouth daily.    [provider]  ELDERBERRY PO Take 1 tablet by mouth daily.    [provider]  ferrous sulfate 325 (65 FE) MG tablet Take 325 mg by mouth daily with breakfast.    [provider]  FIBER ADULT GUMMIES PO Take 1 tablet by mouth daily.    [provider]  gabapentin (NEURONTIN) 300 MG capsule Take 1 capsule (300 mg total) by mouth 3 (three) times daily. Patient taking differently: Take 300 mg by mouth daily as needed (pain). 07/01/21 11/15/24  Sater, Pearletha Furl, MD  Multiple Vitamin (MULTIVITAMIN WITH MINERALS) TABS tablet Take 1 tablet by mouth daily.    [provider]  pantoprazole (PROTONIX) 40 MG tablet Take 1 tablet (40 mg total) by mouth 2 (two) times daily before a meal. 11/20/23 01/19/24  Joycelyn Das, MD    Physical Exam: Vitals:   12/24/23 1330 12/24/23 1459 12/24/23 1600 12/24/23 1701  BP: (!) 158/73 (!) 191/97 (!) 166/70 (!) 161/68  Pulse: 64 69 64 70  Resp: 12 12 13 17   Temp:  (!) 97.4 F (36.3 C) (!) 97.5 F (36.4 C) (!) 97.4 F (36.3 C)  TempSrc:  Oral Oral   SpO2: 100% 100% 100% 100%    Constitutional: NAD, no pallor or diaphoresis   Eyes: PERTLA, lids and conjunctivae normal ENMT: Mucous membranes are moist. Posterior pharynx clear of any exudate or lesions.   Neck: supple, no masses  Respiratory: no wheezing, no crackles. No accessory muscle use.   Cardiovascular: S1 & S2 heard, regular rate and rhythm. No extremity edema.  Abdomen: Soft, no tenderness. Bowel sounds active.  Musculoskeletal: no clubbing / cyanosis. No joint deformity upper and lower extremities.   Skin: no significant rashes, lesions, ulcers. Warm, dry, well-perfused. Neurologic: No gross facial asymmetry. Moving all extremities. Sleeping. Wakes to voice and makes brief eye-contact before returning to sleep.    Psychiatric: Calm. Cooperative.    Labs and Imaging on Admission: I have personally reviewed following labs and imaging studies  CBC: Recent Labs  Lab 12/24/23 1153 12/24/23 1242  WBC 3.3*  --   NEUTROABS 1.4*  --   HGB 11.8* 11.9*  HCT 38.4 35.0*  MCV 76.8*  --   PLT 174  --    Basic Metabolic Panel: Recent Labs  Lab 12/24/23 1153 12/24/23 1242  NA 142 143  K 3.8 3.7  CL 104  --   CO2 26  --   GLUCOSE 89  --   BUN  19  --   CREATININE 0.78  --   CALCIUM 10.5*  --    GFR: CrCl cannot be calculated (Unknown ideal weight.). Liver Function Tests: Recent Labs  Lab 12/24/23 1153  AST 33  ALT 20  ALKPHOS 119  BILITOT 0.5  PROT 8.3*  ALBUMIN 3.8   No results for input(s): "LIPASE", "AMYLASE" in the last 168 hours. Recent Labs  Lab 12/24/23 1153  AMMONIA <10   Coagulation Profile: No results for input(s): "INR", "PROTIME" in the last 168 hours. Cardiac Enzymes: No results for input(s): "CKTOTAL", "CKMB", "CKMBINDEX", "TROPONINI" in the last 168 hours. BNP (last 3 results) No results for input(s): "PROBNP" in the last 8760 hours. HbA1C: No results for input(s): "HGBA1C" in the last 72 hours. CBG: Recent Labs  Lab 12/24/23 1115  GLUCAP 89   Lipid Profile: No results for input(s): "CHOL", "HDL", "LDLCALC", "TRIG", "CHOLHDL", "LDLDIRECT" in the last 72 hours. Thyroid Function Tests: No results for input(s): "TSH", "T4TOTAL", "FREET4", "T3FREE", "THYROIDAB" in the last 72 hours. Anemia Panel: No results for input(s):  "VITAMINB12", "FOLATE", "FERRITIN", "TIBC", "IRON", "RETICCTPCT" in the last 72 hours. Urine analysis:    Component Value Date/Time   COLORURINE YELLOW 12/24/2023 1153   APPEARANCEUR CLOUDY (A) 12/24/2023 1153   LABSPEC >=1.030 12/24/2023 1153   PHURINE 6.0 12/24/2023 1153   GLUCOSEU NEGATIVE 12/24/2023 1153   HGBUR LARGE (A) 12/24/2023 1153   BILIRUBINUR NEGATIVE 12/24/2023 1153   KETONESUR 15 (A) 12/24/2023 1153   PROTEINUR 100 (A) 12/24/2023 1153   UROBILINOGEN 0.2 11/17/2014 1700   NITRITE POSITIVE (A) 12/24/2023 1153   LEUKOCYTESUR LARGE (A) 12/24/2023 1153   Sepsis Labs: @LABRCNTIP (procalcitonin:4,lacticidven:4) )No results found for this or any previous visit (from the past 240 hours).   Radiological Exams on Admission: CT Head Wo Contrast Result Date: 12/24/2023 CLINICAL DATA:  Mental status change, altered mental status, history of multiple sclerosis. EXAM: CT HEAD WITHOUT CONTRAST TECHNIQUE: Contiguous axial images were obtained from the base of the skull through the vertex without intravenous contrast. RADIATION DOSE REDUCTION: This exam was performed according to the departmental dose-optimization program which includes automated exposure control, adjustment of the mA and/or kV according to patient size and/or use of iterative reconstruction technique. COMPARISON:  CT head 06/25/2018. FINDINGS: Brain: No acute intracranial hemorrhage. No CT evidence of acute infarct. Nonspecific hypoattenuation in the periventricular and subcortical white matter likely related to chronic microvascular ischemic changes versus changes in the setting of reported history of multiple sclerosis. No edema, mass effect, or midline shift. The basilar cisterns are patent. Ventricles: The ventricles are normal. Vascular: No hyperdense vessel or unexpected calcification. Skull: No acute or aggressive finding. Orbits: Similar radiopaque density along the anterior aspect of the left globe with sequelae of left  lens replacement. Density may be postsurgical in nature versus posttraumatic. Orbits otherwise unremarkable. Sinuses: Mucosal thickening in the left frontal sinus and bilateral anterior ethmoid air cells. Air-fluid levels in the bilateral maxillary sinuses. Other: Mastoid air cells are clear. IMPRESSION: No CT evidence of acute intracranial abnormality. Paranasal sinus disease as above with air-fluid levels in the bilateral maxillary sinuses. Recommend clinical correlation for acute sinusitis. Similar appearance of the left globe compared to 2019. Electronically Signed   By: Emily Filbert M.D.   On: 12/24/2023 13:42   DG Chest Port 1 View Result Date: 12/24/2023 CLINICAL DATA:  Altered mental status. EXAM: PORTABLE CHEST 1 VIEW COMPARISON:  November 15, 2023. FINDINGS: The heart size and mediastinal contours are within normal limits.  Large hiatal hernia is noted. Both lungs are clear. The visualized skeletal structures are unremarkable. IMPRESSION: No active disease. Electronically Signed   By: Lupita Raider M.D.   On: 12/24/2023 13:22    EKG: Independently reviewed. Sinus rhythm.   Assessment/Plan   1. Acute encephalopathy  - No acute findings on head CT, suspected secondary to UTI and/or baclofen  - Hold baclofen, treat UTI, use delirium precautions, and expand workup if fails to improve as expected    2. UTI  - Continue Rocephin, follow cultures and clinical course    3. MS  - Supportive care, hold baclofen for now     DVT prophylaxis: Lovenox  Code Status: Full  Level of Care: Level of care: Med-Surg Family Communication: Family updated from ED   Disposition Plan:  Patient is from: Home  Anticipated d/c is to: TBD Anticipated d/c date is: 4/12 or 12/26/23  Patient currently: Pending improved mental status  Consults called: None  Admission status: Observation     Briscoe Deutscher, MD [facility]  12/24/2023, 5:18 PM

## 2023-12-24 NOTE — ED Triage Notes (Addendum)
 AMS since yesterday, hx of MS

## 2023-12-24 NOTE — ED Notes (Signed)
 ED Provider at bedside.

## 2023-12-24 NOTE — ED Notes (Signed)
 ED TO INPATIENT HANDOFF REPORT  ED Nurse Name and Phone #: Mohd Clemons RN  [phone]  S Name/Age/Gender Heather Lutz 78 y.o. female Room/Bed: MH07/MH07  Code Status   Code Status: Prior  Home/SNF/Other Home   Triage Complete: Triage complete  Chief Complaint Acute metabolic encephalopathy [G93.41]  Triage Note AMS since yesterday, hx of MS   Allergies Allergies  Allergen Reactions   Banana Itching   Other Itching    Walnuts, honeydew, canteloupe,   Watermelon [Citrullus Vulgaris] Itching   Epinephrine Anxiety and Other (See Comments)    Hallucinations     Level of Care/Admitting Diagnosis ED Disposition     ED Disposition  Admit   Condition  --   Comment  Hospital Area: [facility] [[id number]]  Level of Care: Med-Surg [16]  Interfacility transfer: Yes  May place patient in observation at [facility] or [facility] if equivalent level of care is available:: No  Covid Evaluation: Asymptomatic - no recent exposure (last 10 days) testing not required  Diagnosis: Acute metabolic encephalopathy [1478295]  Admitting Physician: Arlean Hopping [[id number]]  Attending Physician: Arlean Hopping [[id number]]          B Medical/Surgery History Past Medical History:  Diagnosis Date   Anemia    Blindness    Dysrhythmia    GERD (gastroesophageal reflux disease)    Glaucoma    Headache    History of kidney stones    Legally blind 2018   Multiple sclerosis (HCC)    Osteoporosis    Pelvic rash    Pneumonia    Past Surgical History:  Procedure Laterality Date   ABDOMINAL HYSTERECTOMY     CATARACT EXTRACTION Left    COLONOSCOPY N/A 11/19/2023   Procedure: COLONOSCOPY;  Surgeon: Napoleon Form, MD;  Location: [facility];  Service: Gastroenterology;  Laterality: N/A;   CYSTOSCOPY WITH RETROGRADE PYELOGRAM, URETEROSCOPY AND STENT PLACEMENT Left 08/01/2018   Procedure: CYSTOSCOPY WITH RETROGRADE PYELOGRAM, URETEROSCOPY, HOLMIUM LASER AND  STENT PLACEMENT;  Surgeon: Crista Elliot, MD;  Location: [facility];  Service: Urology;  Laterality: Left;   CYSTOSCOPY WITH RETROGRADE PYELOGRAM, URETEROSCOPY AND STENT PLACEMENT Bilateral 06/01/2022   Procedure: CYSTOSCOPY WITH BILATERAL RETROGRADE PYELOGRAM, RIGHT URETEROSCOPY WITH HOLMIUM LASER ,STONE BASKETRY AND STENT  PLACEMENT, LEFT DIAGNOSTIC URETEROSCOPY AND STENT PLACEMENT;  Surgeon: Crista Elliot, MD;  Location: [facility];  Service: Urology;  Laterality: Bilateral;   CYSTOSCOPY/URETEROSCOPY/HOLMIUM LASER/STENT PLACEMENT Bilateral 06/22/2022   Procedure: CYSTOSCOPY RIGHT URETERAL STENT REMOVAL LEFT URETEROSCOPY/HOLMIUM LASER/STENT PLACEMENT;  Surgeon: Crista Elliot, MD;  Location: [facility];  Service: Urology;  Laterality: Bilateral;   ESOPHAGOGASTRODUODENOSCOPY N/A 11/06/2012   Procedure: ESOPHAGOGASTRODUODENOSCOPY (EGD);  Surgeon: Shirley Friar, MD;  Location: [facility];  Service: Endoscopy;  Laterality: N/A;   ESOPHAGOGASTRODUODENOSCOPY N/A 11/19/2023   Procedure: EGD (ESOPHAGOGASTRODUODENOSCOPY);  Surgeon: Napoleon Form, MD;  Location: [facility] ENDOSCOPY;  Service: Gastroenterology;  Laterality: N/A;   KIDNEY STONE SURGERY     TRANSURETHRAL RESECTION OF BLADDER TUMOR N/A 06/01/2022   Procedure: BLADDER BIOPSY  ;  Surgeon: Crista Elliot, MD;  Location: [facility];  Service: Urology;  Laterality: N/A;  90 MINS FOR CASE     A IV Location/Drains/Wounds Patient Lines/Drains/Airways Status     Active Line/Drains/Airways     Name Placement date Placement time Site Days   Peripheral IV 12/24/23 18 G 1.88" Left Antecubital 12/24/23  1155  Antecubital  less than 1   Ureteral Drain/Stent Left ureter 6  Fr. 06/22/22  0829  Left ureter  550            Intake/Output Last 24 hours  Intake/Output Summary (Last 24 hours) at 12/24/2023 1553 Last data filed at 12/24/2023 1542 Gross per 24 hour  Intake 100 ml  Output --  Net 100 ml    Labs/Imaging Results for orders placed  or performed during the hospital encounter of 12/24/23 (from the past 48 hours)  CBG monitoring, ED     Status: None   Collection Time: 12/24/23 11:15 AM  Result Value Ref Range   Glucose-Capillary 89 70 - 99 mg/dL    Comment: Glucose reference range applies only to samples taken after fasting for at least 8 hours.  CBC with Differential/Platelet     Status: Abnormal   Collection Time: 12/24/23 11:53 AM  Result Value Ref Range   WBC 3.3 (L) 4.0 - 10.5 K/uL   RBC 5.00 3.87 - 5.11 MIL/uL   Hemoglobin 11.8 (L) 12.0 - 15.0 g/dL   HCT 16.1 09.6 - 04.5 %   MCV 76.8 (L) 80.0 - 100.0 fL   MCH 23.6 (L) 26.0 - 34.0 pg   MCHC 30.7 30.0 - 36.0 g/dL   RDW 40.9 (H) 81.1 - 91.4 %   Platelets 174 150 - 400 K/uL   nRBC 0.0 0.0 - 0.2 %   Neutrophils Relative % 41 %   Neutro Abs 1.4 (L) 1.7 - 7.7 K/uL   Lymphocytes Relative 44 %   Lymphs Abs 1.5 0.7 - 4.0 K/uL   Monocytes Relative 12 %   Monocytes Absolute 0.4 0.1 - 1.0 K/uL   Eosinophils Relative 2 %   Eosinophils Absolute 0.1 0.0 - 0.5 K/uL   Basophils Relative 1 %   Basophils Absolute 0.0 0.0 - 0.1 K/uL   WBC Morphology MORPHOLOGY UNREMARKABLE    RBC Morphology MORPHOLOGY UNREMARKABLE    Smear Review Normal platelet morphology    Immature Granulocytes 0 %   Abs Immature Granulocytes 0.00 0.00 - 0.07 K/uL    Comment: Performed at [facility], [address] [place], [place] [address]  Comprehensive metabolic panel with GFR     Status: Abnormal   Collection Time: 12/24/23 11:53 AM  Result Value Ref Range   Sodium 142 135 - 145 mmol/L   Potassium 3.8 3.5 - 5.1 mmol/L   Chloride 104 98 - 111 mmol/L   CO2 26 22 - 32 mmol/L   Glucose, Bld 89 70 - 99 mg/dL    Comment: Glucose reference range applies only to samples taken after fasting for at least 8 hours.   BUN 19 8 - 23 mg/dL   Creatinine, Ser 6.21 0.44 - 1.00 mg/dL   Calcium 30.8 (H) 8.9 - 10.3 mg/dL   Total Protein 8.3 (H) 6.5 - 8.1 g/dL   Albumin 3.8 3.5 - 5.0 g/dL   AST  33 15 - 41 U/L   ALT 20 0 - 44 U/L   Alkaline Phosphatase 119 38 - 126 U/L   Total Bilirubin 0.5 0.0 - 1.2 mg/dL   GFR, Estimated >65 >78 mL/min    Comment: (NOTE) Calculated using the CKD-EPI Creatinine Equation (2021)    Anion gap 12 5 - 15    Comment: Performed at [facility], [address] [place], [place] [address]  Ammonia     Status: None   Collection Time: 12/24/23 11:53 AM  Result Value Ref Range   Ammonia <10 9 -  35 umol/L    Comment: Performed at [facility], [address] [place], [place] [address]  Urinalysis, w/ Reflex to Culture (Infection Suspected) -Urine, Clean Catch     Status: Abnormal   Collection Time: 12/24/23 11:53 AM  Result Value Ref Range   Specimen Source URINE, CLEAN CATCH    Color, Urine YELLOW YELLOW   APPearance CLOUDY (A) CLEAR   Specific Gravity, Urine >=1.030 1.005 - 1.030   pH 6.0 5.0 - 8.0   Glucose, UA NEGATIVE NEGATIVE mg/dL   Hgb urine dipstick LARGE (A) NEGATIVE   Bilirubin Urine NEGATIVE NEGATIVE   Ketones, ur 15 (A) NEGATIVE mg/dL   Protein, ur 604 (A) NEGATIVE mg/dL   Nitrite POSITIVE (A) NEGATIVE   Leukocytes,Ua LARGE (A) NEGATIVE   Squamous Epithelial / HPF 6-10 0 - 5 /HPF   WBC, UA >50 0 - 5 WBC/hpf    Comment: Reflex urine culture not performed if WBC <=10, OR if Squamous epithelial cells >5. If Squamous epithelial cells >5, suggest recollection.   RBC / HPF >50 0 - 5 RBC/hpf   Bacteria, UA MANY (A) NONE SEEN    Comment: Performed at [facility], [address] [place], [place] [address]  I-Stat venous blood gas, ED     Status: Abnormal   Collection Time: 12/24/23 12:42 PM  Result Value Ref Range   pH, Ven 7.389 7.25 - 7.43   pCO2, Ven 43.0 (L) 44 - 60 mmHg   pO2, Ven 32 32 - 45 mmHg   Bicarbonate 26.0 20.0 - 28.0 mmol/L   TCO2 27 22 - 32 mmol/L   O2 Saturation 61 %   Acid-Base Excess 1.0 0.0 - 2.0 mmol/L   Sodium 143 135 - 145 mmol/L   Potassium 3.7 3.5 - 5.1 mmol/L   Calcium, Ion  1.27 1.15 - 1.40 mmol/L   HCT 35.0 (L) 36.0 - 46.0 %   Hemoglobin 11.9 (L) 12.0 - 15.0 g/dL   Collection site IV start    Drawn by Operator    Sample type VENOUS    Comment VALUES EXPECTED, NO REPEAT    CT Head Wo Contrast Result Date: 12/24/2023 CLINICAL DATA:  Mental status change, altered mental status, history of multiple sclerosis. EXAM: CT HEAD WITHOUT CONTRAST TECHNIQUE: Contiguous axial images were obtained from the base of the skull through the vertex without intravenous contrast. RADIATION DOSE REDUCTION: This exam was performed according to the departmental dose-optimization program which includes automated exposure control, adjustment of the mA and/or kV according to patient size and/or use of iterative reconstruction technique. COMPARISON:  CT head 06/25/2018. FINDINGS: Brain: No acute intracranial hemorrhage. No CT evidence of acute infarct. Nonspecific hypoattenuation in the periventricular and subcortical white matter likely related to chronic microvascular ischemic changes versus changes in the setting of reported history of multiple sclerosis. No edema, mass effect, or midline shift. The basilar cisterns are patent. Ventricles: The ventricles are normal. Vascular: No hyperdense vessel or unexpected calcification. Skull: No acute or aggressive finding. Orbits: Similar radiopaque density along the anterior aspect of the left globe with sequelae of left lens replacement. Density may be postsurgical in nature versus posttraumatic. Orbits otherwise unremarkable. Sinuses: Mucosal thickening in the left frontal sinus and bilateral anterior ethmoid air cells. Air-fluid levels in the bilateral maxillary sinuses. Other: Mastoid air cells are clear. IMPRESSION: No CT evidence of acute intracranial abnormality. Paranasal sinus disease as above with air-fluid levels in the bilateral maxillary sinuses. Recommend clinical  correlation for acute sinusitis. Similar appearance of the left globe compared to  2019. Electronically Signed   By: Emily Filbert M.D.   On: 12/24/2023 13:42   DG Chest Port 1 View Result Date: 12/24/2023 CLINICAL DATA:  Altered mental status. EXAM: PORTABLE CHEST 1 VIEW COMPARISON:  November 15, 2023. FINDINGS: The heart size and mediastinal contours are within normal limits. Large hiatal hernia is noted. Both lungs are clear. The visualized skeletal structures are unremarkable. IMPRESSION: No active disease. Electronically Signed   By: Lupita Raider M.D.   On: 12/24/2023 13:22    Pending Labs Unresulted Labs (From admission, onward)     Start     Ordered   12/24/23 1430  Blood culture (routine x 2)  BLOOD CULTURE X 2,   STAT      12/24/23 1429            Vitals/Pain Today's Vitals   12/24/23 1116 12/24/23 1330 12/24/23 1459 12/24/23 1503  BP:  (!) 158/73 (!) 191/97   Pulse: 67 64 69   Resp: 15 12 12    Temp:   (!) 97.4 F (36.3 C)   TempSrc:   Oral   SpO2: 100% 100% 100%   PainSc:    0-No pain    Isolation Precautions No active isolations  Medications Medications  acetaminophen (TYLENOL) tablet 650 mg (650 mg Oral Given 12/24/23 1419)  cefTRIAXone (ROCEPHIN) 2 g in sodium chloride 0.9 % 100 mL IVPB (0 g Intravenous Stopped 12/24/23 1542)         R Recommendations: See Admitting Provider Note  Report given to:

## 2023-12-25 ENCOUNTER — Other Ambulatory Visit: Payer: Self-pay

## 2023-12-25 DIAGNOSIS — Z960 Presence of urogenital implants: Secondary | ICD-10-CM | POA: Diagnosis present

## 2023-12-25 DIAGNOSIS — M810 Age-related osteoporosis without current pathological fracture: Secondary | ICD-10-CM | POA: Diagnosis present

## 2023-12-25 DIAGNOSIS — Z79899 Other long term (current) drug therapy: Secondary | ICD-10-CM | POA: Diagnosis not present

## 2023-12-25 DIAGNOSIS — D649 Anemia, unspecified: Secondary | ICD-10-CM | POA: Diagnosis present

## 2023-12-25 DIAGNOSIS — Z90710 Acquired absence of both cervix and uterus: Secondary | ICD-10-CM | POA: Diagnosis not present

## 2023-12-25 DIAGNOSIS — Z993 Dependence on wheelchair: Secondary | ICD-10-CM | POA: Diagnosis not present

## 2023-12-25 DIAGNOSIS — G9341 Metabolic encephalopathy: Secondary | ICD-10-CM | POA: Diagnosis present

## 2023-12-25 DIAGNOSIS — K219 Gastro-esophageal reflux disease without esophagitis: Secondary | ICD-10-CM | POA: Diagnosis present

## 2023-12-25 DIAGNOSIS — Z8249 Family history of ischemic heart disease and other diseases of the circulatory system: Secondary | ICD-10-CM | POA: Diagnosis not present

## 2023-12-25 DIAGNOSIS — G35 Multiple sclerosis: Secondary | ICD-10-CM | POA: Diagnosis present

## 2023-12-25 DIAGNOSIS — Z87442 Personal history of urinary calculi: Secondary | ICD-10-CM | POA: Diagnosis not present

## 2023-12-25 DIAGNOSIS — H548 Legal blindness, as defined in USA: Secondary | ICD-10-CM | POA: Diagnosis present

## 2023-12-25 DIAGNOSIS — Z91018 Allergy to other foods: Secondary | ICD-10-CM | POA: Diagnosis not present

## 2023-12-25 DIAGNOSIS — T428X5A Adverse effect of antiparkinsonism drugs and other central muscle-tone depressants, initial encounter: Secondary | ICD-10-CM | POA: Diagnosis present

## 2023-12-25 DIAGNOSIS — N390 Urinary tract infection, site not specified: Secondary | ICD-10-CM | POA: Diagnosis present

## 2023-12-25 LAB — BASIC METABOLIC PANEL WITH GFR
Anion gap: 10 (ref 5–15)
BUN: 18 mg/dL (ref 8–23)
CO2: 24 mmol/L (ref 22–32)
Calcium: 9.8 mg/dL (ref 8.9–10.3)
Chloride: 105 mmol/L (ref 98–111)
Creatinine, Ser: 0.71 mg/dL (ref 0.44–1.00)
GFR, Estimated: >60 mL/min (ref >60)
Glucose, Bld: 70 mg/dL (ref 70–99)
Potassium: 4 mmol/L (ref 3.5–5.1)
Sodium: 139 mmol/L (ref 135–145)

## 2023-12-25 LAB — CBC
HCT: 35.9 % — ABNORMAL LOW (ref 36.0–46.0)
Hemoglobin: 10.9 g/dL — ABNORMAL LOW (ref 12.0–15.0)
MCH: 23.8 pg — ABNORMAL LOW (ref 26.0–34.0)
MCHC: 30.4 g/dL (ref 30.0–36.0)
MCV: 78.4 fL — ABNORMAL LOW (ref 80.0–100.0)
Platelets: 174 10*3/uL (ref 150–400)
RBC: 4.58 MIL/uL (ref 3.87–5.11)
RDW: 27.7 % — ABNORMAL HIGH (ref 11.5–15.5)
WBC: 4.2 10*3/uL (ref 4.0–10.5)
nRBC: 0 % (ref 0.0–0.2)

## 2023-12-25 NOTE — Plan of Care (Signed)

## 2023-12-25 NOTE — TOC Progression Note (Signed)
 Transition of Care [facility]) - Progression Note    Patient Details  Name: Heather Lutz MRN: [id number] Date of Birth: [birth year]  Transition of Care [facility]) CM/SW Contact  Katrine Parody, [place] Phone Number: 12/25/2023, 1:14 PM  Clinical Narrative:     CSW spoke with pt at bedside to deliver MOON , pt was very pleasant and indicated verbally that she understood.  Pt is Blind and unable to sign.  CSW drew line through signature line and agreed to place hard copy of the document in patients chart. TOC to follow.     Barriers to Discharge: Continued Medical Work up  Expected Discharge Plan and Services                                               Social Determinants of Health (SDOH) Interventions SDOH Screenings   Food Insecurity: No Food Insecurity (12/24/2023)  Housing: Low Risk  (12/24/2023)  Transportation Needs: No Transportation Needs (12/24/2023)  Utilities: Not At Risk (12/24/2023)  Social Connections: Moderately Isolated (12/24/2023)  Tobacco Use: Low Risk  (12/24/2023)    Readmission Risk Interventions     No data to display

## 2023-12-25 NOTE — Progress Notes (Signed)
   12/25/23 1554  TOC Brief Assessment  Insurance and Status Reviewed  Home environment has been reviewed From home, spouse  Prior level of function: Need Assistance  Prior/Current Home Services Current home services (Visually Impaired)  Social Drivers of Health Review SDOH reviewed no interventions necessary  Readmission risk has been reviewed Yes  Transition of care needs no transition of care needs at this time

## 2023-12-25 NOTE — Progress Notes (Signed)
 PROGRESS NOTE    Heather Lutz  FAO:[id number] DOB: 1946/04/18 DOA: 12/24/2023 PCP: Naoma Bacca., MD  Brief Narrative:  This 78 years old female with PMH significant for multiple sclerosis who is wheelchair-bound at baseline and now presented with confusion and somnolence. Patient's family became concerned that patient seems confused , more somnolent and slower to respond and was repeating herself.  This began yesterday and has been worsening.  Patient has not made any specific complaints.  Daughter reports patient's gabapentin was recently changed to baclofen a couple of days ago. In the ED patient was found to be hemodynamically stable but found to have UTI. Blood cultures were obtained.  Patient started on empiric ceftriaxone.  Admitted for further evaluation.   Assessment & Plan:   Principal Problem:   Acute metabolic encephalopathy Active Problems:   Multiple sclerosis (HCC)   Acute lower UTI  Acute metabolic encephalopathy: > Multifactorial Patient presented with somnolence, confusion, slow to respond and repeating herself. Could be multifactorial due to UTI and baclofen use. Her gabapentin was recently switched to baclofen 2 days ago. CT head without any acute findings. Hold baclofen for now, Continue IV ceftriaxone. Use delirium precautions. Expand workup if fails to improve as expected. Patient seems much improved and back to normal.  Urinary tract infection Continue ceftriaxone, follow-up blood and urine cultures.   Follow-up clinical course  Multiple sclerosis: Continue supportive care Hold baclofen for now. PT and OT evaluation.   DVT prophylaxis: Lovenox Code Status: Full code Family Communication: No family at bedside. Disposition Plan:    Status is: Observation The patient remains OBS appropriate and will d/c before 2 midnights.   Admitted for acute metabolic encephalopathy likely multifactorial due to UTI and baclofen use.  Consultants:   None  Procedures: CT head. Antimicrobials: Ceftriaxone  Subjective: Patient was seen and examined at bedside. Overnight events noted. Patient appears back to her baseline. She is fully awake and oriented and following commands. She denies any urinary symptoms.  Objective: Vitals:   12/24/23 1701 12/24/23 2108 12/25/23 0100 12/25/23 0507  BP: (!) 161/68 129/61 138/65 (!) 162/83  Pulse: 70 84 88 95  Resp: 17 18 16 16   Temp: (!) 97.4 F (36.3 C) 97.8 F (36.6 C) 97.6 F (36.4 C) 98.4 F (36.9 C)  TempSrc:  Oral    SpO2: 100% 100% 99% 99%    Intake/Output Summary (Last 24 hours) at 12/25/2023 1055 Last data filed at 12/25/2023 0641 Gross per 24 hour  Intake 220 ml  Output 50 ml  Net 170 ml   There were no vitals filed for this visit.  Examination:  General exam: Appears calm and comfortable, deconditioned, not in any acute distress. Respiratory system: Clear to auscultation. Respiratory effort normal.  RR 16 Cardiovascular system: S1 & S2 heard, RRR. No JVD, murmurs, rubs, gallops or clicks.  Gastrointestinal system: Abdomen is non distended, soft and non tender.  Normal bowel sounds heard. Central nervous system: Alert and oriented X 3. No focal neurological deficits. Extremities: No edema, no cyanosis, no clubbing Skin: No rashes, lesions or ulcers Psychiatry: Judgement and insight appear normal. Mood & affect appropriate.   Data Reviewed: I have personally reviewed following labs and imaging studies  CBC: Recent Labs  Lab 12/24/23 1153 12/24/23 1242 12/25/23 0614  WBC 3.3*  --  4.2  NEUTROABS 1.4*  --   --   HGB 11.8* 11.9* 10.9*  HCT 38.4 35.0* 35.9*  MCV 76.8*  --  78.4*  PLT  174  --  174   Basic Metabolic Panel: Recent Labs  Lab 12/24/23 1153 12/24/23 1242 12/25/23 0614  NA 142 143 139  K 3.8 3.7 4.0  CL 104  --  105  CO2 26  --  24  GLUCOSE 89  --  70  BUN 19  --  18  CREATININE 0.78  --  0.71  CALCIUM 10.5*  --  9.8   GFR: CrCl cannot be  calculated (Unknown ideal weight.). Liver Function Tests: Recent Labs  Lab 12/24/23 1153  AST 33  ALT 20  ALKPHOS 119  BILITOT 0.5  PROT 8.3*  ALBUMIN 3.8   No results for input(s): "LIPASE", "AMYLASE" in the last 168 hours. Recent Labs  Lab 12/24/23 1153  AMMONIA <10   Coagulation Profile: No results for input(s): "INR", "PROTIME" in the last 168 hours. Cardiac Enzymes: No results for input(s): "CKTOTAL", "CKMB", "CKMBINDEX", "TROPONINI" in the last 168 hours. BNP (last 3 results) No results for input(s): "PROBNP" in the last 8760 hours. HbA1C: No results for input(s): "HGBA1C" in the last 72 hours. CBG: Recent Labs  Lab 12/24/23 1115  GLUCAP 89   Lipid Profile: No results for input(s): "CHOL", "HDL", "LDLCALC", "TRIG", "CHOLHDL", "LDLDIRECT" in the last 72 hours. Thyroid Function Tests: No results for input(s): "TSH", "T4TOTAL", "FREET4", "T3FREE", "THYROIDAB" in the last 72 hours. Anemia Panel: No results for input(s): "VITAMINB12", "FOLATE", "FERRITIN", "TIBC", "IRON", "RETICCTPCT" in the last 72 hours. Sepsis Labs: No results for input(s): "PROCALCITON", "LATICACIDVEN" in the last 168 hours.  Recent Results (from the past 240 hours)  Blood culture (routine x 2)     Status: None (Preliminary result)   Collection Time: 12/24/23  2:30 PM   Specimen: BLOOD  Result Value Ref Range Status   Specimen Description   Final    BLOOD LEFT ANTECUBITAL Performed at [facility], [address] [place], [place] [address]    Special Requests   Final    BOTTLES DRAWN AEROBIC AND ANAEROBIC Blood Culture results may not be optimal due to an inadequate volume of blood received in culture bottles Performed at [facility], [address] [place], [place] [address]    Culture   Final    NO GROWTH < 12 HOURS Performed at [facility], [address], [place], [place] [address]    Report Status PENDING  Incomplete  Blood culture (routine x 2)      Status: None (Preliminary result)   Collection Time: 12/24/23  2:39 PM   Specimen: BLOOD  Result Value Ref Range Status   Specimen Description   Final    BLOOD RIGHT ANTECUBITAL Performed at [facility], [address] [place], [place] [address]    Special Requests   Final    BOTTLES DRAWN AEROBIC AND ANAEROBIC Blood Culture results may not be optimal due to an inadequate volume of blood received in culture bottles Performed at [facility], [address] [place], [place] [address]    Culture   Final    NO GROWTH < 12 HOURS Performed at [facility], [address], [place], [place] [address]    Report Status PENDING  Incomplete    Radiology Studies: CT Head Wo Contrast Result Date: 12/24/2023 CLINICAL DATA:  Mental status change, altered mental status, history of multiple sclerosis. EXAM: CT HEAD WITHOUT CONTRAST TECHNIQUE: Contiguous axial images were obtained from the base of the skull  through the vertex without intravenous contrast. RADIATION DOSE REDUCTION: This exam was performed according to the departmental dose-optimization program which includes automated exposure control, adjustment of the mA and/or kV according to patient size and/or use of iterative reconstruction technique. COMPARISON:  CT head 06/25/2018. FINDINGS: Brain: No acute intracranial hemorrhage. No CT evidence of acute infarct. Nonspecific hypoattenuation in the periventricular and subcortical white matter likely related to chronic microvascular ischemic changes versus changes in the setting of reported history of multiple sclerosis. No edema, mass effect, or midline shift. The basilar cisterns are patent. Ventricles: The ventricles are normal. Vascular: No hyperdense vessel or unexpected calcification. Skull: No acute or aggressive finding. Orbits: Similar radiopaque density along the anterior aspect of the left globe with sequelae of left lens replacement. Density may be postsurgical in  nature versus posttraumatic. Orbits otherwise unremarkable. Sinuses: Mucosal thickening in the left frontal sinus and bilateral anterior ethmoid air cells. Air-fluid levels in the bilateral maxillary sinuses. Other: Mastoid air cells are clear. IMPRESSION: No CT evidence of acute intracranial abnormality. Paranasal sinus disease as above with air-fluid levels in the bilateral maxillary sinuses. Recommend clinical correlation for acute sinusitis. Similar appearance of the left globe compared to 2019. Electronically Signed   By: Denny Flack M.D.   On: 12/24/2023 13:42   DG Chest Port 1 View Result Date: 12/24/2023 CLINICAL DATA:  Altered mental status. EXAM: PORTABLE CHEST 1 VIEW COMPARISON:  November 15, 2023. FINDINGS: The heart size and mediastinal contours are within normal limits. Large hiatal hernia is noted. Both lungs are clear. The visualized skeletal structures are unremarkable. IMPRESSION: No active disease. Electronically Signed   By: Rosalene Colon M.D.   On: 12/24/2023 13:22   Scheduled Meds:  enoxaparin (LOVENOX) injection  40 mg Subcutaneous Q24H   pantoprazole  40 mg Oral BID AC   sodium chloride flush  3 mL Intravenous Q12H   Continuous Infusions:  cefTRIAXone (ROCEPHIN)  IV       LOS: 0 days    Time spent: 50 MINS    Magdalene School, MD [facility]   If 7PM-7AM, please contact night-coverage

## 2023-12-25 NOTE — Care Management Obs Status (Signed)
 MEDICARE OBSERVATION STATUS NOTIFICATION   Patient Details  Name: Heather Lutz MRN: [id number] Date of Birth: 10/31/1945   Medicare Observation Status Notification Given:  Yes    Bayan Kushnir Liane Redman, LCSW 12/25/2023, 12:11 PM

## 2023-12-26 DIAGNOSIS — G9341 Metabolic encephalopathy: Secondary | ICD-10-CM | POA: Diagnosis not present

## 2023-12-26 LAB — CBC
HCT: 39.5 % (ref 36.0–46.0)
Hemoglobin: 11.3 g/dL — ABNORMAL LOW (ref 12.0–15.0)
MCH: 23.3 pg — ABNORMAL LOW (ref 26.0–34.0)
MCHC: 28.6 g/dL — ABNORMAL LOW (ref 30.0–36.0)
MCV: 81.3 fL (ref 80.0–100.0)
Platelets: 164 10*3/uL (ref 150–400)
RBC: 4.86 MIL/uL (ref 3.87–5.11)
RDW: 27.9 % — ABNORMAL HIGH (ref 11.5–15.5)
WBC: 5.9 10*3/uL (ref 4.0–10.5)
nRBC: 0 % (ref 0.0–0.2)

## 2023-12-26 LAB — BASIC METABOLIC PANEL WITH GFR
Anion gap: 10 (ref 5–15)
BUN: 20 mg/dL (ref 8–23)
CO2: 22 mmol/L (ref 22–32)
Calcium: 9.7 mg/dL (ref 8.9–10.3)
Chloride: 105 mmol/L (ref 98–111)
Creatinine, Ser: 0.48 mg/dL (ref 0.44–1.00)
GFR, Estimated: >60 mL/min (ref >60)
Glucose, Bld: 74 mg/dL (ref 70–99)
Potassium: 4 mmol/L (ref 3.5–5.1)
Sodium: 137 mmol/L (ref 135–145)

## 2023-12-26 MED ORDER — CEPHALEXIN 750 MG PO CAPS: 750.0000 mg | ORAL_CAPSULE | Freq: Two times a day (BID) | ORAL | 0 refills | Status: AC

## 2023-12-26 NOTE — Plan of Care (Signed)
  Problem: Clinical Measurements: Goal: Ability to maintain clinical measurements within normal limits will improve Outcome: Progressing   Problem: Education: Goal: Knowledge of General Education information will improve Description: Including pain rating scale, medication(s)/side effects and non-pharmacologic comfort measures Outcome: Progressing   Problem: Clinical Measurements: Goal: Diagnostic test results will improve Outcome: Progressing   Problem: Activity: Goal: Risk for activity intolerance will decrease Outcome: Progressing

## 2023-12-26 NOTE — Plan of Care (Addendum)
 VSS. Patient given PRN Tylenol for pain. No acute events overnight.  Problem: Education: Goal: Knowledge of General Education information will improve Description: Including pain rating scale, medication(s)/side effects and non-pharmacologic comfort measures Outcome: Progressing   Problem: Clinical Measurements: Goal: Ability to maintain clinical measurements within normal limits will improve Outcome: Progressing Goal: Will remain free from infection Outcome: Progressing   Problem: Safety: Goal: Ability to remain free from injury will improve Outcome: Progressing   Problem: Skin Integrity: Goal: Risk for impaired skin integrity will decrease Outcome: Progressing   Problem: Urinary Elimination: Goal: Signs and symptoms of infection will decrease Outcome: Progressing

## 2023-12-26 NOTE — Discharge Instructions (Signed)
 Advised to follow-up with primary care physician in 1 week. Advised to take Keflex 750 mg twice daily for 5 days. Advised to drink more fluids. Home  health services arranged.

## 2023-12-26 NOTE — Discharge Summary (Signed)
 Physician Discharge Summary  Heather Lutz:[id number] DOB: June 01, 1946 DOA: 12/24/2023  PCP: Naoma Bacca., MD  Admit date: 12/24/2023  Discharge date: 12/26/2023  Admitted From: Home.  Disposition:  Home Health Services.  Recommendations for Outpatient Follow-up:  Follow up with PCP in 1-2 weeks. Please obtain BMP/CBC in one week. Advised to take Keflex 750 mg twice daily for 5 days.. Advised to drink more fluids.. Home health services arranged.  Home Health: Home PT/OT Equipment/Devices: None  Discharge Condition: Stable CODE STATUS:Full code Diet recommendation: Heart Healthy   Brief Summary / Hospital Course: This 78 years old female with PMH significant for multiple sclerosis who is wheelchair-bound at baseline and now presented with confusion and somnolence. Patient's family became concerned that patient seems confused , more somnolent and slower to respond and was repeating herself.  This began yesterday and has been worsening.  Patient has not made any specific complaints.  Daughter reports patient's gabapentin was recently changed to baclofen a couple of days ago. In the ED patient was found to be hemodynamically stable but found to have UTI. Blood and urine cultures were obtained.  Patient started on empiric ceftriaxone.  Admitted for further evaluation.  Patient has made significant improvement after IV antibiotics were given.  Patient has felt much better and wants to be discharged.  PT and OT recommended home health services.  Patient being discharged home on oral antibiotics for few days.   Discharge Diagnoses:  Principal Problem:   Acute metabolic encephalopathy Active Problems:   Multiple sclerosis (HCC)   Acute lower UTI  Acute metabolic encephalopathy: > Multifactorial: Patient presented with somnolence, confusion, slow to respond and repeating herself. Could be multifactorial due to UTI and baclofen use. Her gabapentin was recently switched to baclofen  2 days ago. CT head without any acute findings. Hold baclofen for now, Continue IV ceftriaxone. Use delirium precautions. Expand workup if fails to improve as expected. Patient seems much improved and back to normal. Patient discharged on oral Keflex for few days.   Urinary tract infection Continue ceftriaxone, blood cultures urine cultures no growth so far. Follow-up clinical course.   Multiple sclerosis: Continue supportive care Hold baclofen for now. PT and OT evaluation.  Discharge Instructions  Discharge Instructions     Call MD for:  difficulty breathing, headache or visual disturbances   Complete by: As directed    Call MD for:  persistant dizziness or light-headedness   Complete by: As directed    Call MD for:  persistant nausea and vomiting   Complete by: As directed    Diet - low sodium heart healthy   Complete by: As directed    Diet Carb Modified   Complete by: As directed    Discharge instructions   Complete by: As directed    Advised to follow-up with primary care physician in 1 week. Advised to take Keflex 750 mg twice daily for 5 days. Advised to drink more fluids. Home  health services arranged.   Increase activity slowly   Complete by: As directed       Allergies as of 12/26/2023       Reactions   Banana Itching   Other Itching   Walnuts, honeydew, canteloupe,   Watermelon [citrullus Vulgaris] Itching   Epinephrine Anxiety, Other (See Comments)   Hallucinations         Medication List     STOP taking these medications    FIBER ADULT GUMMIES PO   gabapentin 300 MG capsule  Commonly known as: Neurontin   multivitamin with minerals Tabs tablet   pantoprazole 40 MG tablet Commonly known as: PROTONIX       TAKE these medications    acetaminophen 500 MG tablet Commonly known as: TYLENOL Take 1,000 mg by mouth every 6 (six) hours as needed for mild pain.   b complex vitamins capsule Take 1 capsule by mouth daily.   baclofen 10  MG tablet Commonly known as: LIORESAL Take 1 tablet (10 mg total) by mouth 3 (three) times daily as needed for muscle spasms.   carboxymethylcellulose 0.5 % Soln Commonly known as: REFRESH PLUS Place 2 drops into both eyes 2 (two) times daily as needed (dry eyes).   cephALEXin 750 MG capsule Commonly known as: Keflex Take 1 capsule (750 mg total) by mouth 2 (two) times daily for 5 days.   ELDERBERRY PO Take 1 tablet by mouth daily.   ferrous sulfate 325 (65 FE) MG tablet Take 325 mg by mouth daily with breakfast.   VITAMIN D3 PO Take 1 tablet by mouth daily.        Follow-up Information     Naoma Bacca., MD Follow up in 1 week(s).   Specialty: Internal Medicine Contact information: [address] [place] [address] [phone]         [facility] Follow up.   Why: Will be in touch Contact information: [address] [place] [address] [phone]                Allergies  Allergen Reactions   Banana Itching   Other Itching    Walnuts, honeydew, canteloupe,   Watermelon [Citrullus Vulgaris] Itching   Epinephrine Anxiety and Other (See Comments)    Hallucinations     Consultations: None   Procedures/Studies: CT Head Wo Contrast Result Date: 12/24/2023 CLINICAL DATA:  Mental status change, altered mental status, history of multiple sclerosis. EXAM: CT HEAD WITHOUT CONTRAST TECHNIQUE: Contiguous axial images were obtained from the base of the skull through the vertex without intravenous contrast. RADIATION DOSE REDUCTION: This exam was performed according to the departmental dose-optimization program which includes automated exposure control, adjustment of the mA and/or kV according to patient size and/or use of iterative reconstruction technique. COMPARISON:  CT head 06/25/2018. FINDINGS: Brain: No acute intracranial hemorrhage. No CT evidence of acute infarct. Nonspecific hypoattenuation in the periventricular and subcortical  white matter likely related to chronic microvascular ischemic changes versus changes in the setting of reported history of multiple sclerosis. No edema, mass effect, or midline shift. The basilar cisterns are patent. Ventricles: The ventricles are normal. Vascular: No hyperdense vessel or unexpected calcification. Skull: No acute or aggressive finding. Orbits: Similar radiopaque density along the anterior aspect of the left globe with sequelae of left lens replacement. Density may be postsurgical in nature versus posttraumatic. Orbits otherwise unremarkable. Sinuses: Mucosal thickening in the left frontal sinus and bilateral anterior ethmoid air cells. Air-fluid levels in the bilateral maxillary sinuses. Other: Mastoid air cells are clear. IMPRESSION: No CT evidence of acute intracranial abnormality. Paranasal sinus disease as above with air-fluid levels in the bilateral maxillary sinuses. Recommend clinical correlation for acute sinusitis. Similar appearance of the left globe compared to 2019. Electronically Signed   By: Denny Flack M.D.   On: 12/24/2023 13:42   DG Chest Port 1 View Result Date: 12/24/2023 CLINICAL DATA:  Altered mental status. EXAM: PORTABLE CHEST 1 VIEW COMPARISON:  November 15, 2023. FINDINGS: The heart size and mediastinal contours  are within normal limits. Large hiatal hernia is noted. Both lungs are clear. The visualized skeletal structures are unremarkable. IMPRESSION: No active disease. Electronically Signed   By: Rosalene Colon M.D.   On: 12/24/2023 13:22    Subjective: Patient was seen and examined at bedside.  Overnight events noted.   Patient reports feeling much better and wants to be discharged home.  Discharge Exam: Vitals:   12/25/23 1943 12/26/23 0514  BP: (!) 143/92 (!) 141/85  Pulse: (!) 108 95  Resp: 18 16  Temp: 98.5 F (36.9 C) 97.7 F (36.5 C)  SpO2: 96% 100%   Vitals:   12/25/23 1427 12/25/23 1527 12/25/23 1943 12/26/23 0514  BP: 137/74 137/74 (!)  143/92 (!) 141/85  Pulse: (!) 105 (!) 105 (!) 108 95  Resp: 19 19 18 16   Temp: (!) 97.4 F (36.3 C) (!) 97.4 F (36.3 C) 98.5 F (36.9 C) 97.7 F (36.5 C)  TempSrc:  Oral    SpO2: 98%  96% 100%  Weight:  60.1 kg    Height:  5\' 3"  (1.6 m)      General: Pt is alert, awake, not in acute distress Cardiovascular: RRR, S1/S2 +, no rubs, no gallops Respiratory: CTA bilaterally, no wheezing, no rhonchi Abdominal: Soft, NT, ND, bowel sounds + Extremities: no edema, no cyanosis    The results of significant diagnostics from this hospitalization (including imaging, microbiology, ancillary and laboratory) are listed below for reference.     Microbiology: Recent Results (from the past 240 hours)  Blood culture (routine x 2)     Status: None (Preliminary result)   Collection Time: 12/24/23  2:30 PM   Specimen: BLOOD  Result Value Ref Range Status   Specimen Description   Final    BLOOD LEFT ANTECUBITAL Performed at [facility], [address] [place], [place] [address]    Special Requests   Final    BOTTLES DRAWN AEROBIC AND ANAEROBIC Blood Culture results may not be optimal due to an inadequate volume of blood received in culture bottles Performed at [facility], [address] [place], [place] [address]    Culture   Final    NO GROWTH 2 DAYS Performed at [facility], [address], [place], [place] [address]    Report Status PENDING  Incomplete  Blood culture (routine x 2)     Status: None (Preliminary result)   Collection Time: 12/24/23  2:39 PM   Specimen: BLOOD  Result Value Ref Range Status   Specimen Description   Final    BLOOD RIGHT ANTECUBITAL Performed at [facility], [address] [place], [place] [address]    Special Requests   Final    BOTTLES DRAWN AEROBIC AND ANAEROBIC Blood Culture results may not be optimal due to an inadequate volume of blood received in culture bottles Performed at [facility], [address] [place], [place] [address]    Culture   Final    NO GROWTH 2 DAYS Performed at [facility], [address] [place], [place] [address]    Report Status PENDING  Incomplete     Labs: BNP (last 3 results) No results for input(s): "BNP" in the last 8760 hours. Basic Metabolic Panel: Recent Labs  Lab 12/24/23 1153 12/24/23 1242 12/25/23 0614 12/26/23 0753  NA 142 143 139 137  K 3.8 3.7 4.0 4.0  CL 104  --  105 105  CO2 26  --  24 22  GLUCOSE 89  --  70 74  BUN 19  --  18 20  CREATININE 0.78  --  0.71 0.48  CALCIUM 10.5*  --  9.8 9.7   Liver Function Tests: Recent Labs  Lab 12/24/23 1153  AST 33  ALT 20  ALKPHOS 119  BILITOT 0.5  PROT 8.3*  ALBUMIN 3.8   No results for input(s): "LIPASE", "AMYLASE" in the last 168 hours. Recent Labs  Lab 12/24/23 1153  AMMONIA <10   CBC: Recent Labs  Lab 12/24/23 1153 12/24/23 1242 12/25/23 0614 12/26/23 0753  WBC 3.3*  --  4.2 5.9  NEUTROABS 1.4*  --   --   --   HGB 11.8* 11.9* 10.9* 11.3*  HCT 38.4 35.0* 35.9* 39.5  MCV 76.8*  --  78.4* 81.3  PLT 174  --  174 164   Cardiac Enzymes: No results for input(s): "CKTOTAL", "CKMB", "CKMBINDEX", "TROPONINI" in the last 168 hours. BNP: Invalid input(s): "POCBNP" CBG: Recent Labs  Lab 12/24/23 1115  GLUCAP 89   D-Dimer No results for input(s): "DDIMER" in the last 72 hours. Hgb A1c No results for input(s): "HGBA1C" in the last 72 hours. Lipid Profile No results for input(s): "CHOL", "HDL", "LDLCALC", "TRIG", "CHOLHDL", "LDLDIRECT" in the last 72 hours. Thyroid function studies No results for input(s): "TSH", "T4TOTAL", "T3FREE", "THYROIDAB" in the last 72 hours.  Invalid input(s): "FREET3" Anemia work up No results for input(s): "VITAMINB12", "FOLATE", "FERRITIN", "TIBC", "IRON", "RETICCTPCT" in the last 72 hours. Urinalysis    Component Value Date/Time   COLORURINE YELLOW 12/24/2023 1153   APPEARANCEUR CLOUDY (A) 12/24/2023 1153   LABSPEC  >=1.030 12/24/2023 1153   PHURINE 6.0 12/24/2023 1153   GLUCOSEU NEGATIVE 12/24/2023 1153   HGBUR LARGE (A) 12/24/2023 1153   BILIRUBINUR NEGATIVE 12/24/2023 1153   KETONESUR 15 (A) 12/24/2023 1153   PROTEINUR 100 (A) 12/24/2023 1153   UROBILINOGEN 0.2 11/17/2014 1700   NITRITE POSITIVE (A) 12/24/2023 1153   LEUKOCYTESUR LARGE (A) 12/24/2023 1153   Sepsis Labs Recent Labs  Lab 12/24/23 1153 12/25/23 0614 12/26/23 0753  WBC 3.3* 4.2 5.9   Microbiology Recent Results (from the past 240 hours)  Blood culture (routine x 2)     Status: None (Preliminary result)   Collection Time: 12/24/23  2:30 PM   Specimen: BLOOD  Result Value Ref Range Status   Specimen Description   Final    BLOOD LEFT ANTECUBITAL Performed at [facility], [address] [place], [place] [address]    Special Requests   Final    BOTTLES DRAWN AEROBIC AND ANAEROBIC Blood Culture results may not be optimal due to an inadequate volume of blood received in culture bottles Performed at [facility], [address] [place], [place] [address]    Culture   Final    NO GROWTH 2 DAYS Performed at [facility], [address] [place], [place] [address]    Report Status PENDING  Incomplete  Blood culture (routine x 2)     Status: None (Preliminary result)   Collection Time: 12/24/23  2:39 PM   Specimen: BLOOD  Result Value Ref Range Status   Specimen Description   Final    BLOOD RIGHT ANTECUBITAL Performed at [facility], [address] [place], [place] [address]    Special Requests   Final    BOTTLES DRAWN AEROBIC AND ANAEROBIC Blood  Culture results may not be optimal due to an inadequate volume of blood received in culture bottles Performed at [facility], [address] [place], [place] [address]    Culture   Final    NO GROWTH 2 DAYS Performed at [facility], [address], [place], [place] [address]    Report Status PENDING  Incomplete      Time coordinating discharge: Over 30 minutes  SIGNED:   Magdalene School, MD  [facility] 12/26/2023, 2:22 PM Pager

## 2023-12-26 NOTE — Evaluation (Signed)
 Physical Therapy Evaluation Only Patient Details Name: Heather Lutz MRN: [id number] DOB: 1945/12/15 Today's Date: 12/26/2023  History of Present Illness  Heather Lutz is a 78 y.o. female admitted with acute metabolic encephalopathy. Head CT: No CT evidence of acute intracranial abnormality. PMH: MS, legally blind, anemia, GERD, osteoporosis  Clinical Impression  Pt reports from home with spouse and son, sleeps in lift chair, son and spouse lift pt to transfer her to rollator or toilet, pt reports family pushes her on rollator throughout the home and uses transport chair in the community. Pt also reports spending majority of time in lift chair. On eval, pt needing total A to roll R/L to reposition linen in bed, also total A for trunk and BLE mobility to come to sitting EOB. No active BLE movement noted, pt reports "a little" numbness/tingling throughout BLE, resting in knee extension with slight hip external rotation and plantarflexion. PT able to perform heel slide to ~90 deg knee flexion bilaterally.  Pt appears to be at baseline, reports good family support/assist at home. Recommend hospital bed and hoyer lift to further support transfers, caregiver support and reduced risk for falls but pt politely declines. Recommend HHPT to evaluate/educate on transfer training and DME needs in the home. Acute PT will sign off at this time.      If plan is discharge home, recommend the following: Two people to help with walking and/or transfers;Two people to help with bathing/dressing/bathroom;Assistance with cooking/housework;Assist for transportation;Help with stairs or ramp for entrance;Direct supervision/assist for medications management   Can travel by private vehicle        Equipment Recommendations Hospital bed;Hoyer lift  Recommendations for Other Services       Functional Status Assessment Patient has not had a recent decline in their functional status     Precautions /  Restrictions Precautions Precautions: Fall Precaution/Restrictions Comments: legally blind Restrictions Weight Bearing Restrictions Per Provider Order: No      Mobility  Bed Mobility Overal bed mobility: Needs Assistance Bed Mobility: Rolling, Supine to Sit, Sit to Supine Rolling: Total assist   Supine to sit: Total assist Sit to supine: Total assist   General bed mobility comments: total A with rolling to reposition linen and for supine<>sit, heavy verbal cues and intermittent guiding or tactile cues prior to mobilizing    Transfers Overall transfer level: Needs assistance Equipment used: 2 person hand held assist Transfers: Bed to chair/wheelchair/BSC       Squat pivot transfers: Total assist, +2 physical assistance     General transfer comment: total A +2 for squat pivot bed<>recliner, unable to power to static standing, using bedpad to support, no significant weightbearing through BLE noted with transfer    Ambulation/Gait               General Gait Details: w/c at baseline  Stairs            Wheelchair Mobility     Tilt Bed    Modified Rankin (Stroke Patients Only)       Balance Overall balance assessment: Needs assistance   Sitting balance-Leahy Scale: Zero Sitting balance - Comments: max A for unsupported sitting EOB, posterior lean Postural control: Posterior lean                                   Pertinent Vitals/Pain Pain Assessment Pain Assessment: Faces Faces Pain Scale: Hurts a little bit Pain Location:  bil knees Pain Descriptors / Indicators:  ("stiff") Pain Intervention(s): Limited activity within patient's tolerance, Monitored during session, Repositioned    Home Living Family/patient expects to be discharged to:: Private residence Living Arrangements: Spouse/significant other;Children Available Help at Discharge: Family Type of Home: House Home Access: Ramped entrance       Home Layout: One  level Home Equipment: Designer, multimedia (4 wheels);Lift chair;Transport chair;Wheelchair - power Additional Comments: pt reports sleeps in lift chair, in lift chair majority of the day    Prior Function               Mobility Comments: pt reports son and spouse "lift her" for transfers, also states "I can't stand for long", sits on rollator and family pushes her to mobilize throughout the home, transport chair in the community ADLs Comments: pt reports spouse and son assist with dressing and toileting, sponge bathes, able to feed self; no longer has CNA as son has rearranged schedule to assist     Extremity/Trunk Assessment   Upper Extremity Assessment Upper Extremity Assessment: Generalized weakness    Lower Extremity Assessment Lower Extremity Assessment: Generalized weakness;RLE deficits/detail;LLE deficits/detail RLE Deficits / Details: no trace movement throughout LE, pt resting in full knee extension, slight external rotation, and plantar flexion; reports "a little" numbness/tingling throughout; PROM ankles to neutral, knees to ~90 deg flexion without pain LLE Deficits / Details: no trace movement throughout LE, pt resting in full knee extension, slight external rotation, and plantar flexion; reports "a little" numbness/tingling throughout; PROM ankles to neutral, knees to ~90 deg flexion without pain    Cervical / Trunk Assessment Cervical / Trunk Assessment: Kyphotic  Communication   Communication Communication: No apparent difficulties    Cognition Arousal: Alert Behavior During Therapy: WFL for tasks assessed/performed   PT - Cognitive impairments: No apparent impairments                         Following commands: Intact       Cueing       General Comments      Exercises     Assessment/Plan    PT Assessment All further PT needs can be met in the next venue of care  PT Problem List Decreased strength;Decreased  range of motion;Decreased activity tolerance;Decreased balance;Decreased mobility;Decreased coordination       PT Treatment Interventions      PT Goals (Current goals can be found in the Care Plan section)  Acute Rehab PT Goals Patient Stated Goal: return home with son and spouse to assist PT Goal Formulation: All assessment and education complete, DC therapy    Frequency       Co-evaluation               AM-PAC PT "6 Clicks" Mobility  Outcome Measure Help needed turning from your back to your side while in a flat bed without using bedrails?: Total Help needed moving from lying on your back to sitting on the side of a flat bed without using bedrails?: Total Help needed moving to and from a bed to a chair (including a wheelchair)?: Total Help needed standing up from a chair using your arms (e.g., wheelchair or bedside chair)?: Total Help needed to walk in hospital room?: Total Help needed climbing 3-5 steps with a railing? : Total 6 Click Score: 6    End of Session Equipment Utilized During Treatment: Gait belt Activity Tolerance: Patient tolerated treatment well Patient left: in  bed;with call bell/phone within reach;with bed alarm set Nurse Communication: Mobility status PT Visit Diagnosis: Other abnormalities of gait and mobility (R26.89);Muscle weakness (generalized) (M62.81);Other symptoms and signs involving the nervous system (R29.898)    Time: 1610-9604 PT Time Calculation (min) (ACUTE ONLY): 24 min   Charges:   PT Evaluation $PT Eval Moderate Complexity: 1 Mod   PT General Charges $$ ACUTE PT VISIT: 1 Visit         Tori Emojean Gertz PT, DPT 12/26/23, 11:04 AM

## 2023-12-26 NOTE — Plan of Care (Signed)
  Problem: Education: Goal: Knowledge of General Education information will improve Description: Including pain rating scale, medication(s)/side effects and non-pharmacologic comfort measures 12/26/2023 1257 by Helen Loa, RN Outcome: Adequate for Discharge 12/26/2023 1158 by Helen Loa, RN Outcome: Progressing   Problem: Health Behavior/Discharge Planning: Goal: Ability to manage health-related needs will improve 12/26/2023 1257 by Helen Loa, RN Outcome: Adequate for Discharge 12/26/2023 1158 by Helen Loa, RN Outcome: Progressing   Problem: Clinical Measurements: Goal: Ability to maintain clinical measurements within normal limits will improve 12/26/2023 1257 by Helen Loa, RN Outcome: Adequate for Discharge 12/26/2023 1158 by Helen Loa, RN Outcome: Progressing Goal: Will remain free from infection 12/26/2023 1257 by Helen Loa, RN Outcome: Adequate for Discharge 12/26/2023 1158 by Helen Loa, RN Outcome: Progressing Goal: Diagnostic test results will improve 12/26/2023 1257 by Helen Loa, RN Outcome: Adequate for Discharge 12/26/2023 1158 by Helen Loa, RN Outcome: Progressing Goal: Respiratory complications will improve 12/26/2023 1257 by Helen Loa, RN Outcome: Adequate for Discharge 12/26/2023 1158 by Helen Loa, RN Outcome: Progressing Goal: Cardiovascular complication will be avoided 12/26/2023 1257 by Helen Loa, RN Outcome: Adequate for Discharge 12/26/2023 1158 by Helen Loa, RN Outcome: Progressing   Problem: Activity: Goal: Risk for activity intolerance will decrease 12/26/2023 1257 by Helen Loa, RN Outcome: Adequate for Discharge 12/26/2023 1158 by Helen Loa, RN Outcome: Progressing   Problem: Nutrition: Goal: Adequate nutrition will be maintained 12/26/2023 1257 by Helen Loa, RN Outcome: Adequate for Discharge 12/26/2023 1158 by Helen Loa, RN Outcome: Progressing   Problem: Coping: Goal: Level of anxiety will  decrease 12/26/2023 1257 by Helen Loa, RN Outcome: Adequate for Discharge 12/26/2023 1158 by Helen Loa, RN Outcome: Progressing   Problem: Elimination: Goal: Will not experience complications related to bowel motility 12/26/2023 1257 by Helen Loa, RN Outcome: Adequate for Discharge 12/26/2023 1158 by Helen Loa, RN Outcome: Progressing Goal: Will not experience complications related to urinary retention 12/26/2023 1257 by Helen Loa, RN Outcome: Adequate for Discharge 12/26/2023 1158 by Helen Loa, RN Outcome: Progressing   Problem: Pain Managment: Goal: General experience of comfort will improve and/or be controlled 12/26/2023 1257 by Helen Loa, RN Outcome: Adequate for Discharge 12/26/2023 1158 by Helen Loa, RN Outcome: Progressing   Problem: Safety: Goal: Ability to remain free from injury will improve 12/26/2023 1257 by Helen Loa, RN Outcome: Adequate for Discharge 12/26/2023 1158 by Helen Loa, RN Outcome: Progressing   Problem: Skin Integrity: Goal: Risk for impaired skin integrity will decrease 12/26/2023 1257 by Helen Loa, RN Outcome: Adequate for Discharge 12/26/2023 1158 by Helen Loa, RN Outcome: Progressing   Problem: Urinary Elimination: Goal: Signs and symptoms of infection will decrease 12/26/2023 1257 by Helen Loa, RN Outcome: Adequate for Discharge 12/26/2023 1158 by Helen Loa, RN Outcome: Progressing

## 2023-12-29 LAB — CULTURE, BLOOD (ROUTINE X 2)
Culture: NO GROWTH
Culture: NO GROWTH

## 2024-01-04 ENCOUNTER — Other Ambulatory Visit (HOSPITAL_BASED_OUTPATIENT_CLINIC_OR_DEPARTMENT_OTHER): Payer: Self-pay

## 2024-03-08 ENCOUNTER — Emergency Department (HOSPITAL_BASED_OUTPATIENT_CLINIC_OR_DEPARTMENT_OTHER)

## 2024-03-08 ENCOUNTER — Encounter (HOSPITAL_BASED_OUTPATIENT_CLINIC_OR_DEPARTMENT_OTHER): Payer: Self-pay

## 2024-03-08 ENCOUNTER — Observation Stay (HOSPITAL_BASED_OUTPATIENT_CLINIC_OR_DEPARTMENT_OTHER)
Admission: EM | Admit: 2024-03-08 | Discharge: 2024-03-10 | Disposition: A | Discharge status: Home / Self Care | Attending: Emergency Medicine | Admitting: Emergency Medicine

## 2024-03-08 ENCOUNTER — Other Ambulatory Visit: Payer: Self-pay

## 2024-03-08 DIAGNOSIS — G35 Multiple sclerosis: Secondary | ICD-10-CM | POA: Diagnosis not present

## 2024-03-08 DIAGNOSIS — R109 Unspecified abdominal pain: Secondary | ICD-10-CM | POA: Insufficient documentation

## 2024-03-08 DIAGNOSIS — R2689 Other abnormalities of gait and mobility: Secondary | ICD-10-CM | POA: Diagnosis not present

## 2024-03-08 DIAGNOSIS — H548 Legal blindness, as defined in USA: Secondary | ICD-10-CM | POA: Insufficient documentation

## 2024-03-08 DIAGNOSIS — N3000 Acute cystitis without hematuria: Secondary | ICD-10-CM

## 2024-03-08 DIAGNOSIS — R4182 Altered mental status, unspecified: Secondary | ICD-10-CM | POA: Diagnosis present

## 2024-03-08 DIAGNOSIS — M810 Age-related osteoporosis without current pathological fracture: Secondary | ICD-10-CM | POA: Insufficient documentation

## 2024-03-08 DIAGNOSIS — R531 Weakness: Secondary | ICD-10-CM | POA: Diagnosis not present

## 2024-03-08 DIAGNOSIS — R5383 Other fatigue: Secondary | ICD-10-CM | POA: Diagnosis not present

## 2024-03-08 DIAGNOSIS — N390 Urinary tract infection, site not specified: Secondary | ICD-10-CM | POA: Diagnosis not present

## 2024-03-08 LAB — CBC WITH DIFFERENTIAL/PLATELET
Abs Immature Granulocytes: 0.02 10*3/uL (ref 0.00–0.07)
Basophils Absolute: 0 10*3/uL (ref 0.0–0.1)
Basophils Relative: 0 %
Eosinophils Absolute: 0 10*3/uL (ref 0.0–0.5)
Eosinophils Relative: 0 %
HCT: 37.9 % (ref 36.0–46.0)
Hemoglobin: 11.9 g/dL — ABNORMAL LOW (ref 12.0–15.0)
Immature Granulocytes: 0 %
Lymphocytes Relative: 15 %
Lymphs Abs: 1.2 10*3/uL (ref 0.7–4.0)
MCH: 25.2 pg — ABNORMAL LOW (ref 26.0–34.0)
MCHC: 31.4 g/dL (ref 30.0–36.0)
MCV: 80.1 fL (ref 80.0–100.0)
Monocytes Absolute: 0.5 10*3/uL (ref 0.1–1.0)
Monocytes Relative: 6 %
Neutro Abs: 6.1 10*3/uL (ref 1.7–7.7)
Neutrophils Relative %: 79 %
Platelets: 156 10*3/uL (ref 150–400)
RBC: 4.73 MIL/uL (ref 3.87–5.11)
RDW: 19.7 % — ABNORMAL HIGH (ref 11.5–15.5)
WBC: 7.8 10*3/uL (ref 4.0–10.5)
nRBC: 0 % (ref 0.0–0.2)

## 2024-03-08 LAB — TROPONIN T, HIGH SENSITIVITY
Troponin T High Sensitivity: 31 ng/L — ABNORMAL HIGH (ref <19)
Troponin T High Sensitivity: 30 ng/L — ABNORMAL HIGH (ref <19)

## 2024-03-08 LAB — URINALYSIS, ROUTINE W REFLEX MICROSCOPIC
Bilirubin Urine: NEGATIVE
Glucose, UA: NEGATIVE mg/dL
Ketones, ur: 40 mg/dL — AB
Nitrite: POSITIVE — AB
Protein, ur: >=300 mg/dL — AB
Specific Gravity, Urine: 1.025 (ref 1.005–1.030)
pH: 7 (ref 5.0–8.0)

## 2024-03-08 LAB — COMPREHENSIVE METABOLIC PANEL WITH GFR
ALT: 8 U/L (ref 0–44)
AST: 23 U/L (ref 15–41)
Albumin: 3.7 g/dL (ref 3.5–5.0)
Alkaline Phosphatase: 109 U/L (ref 38–126)
Anion gap: 15 (ref 5–15)
BUN: 23 mg/dL (ref 8–23)
CO2: 22 mmol/L (ref 22–32)
Calcium: 9.6 mg/dL (ref 8.9–10.3)
Chloride: 106 mmol/L (ref 98–111)
Creatinine, Ser: 0.72 mg/dL (ref 0.44–1.00)
GFR, Estimated: >60 mL/min (ref >60)
Glucose, Bld: 85 mg/dL (ref 70–99)
Potassium: 3.6 mmol/L (ref 3.5–5.1)
Sodium: 142 mmol/L (ref 135–145)
Total Bilirubin: 0.3 mg/dL (ref 0.0–1.2)
Total Protein: 7.3 g/dL (ref 6.5–8.1)

## 2024-03-08 LAB — URINALYSIS, MICROSCOPIC (REFLEX)

## 2024-03-08 LAB — AMMONIA: Ammonia: <13 umol/L (ref 9–35)

## 2024-03-08 LAB — CBG MONITORING, ED: Glucose-Capillary: 90 mg/dL (ref 70–99)

## 2024-03-08 LAB — LACTIC ACID, PLASMA: Lactic Acid, Venous: 0.9 mmol/L (ref 0.5–1.9)

## 2024-03-08 MED ORDER — ACETAMINOPHEN 325 MG PO TABS
650.0000 mg | ORAL_TABLET | Freq: Four times a day (QID) | ORAL | Status: DC | PRN
  Administered 2024-03-09 – 2024-03-10 (×3): 650 mg via ORAL
  Filled 2024-03-08 (×3): qty 2

## 2024-03-08 MED ORDER — ACETAMINOPHEN 650 MG RE SUPP: 650.0000 mg | Freq: Four times a day (QID) | RECTAL | Status: DC | PRN

## 2024-03-08 MED ORDER — SODIUM CHLORIDE 0.9 % IV BOLUS
1000.0000 mL | Freq: Once | INTRAVENOUS | Status: AC
  Administered 2024-03-08: 1000 mL via INTRAVENOUS

## 2024-03-08 MED ORDER — ORAL CARE MOUTH RINSE: 15.0000 mL | OROMUCOSAL | Status: DC | PRN

## 2024-03-08 MED ORDER — SODIUM CHLORIDE 0.9 % IV SOLN
2.0000 g | Freq: Once | INTRAVENOUS | Status: AC
  Administered 2024-03-08: 2 g via INTRAVENOUS
  Filled 2024-03-08: qty 20

## 2024-03-08 MED ORDER — BOOST / RESOURCE BREEZE PO LIQD CUSTOM
1.0000 | Freq: Three times a day (TID) | ORAL | Status: DC
  Administered 2024-03-08 – 2024-03-10 (×5): 1 via ORAL

## 2024-03-08 MED ORDER — ENOXAPARIN SODIUM 40 MG/0.4ML IJ SOSY
40.0000 mg | PREFILLED_SYRINGE | INTRAMUSCULAR | Status: DC
  Administered 2024-03-08 – 2024-03-09 (×2): 40 mg via SUBCUTANEOUS
  Filled 2024-03-08 (×2): qty 0.4

## 2024-03-08 MED ORDER — ONDANSETRON HCL 4 MG PO TABS: 4.0000 mg | ORAL_TABLET | Freq: Four times a day (QID) | ORAL | Status: DC | PRN

## 2024-03-08 MED ORDER — ONDANSETRON HCL 4 MG/2ML IJ SOLN: 4.0000 mg | Freq: Four times a day (QID) | INTRAMUSCULAR | Status: DC | PRN

## 2024-03-08 MED ORDER — SENNOSIDES-DOCUSATE SODIUM 8.6-50 MG PO TABS: 1.0000 | ORAL_TABLET | Freq: Every evening | ORAL | Status: DC | PRN

## 2024-03-08 NOTE — ED Notes (Signed)
 Pt. Reports her abd. Is hurting.

## 2024-03-08 NOTE — Plan of Care (Signed)

## 2024-03-08 NOTE — ED Notes (Signed)
 Family reports the Pt. Has been acting as if she is dehydrated.  Pt. Urinated in the chair yesterday.  Pt. Is alert and oriented to the RN and questions.  Pt. Does have noted redness in her inner thighs  and was cleaned very well by tech and RN before doing and in and out cath ordered by the EDP.

## 2024-03-08 NOTE — H&P (Signed)
 History and Physical  Heather Lutz:[id number] DOB: 1945/12/11 DOA: 03/08/2024  PCP: Deane Camie HERO., MD   Chief Complaint: Lethargy, confusion  HPI: Heather Lutz is a 78 y.o. female with medical history significant for multiple sclerosis, legally blind, osteoporosis, GERD, and IDA who presents med [facility] ED for evaluation of increased lethargy and confusion.  Per family, patient had become more lethargic over the last 2 days and mild confusion.  She had similar presentation in the setting of her UTI so they presented her to the ED for further evaluation. Patient pelvic pain and intermittent chills over the last few days but no dysuria, fever, nausea, vomiting, shortness of breath, falls or dizziness.  She has minimal use of her lower extremities but endorse some discomfort in the bottom of her feet.   ED Course: Initial vitals show afebrile, HR in the 90s to 100s but normotensive and SPO2 97% on room air. Initial labs significant for WBC 7.8, Hgb 11.9, normal kidney function, normal LFTs, troponin 31-30, glucose 85. EKG shows sinus tach. CXR shows no cardiopulmonary disease.  CT head with no acute intracranial abnormality.  Pt received IV NS 1 L bolus and IV Rocephin .  Patient was admitted to [facility] service and transferred to [facility].   Review of Systems: Please see HPI for pertinent positives and negatives. A complete 10 system review of systems are otherwise negative.  Past Medical History:  Diagnosis Date   Anemia    Blindness    Dysrhythmia    GERD (gastroesophageal reflux disease)    Glaucoma    Headache    History of kidney stones    Legally blind 2018   Multiple sclerosis (HCC)    Osteoporosis    Pelvic rash    Pneumonia    Past Surgical History:  Procedure Laterality Date   ABDOMINAL HYSTERECTOMY     CATARACT EXTRACTION Left    COLONOSCOPY N/A 11/19/2023   Procedure: COLONOSCOPY;  Surgeon: Shila Gustav GAILS, MD;  Location: [facility];  Service:  Gastroenterology;  Laterality: N/A;   CYSTOSCOPY WITH RETROGRADE PYELOGRAM, URETEROSCOPY AND STENT PLACEMENT Left 08/01/2018   Procedure: CYSTOSCOPY WITH RETROGRADE PYELOGRAM, URETEROSCOPY, HOLMIUM LASER AND STENT PLACEMENT;  Surgeon: Carolee Sherwood JONETTA DOUGLAS, MD;  Location: [facility];  Service: Urology;  Laterality: Left;   CYSTOSCOPY WITH RETROGRADE PYELOGRAM, URETEROSCOPY AND STENT PLACEMENT Bilateral 06/01/2022   Procedure: CYSTOSCOPY WITH BILATERAL RETROGRADE PYELOGRAM, RIGHT URETEROSCOPY WITH HOLMIUM LASER ,STONE BASKETRY AND STENT  PLACEMENT, LEFT DIAGNOSTIC URETEROSCOPY AND STENT PLACEMENT;  Surgeon: Carolee Sherwood JONETTA DOUGLAS, MD;  Location: [facility];  Service: Urology;  Laterality: Bilateral;   CYSTOSCOPY/URETEROSCOPY/HOLMIUM LASER/STENT PLACEMENT Bilateral 06/22/2022   Procedure: CYSTOSCOPY RIGHT URETERAL STENT REMOVAL LEFT URETEROSCOPY/HOLMIUM LASER/STENT PLACEMENT;  Surgeon: Carolee Sherwood JONETTA DOUGLAS, MD;  Location: [facility];  Service: Urology;  Laterality: Bilateral;   ESOPHAGOGASTRODUODENOSCOPY N/A 11/06/2012   Procedure: ESOPHAGOGASTRODUODENOSCOPY (EGD);  Surgeon: Jerrell KYM Sol, MD;  Location: [facility] ENDOSCOPY;  Service: Endoscopy;  Laterality: N/A;   ESOPHAGOGASTRODUODENOSCOPY N/A 11/19/2023   Procedure: EGD (ESOPHAGOGASTRODUODENOSCOPY);  Surgeon: Nandigam, Kavitha V, MD;  Location: [facility] ENDOSCOPY;  Service: Gastroenterology;  Laterality: N/A;   KIDNEY STONE SURGERY     TRANSURETHRAL RESECTION OF BLADDER TUMOR N/A 06/01/2022   Procedure: BLADDER BIOPSY  ;  Surgeon: Carolee Sherwood JONETTA DOUGLAS, MD;  Location: [facility];  Service: Urology;  Laterality: N/A;  90 MINS FOR CASE   Social History:  reports that she has never smoked. She has never used smokeless tobacco.  She reports that she does not drink alcohol  and does not use drugs.  Allergies  Allergen Reactions   Banana Itching   Other Itching    Walnuts, honeydew, canteloupe,   Watermelon [Citrullus Vulgaris] Itching   Epinephrine Anxiety and Other (See Comments)     Hallucinations     Family History  Problem Relation Age of Onset   CAD Father    Heart attack Neg Hx      Prior to Admission medications   Medication Sig Start Date End Date Taking? Authorizing Provider  acetaminophen  (TYLENOL ) 500 MG tablet Take 1,000 mg by mouth every 6 (six) hours as needed for mild pain.    [provider]  b complex vitamins capsule Take 1 capsule by mouth daily.    [provider]  baclofen  (LIORESAL ) 10 MG tablet Take 1 tablet (10 mg total) by mouth 3 (three) times daily as needed for muscle spasms. 09/22/17   Patsey Lot, MD  carboxymethylcellulose (REFRESH PLUS) 0.5 % SOLN Place 2 drops into both eyes 2 (two) times daily as needed (dry eyes).    [provider]  Cholecalciferol (VITAMIN D3 PO) Take 1 tablet by mouth daily.    [provider]  ELDERBERRY PO Take 1 tablet by mouth daily.    [provider]  ferrous sulfate  325 (65 FE) MG tablet Take 325 mg by mouth daily with breakfast.    [provider]    Physical Exam: BP 134/69 (BP Location: Right Arm)   Pulse 87   Temp 98.7 F (37.1 C) (Oral)   Resp 20   Ht 5' 2.99 (1.6 m)   Wt 60.6 kg   SpO2 100%   BMI 23.67 kg/m  General: Pleasant, chronically ill and weak appearing elderly woman laying in bed. No acute distress. HEENT: [place]/AT.  Legally blind. Anicteric sclera CV: Mild tachycardia.  Regular rhythm. No murmurs, rubs, or gallops. No LE edema Pulmonary: Lungs CTAB. Normal effort. No wheezing or rales. Abdominal: Soft, nontender, nondistended. Normal bowel sounds. Extremities: Palpable radial and DP pulses. Normal ROM. Skin: Warm and dry. No obvious rash or lesions. Neuro: Alert and oriented to self, place, time, person and situation. Moves both upper extremities, unable to voluntarily move lower extremities. Normal sensation to light touch.  Psych: Normal mood and affect          Labs on Admission:  Basic Metabolic Panel: Recent Labs   Lab 03/08/24 1202  NA 142  K 3.6  CL 106  CO2 22  GLUCOSE 85  BUN 23  CREATININE 0.72  CALCIUM  9.6   Liver Function Tests: Recent Labs  Lab 03/08/24 1202  AST 23  ALT 8  ALKPHOS 109  BILITOT 0.3  PROT 7.3  ALBUMIN 3.7   No results for input(s): LIPASE, AMYLASE in the last 168 hours. Recent Labs  Lab 03/08/24 1202  AMMONIA <13   CBC: Recent Labs  Lab 03/08/24 1202  WBC 7.8  NEUTROABS 6.1  HGB 11.9*  HCT 37.9  MCV 80.1  PLT 156   Cardiac Enzymes: No results for input(s): CKTOTAL, CKMB, CKMBINDEX, TROPONINI in the last 168 hours. BNP (last 3 results) No results for input(s): BNP in the last 8760 hours.  ProBNP (last 3 results) No results for input(s): PROBNP in the last 8760 hours.  CBG: Recent Labs  Lab 03/08/24 1225  GLUCAP 90    Radiological Exams on Admission: CT Head Wo Contrast Result Date: 03/08/2024 CLINICAL DATA:  Mental status change, unknown  cause. EXAM: CT HEAD WITHOUT CONTRAST TECHNIQUE: Contiguous axial images were obtained from the base of the skull through the vertex without intravenous contrast. RADIATION DOSE REDUCTION: This exam was performed according to the departmental dose-optimization program which includes automated exposure control, adjustment of the mA and/or kV according to patient size and/or use of iterative reconstruction technique. COMPARISON:  Head CT 12/24/2023 FINDINGS: Brain: There is no evidence of an acute infarct, intracranial hemorrhage, mass, midline shift, or extra-axial fluid collection. Cerebral volume is normal for age. The ventricles are normal in size. Mild cerebral white matter hypodensities are similar to the prior CT in this patient with a history of multiple sclerosis. Vascular: Calcified atherosclerosis at the skull base. No hyperdense vessel. Skull: No fracture or suspicious lesion. Sinuses/Orbits: Visualized paranasal sinuses and mastoid air cells are clear. Left cataract extraction. Unchanged  2 mm metallic density along the anterior margin of the left globe which could be postsurgical or traumatic. Other: None. IMPRESSION: No evidence of acute intracranial abnormality. Electronically Signed   By: Dasie Hamburg M.D.   On: 03/08/2024 13:14   DG Chest Port 1 View Result Date: 03/08/2024 CLINICAL DATA:  Altered mental status. EXAM: PORTABLE CHEST 1 VIEW COMPARISON:  December 24, 2023. FINDINGS: The heart size and mediastinal contours are within normal limits. Stable hiatal hernia. Both lungs are clear. The visualized skeletal structures are unremarkable. IMPRESSION: No active disease. Electronically Signed   By: Lynwood Landy Raddle M.D.   On: 03/08/2024 13:03   Assessment/Plan CHANY WOOLWORTH is a 78 y.o. female with medical history significant for multiple sclerosis, legally blind, osteoporosis, GERD, and IDA who presents [facility] ED for evaluation of increased lethargy and confusion and admitted for urinary tract infection  # Urinary tract infection - Pt presented with increased lethargy, pelvic pain and mild confusion - Urinalysis shows signs of infection - Patient hemodynamically stable, at baseline mental status - Continue IV rocephin  - F/u urine culture - Trend CBC and fever curve  # Multiple sclerosis # Legally blind - Continue as needed baclofen  - Continue vitamin supplementation and eyedrops  # Osteoporosis - Continue vitamin D  supplements  # Generalized weakness - In the setting of acute illness - PT/OT eval and treat - Protein supplementation   DVT prophylaxis: Lovenox      Code Status: Limited: Do not attempt resuscitation (DNR) -DNR-LIMITED -Do Not Intubate/DNI   Consults called: None  Family Communication: No family at bedside  Severity of Illness: The appropriate patient status for this patient is OBSERVATION. Observation status is judged to be reasonable and necessary in order to provide the required intensity of service to ensure the patient's  safety. The patient's presenting symptoms, physical exam findings, and initial radiographic and laboratory data in the context of their medical condition is felt to place them at decreased risk for further clinical deterioration. Furthermore, it is anticipated that the patient will be medically stable for discharge from the hospital within 2 midnights of admission.   Level of care: Med-Surg   This record has been created using Conservation officer, historic buildings. Errors have been sought and corrected, but may not always be located. Such creation errors do not reflect on the standard of care.   Lou Claretta HERO, MD 03/08/2024, 6:25 PM [facility] Pager: [phone] Isaiah 41:10   If 7PM-7AM, please contact night-coverage [email or web address] Password [email or web address]

## 2024-03-08 NOTE — ED Triage Notes (Signed)
 Arrives POV with complaints of altered mental status. Per family, the patient has become lethargic over the past 2 days and they are concerned she may be developing a UTI. Patient reports generalized body pain as well.

## 2024-03-08 NOTE — ED Provider Notes (Signed)
 [place] [facility] Provider Note   CSN: [id number] Arrival date & time: 03/08/24  1116     Patient presents with: Altered Mental Status   Heather Lutz is a 78 y.o. female.  She is brought in by family for altered mental status.  Symptoms started Monday evening and getting progressively worse.  More confused and lethargic.  She does endorse some abdominal pain but family states that is more chronic.  She has a history of MS and has minimal use of her legs, not ambulatory.  Also blind.  They said her baseline mental status is very good though and she attends to family business.  They have seen her like this once before when she had a urinary tract infection.  No reported fever or falls.  No cough or diarrhea.  No known urinary symptoms.   The history is provided by a relative, the spouse and the patient.  Altered Mental Status Presenting symptoms: disorientation and lethargy   Most recent episode:  Yesterday Episode history:  Continuous Timing:  Constant Progression:  Worsening Chronicity:  Recurrent Associated symptoms: abdominal pain   Associated symptoms: no difficulty breathing, no fever and no vomiting        Prior to Admission medications   Medication Sig Start Date End Date Taking? Authorizing Provider  acetaminophen  (TYLENOL ) 500 MG tablet Take 1,000 mg by mouth every 6 (six) hours as needed for mild pain.    [provider]  b complex vitamins capsule Take 1 capsule by mouth daily.    [provider]  baclofen  (LIORESAL ) 10 MG tablet Take 1 tablet (10 mg total) by mouth 3 (three) times daily as needed for muscle spasms. 09/22/17   Patsey Lot, MD  carboxymethylcellulose (REFRESH PLUS) 0.5 % SOLN Place 2 drops into both eyes 2 (two) times daily as needed (dry eyes).    [provider]  Cholecalciferol (VITAMIN D3 PO) Take 1 tablet by mouth daily.    [provider]  ELDERBERRY PO Take 1  tablet by mouth daily.    [provider]  ferrous sulfate  325 (65 FE) MG tablet Take 325 mg by mouth daily with breakfast.    [provider]    Allergies: Banana, Other, Watermelon [citrullus vulgaris], and Epinephrine    Review of Systems  Constitutional:  Negative for fever.  Gastrointestinal:  Positive for abdominal pain. Negative for vomiting.    Updated Vital Signs BP 120/73 (BP Location: Left Arm)   Pulse (!) 102   Temp 97.9 F (36.6 C) (Oral)   Resp 20   Ht 5' 3 (1.6 m)   Wt 60.1 kg   SpO2 98%   BMI 23.47 kg/m   Physical Exam Vitals and nursing note reviewed.  Constitutional:      General: She is not in acute distress.    Appearance: Normal appearance. She is well-developed.  HENT:     Head: Normocephalic and atraumatic.   Eyes:     Conjunctiva/sclera: Conjunctivae normal.    Cardiovascular:     Rate and Rhythm: Normal rate and regular rhythm.     Heart sounds: No murmur heard. Pulmonary:     Effort: Pulmonary effort is normal. No respiratory distress.     Breath sounds: Normal breath sounds. No stridor. No wheezing.  Abdominal:     Palpations: Abdomen is soft.     Tenderness: There is no abdominal tenderness. There is no guarding or rebound.   Musculoskeletal:  General: No tenderness or deformity. Normal range of motion.     Cervical back: Neck supple.   Skin:    General: Skin is warm and dry.   Neurological:     General: No focal deficit present.     Mental Status: She is lethargic and disoriented.     GCS: GCS eye subscore is 4. GCS verbal subscore is 5. GCS motor subscore is 6.     Comments: She is awake and will answer questions.  She is not a very good historian though.  She has normal use of her upper extremities and minimal use of her lower extremities.    (all labs ordered are listed, but only abnormal results are displayed) Labs Reviewed  CBC WITH DIFFERENTIAL/PLATELET - Abnormal; Notable for the following  components:      Result Value   Hemoglobin 11.9 (*)    MCH 25.2 (*)    RDW 19.7 (*)    All other components within normal limits  URINALYSIS, ROUTINE W REFLEX MICROSCOPIC - Abnormal; Notable for the following components:   APPearance CLOUDY (*)    Hgb urine dipstick LARGE (*)    Ketones, ur 40 (*)    Protein, ur >=300 (*)    Nitrite POSITIVE (*)    Leukocytes,Ua SMALL (*)    All other components within normal limits  URINALYSIS, MICROSCOPIC (REFLEX) - Abnormal; Notable for the following components:   Bacteria, UA MANY (*)    All other components within normal limits  TROPONIN T, HIGH SENSITIVITY - Abnormal; Notable for the following components:   Troponin T High Sensitivity 31 (*)    All other components within normal limits  TROPONIN T, HIGH SENSITIVITY - Abnormal; Notable for the following components:   Troponin T High Sensitivity 30 (*)    All other components within normal limits  URINE CULTURE  COMPREHENSIVE METABOLIC PANEL WITH GFR  AMMONIA  LACTIC ACID, PLASMA  CBG MONITORING, ED    EKG: EKG Interpretation Date/Time:  Wednesday March 08 2024 11:36:47 EDT Ventricular Rate:  101 PR Interval:  146 QRS Duration:  86 QT Interval:  324 QTC Calculation: 420 R Axis:   -18  Text Interpretation: Sinus tachycardia Borderline left axis deviation Abnormal T, consider ischemia, lateral leads increased rate from prior 4/25 Confirmed by Towana Sharper [phone]) on 03/08/2024 11:40:01 AM  Radiology: CT Head Wo Contrast Result Date: 03/08/2024 CLINICAL DATA:  Mental status change, unknown cause. EXAM: CT HEAD WITHOUT CONTRAST TECHNIQUE: Contiguous axial images were obtained from the base of the skull through the vertex without intravenous contrast. RADIATION DOSE REDUCTION: This exam was performed according to the departmental dose-optimization program which includes automated exposure control, adjustment of the mA and/or kV according to patient size and/or use of iterative  reconstruction technique. COMPARISON:  Head CT 12/24/2023 FINDINGS: Brain: There is no evidence of an acute infarct, intracranial hemorrhage, mass, midline shift, or extra-axial fluid collection. Cerebral volume is normal for age. The ventricles are normal in size. Mild cerebral white matter hypodensities are similar to the prior CT in this patient with a history of multiple sclerosis. Vascular: Calcified atherosclerosis at the skull base. No hyperdense vessel. Skull: No fracture or suspicious lesion. Sinuses/Orbits: Visualized paranasal sinuses and mastoid air cells are clear. Left cataract extraction. Unchanged 2 mm metallic density along the anterior margin of the left globe which could be postsurgical or traumatic. Other: None. IMPRESSION: No evidence of acute intracranial abnormality. Electronically Signed   By: Dasie Derrill HERO.D.  On: 03/08/2024 13:14   DG Chest Port 1 View Result Date: 03/08/2024 CLINICAL DATA:  Altered mental status. EXAM: PORTABLE CHEST 1 VIEW COMPARISON:  December 24, 2023. FINDINGS: The heart size and mediastinal contours are within normal limits. Stable hiatal hernia. Both lungs are clear. The visualized skeletal structures are unremarkable. IMPRESSION: No active disease. Electronically Signed   By: Lynwood Landy Raddle M.D.   On: 03/08/2024 13:03     Procedures   Medications Ordered in the ED  cefTRIAXone  (ROCEPHIN ) 2 g in sodium chloride  0.9 % 100 mL IVPB (0 g Intravenous Stopped 03/08/24 1536)  sodium chloride  0.9 % bolus 1,000 mL (0 mLs Intravenous Stopped 03/08/24 1602)    Clinical Course as of 03/08/24 1722  Wed Mar 08, 2024  1306 Chest x-ray interpreted by me as no acute infiltrate.  Awaiting radiology reading. [MB]  1404 Urinalysis looks concerning for infection.  Will give IV antibiotics.  Last positive culture for urine was pansensitive E. coli. [MB]    Clinical Course User Index [MB] Towana Ozell BROCKS, MD                                 Medical Decision  Making Amount and/or Complexity of Data Reviewed Labs: ordered. Radiology: ordered.  Risk Decision regarding hospitalization.   This patient complains of lethargy confusion possible UTI; this involves an extensive number of treatment Options and is a complaint that carries with it a high risk of complications and morbidity. The differential includes UTI, stroke, bleed, metabolic derangement, dehydration  I ordered, reviewed and interpreted labs, which included CBC with normal white count stable low hemoglobin, chemistries and LFTs normal, troponins mildly elevated but flat, urinalysis concerning for infection sent for culture I ordered medication IV fluids IV antibiotics and reviewed PMP when indicated. I ordered imaging studies which included chest x-ray and head CT and I independently    visualized and interpreted imaging which showed no acute findings Additional history obtained from patient's spouse and son Previous records obtained and reviewed in epic including recent PCP notes.  Was admitted in April for similar presentation Cardiac monitoring reviewed, normal sinus rhythm Social determinants considered, no significant barriers Critical Interventions: None  After the interventions stated above, I reevaluated the patient and found patient to be a little more alert and awake Admission and further testing considered, family felt she would be better off being admitted for continued management.  Patient's care signed out to Dr. Jerrol to talk with the hospitalist regarding admission.      Final diagnoses:  Lethargy  Lower urinary tract infectious disease    ED Discharge Orders     None          Towana Ozell BROCKS, MD 03/08/24 1724

## 2024-03-08 NOTE — ED Provider Notes (Signed)
  Physical Exam  BP 129/68   Pulse 87   Temp 97.9 F (36.6 C) (Oral)   Resp 15   Ht 5' 3 (1.6 m)   Wt 60.1 kg   SpO2 98%   BMI 23.47 kg/m   Physical Exam  Procedures  Procedures  ED Course / MDM   Clinical Course as of 03/08/24 1512  Wed Mar 08, 2024  1306 Chest x-ray interpreted by me as no acute infiltrate.  Awaiting radiology reading. [MB]  1404 Urinalysis looks concerning for infection.  Will give IV antibiotics.  Last positive culture for urine was pansensitive E. coli. [MB]    Clinical Course User Index [MB] Towana Ozell BROCKS, MD   Medical Decision Making Amount and/or Complexity of Data Reviewed Labs: ordered. Radiology: ordered.  Risk Decision regarding hospitalization.   88F presenting to the ED with AMS, pt had been lethargic over the past 2 days and concern for a UTI. Urine with UTI, got Rocephin , has MS. Pending delta trop and reassessment. Likely admit.   Hospitalist medicine consulted for admission and the patient was subsequently accepted for admission by Dr. Sonjia.       Jerrol Agent, MD 03/08/24 [phone]

## 2024-03-09 DIAGNOSIS — A419 Sepsis, unspecified organism: Secondary | ICD-10-CM | POA: Diagnosis not present

## 2024-03-09 DIAGNOSIS — G35 Multiple sclerosis: Secondary | ICD-10-CM

## 2024-03-09 DIAGNOSIS — R531 Weakness: Secondary | ICD-10-CM | POA: Diagnosis not present

## 2024-03-09 DIAGNOSIS — R5383 Other fatigue: Secondary | ICD-10-CM | POA: Diagnosis not present

## 2024-03-09 DIAGNOSIS — N390 Urinary tract infection, site not specified: Principal | ICD-10-CM

## 2024-03-09 LAB — BASIC METABOLIC PANEL WITH GFR
Anion gap: 12 (ref 5–15)
BUN: 27 mg/dL — ABNORMAL HIGH (ref 8–23)
CO2: 21 mmol/L — ABNORMAL LOW (ref 22–32)
Calcium: 9.5 mg/dL (ref 8.9–10.3)
Chloride: 108 mmol/L (ref 98–111)
Creatinine, Ser: 0.65 mg/dL (ref 0.44–1.00)
GFR, Estimated: >60 mL/min (ref >60)
Glucose, Bld: 82 mg/dL (ref 70–99)
Potassium: 3.4 mmol/L — ABNORMAL LOW (ref 3.5–5.1)
Sodium: 141 mmol/L (ref 135–145)

## 2024-03-09 LAB — CBC
HCT: 35.2 % — ABNORMAL LOW (ref 36.0–46.0)
Hemoglobin: 10.8 g/dL — ABNORMAL LOW (ref 12.0–15.0)
MCH: 25.4 pg — ABNORMAL LOW (ref 26.0–34.0)
MCHC: 30.7 g/dL (ref 30.0–36.0)
MCV: 82.6 fL (ref 80.0–100.0)
Platelets: 148 10*3/uL — ABNORMAL LOW (ref 150–400)
RBC: 4.26 MIL/uL (ref 3.87–5.11)
RDW: 19.7 % — ABNORMAL HIGH (ref 11.5–15.5)
WBC: 4.9 10*3/uL (ref 4.0–10.5)
nRBC: 0 % (ref 0.0–0.2)

## 2024-03-09 LAB — URINE CULTURE

## 2024-03-09 MED ORDER — SODIUM CHLORIDE 0.9 % IV SOLN
1.0000 g | INTRAVENOUS | Status: DC
  Administered 2024-03-09: 1 g via INTRAVENOUS
  Filled 2024-03-09: qty 10

## 2024-03-09 MED ORDER — DORZOLAMIDE HCL 2 % OP SOLN
1.0000 [drp] | Freq: Two times a day (BID) | OPHTHALMIC | Status: DC
  Administered 2024-03-09 – 2024-03-10 (×3): 1 [drp] via OPHTHALMIC
  Filled 2024-03-09: qty 10

## 2024-03-09 MED ORDER — B COMPLEX-C PO TABS
1.0000 | ORAL_TABLET | Freq: Every day | ORAL | Status: DC
  Administered 2024-03-09 – 2024-03-10 (×2): 1 via ORAL
  Filled 2024-03-09 (×2): qty 1

## 2024-03-09 MED ORDER — BACLOFEN 10 MG PO TABS
10.0000 mg | ORAL_TABLET | Freq: Three times a day (TID) | ORAL | Status: DC | PRN
  Administered 2024-03-09 – 2024-03-10 (×3): 10 mg via ORAL
  Filled 2024-03-09 (×3): qty 1

## 2024-03-09 MED ORDER — PANTOPRAZOLE SODIUM 40 MG PO TBEC
40.0000 mg | DELAYED_RELEASE_TABLET | Freq: Every day | ORAL | Status: DC
  Administered 2024-03-09 – 2024-03-10 (×2): 40 mg via ORAL
  Filled 2024-03-09 (×2): qty 1

## 2024-03-09 MED ORDER — VITAMIN D 25 MCG (1000 UNIT) PO TABS
1000.0000 [IU] | ORAL_TABLET | Freq: Every day | ORAL | Status: DC
  Administered 2024-03-09 – 2024-03-10 (×2): 1000 [IU] via ORAL
  Filled 2024-03-09 (×2): qty 1

## 2024-03-09 MED ORDER — POLYVINYL ALCOHOL 1.4 % OP SOLN: 2.0000 [drp] | Freq: Two times a day (BID) | OPHTHALMIC | Status: DC | PRN

## 2024-03-09 NOTE — Care Management Obs Status (Signed)
 MEDICARE OBSERVATION STATUS NOTIFICATION   Patient Details  Name: Heather Lutz MRN: [id number] Date of Birth: May 10, 1946   Medicare Observation Status Notification Given:  Yes    Sonda Manuella Quill, RN 03/09/2024, 1:27 PM

## 2024-03-09 NOTE — Progress Notes (Signed)
 PROGRESS NOTE    Heather Lutz  FMW:[id number] DOB: 1946-03-19 DOA: 03/08/2024 PCP: Deane Camie HERO., MD   Brief Narrative:  Heather Lutz is a 78 y.o. female with medical history significant for multiple sclerosis, legally blind, osteoporosis, GERD, and IDA who presents med [facility] ED for evaluation of increased lethargy and confusion.  Per family, patient had become more lethargic over the last 2 days and mild confusion.  She had similar presentation in the setting of her UTI so they presented her to the ED for further evaluation.   Assessment & Plan:   Principal Problem:   UTI (urinary tract infection) Active Problems:   Lethargy   Generalized weakness  Acute metabolic encephalopathy  - Baseline ANO x 4, presented with profound confusion oriented to self only - Now back to baseline  Acute urinary tract infection - Continue IV ceftriaxone , tentative plan for 3-day course transition to p.o. antibiotics as tolerated -Continue to follow cultures  Multiple sclerosis Ambulatory dysfunction, acute on chronic Legally blind  Osteoporosis   DVT prophylaxis: enoxaparin  (LOVENOX ) injection 40 mg Start: 03/08/24 2200   Code Status:   Code Status: Limited: Do not attempt resuscitation (DNR) -DNR-LIMITED -Do Not Intubate/DNI   Family Communication: None present  Status is: Inpatient  Dispo: The patient is from: Home              Anticipated d/c is to: Home              Anticipated d/c date is: 24 hours              Patient currently not medically stable for discharge  Consultants:  None  Procedures:  None  Antimicrobials:  Ceftriaxone  x 3 days  Subjective: No acute issues or events overnight, patient more awake alert and oriented today than previously, denies nausea vomiting diarrhea constipation headache fevers chills or chest pain.  Objective: Vitals:   03/08/24 1803 03/09/24 0014 03/09/24 0600 03/09/24 1211  BP: 134/69 119/68 128/64 125/71   Pulse: 87 99 88 88  Resp: 20 18 18 16   Temp: 98.7 F (37.1 C) 98.6 F (37 C) 98.6 F (37 C) 97.8 F (36.6 C)  TempSrc: Oral Oral Oral Oral  SpO2: 100% 97% 97% 97%  Weight:      Height:        Intake/Output Summary (Last 24 hours) at 03/09/2024 1212 Last data filed at 03/08/2024 1602 Gross per 24 hour  Intake 1101.03 ml  Output --  Net 1101.03 ml   Filed Weights   03/08/24 1128 03/08/24 1759  Weight: 60.1 kg 60.6 kg    Examination:  General:  Pleasantly resting in bed, No acute distress. HEENT: Visual fields severely limited. Neck:  Without mass or deformity.  Trachea is midline. Lungs:  Clear to auscultate bilaterally without rhonchi, wheeze, or rales. Heart:  Regular rate and rhythm.  Without murmurs, rubs, or gallops. Abdomen:  Soft, nontender, nondistended.  Without guarding or rebound. Extremities: Without cyanosis, clubbing, edema, or obvious deformity. Skin:  Warm and dry, no erythema.  Data Reviewed: I have personally reviewed following labs and imaging studies  CBC: Recent Labs  Lab 03/08/24 1202 03/09/24 0535  WBC 7.8 4.9  NEUTROABS 6.1  --   HGB 11.9* 10.8*  HCT 37.9 35.2*  MCV 80.1 82.6  PLT 156 148*   Basic Metabolic Panel: Recent Labs  Lab 03/08/24 1202 03/09/24 0535  NA 142 141  K 3.6 3.4*  CL 106 108  CO2  22 21*  GLUCOSE 85 82  BUN 23 27*  CREATININE 0.72 0.65  CALCIUM  9.6 9.5   GFR: Estimated Creatinine Clearance: 47.9 mL/min (by C-G formula based on SCr of 0.65 mg/dL).  Liver Function Tests: Recent Labs  Lab 03/08/24 1202  AST 23  ALT 8  ALKPHOS 109  BILITOT 0.3  PROT 7.3  ALBUMIN 3.7   No results found for this or any previous visit (from the past 240 hours).   Radiology Studies: CT Head Wo Contrast Result Date: 03/08/2024 CLINICAL DATA:  Mental status change, unknown cause. EXAM: CT HEAD WITHOUT CONTRAST TECHNIQUE: Contiguous axial images were obtained from the base of the skull through the vertex without intravenous  contrast. RADIATION DOSE REDUCTION: This exam was performed according to the departmental dose-optimization program which includes automated exposure control, adjustment of the mA and/or kV according to patient size and/or use of iterative reconstruction technique. COMPARISON:  Head CT 12/24/2023 FINDINGS: Brain: There is no evidence of an acute infarct, intracranial hemorrhage, mass, midline shift, or extra-axial fluid collection. Cerebral volume is normal for age. The ventricles are normal in size. Mild cerebral white matter hypodensities are similar to the prior CT in this patient with a history of multiple sclerosis. Vascular: Calcified atherosclerosis at the skull base. No hyperdense vessel. Skull: No fracture or suspicious lesion. Sinuses/Orbits: Visualized paranasal sinuses and mastoid air cells are clear. Left cataract extraction. Unchanged 2 mm metallic density along the anterior margin of the left globe which could be postsurgical or traumatic. Other: None. IMPRESSION: No evidence of acute intracranial abnormality. Electronically Signed   By: Dasie Hamburg M.D.   On: 03/08/2024 13:14   DG Chest Port 1 View Result Date: 03/08/2024 CLINICAL DATA:  Altered mental status. EXAM: PORTABLE CHEST 1 VIEW COMPARISON:  December 24, 2023. FINDINGS: The heart size and mediastinal contours are within normal limits. Stable hiatal hernia. Both lungs are clear. The visualized skeletal structures are unremarkable. IMPRESSION: No active disease. Electronically Signed   By: Lynwood Landy Raddle M.D.   On: 03/08/2024 13:03        Scheduled Meds:  B-complex with vitamin C  1 tablet Oral Daily   cholecalciferol  1,000 Units Oral Daily   dorzolamide   1 drop Both Eyes BID   enoxaparin  (LOVENOX ) injection  40 mg Subcutaneous Q24H   feeding supplement  1 Container Oral TID BM   pantoprazole   40 mg Oral Daily   Continuous Infusions:  cefTRIAXone  (ROCEPHIN )  IV       LOS: 0 days   Time spent:  Elsie JAYSON Montclair, DO [facility]  If 7PM-7AM, please contact night-coverage [email or web address]  03/09/2024, 12:12 PM

## 2024-03-09 NOTE — TOC Initial Note (Signed)
 Transition of Care [facility]) - Initial/Assessment Note    Patient Details  Name: Heather Lutz MRN: [id number] Date of Birth: Jul 13, 1946  Transition of Care [facility]) CM/SW Contact:    Sonda Manuella Quill, RN Phone Number: 03/09/2024, 1:34 PM  Clinical Narrative:                 TOC for d/c planning; HHPT recc; pt asleep in room; no family at bedside; spoke w/ Heather Lutz (spouse) [phone]; he says pt lives at home; he plans for her to return at d/c, and will provide transportation; Mr Trolinger verified insurance/PCP; he denied pt experiencing SDOH risks; pt has cane, walker, wheelchair, BSC, shower chair; she has HHPT/OT w/ [facility]; pt does not have home oxygen; pt's husband would like for TOC to discuss [facility] choice w/ pt; also pt's husband notified copy of MOON left on window seat in pt's room; spoke w/ Heather Lutz at [facility]; she says pt is active w/ agency for HHPT/OT; TOC will follow up w/ pt.  Expected Discharge Plan: Home w Home Health Services Barriers to Discharge: Continued Medical Work up   Patient Goals and CMS Choice Patient states their goals for this hospitalization and ongoing recovery are:: pt's husband Heather Lutz says she will return home CMS Medicare.gov Compare Post Acute Care list provided to:: Patient Represenative (must comment) (Ray Adan (spouse))   [place] ownership interest in [facility].provided to:: Spouse    Expected Discharge Plan and Services   Discharge Planning Services: CM Consult   Living arrangements for the past 2 months: Single Family Home                                      Prior Living Arrangements/Services Living arrangements for the past 2 months: Single Family Home Lives with:: Spouse Patient language and need for interpreter reviewed:: Yes Do you feel safe going back to the place where you live?: Yes      Need for Family Participation in Patient Care: Yes (Comment) Care giver support system in  place?: Yes (comment) Current home services: DME, Home OT, Home PT (cane, walker, wheelchair, BSC, shower chair; HHPT/OT w/ [facility]) Criminal Activity/Legal Involvement Pertinent to Current Situation/Hospitalization: No - Comment as needed  Activities of Daily Living   ADL Screening (condition at time of admission) Independently performs ADLs?: No Does the patient have a NEW difficulty with bathing/dressing/toileting/self-feeding that is expected to last >3 days?: No Does the patient have a NEW difficulty with getting in/out of bed, walking, or climbing stairs that is expected to last >3 days?: No Does the patient have a NEW difficulty with communication that is expected to last >3 days?: No Is the patient deaf or have difficulty hearing?: No Does the patient have difficulty seeing, even when wearing glasses/contacts?: Yes Does the patient have difficulty concentrating, remembering, or making decisions?: No  Permission Sought/Granted Permission sought to share information with : Case Manager Permission granted to share information with : Yes, Verbal Permission Granted  Share Information with NAME: Case Manager     Permission granted to share info w Relationship: Isis Costanza (spouse) [phone]     Emotional Assessment Appearance:: Appears stated age Attitude/Demeanor/Rapport: Unable to Assess Affect (typically observed): Unable to Assess Orientation: :  (unable to assess) Alcohol  / Substance Use: Not Applicable Psych Involvement: No (comment)  Admission diagnosis:  Lower urinary tract infectious disease [N39.0] UTI (urinary tract  infection) [N39.0] Lethargy [R53.83] Patient Active Problem List   Diagnosis Date Noted   Lethargy 03/09/2024   Generalized weakness 03/09/2024   UTI (urinary tract infection) 03/08/2024   Acute metabolic encephalopathy 12/24/2023   Acute gastric ulcer without hemorrhage or perforation 11/19/2023   Heme positive stool 11/17/2023   RSV  infection 11/17/2023   Acute anemia 11/16/2023   Acute blood loss anemia (ABLA) 11/16/2023   Renal calculi 06/01/2022   Legally blind 07/01/2021   Unable to walk 07/01/2021   Spasticity 07/01/2021   Dysesthesia 07/01/2021   Persistent cough 12/02/2020   Lumbar radiculopathy 09/04/2019   Elevated troponin 07/13/2018   LBBB (left bundle branch block) 07/13/2018   LVH (left ventricular hypertrophy) 07/13/2018   Coronary artery calcification seen on CAT scan 07/13/2018   Bacteremia due to Enterococcus 07/02/2018   E. 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/2017   Primary open angle glaucoma (POAG) of left eye, severe stage 06/21/2017   Age-related osteoporosis without current pathological fracture 08/21/2016   GERD (gastroesophageal reflux disease) 08/21/2016   Spastic gait 08/21/2016   Vitamin D  deficiency 08/21/2016   Venous stasis 11/20/2014   Lung nodule < 6cm on CT 11/20/2014   Left lower lobe pneumonia 11/17/2014   Influenza A (H1N1) 11/06/2012   Hematemesis 11/06/2012   CAP (community acquired pneumonia) 11/05/2012   Hypoxemia 11/05/2012   SOB (shortness of breath) 11/05/2012   Multiple sclerosis (HCC) 11/05/2012   PCP:  Deane Camie HERO., MD Pharmacy:   [id number] - [place], [place] - [address] [place] [address] Phone: [phone] Fax: [phone]     Social Drivers of Health (SDOH) Social History: SDOH Screenings   Food Insecurity: No Food Insecurity (03/09/2024)  Housing: Low Risk  (03/09/2024)   Transportation Needs: No Transportation Needs (03/09/2024)  Utilities: Not At Risk (03/09/2024)  Social Connections: Moderately Isolated (03/08/2024)  Tobacco Use: Low Risk  (03/08/2024)   SDOH Interventions: Food Insecurity Interventions: Intervention Not Indicated, Inpatient TOC Housing Interventions: Intervention Not Indicated, Inpatient TOC Transportation Interventions: Intervention Not Indicated, Inpatient TOC Utilities Interventions: Intervention Not Indicated, Inpatient TOC   Readmission Risk Interventions     No data to display

## 2024-03-09 NOTE — Evaluation (Signed)
 Occupational Therapy Evaluation Patient Details Name: Heather Lutz MRN: [id number] DOB: 17-Dec-1945 Today's Date: 03/09/2024   History of Present Illness   78 yr old female who [facility] ED for evaluation of increased lethargy and confusion and admitted for urinary tract infection. Pt with medical history significant for multiple sclerosis, legally blind, osteoporosis, GERD, and IDA     Clinical Impressions The pt is currently presenting with the below listed deficits (see OT problem list). As such, her occupational performance is impaired and she requires assist for self-care management. During the session today, she required max assist for rolling in bed, mod assist for simulated upper body dressing in bed, and max assist for simulated lower body dressing in bed. She was also noted to be with pronounced B LE weakness (history of MS, per medical chart), and significant chronic vision impairment, with the pt stating she can't even see shadows. She will benefit from further OT services to maximize her ADL performance. Home health OT and continued family assist is recommended.       If plan is discharge home, recommend the following:   A lot of help with bathing/dressing/bathroom;Assistance with cooking/housework;Assist for transportation;Direct supervision/assist for medications management;Two people to help with walking and/or transfers;Help with stairs or ramp for entrance     Functional Status Assessment   Patient has had a recent decline in their functional status and demonstrates the ability to make significant improvements in function in a reasonable and predictable amount of time.     Equipment Recommendations   None recommended by OT     Recommendations for Other Services         Precautions/Restrictions   Precautions Precautions: Fall Restrictions Weight Bearing Restrictions Per Provider Order: No     Mobility Bed Mobility Overal  bed mobility: Needs Assistance Bed Mobility: Rolling Rolling: Max assist                     ADL either performed or assessed with clinical judgement   ADL Overall ADL's : Needs assistance/impaired Eating/Feeding: Supervision/ safety;Set up;Bed level Eating/Feeding Details (indicate cue type and reason): Limited by significant chronic vision impairment Grooming: Set up;Supervision/safety;Bed level Grooming Details (indicate cue type and reason): Limited by significant chronic vision impairment         Upper Body Dressing : Moderate assistance;Bed level Upper Body Dressing Details (indicate cue type and reason): simulated Lower Body Dressing: Bed level;Maximal assistance       Toileting- Clothing Manipulation and Hygiene: Maximal assistance;Bed level               Vision Baseline Vision/History: 2 Legally blind Additional Comments: The pt reported she can't even see shadows.            Pertinent Vitals/Pain Pain Assessment Pain Assessment: 0-10 Pain Score: 6  Pain Location: groin Pain Intervention(s): Monitored during session, Repositioned     Extremity/Trunk Assessment Upper Extremity Assessment Upper Extremity Assessment: Right hand dominant;RUE deficits/detail;LUE deficits/detail RUE Deficits / Details: AROM WFL. Functional grip strength LUE Deficits / Details: AROM WFL. Functional grip strength   Lower Extremity Assessment RLE Deficits / Details: no AROM noted; PROM WFL LLE Deficits / Details: no AROM noted; PROM WFL       Communication Communication Communication: No apparent difficulties   Cognition Arousal: Alert Behavior During Therapy: WFL for tasks assessed/performed               OT - Cognition Comments: Oriented x4  Following commands: Intact                  Home Living Family/patient expects to be discharged to:: Private residence Living Arrangements: Spouse/significant other and  son Available Help at Discharge: Family;Available 24 hours/day Type of Home: House Home Access: Ramped entrance     Home Layout: One level     Bathroom Shower/Tub: Sponge bathes at baseline         Home Equipment: Wheelchair - power;Shower seat;Wheelchair Financial trader (4 wheels);Lift chair   Additional Comments: pt reports sleeps in lift chair, in lift chair majority of the day; lives with son and spouse, son works from home      Prior Functioning/Environment Prior Level of Function : Needs assist             Mobility Comments: pt reports son and spouse lift her for transfers, also states I can't stand for long, sits on rollator and family pushes her to mobilize throughout the home, transport chair in the community; she has not ambulated since ~2018. ADLs Comments: pt reported spouse and son assist with dressing and toileting, she performed sponge baths without assist once wheeled to the sink, she was able to feed self after set-up assist was provided, they had hired assist 1x per month for cleaning, and her family managed all the cooking.    OT Problem List: Decreased strength;Decreased range of motion;Impaired balance (sitting and/or standing);Decreased coordination;Pain   OT Treatment/Interventions: Self-care/ADL training;Therapeutic exercise;Therapeutic activities;Neuromuscular education;Energy conservation;DME and/or AE instruction;Patient/family education;Balance training      OT Goals(Current goals can be found in the care plan section)   Acute Rehab OT Goals Patient Stated Goal: to resume home therapy at discharge OT Goal Formulation: With patient Time For Goal Achievement: 03/23/24 Potential to Achieve Goals: Good   OT Frequency:  Min 2X/week       AM-PAC OT 6 Clicks Daily Activity     Outcome Measure Help from another person eating meals?: A Little Help from another person taking care of personal grooming?: A Little Help from another person  toileting, which includes using toliet, bedpan, or urinal?: A Lot Help from another person bathing (including washing, rinsing, drying)?: A Lot Help from another person to put on and taking off regular upper body clothing?: A Lot Help from another person to put on and taking off regular lower body clothing?: A Lot 6 Click Score: 14   End of Session Equipment Utilized During Treatment: Other (comment) (N/A) Nurse Communication: Other (comment)  Activity Tolerance: Patient tolerated treatment well Patient left: in bed;with call bell/phone within reach;with bed alarm set  OT Visit Diagnosis: Other abnormalities of gait and mobility (R26.89);Muscle weakness (generalized) (M62.81);Low vision, both eyes (H54.2);Pain                Time: 1043-1105 OT Time Calculation (min): 22 min Charges:  OT General Charges $OT Visit: 1 Visit OT Evaluation $OT Eval Moderate Complexity: 1 Mod    Maren Wiesen L Jimmy Stipes, OTR/L 03/09/2024, 2:19 PM

## 2024-03-09 NOTE — Evaluation (Signed)
 Physical Therapy Evaluation Patient Details Name: Heather Lutz MRN: [id number] DOB: Feb 18, 1946 Today's Date: 03/09/2024  History of Present Illness  78 y.o. female who presents med [facility] ED for evaluation of increased lethargy and confusion and admitted for urinary tract infection. Pt with medical history significant for multiple sclerosis, legally blind, osteoporosis, GERD, and IDA  Clinical Impression  Pt admitted with above diagnosis. Pt reports she sleeps in a lift chair at baseline. Her son and spouse lift her onto rollator seat to transfer her to the toilet. Pt reports she'd recently started HHPT to work on sitting balance. Today pt required max to total assist for bed mobility. Sitting balance is quite poor, she required  max assist to maintain trunk upright sitting at edge of bed. No active movement noted in BLEs. Pt stated her family can manage her care at home, and she'd like to resume HHPT.  Pt currently with functional limitations due to the deficits listed below (see PT Problem List). Pt will benefit from acute skilled PT to increase their independence and safety with mobility to allow discharge.           If plan is discharge home, recommend the following: Two people to help with walking and/or transfers;A lot of help with bathing/dressing/bathroom;Assistance with cooking/housework;Assist for transportation;Help with stairs or ramp for entrance;Direct supervision/assist for medications management;Direct supervision/assist for financial management   Can travel by private vehicle        Equipment Recommendations None recommended by PT  Recommendations for Other Services       Functional Status Assessment Patient has had a recent decline in their functional status and demonstrates the ability to make significant improvements in function in a reasonable and predictable amount of time.     Precautions / Restrictions Precautions Precautions: Fall Recall of  Precautions/Restrictions: Intact Restrictions Weight Bearing Restrictions Per Provider Order: No      Mobility  Bed Mobility Overal bed mobility: Needs Assistance Bed Mobility: Rolling, Supine to Sit, Sit to Supine Rolling: Max assist   Supine to sit: Total assist Sit to supine: Total assist   General bed mobility comments: Pt rolled L and R x 2 for pericare as she was soiled with BM. Pt able to minimally assist with roll by reaching for bed rails. Total assist supine to sit and sit to supine. Very poor trunk control sitting, max assist required.    Transfers                   General transfer comment: NT -maximove recommended    Ambulation/Gait               General Gait Details: non ambulatory at baseline  Stairs            Wheelchair Mobility     Tilt Bed    Modified Rankin (Stroke Patients Only)       Balance Overall balance assessment: Needs assistance Sitting-balance support: No upper extremity supported, Feet unsupported Sitting balance-Leahy Scale: Zero Sitting balance - Comments: max assist for sitting balance due to very poor trunk control. Pt quite kyphotic in sitting. Pt unable to reach rails for support in sitting.                                     Pertinent Vitals/Pain Pain Assessment Pain Assessment: No/denies pain    Home Living Family/patient expects to be discharged  to:: Private residence Living Arrangements: Spouse/significant other;Children Available Help at Discharge: Family;Available 24 hours/day Type of Home: House Home Access: Ramped entrance       Home Layout: One level Home Equipment: Shower seat;BSC/3in1;Wheelchair Financial trader (4 wheels);Lift chair;Transport chair;Wheelchair - power Additional Comments: pt reports sleeps in lift chair, in lift chair majority of the day; lives with son and spouse, son works from home    Prior Function Prior Level of Function : Needs assist        Physical Assist : Mobility (physical);ADLs (physical) Mobility (physical): Bed mobility;Transfers   Mobility Comments: pt reports son and spouse lift her for transfers, also states I can't stand for long, sits on rollator and family pushes her to mobilize throughout the home, transport chair in the community ADLs Comments: pt reports spouse and son assist with dressing and toileting, sponge bathes, able to feed self; no longer has CNA as son has rearranged schedule to assist     Extremity/Trunk Assessment   Upper Extremity Assessment Upper Extremity Assessment: Defer to OT evaluation    Lower Extremity Assessment Lower Extremity Assessment: LLE deficits/detail;RLE deficits/detail RLE Deficits / Details: no AROM noted; PROM WFL RLE Sensation: WNL LLE Deficits / Details: no AROM noted; PROM WFL LLE Sensation: WNL    Cervical / Trunk Assessment Cervical / Trunk Assessment: Kyphotic  Communication   Communication Communication: No apparent difficulties    Cognition Arousal: Alert Behavior During Therapy: WFL for tasks assessed/performed   PT - Cognitive impairments: No apparent impairments                                 Cueing       General Comments      Exercises     Assessment/Plan    PT Assessment Patient needs continued PT services  PT Problem List Decreased strength;Decreased activity tolerance;Decreased mobility;Decreased balance       PT Treatment Interventions Therapeutic activities;Functional mobility training;Therapeutic exercise    PT Goals (Current goals can be found in the Care Plan section)  Acute Rehab PT Goals Patient Stated Goal: to be able to sit up unsupported PT Goal Formulation: With patient Time For Goal Achievement: 03/23/24 Potential to Achieve Goals: Fair    Frequency Min 2X/week     Co-evaluation               AM-PAC PT 6 Clicks Mobility  Outcome Measure Help needed turning from your back to your side  while in a flat bed without using bedrails?: A Lot Help needed moving from lying on your back to sitting on the side of a flat bed without using bedrails?: Total Help needed moving to and from a bed to a chair (including a wheelchair)?: Total Help needed standing up from a chair using your arms (e.g., wheelchair or bedside chair)?: Total Help needed to walk in hospital room?: Total Help needed climbing 3-5 steps with a railing? : Total 6 Click Score: 7    End of Session   Activity Tolerance: Patient tolerated treatment well Patient left: in bed;with bed alarm set;with call bell/phone within reach Nurse Communication: Mobility status;Need for lift equipment PT Visit Diagnosis: Other abnormalities of gait and mobility (R26.89)    Time: 0940-1007 PT Time Calculation (min) (ACUTE ONLY): 27 min   Charges:   PT Evaluation $PT Eval Moderate Complexity: 1 Mod PT Treatments $Therapeutic Activity: 8-22 mins PT General Charges $$ ACUTE PT VISIT: 1  Visit        Sylvan Nest Kistler PT 03/09/2024  Acute Rehabilitation Services  Office [phone]

## 2024-03-09 NOTE — Plan of Care (Signed)

## 2024-03-10 ENCOUNTER — Other Ambulatory Visit (HOSPITAL_COMMUNITY): Payer: Self-pay

## 2024-03-10 ENCOUNTER — Other Ambulatory Visit (HOSPITAL_BASED_OUTPATIENT_CLINIC_OR_DEPARTMENT_OTHER): Payer: Self-pay

## 2024-03-10 DIAGNOSIS — N3000 Acute cystitis without hematuria: Secondary | ICD-10-CM | POA: Diagnosis not present

## 2024-03-10 LAB — CBC
HCT: 37.8 % (ref 36.0–46.0)
Hemoglobin: 11.6 g/dL — ABNORMAL LOW (ref 12.0–15.0)
MCH: 25.8 pg — ABNORMAL LOW (ref 26.0–34.0)
MCHC: 30.7 g/dL (ref 30.0–36.0)
MCV: 84 fL (ref 80.0–100.0)
Platelets: 157 10*3/uL (ref 150–400)
RBC: 4.5 MIL/uL (ref 3.87–5.11)
RDW: 19.3 % — ABNORMAL HIGH (ref 11.5–15.5)
WBC: 4.2 10*3/uL (ref 4.0–10.5)
nRBC: 0 % (ref 0.0–0.2)

## 2024-03-10 LAB — BASIC METABOLIC PANEL WITH GFR
Anion gap: 10 (ref 5–15)
BUN: 19 mg/dL (ref 8–23)
CO2: 23 mmol/L (ref 22–32)
Calcium: 9.3 mg/dL (ref 8.9–10.3)
Chloride: 108 mmol/L (ref 98–111)
Creatinine, Ser: 0.58 mg/dL (ref 0.44–1.00)
GFR, Estimated: >60 mL/min (ref >60)
Glucose, Bld: 82 mg/dL (ref 70–99)
Potassium: 3.4 mmol/L — ABNORMAL LOW (ref 3.5–5.1)
Sodium: 141 mmol/L (ref 135–145)

## 2024-03-10 LAB — URINE CULTURE: Culture: >=100000 — AB

## 2024-03-10 MED ORDER — SENNOSIDES-DOCUSATE SODIUM 8.6-50 MG PO TABS
1.0000 | ORAL_TABLET | Freq: Every evening | ORAL | 0 refills | Status: DC | PRN
  Filled 2024-03-10: qty 30, 30d supply, fill #0

## 2024-03-10 MED ORDER — SULFAMETHOXAZOLE-TRIMETHOPRIM 800-160 MG PO TABS
1.0000 | ORAL_TABLET | Freq: Two times a day (BID) | ORAL | 0 refills | Status: AC
  Filled 2024-03-10 (×2): qty 8, 4d supply, fill #0

## 2024-03-10 NOTE — Plan of Care (Signed)

## 2024-03-10 NOTE — TOC Transition Note (Signed)
 Transition of Care [facility]) - Discharge Note   Patient Details  Name: Heather Lutz MRN: [id number] Date of Birth: 1945-09-17  Transition of Care [facility]) CM/SW Contact:  Sonda Manuella Quill, RN Phone Number: 03/10/2024, 8:55 AM   Clinical Narrative:    D/C orders received; spoke w/ pt and spouse in room; she agrees to receive recc HHPT/OT; she would like to con't services w/ [facility]; notified Channing Ee at agency notified; no TOC needs.   Final next level of care: Home w Home Health Services Barriers to Discharge: No Barriers Identified   Patient Goals and CMS Choice Patient states their goals for this hospitalization and ongoing recovery are:: pt's husband [name] says she will return home CMS Medicare.gov Compare Post Acute Care list provided to:: Patient Represenative (must comment) ([name] (spouse))    ownership interest in [facility].provided to:: Spouse    Discharge Placement                       Discharge Plan and Services Additional resources added to the After Visit Summary for     Discharge Planning Services: CM Consult                      HH Arranged: PT, OT [facility] Agency: [facility] Date [facility] Agency Contacted: 03/10/24 Time HH Agency Contacted: [phone] Representative spoke with at [facility] Agency: Channing Ee  Social Drivers of Health (SDOH) Interventions SDOH Screenings   Food Insecurity: No Food Insecurity (03/09/2024)  Housing: Low Risk  (03/09/2024)  Transportation Needs: No Transportation Needs (03/09/2024)  Utilities: Not At Risk (03/09/2024)  Social Connections: Moderately Isolated (03/08/2024)  Tobacco Use: Low Risk  (03/08/2024)     Readmission Risk Interventions     No data to display

## 2024-03-10 NOTE — Discharge Summary (Signed)
 Physician Discharge Summary  Heather Lutz FMW:[id number] DOB: Aug 11, 1946 DOA: 03/08/2024  PCP: Deane Camie HERO., MD  Admit date: 03/08/2024 Discharge date: 03/10/2024  Admitted From: Home Disposition: Home  Recommendations for Outpatient Follow-up:  Follow up with PCP in 1-2 weeks Please obtain BMP/CBC in one week  Home Health: None Equipment/Devices: None  Discharge Condition: Stable CODE STATUS: DNR/DNI Diet recommendation: As tolerated  Brief/Interim Summary: Heather Lutz is a 78 y.o. female with medical history significant for multiple sclerosis, legally blind, osteoporosis, GERD, and IDA who presents med [facility] ED for evaluation of increased lethargy and confusion.  Per family, patient had become more lethargic over the last 2 days and mild confusion.  She had similar presentation in the setting of her UTI so they presented her to the ED for further evaluation.  Patient admitted as above with worsening mental status in the setting of acute UTI secondary to Klebsiella.  Transition to Bactrim  in the setting of improvement and resolution of UTI.  Follow-up with PCP and primary team outpatient as discussed.  Discharge Diagnoses:  Principal Problem:   UTI (urinary tract infection) Active Problems:   Lethargy   Generalized weakness  Acute metabolic encephalopathy, resolved - Baseline ANO x 4, presented with profound confusion oriented to self only - Now back to baseline   Acute urinary tract infection, improving, secondary to klebsiella - Transition to bactrim  per cultures/sensitivity   Multiple sclerosis Ambulatory dysfunction, acute on chronic Legally blind Osteoporosis  - Stable  Discharge Instructions  Discharge Instructions     Call MD for:  difficulty breathing, headache or visual disturbances   Complete by: As directed    Call MD for:  extreme fatigue   Complete by: As directed    Call MD for:  hives   Complete by: As directed    Call MD  for:  persistant dizziness or light-headedness   Complete by: As directed    Call MD for:  persistant nausea and vomiting   Complete by: As directed    Call MD for:  severe uncontrolled pain   Complete by: As directed    Call MD for:  temperature >100.4   Complete by: As directed    Diet - low sodium heart healthy   Complete by: As directed    Increase activity slowly   Complete by: As directed       Allergies as of 03/10/2024       Reactions   Banana Itching   Other Itching   Walnuts, honeydew, canteloupe,   Watermelon [citrullus Vulgaris] Itching   Epinephrine Anxiety, Other (See Comments)   Hallucinations         Medication List     TAKE these medications    acetaminophen  500 MG tablet Commonly known as: TYLENOL  Take 1,000 mg by mouth every 6 (six) hours as needed for mild pain (pain score 1-3) or moderate pain (pain score 4-6).   b complex vitamins capsule Take 1 capsule by mouth daily.   baclofen  10 MG tablet Commonly known as: LIORESAL  Take 1 tablet (10 mg total) by mouth 3 (three) times daily as needed for muscle spasms. What changed: when to take this   brimonidine  0.2 % ophthalmic solution Commonly known as: ALPHAGAN  Place 1 drop into both eyes in the morning and at bedtime.   carboxymethylcellulose 0.5 % Soln Commonly known as: REFRESH PLUS Place 1 drop into both eyes in the morning and at bedtime.   dorzolamide  2 % ophthalmic  solution Commonly known as: TRUSOPT  Place 1 drop into both eyes 2 (two) times daily.   ELDERBERRY PO Take 1 tablet by mouth daily.   ferrous sulfate  325 (65 FE) MG tablet Take 325 mg by mouth daily with breakfast.   GABAPENTIN  PO Take 1 capsule by mouth daily as needed (nerve pain).   omeprazole 40 MG capsule Commonly known as: PRILOSEC Take 40 mg by mouth daily.   senna-docusate 8.6-50 MG tablet Commonly known as: Senokot-S Take 1 tablet by mouth at bedtime as needed for mild constipation.    sulfamethoxazole -trimethoprim  800-160 MG tablet Commonly known as: Bactrim  DS Take 1 tablet by mouth 2 (two) times daily for 4 days.   VITAMIN D3 PO Take 1 tablet by mouth daily.        Allergies  Allergen Reactions   Banana Itching   Other Itching    Walnuts, honeydew, canteloupe,   Watermelon [Citrullus Vulgaris] Itching   Epinephrine Anxiety and Other (See Comments)    Hallucinations     Consultations: None  Procedures/Studies: CT Head Wo Contrast Result Date: 03/08/2024 CLINICAL DATA:  Mental status change, unknown cause. EXAM: CT HEAD WITHOUT CONTRAST TECHNIQUE: Contiguous axial images were obtained from the base of the skull through the vertex without intravenous contrast. RADIATION DOSE REDUCTION: This exam was performed according to the departmental dose-optimization program which includes automated exposure control, adjustment of the mA and/or kV according to patient size and/or use of iterative reconstruction technique. COMPARISON:  Head CT 12/24/2023 FINDINGS: Brain: There is no evidence of an acute infarct, intracranial hemorrhage, mass, midline shift, or extra-axial fluid collection. Cerebral volume is normal for age. The ventricles are normal in size. Mild cerebral white matter hypodensities are similar to the prior CT in this patient with a history of multiple sclerosis. Vascular: Calcified atherosclerosis at the skull base. No hyperdense vessel. Skull: No fracture or suspicious lesion. Sinuses/Orbits: Visualized paranasal sinuses and mastoid air cells are clear. Left cataract extraction. Unchanged 2 mm metallic density along the anterior margin of the left globe which could be postsurgical or traumatic. Other: None. IMPRESSION: No evidence of acute intracranial abnormality. Electronically Signed   By: Dasie Hamburg M.D.   On: 03/08/2024 13:14   DG Chest Port 1 View Result Date: 03/08/2024 CLINICAL DATA:  Altered mental status. EXAM: PORTABLE CHEST 1 VIEW COMPARISON:   December 24, 2023. FINDINGS: The heart size and mediastinal contours are within normal limits. Stable hiatal hernia. Both lungs are clear. The visualized skeletal structures are unremarkable. IMPRESSION: No active disease. Electronically Signed   By: Lynwood Landy Raddle M.D.   On: 03/08/2024 13:03     Subjective: No acute issues or events overnight   Discharge Exam: Vitals:   03/09/24 2033 03/10/24 0618  BP: 126/69 138/73  Pulse: 85 75  Resp: 19 17  Temp: 98.5 F (36.9 C) 97.7 F (36.5 C)  SpO2: 95% 100%   Vitals:   03/09/24 0600 03/09/24 1211 03/09/24 2033 03/10/24 0618  BP: 128/64 125/71 126/69 138/73  Pulse: 88 88 85 75  Resp: 18 16 19 17   Temp: 98.6 F (37 C) 97.8 F (36.6 C) 98.5 F (36.9 C) 97.7 F (36.5 C)  TempSrc: Oral Oral Oral Oral  SpO2: 97% 97% 95% 100%  Weight:      Height:        General: Pt is alert, awake, not in acute distress Cardiovascular: RRR, S1/S2 +, no rubs, no gallops Respiratory: CTA bilaterally, no wheezing, no rhonchi Abdominal: Soft, NT,  ND, bowel sounds + Extremities: no edema, no cyanosis    The results of significant diagnostics from this hospitalization (including imaging, microbiology, ancillary and laboratory) are listed below for reference.     Microbiology: Recent Results (from the past 240 hours)  Urine Culture     Status: Abnormal   Collection Time: 03/08/24 12:02 PM   Specimen: Urine, Clean Catch  Result Value Ref Range Status   Specimen Description   Final    URINE, CLEAN CATCH Performed at [facility], [address] [place], [place] [address]    Special Requests   Final    NONE Performed at [facility], [address] [place], [place] [address]    Culture >=100,000 COLONIES/mL KLEBSIELLA PNEUMONIAE (A)  Final   Report Status 03/10/2024 FINAL  Final   Organism ID, Bacteria KLEBSIELLA PNEUMONIAE (A)  Final      Susceptibility   Klebsiella pneumoniae - MIC*    AMPICILLIN  >=32 RESISTANT  Resistant     CEFAZOLIN  <=4 SENSITIVE Sensitive     CEFEPIME  <=0.12 SENSITIVE Sensitive     CEFTRIAXONE  <=0.25 SENSITIVE Sensitive     CIPROFLOXACIN  1 RESISTANT Resistant     GENTAMICIN  <=1 SENSITIVE Sensitive     IMIPENEM 0.5 SENSITIVE Sensitive     NITROFURANTOIN 128 RESISTANT Resistant     TRIMETH /SULFA  <=20 SENSITIVE Sensitive     AMPICILLIN /SULBACTAM 8 SENSITIVE Sensitive     PIP/TAZO <=4 SENSITIVE Sensitive ug/mL    * >=100,000 COLONIES/mL KLEBSIELLA PNEUMONIAE     Labs: BNP (last 3 results) No results for input(s): BNP in the last 8760 hours. Basic Metabolic Panel: Recent Labs  Lab 03/08/24 1202 03/09/24 0535 03/10/24 0544  NA 142 141 141  K 3.6 3.4* 3.4*  CL 106 108 108  CO2 22 21* 23  GLUCOSE 85 82 82  BUN 23 27* 19  CREATININE 0.72 0.65 0.58  CALCIUM  9.6 9.5 9.3   Liver Function Tests: Recent Labs  Lab 03/08/24 1202  AST 23  ALT 8  ALKPHOS 109  BILITOT 0.3  PROT 7.3  ALBUMIN 3.7   No results for input(s): LIPASE, AMYLASE in the last 168 hours. Recent Labs  Lab 03/08/24 1202  AMMONIA <13   CBC: Recent Labs  Lab 03/08/24 1202 03/09/24 0535 03/10/24 0544  WBC 7.8 4.9 4.2  NEUTROABS 6.1  --   --   HGB 11.9* 10.8* 11.6*  HCT 37.9 35.2* 37.8  MCV 80.1 82.6 84.0  PLT 156 148* 157   Cardiac Enzymes: No results for input(s): CKTOTAL, CKMB, CKMBINDEX, TROPONINI in the last 168 hours. BNP: Invalid input(s): POCBNP CBG: Recent Labs  Lab 03/08/24 1225  GLUCAP 90   D-Dimer No results for input(s): DDIMER in the last 72 hours. Hgb A1c No results for input(s): HGBA1C in the last 72 hours. Lipid Profile No results for input(s): CHOL, HDL, LDLCALC, TRIG, CHOLHDL, LDLDIRECT in the last 72 hours. Thyroid function studies No results for input(s): TSH, T4TOTAL, T3FREE, THYROIDAB in the last 72 hours.  Invalid input(s): FREET3 Anemia work up No results for input(s): VITAMINB12, FOLATE, FERRITIN,  TIBC, IRON, RETICCTPCT in the last 72 hours. Urinalysis    Component Value Date/Time   COLORURINE YELLOW 03/08/2024 1141   APPEARANCEUR CLOUDY (A) 03/08/2024 1141   LABSPEC 1.025 03/08/2024 1141   PHURINE 7.0 03/08/2024 1141   GLUCOSEU NEGATIVE 03/08/2024 1141   HGBUR LARGE (A) 03/08/2024 1141   BILIRUBINUR NEGATIVE 03/08/2024 1141   KETONESUR 40 (A)  03/08/2024 1141   PROTEINUR >=300 (A) 03/08/2024 1141   UROBILINOGEN 0.2 11/17/2014 1700   NITRITE POSITIVE (A) 03/08/2024 1141   LEUKOCYTESUR SMALL (A) 03/08/2024 1141   Sepsis Labs Recent Labs  Lab 03/08/24 1202 03/09/24 0535 03/10/24 0544  WBC 7.8 4.9 4.2   Microbiology Recent Results (from the past 240 hours)  Urine Culture     Status: Abnormal   Collection Time: 03/08/24 12:02 PM   Specimen: Urine, Clean Catch  Result Value Ref Range Status   Specimen Description   Final    URINE, CLEAN CATCH Performed at [facility], [address] [place], [place] [address]    Special Requests   Final    NONE Performed at [facility], [address] [place], [place] [address]    Culture >=100,000 COLONIES/mL KLEBSIELLA PNEUMONIAE (A)  Final   Report Status 03/10/2024 FINAL  Final   Organism ID, Bacteria KLEBSIELLA PNEUMONIAE (A)  Final      Susceptibility   Klebsiella pneumoniae - MIC*    AMPICILLIN  >=32 RESISTANT Resistant     CEFAZOLIN  <=4 SENSITIVE Sensitive     CEFEPIME  <=0.12 SENSITIVE Sensitive     CEFTRIAXONE  <=0.25 SENSITIVE Sensitive     CIPROFLOXACIN  1 RESISTANT Resistant     GENTAMICIN  <=1 SENSITIVE Sensitive     IMIPENEM 0.5 SENSITIVE Sensitive     NITROFURANTOIN 128 RESISTANT Resistant     TRIMETH /SULFA  <=20 SENSITIVE Sensitive     AMPICILLIN /SULBACTAM 8 SENSITIVE Sensitive     PIP/TAZO <=4 SENSITIVE Sensitive ug/mL    * >=100,000 COLONIES/mL KLEBSIELLA PNEUMONIAE     Time coordinating discharge: Over 30 minutes  SIGNED:   Elsie JAYSON Montclair, DO [facility] 03/10/2024, 7:42 AM Pager   If 7PM-7AM, please contact night-coverage [email or web address]

## 2024-03-27 ENCOUNTER — Other Ambulatory Visit (HOSPITAL_BASED_OUTPATIENT_CLINIC_OR_DEPARTMENT_OTHER): Payer: Self-pay

## 2024-03-27 MED ORDER — AMOXICILLIN-POT CLAVULANATE 875-125 MG PO TABS
1.0000 | ORAL_TABLET | Freq: Two times a day (BID) | ORAL | 0 refills | Status: DC
  Filled 2024-03-27: qty 20, 10d supply, fill #0

## 2024-04-04 ENCOUNTER — Other Ambulatory Visit: Payer: Self-pay | Admitting: Urology

## 2024-04-05 ENCOUNTER — Other Ambulatory Visit (HOSPITAL_COMMUNITY): Payer: Self-pay | Admitting: Urology

## 2024-04-05 DIAGNOSIS — N200 Calculus of kidney: Secondary | ICD-10-CM

## 2024-05-11 NOTE — Progress Notes (Addendum)
 Population Health CCM Navigation Note  Situation: CHESS chronic outreach Background: IP: 3, ED: 1, Hx: kidney stones: is followed by Urology, admissions related to UTIs x2, RSV and pneumonia x1 Assessment: 05/30/24: Spoke with patient who reports overall doing well, is scheduled to have surgery on Monday, 06/05/24 for kidney stone removal. Patient states the pain from the stone has not really bothered me that much. Patient is going to call Urology and ask about a repeat CT prior to having the surgery. Patient continues to work through chronic pain issues. Patient has no concerns at this time but is aware to call HN if needs arise. Recommendation:   HN will follow-up with patient next week after surgery, call HN if any needs arise prior to surgery on Monday, 06/05/24.  General Assessment     None        Electronically signed by: Suzen GORMAN Sharps 05/11/2024 1:44 PM

## 2024-05-25 NOTE — Progress Notes (Addendum)
 COVID Vaccine received:  []  No [x]  Yes Date of any COVID positive Test in last 90 days: no PCP - Dr. Camie Cools  Cardiologist - n/a  Chest x-ray - 03/08/24 Epic EKG -  03/08/24 Epic Stress Test -  ECHO - 06/26/18 Epic Cardiac Cath -   Bowel Prep - [x]  No  []   Yes ______  Pacemaker / ICD device [x]  No []  Yes   Spinal Cord Stimulator:[x]  No []  Yes       History of Sleep Apnea? [x]  No []  Yes   CPAP used?- [x]  No []  Yes    Does the patient monitor blood sugar?          [x]  No []  Yes  []  N/A  Patient has: [x]  NO Hx DM   []  Pre-DM                 []  DM1  []   DM2 Does patient have a [employer] or Dexacom? []  No []  Yes   Fasting Blood Sugar Ranges-  Checks Blood Sugar _____ times a day  GLP1 agonist / usual dose - no GLP1 instructions:  SGLT-2 inhibitors / usual dose - no SGLT-2 instructions:   Blood Thinner / Instructions:no Aspirin  Instructions:no  Comments:   Activity level: Patient is unable to climb a flight of stairs without difficulty; [x]  No CP  [x]  No SOB, but would have _non ambulatory__   Patient can not perform ADLs without assistance.   Anesthesia review:   Patient denies shortness of breath, fever, cough and chest pain at PAT appointment.  Patient verbalized understanding and agreement to the Pre-Surgical Instructions that were given to them at this PAT appointment. Patient was also educated of the need to review these PAT instructions again prior to his/her surgery.I reviewed the appropriate phone numbers to call if they have any and questions or concerns.

## 2024-05-25 NOTE — Patient Instructions (Signed)
 SURGICAL WAITING ROOM VISITATION  Patients having surgery or a procedure may have no more than 2 support people in the waiting area - these visitors may rotate.    Children under the age of 59 must have an adult with them who is not the patient.  Visitors with respiratory illnesses are discouraged from visiting and should remain at home.  If the patient needs to stay at the hospital during part of their recovery, the visitor guidelines for inpatient rooms apply. Pre-op nurse will coordinate an appropriate time for 1 support person to accompany patient in pre-op.  This support person may not rotate.    Please refer to the [facility] website for the visitor guidelines for Inpatients (after your surgery is over and you are in a regular room).       Your procedure is scheduled on: 06/05/24   Report to [facility] Main Entrance    Report to admitting at 7:30 AM   Call this number if you have problems the morning of surgery [phone]   Do not eat food or drink liquids :After Midnight.but may have sips of water  to take meds.      Oral Hygiene is also important to reduce your risk of infection.                                    Remember - BRUSH YOUR TEETH THE MORNING OF SURGERY WITH YOUR REGULAR TOOTHPASTE  DENTURES WILL BE REMOVED PRIOR TO SURGERY PLEASE DO NOT APPLY Poly grip OR ADHESIVES!!!   Stop all vitamins and herbal supplements 7 days before surgery.   Take these medicines the morning of surgery with A SIP OF WATER : Omeprazole, eye drops, Tylenol  if needed.             You may not have any metal on your body including hair pins, jewelry, and body piercing             Do not wear make-up, lotions, powders, perfumes/cologne, or deodorant  Do not wear nail polish including gel and S&S, artificial/acrylic nails, or any other type of covering on natural nails including finger and toenails. If you have artificial nails, gel coating, etc. that needs to be removed by a  nail salon please have this removed prior to surgery or surgery may need to be canceled/ delayed if the surgeon/ anesthesia feels like they are unable to be safely monitored.   Do not shave  48 hours prior to surgery.    Do not bring valuables to the hospital. [facility] IS NOT             RESPONSIBLE   FOR VALUABLES.   Contacts, glasses, dentures or bridgework may not be worn into surgery.   Bring small overnight bag day of surgery.   DO NOT BRING YOUR HOME MEDICATIONS TO THE HOSPITAL. PHARMACY WILL DISPENSE MEDICATIONS LISTED ON YOUR MEDICATION LIST TO YOU DURING YOUR ADMISSION IN THE HOSPITAL!    Patients discharged on the day of surgery will not be allowed to drive home.  Someone NEEDS to stay with you for the first 24 hours after anesthesia.   Special Instructions: Bring a copy of your healthcare power of attorney and living will documents the day of surgery if you haven't scanned them before.              Please read over the following fact sheets you  were given: IF YOU HAVE QUESTIONS ABOUT YOUR PRE-OP INSTRUCTIONS PLEASE CALL [phone] [place]   If you received a COVID test during your pre-op visit  it is requested that you wear a mask when out in public, stay away from anyone that may not be feeling well and notify your surgeon if you develop symptoms. If you test positive for Covid or have been in contact with anyone that has tested positive in the last 10 days please notify you surgeon.    [facility] - Preparing for Surgery Before surgery, you can play an important role.  Because skin is not sterile, your skin needs to be as free of germs as possible.  You can reduce the number of germs on your skin by washing with CHG (chlorahexidine gluconate) soap before surgery.  CHG is an antiseptic cleaner which kills germs and bonds with the skin to continue killing germs even after washing. Please DO NOT use if you have an allergy to CHG or antibacterial soaps.  If your skin becomes  reddened/irritated stop using the CHG and inform your nurse when you arrive at Short Stay. Do not shave (including legs and underarms) for at least 48 hours prior to the first CHG shower.  You may shave your face/neck.  Please follow these instructions carefully:  1.  Shower with CHG Soap the night before surgery and the  morning of surgery.  2.  If you choose to wash your hair, wash your hair first as usual with your normal  shampoo.  3.  After you shampoo, rinse your hair and body thoroughly to remove the shampoo.                             4.  Use CHG as you would any other liquid soap.  You can apply chg directly to the skin and wash.  Gently with a scrungie or clean washcloth.  5.  Apply the CHG Soap to your body ONLY FROM THE NECK DOWN.   Do   not use on face/ open                           Wound or open sores. Avoid contact with eyes, ears mouth and   genitals (private parts).                       Wash face,  Genitals (private parts) with your normal soap.             6.  Wash thoroughly, paying special attention to the area where your    surgery  will be performed.  7.  Thoroughly rinse your body with warm water  from the neck down.  8.  DO NOT shower/wash with your normal soap after using and rinsing off the CHG Soap.                9.  Pat yourself dry with a clean towel.            10.  Wear clean pajamas.            11.  Place clean sheets on your bed the night of your first shower and do not  sleep with pets. Day of Surgery : Do not apply any lotions/deodorants the morning of surgery.  Please wear clean clothes to the hospital/surgery center.  FAILURE TO FOLLOW THESE INSTRUCTIONS MAY RESULT  IN THE CANCELLATION OF YOUR SURGERY  PATIENT SIGNATURE_________________________________  NURSE SIGNATURE__________________________________  ________________________________________________________________________

## 2024-05-29 ENCOUNTER — Encounter (HOSPITAL_COMMUNITY): Payer: Self-pay | Admitting: Medical

## 2024-05-29 ENCOUNTER — Other Ambulatory Visit: Payer: Self-pay

## 2024-05-29 ENCOUNTER — Encounter (HOSPITAL_COMMUNITY)
Admission: RE | Admit: 2024-05-29 | Discharge: 2024-05-29 | Disposition: A | Discharge status: Home / Self Care | Source: Ambulatory Visit | Attending: Urology | Admitting: Urology

## 2024-05-29 VITALS — BP 134/74 | HR 72 | Temp 97.9°F | Resp 16 | Ht 63.0 in | Wt 132.0 lb

## 2024-05-29 DIAGNOSIS — Z01812 Encounter for preprocedural laboratory examination: Secondary | ICD-10-CM | POA: Diagnosis not present

## 2024-05-29 DIAGNOSIS — Z01818 Encounter for other preprocedural examination: Secondary | ICD-10-CM

## 2024-05-29 LAB — CBC
HCT: 39.3 % (ref 36.0–46.0)
Hemoglobin: 11.7 g/dL — ABNORMAL LOW (ref 12.0–15.0)
MCH: 25.4 pg — ABNORMAL LOW (ref 26.0–34.0)
MCHC: 29.8 g/dL — ABNORMAL LOW (ref 30.0–36.0)
MCV: 85.2 fL (ref 80.0–100.0)
Platelets: 223 K/uL (ref 150–400)
RBC: 4.61 MIL/uL (ref 3.87–5.11)
RDW: 19.2 % — ABNORMAL HIGH (ref 11.5–15.5)
WBC: 4.6 K/uL (ref 4.0–10.5)
nRBC: 0 % (ref 0.0–0.2)

## 2024-06-01 ENCOUNTER — Other Ambulatory Visit: Payer: Self-pay | Admitting: Radiology

## 2024-06-01 DIAGNOSIS — N200 Calculus of kidney: Secondary | ICD-10-CM

## 2024-06-05 ENCOUNTER — Encounter (HOSPITAL_COMMUNITY): Payer: Self-pay

## 2024-06-05 ENCOUNTER — Ambulatory Visit (HOSPITAL_COMMUNITY)

## 2024-06-05 ENCOUNTER — Ambulatory Visit (HOSPITAL_COMMUNITY): Admission: RE | Admit: 2024-06-05 | Source: Home / Self Care | Admitting: Urology

## 2024-06-05 ENCOUNTER — Encounter (HOSPITAL_COMMUNITY): Admission: RE | Payer: Self-pay | Source: Home / Self Care

## 2024-06-05 ENCOUNTER — Other Ambulatory Visit (HOSPITAL_COMMUNITY)

## 2024-06-05 SURGERY — NEPHROLITHOTOMY PERCUTANEOUS
Anesthesia: General | Laterality: Right

## 2024-06-21 ENCOUNTER — Ambulatory Visit (HOSPITAL_COMMUNITY): Admit: 2024-06-21 | Admitting: Urology

## 2024-06-21 SURGERY — REMOVAL, STENT, URETER, CYSTOSCOPIC
Anesthesia: Choice | Laterality: Right

## 2024-09-01 ENCOUNTER — Other Ambulatory Visit: Payer: Self-pay | Admitting: Urology

## 2024-10-15 NOTE — Patient Instructions (Signed)
 SURGICAL WAITING ROOM VISITATION Patients having surgery or a procedure may have no more than 2 support people in the waiting area - these visitors may rotate in the visitor waiting room.   If the patient needs to stay at the hospital during part of their recovery, the visitor guidelines for inpatient rooms apply.  PRE-OP VISITATION  Pre-op nurse will coordinate an appropriate time for 1 support person to accompany the patient in pre-op.  This support person may not rotate.  This visitor will be contacted when the time is appropriate for the visitor to come back in the pre-op area.  To keep our patients, visitors and teammates safe and prevent the spread of respiratory illnesses over the next few months.  Temporary Visitor Restrictions  Children ages 63 and under will not be able to visit patients in [facility] under most circumstances. Visitation is not restricted outside of hospitals unless noted otherwise in the [facility] and Location Specific Visitation Guidelines at:       [email or web address]  Visitors with respiratory illnesses are discouraged from visiting and should remain at home. You are not required to quarantine at this time prior to your surgery. However, you must do this: Hand Hygiene often Do NOT share personal items Notify your provider if you are in close contact with someone who has COVID or you develop fever 100.4 or greater, new onset of sneezing, cough, sore throat, shortness of breath or body aches.  If you test positive for Covid or have been in contact with anyone that has tested positive in the last 10 days please notify you surgeon.    Your procedure is scheduled on:  MONDAY  10-23-2024  Report to [facility] Main Entrance: [place] entrance where the [employer] is available.   Report to admitting at:   07:30  AM  Call this number if you have any questions or problems the morning of surgery [phone]  DO NOT EAT OR DRINK ANYTHING AFTER  MIDNIGHT THE NIGHT PRIOR TO YOUR SURGERY / PROCEDURE.   FOLLOW  ANY ADDITIONAL PRE OP INSTRUCTIONS YOU RECEIVED FROM YOUR SURGEON'S OFFICE!!!   Oral Hygiene is also important to reduce your risk of infection.        Remember - BRUSH YOUR TEETH THE MORNING OF SURGERY WITH YOUR REGULAR TOOTHPASTE  Do NOT smoke after Midnight the night before surgery.  STOP TAKING all Vitamins, Herbs and supplements 1 week before your surgery.   Take ONLY these medicines the morning of surgery with A SIP OF WATER : Tylenol  and Omeprazole (PRILOSEC) if needed.  You may use your Eye drops if needed.               You may not have any metal on your body including hair pins, jewelry, and body piercing  Do not wear make-up, lotions, powders, perfumes or deodorant  Do not wear nail polish including gel and S&S, artificial / acrylic nails, or any other type of covering on natural nails including finger and toenails. If you have artificial nails, gel coating, etc., that needs to be removed by a nail salon, Please have this removed prior to surgery. Not doing so may mean that your surgery could be cancelled or delayed if the Surgeon or anesthesia staff feels like they are unable to monitor you safely.   Do not shave 48 hours prior to surgery to avoid nicks in your skin which may contribute to postoperative infections.   Contacts, Hearing Aids, dentures or bridgework may  not be worn into surgery. DENTURES WILL BE REMOVED PRIOR TO SURGERY PLEASE DO NOT APPLY Poly grip OR ADHESIVES!!!  You may bring a small overnight bag with you on the day of surgery, only pack items that are not valuable. [facility] IS NOT RESPONSIBLE   FOR VALUABLES THAT ARE LOST OR STOLEN.   Do not bring your home medications to the hospital. The Pharmacy will dispense medications listed on your medication list to you during your admission in the Hospital.  Special Instructions: Bring a copy of your healthcare power of attorney and living will  documents the day of surgery, if you wish to have them scanned into your [facility] Records- EPIC  Please read over the following fact sheets you were given: IF YOU HAVE QUESTIONS ABOUT YOUR PRE-OP INSTRUCTIONS, PLEASE CALL [phone].   [facility] - Preparing for Surgery         Before surgery, you can play an important role.  Because skin is not sterile, your skin needs to be as free of germs as possible.  You can reduce the number of germs on your skin by washing with CHG (chlorahexidine gluconate) soap before surgery.  CHG is an antiseptic cleaner which kills germs and bonds with the skin to continue killing germs even after washing. Please DO NOT use if you have an allergy to CHG or antibacterial soaps.  If your skin becomes reddened/irritated stop using the CHG and inform your nurse when you arrive at Short Stay. Do not shave (including legs and underarms) for at least 48 hours prior to the first CHG shower.  You may shave your face/neck.  Please follow these instructions carefully:  1.  Shower with CHG Soap the night before surgery ONLY (DO NOT USE THE CHG SOAP THE MORNING OF SURGERY).  2.  If you choose to wash your hair, wash your hair first as usual with your normal  shampoo.  3.  After you shampoo, rinse your hair and body thoroughly to remove the shampoo.                             4.  Use CHG as you would any other liquid soap.  You can apply chg directly to the skin and wash.  Gently with a scrungie or clean washcloth.  5.  Apply the CHG Soap to your body ONLY FROM THE NECK DOWN.   Do not use on face/ open                           Wound or open sores. Avoid contact with eyes, ears mouth and genitals (private parts).                       Wash face,  Genitals (private parts) with your normal soap.             6.  Wash thoroughly, paying special attention to the area where your  surgery  will be performed.  7.  Thoroughly rinse your body with warm water  from the neck  down.  8.  DO NOT shower/wash with your normal soap after using and rinsing off the CHG Soap.                9.  Pat yourself dry with a clean towel.            10.  Wear  clean pajamas.            11.  Place clean sheets on your bed the night of your first shower and do not  sleep with pets.  Day of Surgery : Do not apply any CHG, lotions/deodorants the morning of surgery.  Please wear clean clothes to the hospital/surgery center.   FAILURE TO FOLLOW THESE INSTRUCTIONS MAY RESULT IN THE CANCELLATION OF YOUR SURGERY  PATIENT SIGNATURE_________________________________  NURSE SIGNATURE__________________________________  ________________________________________________________________________

## 2024-10-15 NOTE — Progress Notes (Signed)
 The patient was identified using 2 approved identifiers. All issues noted in this document were discussed and addressed,  Ms            voiced understanding and agreement with all preoperative instructions.   The patient was emailed the surgery instructions per his / her request.     The patient was instructed to call our Pharmacy [phone]) and the Admitting Office [phone] or [phone]) to complete their Pre-surgical Interview.   Medication Reconciliation: 10-06-2024    []   Verified w/ pt  The patient will contact our Admitting office at [phone] or [phone]) to complete their Pre-surgical Interview.     Date of any COVID positive Test in last 90 days:  PCP - Camie Cools, MD Cardiologist - Debby Manner, MD (LOV 05-15-2022 at [facility])  Chest x-ray - 03-08-2024  1v  EKG -  03-08-2024 Stress Test - 04-30-2022 ECHO - TEE 05-15-2022 Cardiac Cath -  CT Coronary Calcium  score:  ZIO monitor- 05-15-2022 at [facility]   Pacemaker / ICD device [x]  No []  Yes   Spinal Cord Stimulator:[x]  No []  Yes       History of Sleep Apnea? [x]  No []  Yes   CPAP used?- [x]  No []  Yes    Medication on DOS: Tylenol  and omeprazole (PRILOSEC), eye drops,   Patient has: [x]  NO Hx DM   []  Pre-DM   []  DM1  []   DM2 Does the patient monitor blood sugar?   []  N/A   []  No []  Yes   Blood Thinner / Instructions: Aspirin  Instructions:  Activity level: Able to walk up 2 flights of stairs without becoming significantly short of breath or having chest pain?   [x]    Yes   []  No,  would have: nonambulatory Patient can perform ADLs without assistance.  []   Yes    [x]  No   Comments: has Multiple sclerosis- spasticity  Anesthesia review: CAD- LBBB-LVH, legally blind, anemia, GERD, glaucoma  Patient denies any S&S of respiratory illness or Covid - no shortness of breath, fever, cough or chest pain at PAT appointment.

## 2024-10-16 ENCOUNTER — Encounter (HOSPITAL_COMMUNITY): Admission: RE | Admit: 2024-10-16 | Source: Ambulatory Visit

## 2024-10-16 ENCOUNTER — Encounter (HOSPITAL_COMMUNITY): Payer: Self-pay

## 2024-10-16 DIAGNOSIS — Z01818 Encounter for other preprocedural examination: Secondary | ICD-10-CM

## 2024-10-16 DIAGNOSIS — N200 Calculus of kidney: Secondary | ICD-10-CM

## 2024-10-16 DIAGNOSIS — I2510 Atherosclerotic heart disease of native coronary artery without angina pectoris: Secondary | ICD-10-CM

## 2024-10-18 NOTE — Anesthesia Preprocedure Evaluation (Signed)
"                                    Anesthesia Evaluation    Airway        Dental   Pulmonary           Cardiovascular      Neuro/Psych    GI/Hepatic   Endo/Other    Renal/GU      Musculoskeletal   Abdominal   Peds  Hematology   Anesthesia Other Findings   Reproductive/Obstetrics                              Anesthesia Physical Anesthesia Plan  ASA:   Anesthesia Plan:    Post-op Pain Management:    Induction:   PONV Risk Score and Plan:   Airway Management Planned:   Additional Equipment:   Intra-op Plan:   Post-operative Plan:   Informed Consent:   Plan Discussed with:   Anesthesia Plan Comments: (See PAT note from 2/2)         Anesthesia Quick Evaluation  "

## 2024-10-18 NOTE — Progress Notes (Signed)
 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  She reported shortness of breath and was advised to undergo stress testing and an echo.  Stress test done on 04/30/2022 was  uneventful.  Echo done on 05/15/2022 showed normal LVEF, moderate LVH, no significant valve disease.  Patient with history of MS diagnosed in 2s. She has been in a wheelchair since 2018. Also has optic neuritis and is legally blind. Last seen by Dr. Vear with Neurology in 2022. 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  Unfortunately, immunomodulatory therapy has negligible benefit late in the disease course of the mass when a person is experiencing slow neurodegenerative processes without new activity or relapses..  Therefore she will remain off of a disease modifying therapy.    VS:  Wt Readings from Last 3 Encounters:  05/29/24 59.9 kg  03/08/24 60.6 kg  12/25/23 60.1 kg   Temp Readings from Last 3 Encounters:  05/29/24 36.6 C (Oral)  03/10/24 36.5 C (Oral)  12/26/23 36.5 C   BP Readings from Last 3 Encounters:  05/29/24 134/74  03/10/24 138/73  12/26/23 (!) 141/85   Pulse Readings from Last 3 Encounters:  05/29/24 72  03/10/24 75  12/26/23 95     PROVIDERS: Deane Camie HERO., MD   LABS: Ordered for DOS    EKG 03/08/2024:  Sinus tachycardia, rate 101 Borderline left axis deviation Abnormal TTE, consider ischemia, lateral leads  Echo 05/15/2022  ([facility]):  SUMMARY There is moderate concentric left ventricular hypertrophy. Left ventricular systolic function is normal. LV ejection fraction = 65-70%. There is no comparison study available.  Nuclear Stress test 04/30/2022 ([facility]):  Impression  Uneventful Lexiscan/stress. Past Medical History:  Diagnosis Date   Anemia    Blindness    Dysrhythmia    GERD (gastroesophageal reflux disease)    Glaucoma    Headache    History of kidney stones    Legally blind 2018   Multiple sclerosis    Osteoporosis    Pelvic rash    Pneumonia     Past Surgical History:  Procedure Laterality Date   ABDOMINAL HYSTERECTOMY     CATARACT EXTRACTION Left    COLONOSCOPY N/A 11/19/2023   Procedure: COLONOSCOPY;  Surgeon: Shila Gustav GAILS, MD;  Location: [facility] ENDOSCOPY;  Service: Gastroenterology;  Laterality: N/A;   CYSTOSCOPY WITH RETROGRADE PYELOGRAM, URETEROSCOPY AND STENT PLACEMENT Left 08/01/2018   Procedure: CYSTOSCOPY WITH RETROGRADE PYELOGRAM, URETEROSCOPY, HOLMIUM LASER AND STENT PLACEMENT;  Surgeon: Carolee Sherwood JONETTA DOUGLAS, MD;  Location: [facility] OR[facility];  Service: Urology;  Laterality: Left;   CYSTOSCOPY WITH RETROGRADE PYELOGRAM, URETEROSCOPY AND STENT PLACEMENT Bilateral 06/01/2022   Procedure: CYSTOSCOPY WITH BILATERAL RETROGRADE PYELOGRAM, RIGHT URETEROSCOPY WITH HOLMIUM LASER ,STONE BASKETRY AND STENT  PLACEMENT, LEFT DIAGNOSTIC URETEROSCOPY AND STENT PLACEMENT;  Surgeon: Carolee Sherwood JONETTA DOUGLAS, MD;  Location: [facility];  Service: Urology;  Laterality: Bilateral;   CYSTOSCOPY/URETEROSCOPY/HOLMIUM LASER/STENT PLACEMENT Bilateral 06/22/2022   Procedure: CYSTOSCOPY RIGHT URETERAL STENT REMOVAL LEFT URETEROSCOPY/HOLMIUM LASER/STENT PLACEMENT;  Surgeon:  Carolee Sherwood JONETTA DOUGLAS, MD;  Location: [facility];  Service: Urology;  Laterality: Bilateral;   ESOPHAGOGASTRODUODENOSCOPY N/A 11/06/2012   Procedure: ESOPHAGOGASTRODUODENOSCOPY (EGD);  Surgeon: Jerrell KYM Sol, MD;  Location: [facility] ENDOSCOPY;  Service: Endoscopy;   Laterality: N/A;   ESOPHAGOGASTRODUODENOSCOPY N/A 11/19/2023   Procedure: EGD (ESOPHAGOGASTRODUODENOSCOPY);  Surgeon: Nandigam, Kavitha V, MD;  Location: [facility] ENDOSCOPY;  Service: Gastroenterology;  Laterality: N/A;   KIDNEY STONE SURGERY     TRANSURETHRAL RESECTION OF BLADDER TUMOR N/A 06/01/2022   Procedure: BLADDER BIOPSY  ;  Surgeon: Carolee Sherwood JONETTA DOUGLAS, MD;  Location: [facility];  Service: Urology;  Laterality: N/A;  90 MINS FOR CASE    MEDICATIONS:  acetaminophen  (TYLENOL ) 500 MG tablet   alum hydroxide-mag trisilicate (GAVISCON) 80-20 MG CHEW chewable tablet   B Complex-C (B-COMPLEX WITH VITAMIN C) tablet   baclofen  (LIORESAL ) 10 MG tablet   CALCIUM  MAGNESIUM  ZINC PO   Carboxymethylcell-Glycerin PF (REFRESH RELIEVA PF) 0.5-0.9 % SOLN   cetirizine (ZYRTEC) 10 MG tablet   diphenhydrAMINE  (BENADRYL ) 25 MG tablet   diphenhydramine -acetaminophen  (TYLENOL  PM) 25-500 MG TABS tablet   ELDERBERRY PO   gabapentin  (NEURONTIN ) 300 MG capsule   HYDROCORTISONE EX   lactase (LACTAID) 3000 units tablet   Lactobacillus (AZO DUAL PROTECTION PO)   Lidocaine -Aloe Vera (ALOE VERA/LIDOCAINE  EX)   loperamide (IMODIUM) 2 MG capsule   Methenamine-Sodium Salicylate (AZO URINARY TRACT DEFENSE PO)   Multiple Vitamins-Minerals (AIRBORNE) CHEW   omeprazole (PRILOSEC) 40 MG capsule   phenylephrine  (SUDAFED PE) 10 MG TABS tablet   saccharomyces boulardii (FLORASTOR) 250 MG capsule   simethicone (MYLICON) 80 MG chewable tablet   tetrahydrozoline-zinc (VISINE-AC) 0.05-0.25 % ophthalmic solution   triamcinolone (KENALOG) 0.025 % cream   Vitamin D -Vitamin K (D3 + K2 PO)   No current facility-administered medications for this encounter.   Burnard CHRISTELLA Odis DEVONNA [facility] Surgical Short Stay/Anesthesiology [facility] Phone [phone] 10/18/2024 9:09 AM        "

## 2024-10-20 ENCOUNTER — Other Ambulatory Visit: Payer: Self-pay

## 2024-10-23 ENCOUNTER — Encounter (HOSPITAL_COMMUNITY): Payer: Self-pay | Admitting: Medical

## 2024-10-23 ENCOUNTER — Other Ambulatory Visit (HOSPITAL_COMMUNITY)

## 2024-10-23 ENCOUNTER — Ambulatory Visit (HOSPITAL_COMMUNITY)

## 2024-10-23 ENCOUNTER — Ambulatory Visit (HOSPITAL_COMMUNITY): Admission: RE | Admit: 2024-10-23 | Source: Home / Self Care | Admitting: Urology

## 2024-10-23 ENCOUNTER — Encounter (HOSPITAL_COMMUNITY): Admission: RE | Payer: Self-pay | Source: Home / Self Care

## 2024-10-23 DIAGNOSIS — Z01818 Encounter for other preprocedural examination: Secondary | ICD-10-CM
